# Patient Record
Sex: Female | Born: 1945
Health system: Southern US, Community
[De-identification: ages and names within clinical notes are randomized; demographics above are authoritative.]

## PROBLEM LIST (undated history)

## (undated) DIAGNOSIS — E871 Hypo-osmolality and hyponatremia: Secondary | ICD-10-CM

## (undated) DIAGNOSIS — M1990 Unspecified osteoarthritis, unspecified site: Secondary | ICD-10-CM

## (undated) DIAGNOSIS — F419 Anxiety disorder, unspecified: Secondary | ICD-10-CM

## (undated) DIAGNOSIS — Z9229 Personal history of other drug therapy: Secondary | ICD-10-CM

## (undated) DIAGNOSIS — H409 Unspecified glaucoma: Secondary | ICD-10-CM

## (undated) DIAGNOSIS — M810 Age-related osteoporosis without current pathological fracture: Secondary | ICD-10-CM

## (undated) DIAGNOSIS — I341 Nonrheumatic mitral (valve) prolapse: Secondary | ICD-10-CM

## (undated) DIAGNOSIS — K635 Polyp of colon: Secondary | ICD-10-CM

## (undated) DIAGNOSIS — C4491 Basal cell carcinoma of skin, unspecified: Secondary | ICD-10-CM

## (undated) DIAGNOSIS — J189 Pneumonia, unspecified organism: Secondary | ICD-10-CM

## (undated) DIAGNOSIS — D649 Anemia, unspecified: Secondary | ICD-10-CM

## (undated) DIAGNOSIS — M353 Polymyalgia rheumatica: Secondary | ICD-10-CM

## (undated) DIAGNOSIS — C50919 Malignant neoplasm of unspecified site of unspecified female breast: Secondary | ICD-10-CM

## (undated) DIAGNOSIS — E785 Hyperlipidemia, unspecified: Secondary | ICD-10-CM

## (undated) DIAGNOSIS — F1090 Alcohol use, unspecified, uncomplicated: Secondary | ICD-10-CM

## (undated) DIAGNOSIS — Z789 Other specified health status: Secondary | ICD-10-CM

## (undated) DIAGNOSIS — J45909 Unspecified asthma, uncomplicated: Secondary | ICD-10-CM

## (undated) DIAGNOSIS — IMO0002 Reserved for concepts with insufficient information to code with codable children: Secondary | ICD-10-CM

## (undated) DIAGNOSIS — K219 Gastro-esophageal reflux disease without esophagitis: Secondary | ICD-10-CM

## (undated) DIAGNOSIS — I201 Angina pectoris with documented spasm: Secondary | ICD-10-CM

## (undated) DIAGNOSIS — Z7289 Other problems related to lifestyle: Secondary | ICD-10-CM

## (undated) DIAGNOSIS — Z923 Personal history of irradiation: Secondary | ICD-10-CM

## (undated) DIAGNOSIS — I10 Essential (primary) hypertension: Secondary | ICD-10-CM

## (undated) HISTORY — DX: Unspecified asthma, uncomplicated: J45.909

## (undated) HISTORY — DX: Other specified health status: Z78.9

## (undated) HISTORY — DX: Hypo-osmolality and hyponatremia: E87.1

## (undated) HISTORY — DX: Anemia, unspecified: D64.9

## (undated) HISTORY — DX: Unspecified osteoarthritis, unspecified site: M19.90

## (undated) HISTORY — DX: Pneumonia, unspecified organism: J18.9

## (undated) HISTORY — DX: Angina pectoris with documented spasm: I20.1

## (undated) HISTORY — DX: Malignant neoplasm of unspecified site of unspecified female breast: C50.919

## (undated) HISTORY — DX: Polyp of colon: K63.5

## (undated) HISTORY — DX: Basal cell carcinoma of skin, unspecified: C44.91

## (undated) HISTORY — DX: Unspecified glaucoma: H40.9

## (undated) HISTORY — DX: Anxiety disorder, unspecified: F41.9

## (undated) HISTORY — DX: Reserved for concepts with insufficient information to code with codable children: IMO0002

## (undated) HISTORY — PX: HYSTEROTOMY: SHX1776

## (undated) HISTORY — DX: Alcohol use, unspecified, uncomplicated: F10.90

## (undated) HISTORY — DX: Personal history of other drug therapy: Z92.29

## (undated) HISTORY — DX: Age-related osteoporosis without current pathological fracture: M81.0

## (undated) HISTORY — PX: ABDOMINAL HYSTERECTOMY: SHX81

## (undated) HISTORY — DX: Nonrheumatic mitral (valve) prolapse: I34.1

## (undated) HISTORY — DX: Gastro-esophageal reflux disease without esophagitis: K21.9

## (undated) HISTORY — PX: POLYPECTOMY: SHX149

## (undated) HISTORY — DX: Hyperlipidemia, unspecified: E78.5

## (undated) HISTORY — DX: Essential (primary) hypertension: I10

## (undated) HISTORY — DX: Polymyalgia rheumatica: M35.3

## (undated) HISTORY — PX: LYMPH NODE DISSECTION: SHX5087

## (undated) HISTORY — DX: Other problems related to lifestyle: Z72.89

---

## 1996-01-25 DIAGNOSIS — Z923 Personal history of irradiation: Secondary | ICD-10-CM

## 1996-01-25 DIAGNOSIS — C50919 Malignant neoplasm of unspecified site of unspecified female breast: Secondary | ICD-10-CM

## 1996-01-25 DIAGNOSIS — C50512 Malignant neoplasm of lower-outer quadrant of left female breast: Secondary | ICD-10-CM | POA: Insufficient documentation

## 1996-01-25 HISTORY — PX: BREAST LUMPECTOMY: SHX2

## 1996-01-25 HISTORY — DX: Personal history of irradiation: Z92.3

## 1996-01-25 HISTORY — DX: Malignant neoplasm of unspecified site of unspecified female breast: C50.919

## 1996-01-25 HISTORY — DX: Malignant neoplasm of lower-outer quadrant of left female breast: C50.512

## 2003-01-25 HISTORY — PX: HERNIA REPAIR: SHX51

## 2012-07-24 HISTORY — PX: COLONOSCOPY: SHX174

## 2013-01-24 DIAGNOSIS — M1990 Unspecified osteoarthritis, unspecified site: Secondary | ICD-10-CM | POA: Insufficient documentation

## 2014-02-06 DIAGNOSIS — F328 Other depressive episodes: Secondary | ICD-10-CM | POA: Diagnosis not present

## 2014-02-06 DIAGNOSIS — F411 Generalized anxiety disorder: Secondary | ICD-10-CM | POA: Diagnosis not present

## 2014-02-19 DIAGNOSIS — R609 Edema, unspecified: Secondary | ICD-10-CM | POA: Diagnosis not present

## 2014-02-19 DIAGNOSIS — E780 Pure hypercholesterolemia: Secondary | ICD-10-CM | POA: Diagnosis not present

## 2014-02-20 DIAGNOSIS — F411 Generalized anxiety disorder: Secondary | ICD-10-CM | POA: Diagnosis not present

## 2014-02-20 DIAGNOSIS — F328 Other depressive episodes: Secondary | ICD-10-CM | POA: Diagnosis not present

## 2014-02-27 DIAGNOSIS — F411 Generalized anxiety disorder: Secondary | ICD-10-CM | POA: Diagnosis not present

## 2014-02-27 DIAGNOSIS — F328 Other depressive episodes: Secondary | ICD-10-CM | POA: Diagnosis not present

## 2014-03-05 DIAGNOSIS — E780 Pure hypercholesterolemia: Secondary | ICD-10-CM | POA: Diagnosis not present

## 2014-03-05 DIAGNOSIS — K219 Gastro-esophageal reflux disease without esophagitis: Secondary | ICD-10-CM | POA: Diagnosis not present

## 2014-03-05 DIAGNOSIS — R600 Localized edema: Secondary | ICD-10-CM | POA: Diagnosis not present

## 2014-03-05 DIAGNOSIS — I10 Essential (primary) hypertension: Secondary | ICD-10-CM | POA: Diagnosis not present

## 2014-03-12 DIAGNOSIS — R490 Dysphonia: Secondary | ICD-10-CM | POA: Diagnosis not present

## 2014-03-12 DIAGNOSIS — K219 Gastro-esophageal reflux disease without esophagitis: Secondary | ICD-10-CM | POA: Diagnosis not present

## 2014-03-12 DIAGNOSIS — L438 Other lichen planus: Secondary | ICD-10-CM | POA: Diagnosis not present

## 2014-03-12 DIAGNOSIS — D485 Neoplasm of uncertain behavior of skin: Secondary | ICD-10-CM | POA: Diagnosis not present

## 2014-03-12 DIAGNOSIS — R51 Headache: Secondary | ICD-10-CM | POA: Diagnosis not present

## 2014-03-12 DIAGNOSIS — J310 Chronic rhinitis: Secondary | ICD-10-CM | POA: Diagnosis not present

## 2014-03-13 DIAGNOSIS — F411 Generalized anxiety disorder: Secondary | ICD-10-CM | POA: Diagnosis not present

## 2014-03-13 DIAGNOSIS — F328 Other depressive episodes: Secondary | ICD-10-CM | POA: Diagnosis not present

## 2014-03-17 DIAGNOSIS — H40003 Preglaucoma, unspecified, bilateral: Secondary | ICD-10-CM | POA: Diagnosis not present

## 2014-03-17 DIAGNOSIS — Z961 Presence of intraocular lens: Secondary | ICD-10-CM | POA: Diagnosis not present

## 2014-03-27 DIAGNOSIS — M818 Other osteoporosis without current pathological fracture: Secondary | ICD-10-CM | POA: Diagnosis not present

## 2014-03-28 DIAGNOSIS — F411 Generalized anxiety disorder: Secondary | ICD-10-CM | POA: Diagnosis not present

## 2014-03-28 DIAGNOSIS — F328 Other depressive episodes: Secondary | ICD-10-CM | POA: Diagnosis not present

## 2014-04-09 DIAGNOSIS — M818 Other osteoporosis without current pathological fracture: Secondary | ICD-10-CM | POA: Diagnosis not present

## 2014-04-11 DIAGNOSIS — Z01419 Encounter for gynecological examination (general) (routine) without abnormal findings: Secondary | ICD-10-CM | POA: Diagnosis not present

## 2014-04-11 DIAGNOSIS — N952 Postmenopausal atrophic vaginitis: Secondary | ICD-10-CM | POA: Diagnosis not present

## 2014-04-11 DIAGNOSIS — R350 Frequency of micturition: Secondary | ICD-10-CM | POA: Diagnosis not present

## 2014-04-11 DIAGNOSIS — Z853 Personal history of malignant neoplasm of breast: Secondary | ICD-10-CM | POA: Diagnosis not present

## 2014-04-28 DIAGNOSIS — F328 Other depressive episodes: Secondary | ICD-10-CM | POA: Diagnosis not present

## 2014-04-28 DIAGNOSIS — F411 Generalized anxiety disorder: Secondary | ICD-10-CM | POA: Diagnosis not present

## 2014-05-01 DIAGNOSIS — K219 Gastro-esophageal reflux disease without esophagitis: Secondary | ICD-10-CM | POA: Diagnosis not present

## 2014-05-01 DIAGNOSIS — M150 Primary generalized (osteo)arthritis: Secondary | ICD-10-CM | POA: Diagnosis not present

## 2014-05-01 DIAGNOSIS — I10 Essential (primary) hypertension: Secondary | ICD-10-CM | POA: Diagnosis not present

## 2014-05-01 DIAGNOSIS — M0600 Rheumatoid arthritis without rheumatoid factor, unspecified site: Secondary | ICD-10-CM | POA: Diagnosis not present

## 2014-05-27 DIAGNOSIS — F328 Other depressive episodes: Secondary | ICD-10-CM | POA: Diagnosis not present

## 2014-05-27 DIAGNOSIS — F411 Generalized anxiety disorder: Secondary | ICD-10-CM | POA: Diagnosis not present

## 2014-06-24 DIAGNOSIS — Z853 Personal history of malignant neoplasm of breast: Secondary | ICD-10-CM | POA: Diagnosis not present

## 2014-07-01 DIAGNOSIS — I341 Nonrheumatic mitral (valve) prolapse: Secondary | ICD-10-CM | POA: Diagnosis not present

## 2014-07-01 DIAGNOSIS — E782 Mixed hyperlipidemia: Secondary | ICD-10-CM | POA: Diagnosis not present

## 2014-07-01 DIAGNOSIS — I10 Essential (primary) hypertension: Secondary | ICD-10-CM | POA: Diagnosis not present

## 2014-07-02 DIAGNOSIS — F411 Generalized anxiety disorder: Secondary | ICD-10-CM | POA: Diagnosis not present

## 2014-07-02 DIAGNOSIS — F328 Other depressive episodes: Secondary | ICD-10-CM | POA: Diagnosis not present

## 2014-08-04 DIAGNOSIS — D485 Neoplasm of uncertain behavior of skin: Secondary | ICD-10-CM | POA: Diagnosis not present

## 2014-08-04 DIAGNOSIS — L239 Allergic contact dermatitis, unspecified cause: Secondary | ICD-10-CM | POA: Diagnosis not present

## 2014-08-04 DIAGNOSIS — L438 Other lichen planus: Secondary | ICD-10-CM | POA: Diagnosis not present

## 2014-08-04 DIAGNOSIS — B078 Other viral warts: Secondary | ICD-10-CM | POA: Diagnosis not present

## 2014-08-07 DIAGNOSIS — F328 Other depressive episodes: Secondary | ICD-10-CM | POA: Diagnosis not present

## 2014-08-07 DIAGNOSIS — F411 Generalized anxiety disorder: Secondary | ICD-10-CM | POA: Diagnosis not present

## 2014-08-07 DIAGNOSIS — I10 Essential (primary) hypertension: Secondary | ICD-10-CM | POA: Diagnosis not present

## 2014-08-11 DIAGNOSIS — Z0001 Encounter for general adult medical examination with abnormal findings: Secondary | ICD-10-CM | POA: Diagnosis not present

## 2014-08-11 DIAGNOSIS — M353 Polymyalgia rheumatica: Secondary | ICD-10-CM | POA: Diagnosis not present

## 2014-08-11 DIAGNOSIS — E780 Pure hypercholesterolemia: Secondary | ICD-10-CM | POA: Diagnosis not present

## 2014-08-11 DIAGNOSIS — I10 Essential (primary) hypertension: Secondary | ICD-10-CM | POA: Diagnosis not present

## 2014-08-11 DIAGNOSIS — K219 Gastro-esophageal reflux disease without esophagitis: Secondary | ICD-10-CM | POA: Diagnosis not present

## 2014-08-11 DIAGNOSIS — R05 Cough: Secondary | ICD-10-CM | POA: Diagnosis not present

## 2014-08-18 DIAGNOSIS — E780 Pure hypercholesterolemia: Secondary | ICD-10-CM | POA: Diagnosis not present

## 2014-08-18 DIAGNOSIS — I10 Essential (primary) hypertension: Secondary | ICD-10-CM | POA: Diagnosis not present

## 2014-08-18 DIAGNOSIS — R312 Other microscopic hematuria: Secondary | ICD-10-CM | POA: Diagnosis not present

## 2014-08-18 DIAGNOSIS — R600 Localized edema: Secondary | ICD-10-CM | POA: Diagnosis not present

## 2014-08-18 DIAGNOSIS — Z0001 Encounter for general adult medical examination with abnormal findings: Secondary | ICD-10-CM | POA: Diagnosis not present

## 2014-08-22 DIAGNOSIS — R0602 Shortness of breath: Secondary | ICD-10-CM | POA: Diagnosis not present

## 2014-09-03 DIAGNOSIS — L239 Allergic contact dermatitis, unspecified cause: Secondary | ICD-10-CM | POA: Diagnosis not present

## 2014-09-05 DIAGNOSIS — L239 Allergic contact dermatitis, unspecified cause: Secondary | ICD-10-CM | POA: Diagnosis not present

## 2014-09-08 DIAGNOSIS — H40003 Preglaucoma, unspecified, bilateral: Secondary | ICD-10-CM | POA: Diagnosis not present

## 2014-10-01 DIAGNOSIS — F328 Other depressive episodes: Secondary | ICD-10-CM | POA: Diagnosis not present

## 2014-10-01 DIAGNOSIS — F411 Generalized anxiety disorder: Secondary | ICD-10-CM | POA: Diagnosis not present

## 2014-10-07 DIAGNOSIS — M818 Other osteoporosis without current pathological fracture: Secondary | ICD-10-CM | POA: Diagnosis not present

## 2014-10-09 DIAGNOSIS — M818 Other osteoporosis without current pathological fracture: Secondary | ICD-10-CM | POA: Diagnosis not present

## 2014-10-10 DIAGNOSIS — M818 Other osteoporosis without current pathological fracture: Secondary | ICD-10-CM | POA: Diagnosis not present

## 2014-10-13 DIAGNOSIS — M818 Other osteoporosis without current pathological fracture: Secondary | ICD-10-CM | POA: Diagnosis not present

## 2014-11-03 DIAGNOSIS — M0600 Rheumatoid arthritis without rheumatoid factor, unspecified site: Secondary | ICD-10-CM | POA: Diagnosis not present

## 2014-11-03 DIAGNOSIS — M810 Age-related osteoporosis without current pathological fracture: Secondary | ICD-10-CM | POA: Diagnosis not present

## 2014-11-03 DIAGNOSIS — M150 Primary generalized (osteo)arthritis: Secondary | ICD-10-CM | POA: Diagnosis not present

## 2014-11-03 DIAGNOSIS — M19249 Secondary osteoarthritis, unspecified hand: Secondary | ICD-10-CM | POA: Diagnosis not present

## 2014-11-20 DIAGNOSIS — Z85828 Personal history of other malignant neoplasm of skin: Secondary | ICD-10-CM | POA: Diagnosis not present

## 2014-11-20 DIAGNOSIS — L853 Xerosis cutis: Secondary | ICD-10-CM | POA: Diagnosis not present

## 2014-11-20 DIAGNOSIS — L570 Actinic keratosis: Secondary | ICD-10-CM | POA: Diagnosis not present

## 2014-11-20 DIAGNOSIS — L438 Other lichen planus: Secondary | ICD-10-CM | POA: Diagnosis not present

## 2014-11-20 DIAGNOSIS — L239 Allergic contact dermatitis, unspecified cause: Secondary | ICD-10-CM | POA: Diagnosis not present

## 2015-01-01 DIAGNOSIS — F411 Generalized anxiety disorder: Secondary | ICD-10-CM | POA: Diagnosis not present

## 2015-01-12 DIAGNOSIS — F411 Generalized anxiety disorder: Secondary | ICD-10-CM | POA: Diagnosis not present

## 2015-02-05 DIAGNOSIS — I10 Essential (primary) hypertension: Secondary | ICD-10-CM | POA: Diagnosis not present

## 2015-02-18 DIAGNOSIS — R600 Localized edema: Secondary | ICD-10-CM | POA: Diagnosis not present

## 2015-02-18 DIAGNOSIS — E784 Other hyperlipidemia: Secondary | ICD-10-CM | POA: Diagnosis not present

## 2015-02-25 DIAGNOSIS — K219 Gastro-esophageal reflux disease without esophagitis: Secondary | ICD-10-CM | POA: Diagnosis not present

## 2015-02-25 DIAGNOSIS — I10 Essential (primary) hypertension: Secondary | ICD-10-CM | POA: Diagnosis not present

## 2015-02-25 DIAGNOSIS — R600 Localized edema: Secondary | ICD-10-CM | POA: Diagnosis not present

## 2015-02-25 DIAGNOSIS — E784 Other hyperlipidemia: Secondary | ICD-10-CM | POA: Diagnosis not present

## 2015-03-25 ENCOUNTER — Other Ambulatory Visit: Payer: Self-pay | Admitting: Internal Medicine

## 2015-03-25 DIAGNOSIS — M542 Cervicalgia: Secondary | ICD-10-CM | POA: Diagnosis not present

## 2015-03-25 DIAGNOSIS — Z1231 Encounter for screening mammogram for malignant neoplasm of breast: Secondary | ICD-10-CM

## 2015-03-25 DIAGNOSIS — K219 Gastro-esophageal reflux disease without esophagitis: Secondary | ICD-10-CM | POA: Diagnosis not present

## 2015-03-25 DIAGNOSIS — M810 Age-related osteoporosis without current pathological fracture: Secondary | ICD-10-CM | POA: Diagnosis not present

## 2015-03-25 DIAGNOSIS — Z853 Personal history of malignant neoplasm of breast: Secondary | ICD-10-CM | POA: Diagnosis not present

## 2015-03-25 DIAGNOSIS — Z8739 Personal history of other diseases of the musculoskeletal system and connective tissue: Secondary | ICD-10-CM | POA: Diagnosis not present

## 2015-03-25 DIAGNOSIS — Z79899 Other long term (current) drug therapy: Secondary | ICD-10-CM | POA: Diagnosis not present

## 2015-03-25 DIAGNOSIS — I10 Essential (primary) hypertension: Secondary | ICD-10-CM | POA: Diagnosis not present

## 2015-03-25 DIAGNOSIS — Z5181 Encounter for therapeutic drug level monitoring: Secondary | ICD-10-CM | POA: Diagnosis not present

## 2015-03-31 DIAGNOSIS — H40052 Ocular hypertension, left eye: Secondary | ICD-10-CM | POA: Diagnosis not present

## 2015-03-31 DIAGNOSIS — H40051 Ocular hypertension, right eye: Secondary | ICD-10-CM | POA: Diagnosis not present

## 2015-04-09 DIAGNOSIS — M810 Age-related osteoporosis without current pathological fracture: Secondary | ICD-10-CM | POA: Diagnosis not present

## 2015-04-27 DIAGNOSIS — M810 Age-related osteoporosis without current pathological fracture: Secondary | ICD-10-CM | POA: Diagnosis not present

## 2015-04-27 DIAGNOSIS — M150 Primary generalized (osteo)arthritis: Secondary | ICD-10-CM | POA: Diagnosis not present

## 2015-04-27 DIAGNOSIS — M5030 Other cervical disc degeneration, unspecified cervical region: Secondary | ICD-10-CM | POA: Diagnosis not present

## 2015-04-27 DIAGNOSIS — M2550 Pain in unspecified joint: Secondary | ICD-10-CM | POA: Diagnosis not present

## 2015-05-20 DIAGNOSIS — L821 Other seborrheic keratosis: Secondary | ICD-10-CM | POA: Diagnosis not present

## 2015-05-20 DIAGNOSIS — L814 Other melanin hyperpigmentation: Secondary | ICD-10-CM | POA: Diagnosis not present

## 2015-05-20 DIAGNOSIS — D1801 Hemangioma of skin and subcutaneous tissue: Secondary | ICD-10-CM | POA: Diagnosis not present

## 2015-06-01 DIAGNOSIS — H40013 Open angle with borderline findings, low risk, bilateral: Secondary | ICD-10-CM | POA: Diagnosis not present

## 2015-06-29 ENCOUNTER — Ambulatory Visit
Admission: RE | Admit: 2015-06-29 | Discharge: 2015-06-29 | Disposition: A | Discharge status: Home / Self Care | Payer: Medicare Other | Source: Ambulatory Visit | Attending: Internal Medicine | Admitting: Internal Medicine

## 2015-06-29 ENCOUNTER — Other Ambulatory Visit: Payer: Self-pay | Admitting: Internal Medicine

## 2015-06-29 DIAGNOSIS — M1990 Unspecified osteoarthritis, unspecified site: Secondary | ICD-10-CM | POA: Diagnosis not present

## 2015-06-29 DIAGNOSIS — Z1231 Encounter for screening mammogram for malignant neoplasm of breast: Secondary | ICD-10-CM

## 2015-06-29 DIAGNOSIS — Z6823 Body mass index (BMI) 23.0-23.9, adult: Secondary | ICD-10-CM | POA: Diagnosis not present

## 2015-06-29 DIAGNOSIS — G4700 Insomnia, unspecified: Secondary | ICD-10-CM | POA: Diagnosis not present

## 2015-06-29 DIAGNOSIS — Z1389 Encounter for screening for other disorder: Secondary | ICD-10-CM | POA: Diagnosis not present

## 2015-06-29 DIAGNOSIS — Z853 Personal history of malignant neoplasm of breast: Secondary | ICD-10-CM

## 2015-06-29 DIAGNOSIS — I201 Angina pectoris with documented spasm: Secondary | ICD-10-CM | POA: Diagnosis not present

## 2015-06-29 DIAGNOSIS — Z86010 Personal history of colonic polyps: Secondary | ICD-10-CM | POA: Diagnosis not present

## 2015-06-29 DIAGNOSIS — J45909 Unspecified asthma, uncomplicated: Secondary | ICD-10-CM | POA: Diagnosis not present

## 2015-06-29 DIAGNOSIS — M353 Polymyalgia rheumatica: Secondary | ICD-10-CM | POA: Diagnosis not present

## 2015-06-29 DIAGNOSIS — K219 Gastro-esophageal reflux disease without esophagitis: Secondary | ICD-10-CM | POA: Diagnosis not present

## 2015-06-29 DIAGNOSIS — I10 Essential (primary) hypertension: Secondary | ICD-10-CM | POA: Diagnosis not present

## 2015-06-29 DIAGNOSIS — E871 Hypo-osmolality and hyponatremia: Secondary | ICD-10-CM | POA: Diagnosis not present

## 2015-06-29 DIAGNOSIS — E785 Hyperlipidemia, unspecified: Secondary | ICD-10-CM | POA: Diagnosis not present

## 2015-07-07 DIAGNOSIS — R252 Cramp and spasm: Secondary | ICD-10-CM | POA: Insufficient documentation

## 2015-07-07 DIAGNOSIS — I341 Nonrheumatic mitral (valve) prolapse: Secondary | ICD-10-CM | POA: Insufficient documentation

## 2015-07-07 DIAGNOSIS — IMO0002 Reserved for concepts with insufficient information to code with codable children: Secondary | ICD-10-CM | POA: Insufficient documentation

## 2015-07-07 DIAGNOSIS — I201 Angina pectoris with documented spasm: Secondary | ICD-10-CM | POA: Insufficient documentation

## 2015-07-08 DIAGNOSIS — H401122 Primary open-angle glaucoma, left eye, moderate stage: Secondary | ICD-10-CM | POA: Diagnosis not present

## 2015-07-15 ENCOUNTER — Ambulatory Visit (INDEPENDENT_AMBULATORY_CARE_PROVIDER_SITE_OTHER): Payer: Medicare Other | Admitting: Interventional Cardiology

## 2015-07-15 ENCOUNTER — Encounter: Payer: Self-pay | Admitting: Interventional Cardiology

## 2015-07-15 VITALS — BP 160/92 | HR 83 | Ht <= 58 in | Wt 107.8 lb

## 2015-07-15 DIAGNOSIS — I341 Nonrheumatic mitral (valve) prolapse: Secondary | ICD-10-CM | POA: Diagnosis not present

## 2015-07-15 DIAGNOSIS — I201 Angina pectoris with documented spasm: Secondary | ICD-10-CM

## 2015-07-15 DIAGNOSIS — E785 Hyperlipidemia, unspecified: Secondary | ICD-10-CM | POA: Diagnosis not present

## 2015-07-15 DIAGNOSIS — H9319 Tinnitus, unspecified ear: Secondary | ICD-10-CM

## 2015-07-15 DIAGNOSIS — E871 Hypo-osmolality and hyponatremia: Secondary | ICD-10-CM | POA: Diagnosis not present

## 2015-07-15 DIAGNOSIS — J4520 Mild intermittent asthma, uncomplicated: Secondary | ICD-10-CM

## 2015-07-15 DIAGNOSIS — K224 Dyskinesia of esophagus: Secondary | ICD-10-CM | POA: Insufficient documentation

## 2015-07-15 DIAGNOSIS — F32A Depression, unspecified: Secondary | ICD-10-CM

## 2015-07-15 DIAGNOSIS — I10 Essential (primary) hypertension: Secondary | ICD-10-CM | POA: Insufficient documentation

## 2015-07-15 DIAGNOSIS — J45909 Unspecified asthma, uncomplicated: Secondary | ICD-10-CM | POA: Insufficient documentation

## 2015-07-15 DIAGNOSIS — F329 Major depressive disorder, single episode, unspecified: Secondary | ICD-10-CM

## 2015-07-15 DIAGNOSIS — M810 Age-related osteoporosis without current pathological fracture: Secondary | ICD-10-CM | POA: Insufficient documentation

## 2015-07-15 HISTORY — DX: Dyskinesia of esophagus: K22.4

## 2015-07-15 HISTORY — DX: Essential (primary) hypertension: I10

## 2015-07-15 HISTORY — DX: Tinnitus, unspecified ear: H93.19

## 2015-07-15 HISTORY — DX: Depression, unspecified: F32.A

## 2015-07-15 NOTE — Patient Instructions (Signed)
Your physician wants you to follow-up in: [place] will receive a reminder letter in the mail two months in advance. If you don't receive a letter, please call our office to schedule the follow-up appointment.   If you need a refill on your cardiac medications before your next appointment, please call your pharmacy.

## 2015-07-15 NOTE — Progress Notes (Signed)
Cardiology Office Note    Date:  07/15/2015   ID:  Amanda Cannon, DOB May 19, 1945, MRN [id number]  PCP:  Velna Hatchet, MD  Cardiologist: Sinclair Grooms, MD   Chief Complaint  Patient presents with  . Cardiac Valve Problem    History of Present Illness:  Amanda Cannon is a 70 y.o. female with history of hypertension, hyperlipidemia, and remote history of mitral valve prolapse. There is also a history of hyperlipidemia.  Recently moved to [place] from [place] when she was taking care of by Dr. Verita Lamb. She has never had a diagnosis of coronary disease but does describe exertional tightness when walking up inclines. She gives a history of coronary vasospasm/Prinzmetal's but there is no documenting data to support this. He has been no change in her cardiopulmonary status in several years. She has multiple other medical problems for which she follows with a primary care physician. These problems include arthritis, back pain, glaucoma, and other issues.    Past Medical History  Diagnosis Date  . Prinzmetal angina ([place])   . Mitral valve prolapse   . Coronary artery spasm ([place])   . White matter changes     legions    Past Surgical History  Procedure Laterality Date  . Breast lumpectomy      cancer  . Lymph node dissection    . Hysterotomy    . Cesarean section      x 4    Current Medications: Outpatient Prescriptions Prior to Visit  Medication Sig Dispense Refill  . amLODipine (NORVASC) 2.5 MG tablet Take 2.5 mg by mouth daily.    Marland Kitchen aspirin 81 MG tablet Take 162 mg by mouth daily. Take two tablets of the 81 mg to make 162 mg daily    . valsartan (DIOVAN) 80 MG tablet Take 80 mg by mouth daily.    Marland Kitchen ALPRAZolam (XANAX) 1 MG tablet Take 1 mg by mouth at bedtime as needed for anxiety. Reported on 07/15/2015    . Furosemide (LASIX PO) Take 10 mg by mouth 2 (two) times a week.     No facility-administered medications prior to visit.     Allergies:   Tylenol     Social History   Social History  . Marital Status: Married    Spouse Name: N/A  . Number of Children: N/A  . Years of Education: N/A   Social History Main Topics  . Smoking status: Former Research scientist (life sciences)  . Smokeless tobacco: Never Used  . Alcohol Use: Not on file  . Drug Use: Not on file  . Sexual Activity: Not on file   Other Topics Concern  . Not on file   Social History Narrative     Family History:  The patient's family history includes Cancer - Ovarian in her sister; Coronary artery disease in her father and mother.   ROS:   Please see the history of present illness.    Decreased hearing, cough, nasal congestion, dyspnea on exertion, history of hemorrhoids and abdominal pain, occasional wheezing, joint swelling, lower back discomfort, muscle spasms and pain, recurring dizziness, occasional episodes of syncope.All other systems reviewed and are negative.   PHYSICAL EXAM:   VS:  BP 160/92 mmHg  Pulse 83  Ht 4' 8.5" (1.435 m)  Wt 107 lb 12.8 oz (48.898 kg)  BMI 23.75 kg/m2   GEN: Well nourished, well developed, in no acute distress HEENT: normal Neck: no JVD, carotid bruits, or masses Cardiac: Repeat blood pressure is 130/92 mmHg.  RRR; no murmurs,  clicks, rubs, or gallops,no edema .  Respiratory:  clear to auscultation bilaterally, normal work of breathing GI: soft, nontender, nondistended, + BS MS: no deformity or atrophy Skin: warm and dry, no rash Neuro:  Alert and Oriented x 3, Strength and sensation are intact Psych: euthymic mood, full affect  Wt Readings from Last 3 Encounters:  07/15/15 107 lb 12.8 oz (48.898 kg)      Studies/Labs Reviewed:   EKG:  EKG  Sinus rhythm at 83 bpm with nonspecific ST abnormality.   Current Labs: No results found for requested labs within last 365 days.   Lipid Panel No results found for: CHOL, TRIG, HDL, CHOLHDL, VLDL, LDLCALC, LDLDIRECT  Additional studies/ records that were reviewed today include:  Echocardiogram  12/09/13 revealed normal LV function with EF 60%, mild mitral valve prolapse with minimal mitral regurgitation, and aortic valve sclerosis.   Nuclear Perfusion study 08/06/13: Nuclear study demonstrated normal perfusion. Negative stress test without exertion related chest pain. Normal systolic function. No arrhythmia.  ASSESSMENT:    1. Mitral valve prolapse   2. Hyperlipidemia   3. Prinzmetal angina ([place])   4. Hyponatremia   5. Essential hypertension      PLAN:  In order of problems listed above:  1. Echo in [place] by Dr. Lurena Nida demonstrated mild mitral valve prolapse with normal LV function. Perhaps a mitral valve prolapse as a source of the patient's intermittent chest pain.  2.  Followed by primary care. Not currently on therapy.  3.  History of chest pain. Patient feels that she has Prinzmetal's angina. No documenting data although this problem was carried along on the problem list by Dr. Lurena Nida. 4. This problem is chronic. 5. Low-salt diet is recommended. Continue antihypertensive agents including amlodipine and valsartan.    Medication Adjustments/Labs and Tests Ordered: Current medicines are reviewed at length with the patient today.  Concerns regarding medicines are outlined above.  Medication changes, Labs and Tests ordered today are listed in the Patient Instructions below. Patient Instructions  Your physician wants you to follow-up in: [place] will receive a reminder letter in the mail two months in advance. If you don't receive a letter, please call our office to schedule the follow-up appointment.   If you need a refill on your cardiac medications before your next appointment, please call your pharmacy.     Signed, Sinclair Grooms, MD  07/15/2015 6:39 PM    [facility] [place], [place], [place]  [address] Phone: [phone]; Fax: [phone]

## 2015-08-03 DIAGNOSIS — M2550 Pain in unspecified joint: Secondary | ICD-10-CM | POA: Diagnosis not present

## 2015-08-03 DIAGNOSIS — M5030 Other cervical disc degeneration, unspecified cervical region: Secondary | ICD-10-CM | POA: Diagnosis not present

## 2015-08-03 DIAGNOSIS — M810 Age-related osteoporosis without current pathological fracture: Secondary | ICD-10-CM | POA: Diagnosis not present

## 2015-08-03 DIAGNOSIS — M150 Primary generalized (osteo)arthritis: Secondary | ICD-10-CM | POA: Diagnosis not present

## 2015-09-02 DIAGNOSIS — H401111 Primary open-angle glaucoma, right eye, mild stage: Secondary | ICD-10-CM | POA: Diagnosis not present

## 2015-09-30 DIAGNOSIS — H903 Sensorineural hearing loss, bilateral: Secondary | ICD-10-CM | POA: Diagnosis not present

## 2015-10-01 DIAGNOSIS — M810 Age-related osteoporosis without current pathological fracture: Secondary | ICD-10-CM | POA: Diagnosis not present

## 2015-10-12 DIAGNOSIS — M810 Age-related osteoporosis without current pathological fracture: Secondary | ICD-10-CM | POA: Diagnosis not present

## 2015-10-13 DIAGNOSIS — H40053 Ocular hypertension, bilateral: Secondary | ICD-10-CM | POA: Diagnosis not present

## 2015-11-02 DIAGNOSIS — M2550 Pain in unspecified joint: Secondary | ICD-10-CM | POA: Diagnosis not present

## 2015-11-02 DIAGNOSIS — M150 Primary generalized (osteo)arthritis: Secondary | ICD-10-CM | POA: Diagnosis not present

## 2015-11-02 DIAGNOSIS — M5030 Other cervical disc degeneration, unspecified cervical region: Secondary | ICD-10-CM | POA: Diagnosis not present

## 2015-11-02 DIAGNOSIS — M810 Age-related osteoporosis without current pathological fracture: Secondary | ICD-10-CM | POA: Diagnosis not present

## 2015-11-03 ENCOUNTER — Encounter: Payer: Self-pay | Admitting: Interventional Cardiology

## 2015-11-03 DIAGNOSIS — R8299 Other abnormal findings in urine: Secondary | ICD-10-CM | POA: Diagnosis not present

## 2015-11-03 DIAGNOSIS — M810 Age-related osteoporosis without current pathological fracture: Secondary | ICD-10-CM | POA: Diagnosis not present

## 2015-11-03 DIAGNOSIS — N390 Urinary tract infection, site not specified: Secondary | ICD-10-CM | POA: Diagnosis not present

## 2015-11-03 DIAGNOSIS — E784 Other hyperlipidemia: Secondary | ICD-10-CM | POA: Diagnosis not present

## 2015-11-03 DIAGNOSIS — I10 Essential (primary) hypertension: Secondary | ICD-10-CM | POA: Diagnosis not present

## 2015-11-10 DIAGNOSIS — Z23 Encounter for immunization: Secondary | ICD-10-CM | POA: Diagnosis not present

## 2015-11-10 DIAGNOSIS — Z1389 Encounter for screening for other disorder: Secondary | ICD-10-CM | POA: Diagnosis not present

## 2015-11-10 DIAGNOSIS — R87619 Unspecified abnormal cytological findings in specimens from cervix uteri: Secondary | ICD-10-CM | POA: Diagnosis not present

## 2015-11-10 DIAGNOSIS — E784 Other hyperlipidemia: Secondary | ICD-10-CM | POA: Diagnosis not present

## 2015-11-10 DIAGNOSIS — G4700 Insomnia, unspecified: Secondary | ICD-10-CM | POA: Diagnosis not present

## 2015-11-10 DIAGNOSIS — M353 Polymyalgia rheumatica: Secondary | ICD-10-CM | POA: Diagnosis not present

## 2015-11-10 DIAGNOSIS — M1990 Unspecified osteoarthritis, unspecified site: Secondary | ICD-10-CM | POA: Diagnosis not present

## 2015-11-10 DIAGNOSIS — Z0000 Encounter for general adult medical examination without abnormal findings: Secondary | ICD-10-CM | POA: Diagnosis not present

## 2015-11-10 DIAGNOSIS — E871 Hypo-osmolality and hyponatremia: Secondary | ICD-10-CM | POA: Diagnosis not present

## 2015-11-10 DIAGNOSIS — I10 Essential (primary) hypertension: Secondary | ICD-10-CM | POA: Diagnosis not present

## 2015-11-10 DIAGNOSIS — K219 Gastro-esophageal reflux disease without esophagitis: Secondary | ICD-10-CM | POA: Diagnosis not present

## 2015-11-10 DIAGNOSIS — C50919 Malignant neoplasm of unspecified site of unspecified female breast: Secondary | ICD-10-CM | POA: Diagnosis not present

## 2015-11-13 DIAGNOSIS — Z1212 Encounter for screening for malignant neoplasm of rectum: Secondary | ICD-10-CM | POA: Diagnosis not present

## 2016-01-04 ENCOUNTER — Other Ambulatory Visit: Payer: Self-pay | Admitting: Internal Medicine

## 2016-01-04 DIAGNOSIS — M810 Age-related osteoporosis without current pathological fracture: Secondary | ICD-10-CM

## 2016-01-04 DIAGNOSIS — R5381 Other malaise: Secondary | ICD-10-CM

## 2016-01-06 ENCOUNTER — Encounter (INDEPENDENT_AMBULATORY_CARE_PROVIDER_SITE_OTHER): Payer: Self-pay

## 2016-01-06 ENCOUNTER — Ambulatory Visit
Admission: RE | Admit: 2016-01-06 | Discharge: 2016-01-06 | Disposition: A | Discharge status: Home / Self Care | Payer: Medicare Other | Source: Ambulatory Visit | Attending: Internal Medicine | Admitting: Internal Medicine

## 2016-01-06 ENCOUNTER — Encounter: Payer: Self-pay | Admitting: Interventional Cardiology

## 2016-01-06 DIAGNOSIS — Z780 Asymptomatic menopausal state: Secondary | ICD-10-CM | POA: Diagnosis not present

## 2016-01-06 DIAGNOSIS — M810 Age-related osteoporosis without current pathological fracture: Secondary | ICD-10-CM

## 2016-01-06 DIAGNOSIS — M8589 Other specified disorders of bone density and structure, multiple sites: Secondary | ICD-10-CM | POA: Diagnosis not present

## 2016-01-07 ENCOUNTER — Encounter: Payer: Self-pay | Admitting: Interventional Cardiology

## 2016-01-15 DIAGNOSIS — Z6823 Body mass index (BMI) 23.0-23.9, adult: Secondary | ICD-10-CM | POA: Diagnosis not present

## 2016-01-15 DIAGNOSIS — R509 Fever, unspecified: Secondary | ICD-10-CM | POA: Diagnosis not present

## 2016-01-15 DIAGNOSIS — R05 Cough: Secondary | ICD-10-CM | POA: Diagnosis not present

## 2016-01-15 DIAGNOSIS — J069 Acute upper respiratory infection, unspecified: Secondary | ICD-10-CM | POA: Diagnosis not present

## 2016-01-17 DIAGNOSIS — J069 Acute upper respiratory infection, unspecified: Secondary | ICD-10-CM | POA: Diagnosis not present

## 2016-01-17 DIAGNOSIS — J029 Acute pharyngitis, unspecified: Secondary | ICD-10-CM | POA: Diagnosis not present

## 2016-02-02 DIAGNOSIS — M810 Age-related osteoporosis without current pathological fracture: Secondary | ICD-10-CM | POA: Diagnosis not present

## 2016-02-02 DIAGNOSIS — M5030 Other cervical disc degeneration, unspecified cervical region: Secondary | ICD-10-CM | POA: Diagnosis not present

## 2016-02-02 DIAGNOSIS — M2550 Pain in unspecified joint: Secondary | ICD-10-CM | POA: Diagnosis not present

## 2016-02-02 DIAGNOSIS — Z6822 Body mass index (BMI) 22.0-22.9, adult: Secondary | ICD-10-CM | POA: Diagnosis not present

## 2016-02-02 DIAGNOSIS — M150 Primary generalized (osteo)arthritis: Secondary | ICD-10-CM | POA: Diagnosis not present

## 2016-02-09 DIAGNOSIS — H40053 Ocular hypertension, bilateral: Secondary | ICD-10-CM | POA: Diagnosis not present

## 2016-03-01 ENCOUNTER — Telehealth: Payer: Self-pay | Admitting: Interventional Cardiology

## 2016-03-01 DIAGNOSIS — E785 Hyperlipidemia, unspecified: Secondary | ICD-10-CM

## 2016-03-01 NOTE — Telephone Encounter (Signed)
Pt wants to know if Dr. Tamala Julian would like any labs drawn prior to her appt in June.  She states currently no one is following her lipids.  Her cholesterol was elevated in Oct and she feels this needs to be checked again.  Will route to Dr .Tamala Julian for review and advisement.

## 2016-03-01 NOTE — Telephone Encounter (Signed)
New Message     Do you want her to have blood work before her and her husband appt, cholesterol was 268 the last time she had blood work

## 2016-03-02 NOTE — Telephone Encounter (Signed)
Informed pt of orders from Dr. Tamala Julian.  Pt verbalized understanding and was in agreement with this plan.  Labs scheduled for 06/24/16.

## 2016-03-02 NOTE — Telephone Encounter (Signed)
Needs Lipid NMR/Lipomed done fasting prior to OV.

## 2016-03-24 ENCOUNTER — Encounter: Payer: Self-pay | Admitting: Gastroenterology

## 2016-04-04 DIAGNOSIS — M810 Age-related osteoporosis without current pathological fracture: Secondary | ICD-10-CM | POA: Diagnosis not present

## 2016-04-11 DIAGNOSIS — H40053 Ocular hypertension, bilateral: Secondary | ICD-10-CM | POA: Diagnosis not present

## 2016-04-11 DIAGNOSIS — H5201 Hypermetropia, right eye: Secondary | ICD-10-CM | POA: Diagnosis not present

## 2016-04-11 DIAGNOSIS — H5000 Unspecified esotropia: Secondary | ICD-10-CM | POA: Diagnosis not present

## 2016-04-12 DIAGNOSIS — M810 Age-related osteoporosis without current pathological fracture: Secondary | ICD-10-CM | POA: Diagnosis not present

## 2016-04-13 DIAGNOSIS — H5000 Unspecified esotropia: Secondary | ICD-10-CM | POA: Diagnosis not present

## 2016-05-05 NOTE — Progress Notes (Signed)
HPI:  Amanda Cannon is here to establish care. New to the area.  Has the following chronic problems that require follow up and concerns today:  MVP/HTN/Prinzmetal angina/HLD/CAD: -lipids done 10/2015 - quite high -does not tolerate Lipitor, has not tried other statins -meds: norvasc, asa 81 mg, nexium 22m, lasix, diovan, ativan for prinzmetal dz - reports only take this for heat condition and cardiologist prescribed -regular exercise, diet is pretty good  Hx PMR/chronic muscle spasms/elevated CRP/ESR/Osteoporosis/Osteoarthritis: -seeing Dr. BAmil Amenq 616month-on prolia -lots of random aches and pains chronically   GERD/stomach aches: - sees GI next week -intermittent loose and hard stools -drinks several alcoholic drinks per day - I had difficulty getting her to really give me a number but she reports drinks scotch mixed with water as restricts water due to remote dx mild hyponatremia  Hx Breast ca: -1998 -neg braca per her report -s/p partial mastectomy L breast, hysterectomy  Seasonal allergies: -takes zyrtec and this helps  Depression: -remotely -fine since off bb - feels bb caused depression  ROS negative for unless reported above: fevers, unintentional weight loss, hearing or vision loss, chest pain, palpitations, struggling to breath, hemoptysis, melena, hematochezia, hematuria, falls, loc, si, thoughts of self harm  Past Medical History:  Diagnosis Date  . Alcohol use   . Anemia   . Basal cell carcinoma    forehead  . [place]   [place]   . GERD (gastroesophageal reflux disease)   . Glaucoma   . Hyperlipidemia   . Hypertension   . Hyponatremia    remote, mild  . Mitral valve prolapse   . Osteoarthritis    sees rheumatologist  . Osteoporosis    sees rheumatologist  . [facility]   sees rheumatologist  . Prinzmetal angina ([facility]  . White matter changes    ? ischemic sm vs per pt    Past Surgical History:   Procedure Laterality Date  . ABDOMINAL HYSTERECTOMY    . BREAST LUMPECTOMY     cancer  . CESAREAN SECTION     x 4  . HERNIA REPAIR  2005   abdominal hernia repair  . HYSTEROTOMY    . LYMPH NODE DISSECTION      Family History  Problem Relation Age of Onset  . Coronary artery disease Mother   . Coronary artery disease Father   . Cancer - Ovarian Sister     Social History   Social History  . Marital status: Married    Spouse name: N/A  . Number of children: N/A  . Years of education: N/A   Social History Main Topics  . Smoking status: Former SmResearch scientist (life sciences). Smokeless tobacco: Never Used  . Alcohol use None  . Drug use: Unknown  . Sexual activity: Not Asked   Other Topics Concern  . 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  amLODipine (NORVASC) 2.5 MG tablet, Take 2.5 mg by mouth daily., Disp: , Rfl:  .  aspirin 81 MG tablet, Take 162 mg by mouth daily. Take two tablets of the 81 mg to make 162 mg daily, Disp: , Rfl:  .  cetirizine (ZYRTEC) 10 MG  tablet, Take 10 mg by mouth 2 (two) times daily. , Disp: , Rfl:  .  denosumab (PROLIA) 60 MG/ML SOLN injection, Inject 60 mg into the skin every 6 (six) months. Administer in upper arm, thigh, or abdomen, Disp: , Rfl:  .  esomeprazole (NEXIUM) 20 MG capsule, Take 20 mg by mouth daily at 12 noon., Disp: , Rfl:  .  furosemide (LASIX) 20 MG tablet, Take 10 mg by mouth 3 (three) times a week., Disp: , Rfl:  .  IRON PO, Take 1 tablet by mouth daily., Disp: , Rfl:  .  LORazepam (ATIVAN) 1 MG tablet, Take 1 mg by mouth at bedtime as needed (spasms)., Disp: , Rfl:  .  MAGNESIUM PO, Take 1 tablet by mouth daily., Disp: , Rfl:  .  Multiple Vitamins-Minerals (MULTIVITAMIN PO), Take 1 tablet by mouth daily., Disp:  , Rfl:  .  POTASSIUM PO, Take 1 tablet by mouth daily., Disp: , Rfl:  .  SIMETHICONE PO, Take by mouth., Disp: , Rfl:  .  valsartan (DIOVAN) 80 MG tablet, Take 80 mg by mouth daily., Disp: , Rfl:   EXAM:  Vitals:   05/06/16 1042  BP: 118/80  Pulse: 95  Temp: 98.4 F (36.9 C)    Body mass index is 24.44 kg/m.  GENERAL: vitals reviewed and listed above, alert, oriented, appears well hydrated and in no acute distress  HEENT: atraumatic, conjunttiva clear, no obvious abnormalities on inspection of external nose and ears  NECK: no obvious masses on inspection  LUNGS: clear to auscultation bilaterally, no wheezes, rales or rhonchi, good air movement  CV: HRRR, no peripheral edema  MS: moves all extremities without noticeable abnormality  PSYCH: pleasant and cooperative, no obvious depression or anxiety  ASSESSMENT AND PLAN:  Discussed the following assessment and plan:  Hyperlipidemia, unspecified hyperlipidemia type  Prinzmetal angina (H[facility])  Mitral valve prolapse  Hyponatremia  Essential hypertension - Plan: Basic metabolic panel, CBC  Anemia, unspecified type - Plan: Vitamin B12  Hyperglycemia - Plan: Hemoglobin A1c  B12 deficiency - Plan: Vitamin B12 -We reviewed the PMH, PSH, FH, SH, Meds and Allergies. -We provided refills for any medications we will prescribe as needed. -labs per orders -lifestyle recs -talk about cholesterol at length, she plans to address with her cardiologist - did not want to try a different stating -advised to cut back on alcohol though she reports is not much -medicare exam in October -Patient advised to return or notify a doctor immediately if symptoms worsen or persist or new concerns arise.  Patient Instructions  BEFORE YOU LEAVE: -follow up: 1) Medicare visit with Manuela Schwartz in October -labs  Talk with cardiologist about the cholesterol.  We have ordered labs or studies at this visit. It can take up to 1-2 weeks for results and  processing. IF results require follow up or explanation, we will call you with instructions. Clinically stable results will be released to your [facility]. If you have not heard from [place] or cannot find your results in [facility] in 2 weeks please contact our office at [phone].  If you are not yet signed up for [facility], please consider signing up.  We recommend the following healthy lifestyle for LIFE: 1) Small portions.   Tip: eat off of a salad plate instead of a dinner plate.  Tip: It is ok to feel hungry after a meal.  Tip: if you need more or a snack choose fruits, veggies and/or a handful of nuts or seeds.  2) Eat  a healthy clean diet.  * Tip: Avoid (less then 1 serving per week): processed foods, sweets, sweetened drinks, white starches (rice, flour, bread, potatoes, pasta, etc), red meat, fast foods, butter  *Tip: CHOOSE instead   * 5-9 servings per day of fresh or frozen fruits and vegetables (but not corn, potatoes, bananas, canned or dried fruit)   *nuts and seeds, beans   *olives and olive oil   *small portions of lean meats such as fish and white chicken    *small portions of whole grains  3)Get at least 150 minutes of sweaty aerobic exercise per week.  4)Reduce stress - consider counseling, meditation and relaxation to balance other aspects of your life.           Colin Benton R.

## 2016-05-06 ENCOUNTER — Ambulatory Visit (INDEPENDENT_AMBULATORY_CARE_PROVIDER_SITE_OTHER): Payer: Medicare Other | Admitting: Family Medicine

## 2016-05-06 ENCOUNTER — Encounter: Payer: Self-pay | Admitting: Family Medicine

## 2016-05-06 VITALS — BP 118/80 | HR 95 | Temp 98.4°F | Ht <= 58 in | Wt 109.0 lb

## 2016-05-06 DIAGNOSIS — I341 Nonrheumatic mitral (valve) prolapse: Secondary | ICD-10-CM | POA: Diagnosis not present

## 2016-05-06 DIAGNOSIS — R739 Hyperglycemia, unspecified: Secondary | ICD-10-CM | POA: Diagnosis not present

## 2016-05-06 DIAGNOSIS — I10 Essential (primary) hypertension: Secondary | ICD-10-CM | POA: Diagnosis not present

## 2016-05-06 DIAGNOSIS — E871 Hypo-osmolality and hyponatremia: Secondary | ICD-10-CM

## 2016-05-06 DIAGNOSIS — I201 Angina pectoris with documented spasm: Secondary | ICD-10-CM | POA: Diagnosis not present

## 2016-05-06 DIAGNOSIS — E538 Deficiency of other specified B group vitamins: Secondary | ICD-10-CM | POA: Diagnosis not present

## 2016-05-06 DIAGNOSIS — E785 Hyperlipidemia, unspecified: Secondary | ICD-10-CM | POA: Diagnosis not present

## 2016-05-06 DIAGNOSIS — D649 Anemia, unspecified: Secondary | ICD-10-CM

## 2016-05-06 LAB — BASIC METABOLIC PANEL
BUN: 17 mg/dL (ref 6–23)
CALCIUM: 10 mg/dL (ref 8.4–10.5)
CHLORIDE: 95 meq/L — AB (ref 96–112)
CO2: 25 mEq/L (ref 19–32)
Creatinine, Ser: 0.75 mg/dL (ref 0.40–1.20)
GFR: 80.84 mL/min (ref >60.00)
Glucose, Bld: 103 mg/dL — ABNORMAL HIGH (ref 70–99)
Potassium: 4.4 mEq/L (ref 3.5–5.1)
Sodium: 131 mEq/L — ABNORMAL LOW (ref 135–145)

## 2016-05-06 LAB — CBC
HEMATOCRIT: 38 % (ref 36.0–46.0)
Hemoglobin: 12.9 g/dL (ref 12.0–15.0)
MCHC: 33.9 g/dL (ref 30.0–36.0)
MCV: 97.4 fl (ref 78.0–100.0)
Platelets: 351 10*3/uL (ref 150.0–400.0)
RBC: 3.9 Mil/uL (ref 3.87–5.11)
RDW: 13.6 % (ref 11.5–15.5)
WBC: 7.5 10*3/uL (ref 4.0–10.5)

## 2016-05-06 LAB — VITAMIN B12

## 2016-05-06 LAB — HEMOGLOBIN A1C: HEMOGLOBIN A1C: 5.6 % (ref 4.6–6.5)

## 2016-05-06 NOTE — Progress Notes (Signed)
Pre visit review using our clinic review tool, if applicable. No additional management support is needed unless otherwise documented below in the visit note. 

## 2016-05-06 NOTE — Patient Instructions (Addendum)
BEFORE YOU LEAVE: -follow up: 1) Medicare visit with Manuela Schwartz in October -labs  Talk with cardiologist about the cholesterol.  We have ordered labs or studies at this visit. It can take up to 1-2 weeks for results and processing. IF results require follow up or explanation, we will call you with instructions. Clinically stable results will be released to your [facility]. If you have not heard from [place] or cannot find your results in [facility] in 2 weeks please contact our office at [phone].  If you are not yet signed up for [facility], please consider signing up.  We recommend the following healthy lifestyle for LIFE: 1) Small portions.   Tip: eat off of a salad plate instead of a dinner plate.  Tip: It is ok to feel hungry after a meal.  Tip: if you need more or a snack choose fruits, veggies and/or a handful of nuts or seeds.  2) Eat a healthy clean diet.  * Tip: Avoid (less then 1 serving per week): processed foods, sweets, sweetened drinks, white starches (rice, flour, bread, potatoes, pasta, etc), red meat, fast foods, butter  *Tip: CHOOSE instead   * 5-9 servings per day of fresh or frozen fruits and vegetables (but not corn, potatoes, bananas, canned or dried fruit)   *nuts and seeds, beans   *olives and olive oil   *small portions of lean meats such as fish and white chicken    *small portions of whole grains  3)Get at least 150 minutes of sweaty aerobic exercise per week.  4)Reduce stress - consider counseling, meditation and relaxation to balance other aspects of your life.

## 2016-05-09 ENCOUNTER — Ambulatory Visit (INDEPENDENT_AMBULATORY_CARE_PROVIDER_SITE_OTHER): Payer: Medicare Other | Admitting: Gastroenterology

## 2016-05-09 ENCOUNTER — Encounter: Payer: Self-pay | Admitting: Gastroenterology

## 2016-05-09 VITALS — BP 132/72 | HR 88 | Ht <= 58 in | Wt 107.2 lb

## 2016-05-09 DIAGNOSIS — K219 Gastro-esophageal reflux disease without esophagitis: Secondary | ICD-10-CM | POA: Diagnosis not present

## 2016-05-09 DIAGNOSIS — H5203 Hypermetropia, bilateral: Secondary | ICD-10-CM | POA: Diagnosis not present

## 2016-05-09 DIAGNOSIS — R140 Abdominal distension (gaseous): Secondary | ICD-10-CM | POA: Diagnosis not present

## 2016-05-09 DIAGNOSIS — K5900 Constipation, unspecified: Secondary | ICD-10-CM | POA: Diagnosis not present

## 2016-05-09 DIAGNOSIS — I201 Angina pectoris with documented spasm: Secondary | ICD-10-CM | POA: Diagnosis not present

## 2016-05-09 DIAGNOSIS — H40053 Ocular hypertension, bilateral: Secondary | ICD-10-CM | POA: Diagnosis not present

## 2016-05-09 MED ORDER — ESOMEPRAZOLE MAGNESIUM 40 MG PO CPDR: 40.0000 mg | DELAYED_RELEASE_CAPSULE | Freq: Every day | ORAL | 11 refills | Status: DC

## 2016-05-09 MED ORDER — DICYCLOMINE HCL 10 MG PO CAPS: 10.0000 mg | ORAL_CAPSULE | Freq: Three times a day (TID) | ORAL | 11 refills | Status: DC

## 2016-05-09 NOTE — Patient Instructions (Signed)
We have sent the following medications to your pharmacy for you to pick up at your convenience: Nexium and Bentyl.  Follow up with Dr. Fuller Plan in 6 months and as needed.  Thank you for choosing me and [facility].  Pricilla Riffle. Dagoberto Ligas., MD., Marval Regal

## 2016-05-09 NOTE — Progress Notes (Signed)
History of Present Illness: This is a 71 year old female self referred for the evaluation of GERD and abdominal bloating. She moved here from [place] and states she underwent colonoscopy in 2015 for evaluation of hematochezia and a personal history of colon polyps. She relates hemorrhoids and colon polyps being found. She was advised to have a five-year colonoscopy. She states that she had an upper endoscopy performed in 2012 for GERD and does not recall the findings. She states she has taken Nexium 40 mg daily for 17 years and recently decreased this to Nexium over-the-counter 20 mg daily without good control of reflux symptoms. She has ongoing problems with postprandial abdominal bloating and constipation. She states magnesium tablets have worked well for her constipation. She has tried simethicone with some improvement in her bloating. CBC: normal. BMET: Na=131, Cl=95. Denies weight loss, diarrhea, change in stool caliber, melena, hematochezia, nausea, vomiting, dysphagia, chest pain.    Allergies  Allergen Reactions  . Tylenol [Acetaminophen] Cough  . Erythromycin     Stomach cramps  . Garlic Swelling    GI upset  . Other     Beta-blockers-patient states it makes her crazy  . Sulfa Antibiotics    Outpatient Medications Prior to Visit  Medication Sig Dispense Refill  . amLODipine (NORVASC) 2.5 MG tablet Take 2.5 mg by mouth daily.    Marland Kitchen aspirin 81 MG tablet Take 162 mg by mouth daily. Take two tablets of the 81 mg to make 162 mg daily    . cetirizine (ZYRTEC) 10 MG tablet Take 10 mg by mouth 2 (two) times daily.     Marland Kitchen denosumab (PROLIA) 60 MG/ML SOLN injection Inject 60 mg into the skin every 6 (six) months. Administer in upper arm, thigh, or abdomen    . esomeprazole (NEXIUM) 20 MG capsule Take 20 mg by mouth daily at 12 noon.    . furosemide (LASIX) 20 MG tablet Take 10 mg by mouth 3 (three) times a week.    . IRON PO Take 1 tablet by mouth daily.    Marland Kitchen LORazepam (ATIVAN) 1 MG tablet  Take 1 mg by mouth at bedtime as needed (spasms).    Marland Kitchen MAGNESIUM PO Take 1 tablet by mouth daily.    . Multiple Vitamins-Minerals (MULTIVITAMIN PO) Take 1 tablet by mouth daily.    Marland Kitchen POTASSIUM PO Take 1 tablet by mouth daily.    Marland Kitchen SIMETHICONE PO Take by mouth.    . valsartan (DIOVAN) 80 MG tablet Take 80 mg by mouth daily.     No facility-administered medications prior to visit.    Past Medical History:  Diagnosis Date  . Alcohol use   . Anemia   . Anxiety   . Asthma   . Basal cell carcinoma    forehead  . Breast cancer ([place]) 1998   left   . Colon polyps   . GERD (gastroesophageal reflux disease)   . Glaucoma   . Hyperlipidemia   . Hypertension   . Hyponatremia    remote, mild  . Mitral valve prolapse   . Osteoarthritis    sees rheumatologist  . Osteoporosis    sees rheumatologist  . PMR (polymyalgia rheumatica) [facility])    sees rheumatologist  . Pneumonia   . Prinzmetal angina ([place])   . White matter changes    ? ischemic sm vs per pt   Past Surgical History:  Procedure Laterality Date  . ABDOMINAL HYSTERECTOMY    . BREAST LUMPECTOMY Left  cancer  . CESAREAN SECTION     x 4  . HERNIA REPAIR  2005   abdominal hernia repair  . HYSTEROTOMY    . LYMPH NODE DISSECTION Left   . MASTECTOMY, PARTIAL Left    Social History   Social History  . Marital status: Married    Spouse name: N/A  . Number of children: 3  . Years of education: N/A   Occupational History  . volunteer    Social History Main Topics  . Smoking status: Former Smoker    Quit date: 01/25/1971  . Smokeless tobacco: Never Used  . Alcohol use 0.0 oz/week     Comment: one per day  . Drug use: No  . Sexual activity: Not Asked   Other Topics Concern  . 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 Coronary artery disease Mother   . Coronary artery disease Father   . Cancer - Ovarian Sister   . Colon cancer Cousin     x 2      Review of Systems: Pertinent positive and negative review of systems were noted in the above HPI section. All other review of systems were otherwise negative.   Physical Exam: General: Well developed, well nourished, no acute distress Head: Normocephalic and atraumatic Eyes:  sclerae anicteric, EOMI Ears: Normal auditory acuity Mouth: No deformity or lesions Neck: Supple, no masses or thyromegaly Lungs: Clear throughout to auscultation Heart: Regular rate and rhythm; no murmurs, rubs or bruits Abdomen: Soft, mild diffuse tenderness, more prominent in the lower abdomen and non distended. No masses, hepatosplenomegaly or hernias noted. Normal Bowel sounds Musculoskeletal: Symmetrical with no gross deformities  Skin: No lesions on visible extremities Pulses:  Normal pulses noted Extremities: No clubbing, cyanosis, edema or deformities noted Neurological: Alert oriented x 4, grossly nonfocal Cervical Nodes:  No significant cervical adenopathy Inguinal Nodes: No significant inguinal adenopathy Psychological:  Alert and cooperative. Normal mood and affect  Assessment and Recommendations:  1. GERD. Tender to antireflux measures and increase Nexium to 40 mg daily. Request records from prior EGD.  2. Constipation and abdominal bloating following meals. MiraLAX once daily. Dicyclomine 10 mg tid before meals and Gas-X 4 times a day when necessary. Consider Trulance if symptoms not improved. REV 6 months and sooner if symptoms not adequately controlled.   3. Personal history of colon polyps. Request records from prior colonoscopy including pathology reports.

## 2016-05-17 DIAGNOSIS — N952 Postmenopausal atrophic vaginitis: Secondary | ICD-10-CM | POA: Diagnosis not present

## 2016-05-17 DIAGNOSIS — N644 Mastodynia: Secondary | ICD-10-CM | POA: Diagnosis not present

## 2016-05-17 DIAGNOSIS — N905 Atrophy of vulva: Secondary | ICD-10-CM | POA: Diagnosis not present

## 2016-05-18 ENCOUNTER — Other Ambulatory Visit: Payer: Self-pay | Admitting: Obstetrics & Gynecology

## 2016-05-18 DIAGNOSIS — N644 Mastodynia: Secondary | ICD-10-CM

## 2016-05-23 DIAGNOSIS — L2089 Other atopic dermatitis: Secondary | ICD-10-CM | POA: Diagnosis not present

## 2016-05-23 DIAGNOSIS — L821 Other seborrheic keratosis: Secondary | ICD-10-CM | POA: Diagnosis not present

## 2016-05-23 DIAGNOSIS — Z85828 Personal history of other malignant neoplasm of skin: Secondary | ICD-10-CM | POA: Diagnosis not present

## 2016-05-23 DIAGNOSIS — L814 Other melanin hyperpigmentation: Secondary | ICD-10-CM | POA: Diagnosis not present

## 2016-05-23 DIAGNOSIS — D485 Neoplasm of uncertain behavior of skin: Secondary | ICD-10-CM | POA: Diagnosis not present

## 2016-05-23 DIAGNOSIS — L820 Inflamed seborrheic keratosis: Secondary | ICD-10-CM | POA: Diagnosis not present

## 2016-06-08 DIAGNOSIS — M2550 Pain in unspecified joint: Secondary | ICD-10-CM | POA: Diagnosis not present

## 2016-06-08 DIAGNOSIS — M5030 Other cervical disc degeneration, unspecified cervical region: Secondary | ICD-10-CM | POA: Diagnosis not present

## 2016-06-08 DIAGNOSIS — M150 Primary generalized (osteo)arthritis: Secondary | ICD-10-CM | POA: Diagnosis not present

## 2016-06-08 DIAGNOSIS — Z6823 Body mass index (BMI) 23.0-23.9, adult: Secondary | ICD-10-CM | POA: Diagnosis not present

## 2016-06-08 DIAGNOSIS — M810 Age-related osteoporosis without current pathological fracture: Secondary | ICD-10-CM | POA: Diagnosis not present

## 2016-06-17 ENCOUNTER — Encounter: Payer: Medicare Other | Admitting: Gastroenterology

## 2016-06-24 ENCOUNTER — Telehealth: Payer: Self-pay | Admitting: Interventional Cardiology

## 2016-06-24 ENCOUNTER — Other Ambulatory Visit: Payer: Medicare Other | Admitting: *Deleted

## 2016-06-24 DIAGNOSIS — I10 Essential (primary) hypertension: Secondary | ICD-10-CM

## 2016-06-24 DIAGNOSIS — E785 Hyperlipidemia, unspecified: Secondary | ICD-10-CM

## 2016-06-24 NOTE — Telephone Encounter (Signed)
Walk in pt Form- pt dropped off labs. Placed in [place] box.

## 2016-06-25 LAB — APOLIPOPROTEIN B: APOLIPOPROTEIN B: 137 mg/dL — AB (ref 54–133)

## 2016-06-25 LAB — NMR, LIPOPROFILE
CHOLESTEROL: 254 mg/dL — AB (ref 100–199)
HDL Cholesterol by NMR: 74 mg/dL (ref >39)
HDL Particle Number: 51.3 umol/L (ref >=30.5)
LDL Particle Number: 1632 nmol/L — ABNORMAL HIGH (ref <1000)
LDL SIZE: 21.5 nm (ref >20.5)
LDL-C: 145 mg/dL — ABNORMAL HIGH (ref 0–99)
LP-IR SCORE: 51 — AB (ref <=45)
SMALL LDL PARTICLE NUMBER: 542 nmol/L — AB (ref <=527)
TRIGLYCERIDES BY NMR: 174 mg/dL — AB (ref 0–149)

## 2016-06-25 LAB — APOLIPOPROTEIN A-1: Apolipoprotein A-1: 202 mg/dL (ref 116–209)

## 2016-07-06 ENCOUNTER — Telehealth: Payer: Self-pay

## 2016-07-06 ENCOUNTER — Ambulatory Visit (INDEPENDENT_AMBULATORY_CARE_PROVIDER_SITE_OTHER): Payer: Medicare Other | Admitting: Interventional Cardiology

## 2016-07-06 ENCOUNTER — Encounter: Payer: Self-pay | Admitting: Interventional Cardiology

## 2016-07-06 ENCOUNTER — Ambulatory Visit
Admission: RE | Admit: 2016-07-06 | Discharge: 2016-07-06 | Disposition: A | Discharge status: Home / Self Care | Payer: Medicare Other | Source: Ambulatory Visit | Attending: Obstetrics & Gynecology | Admitting: Obstetrics & Gynecology

## 2016-07-06 VITALS — BP 150/88 | HR 94 | Ht <= 58 in | Wt 109.8 lb

## 2016-07-06 DIAGNOSIS — I341 Nonrheumatic mitral (valve) prolapse: Secondary | ICD-10-CM | POA: Diagnosis not present

## 2016-07-06 DIAGNOSIS — N644 Mastodynia: Secondary | ICD-10-CM

## 2016-07-06 DIAGNOSIS — I10 Essential (primary) hypertension: Secondary | ICD-10-CM

## 2016-07-06 DIAGNOSIS — E785 Hyperlipidemia, unspecified: Secondary | ICD-10-CM | POA: Diagnosis not present

## 2016-07-06 DIAGNOSIS — N6489 Other specified disorders of breast: Secondary | ICD-10-CM | POA: Diagnosis not present

## 2016-07-06 DIAGNOSIS — I201 Angina pectoris with documented spasm: Secondary | ICD-10-CM | POA: Diagnosis not present

## 2016-07-06 DIAGNOSIS — R928 Other abnormal and inconclusive findings on diagnostic imaging of breast: Secondary | ICD-10-CM | POA: Diagnosis not present

## 2016-07-06 HISTORY — DX: Personal history of irradiation: Z92.3

## 2016-07-06 MED ORDER — AMLODIPINE BESYLATE 5 MG PO TABS: 5.0000 mg | ORAL_TABLET | Freq: Every day | ORAL | 3 refills | Status: DC

## 2016-07-06 NOTE — Patient Instructions (Addendum)
Medication Instructions:  1) DECREASE Aspirin to 81mg  once daily. 2) INCREASE Amlodipine to 5mg  once daily. Labwork: None  Testing/Procedures: None  Follow-Up: Your physician recommends that you schedule a follow-up appointment next available with [facility].  Your physician wants you to follow-up in: 1 year with Dr. Tamala Julian.  You will receive a reminder letter in the mail two months in advance. If you don't receive a letter, please call our office to schedule the follow-up appointment.    Any Other Special Instructions Will Be Listed Below (If Applicable).     If you need a refill on your cardiac medications before your next appointment, please call your pharmacy.

## 2016-07-06 NOTE — Progress Notes (Signed)
Cardiology Office Note    Date:  07/06/2016   ID:  Amanda Cannon, DOB November 27, 1945, MRN [id number]  PCP:  Lucretia Kern, DO  Cardiologist: Sinclair Grooms, MD   Chief Complaint  Patient presents with  . Follow-up    1 year; hyperlipidemia    History of Present Illness:  Amanda Cannon is a 71 y.o. female with history of hypertension, hyperlipidemia, and remote history of mitral valve prolapse.   She has occasional tightness in her chest. Chest tightness is nonexertional. Recurring episodes of face pain but no jaw pain. She has not needed nitroglycerin for Prinzmetal's episodes. She does have some difficulty with anxiety. She has switched primary care physicians to Dr. Colin Benton. She is unable to tolerate statin therapy (atorvastatin) due to neuropathy. She has tinnitus and hearing loss. Occasional palpitations. Occasional quick jaw pain.  Past Medical History:  Diagnosis Date  . Alcohol use   . Anemia   . Anxiety   . Asthma   . Basal cell carcinoma    forehead  . Breast cancer ([place]) 1998   left   . Colon polyps   . GERD (gastroesophageal reflux disease)   . Glaucoma   . Hyperlipidemia   . Hypertension   . Hyponatremia    remote, mild  . Mitral valve prolapse   . Osteoarthritis    sees rheumatologist  . Osteoporosis    sees rheumatologist  . PMR (polymyalgia rheumatica) [facility])    sees rheumatologist  . Pneumonia   . Prinzmetal angina ([place])   . White matter changes    ? ischemic sm vs per pt    Past Surgical History:  Procedure Laterality Date  . ABDOMINAL HYSTERECTOMY    . BREAST LUMPECTOMY Left    cancer  . CESAREAN SECTION     x 4  . HERNIA REPAIR  2005   abdominal hernia repair  . HYSTEROTOMY    . LYMPH NODE DISSECTION Left   . MASTECTOMY, PARTIAL Left     Current Medications: Outpatient Medications Prior to Visit  Medication Sig Dispense Refill  . aspirin 81 MG tablet Take 81 mg by mouth daily.    . cetirizine (ZYRTEC) 10 MG tablet Take 10  mg by mouth 2 (two) times daily.     Amanda Cannon Kitchen denosumab (PROLIA) 60 MG/ML SOLN injection Inject 60 mg into the skin every 6 (six) months. Administer in upper arm, thigh, or abdomen    . dicyclomine (BENTYL) 10 MG capsule Take 1 capsule (10 mg total) by mouth 3 (three) times daily before meals. 90 capsule 11  . esomeprazole (NEXIUM) 40 MG capsule Take 1 capsule (40 mg total) by mouth daily. 30 capsule 11  . furosemide (LASIX) 20 MG tablet Take 10 mg by mouth 3 (three) times a week.    . IRON PO Take 1 tablet by mouth daily.    Amanda Cannon Kitchen LORazepam (ATIVAN) 1 MG tablet Take 1 mg by mouth at bedtime as needed (spasms).    Amanda Cannon Kitchen MAGNESIUM PO Take 1 tablet by mouth daily.    . Multiple Vitamins-Minerals (MULTIVITAMIN PO) Take 1 tablet by mouth daily.    Amanda Cannon Kitchen POTASSIUM PO Take 1 tablet by mouth daily.    . valsartan (DIOVAN) 80 MG tablet Take 80 mg by mouth daily.    . vitamin B-12 (CYANOCOBALAMIN) 1000 MCG tablet Take 1,000 mcg by mouth daily.    Amanda Cannon Kitchen amLODipine (NORVASC) 2.5 MG tablet Take 2.5 mg by mouth daily.    Amanda Cannon Kitchen SIMETHICONE  PO Take by mouth.     No facility-administered medications prior to visit.      Allergies:   Tylenol [acetaminophen]; Erythromycin; Erythromycin base; Garlic; Other; and Sulfa antibiotics   Social History   Social History  . Marital status: Married    Spouse name: N/A  . Number of children: 3  . Years of education: N/A   Occupational History  . volunteer    Social History Main Topics  . Smoking status: Former Smoker    Quit date: 01/25/1971  . Smokeless tobacco: Never Used  . Alcohol use 0.0 oz/week     Comment: one per day  . Drug use: No  . Sexual activity: Not Asked   Other Topics Concern  . 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   ROS:   Please see the history of present illness.    History of hyponatremia. History of ischemic brain lesions for which she was placed on aspirin years ago. Attempted therapy with statins led to neuropathy. Occasional difficulty with diarrhea, abdominal bloating, and dizziness. History of diarrhea. History of hearing loss.  All other systems reviewed and are negative.   PHYSICAL EXAM:   VS:  BP (!) 150/88   Pulse 94   Ht 4' 8.5" (1.435 m)   Wt 109 lb 12.8 oz (49.8 kg)   BMI 24.18 kg/m    GEN: Well nourished, well developed, in no acute distress  HEENT: normal  Neck: no JVD, carotid bruits, or masses Cardiac: RRR , with no rubs, or gallops,no edema . Apical systolic murmur with slight radiation into the left axilla. Respiratory:  clear to auscultation bilaterally, normal work of breathing GI: soft, nontender, nondistended, + BS MS: no deformity or atrophy  Skin: warm and dry, no rash Neuro:  Alert and Oriented x 3, Strength and sensation are intact Psych: euthymic mood, full affect  Wt Readings from Last 3 Encounters:  07/06/16 109 lb 12.8 oz (49.8 kg)  05/09/16 107 lb 4 oz (48.6 kg)  05/06/16 109 lb (49.4 kg)      Studies/Labs Reviewed:   EKG:  EKG  Normal sinus rhythm, nonspecific ST abnormality. No significant change compared to prior.  Recent Labs: 05/06/2016: BUN 17; Creatinine, Ser 0.75; Hemoglobin 12.9; Platelets 351.0; Potassium 4.4; Sodium 131   Lipid Panel    Component Value Date/Time   CHOL 254 (H) 06/24/2016 0848   TRIG 174 (H) 06/24/2016 0848   HDL 74 06/24/2016 0848    Additional studies/ records that were reviewed today include:  Echocardiogram 12/09/13 revealed normal LV function with EF 60%, mild mitral valve prolapse with minimal mitral regurgitation, and aortic valve sclerosis.    Nuclear Perfusion study 08/06/13: Nuclear study  demonstrated normal perfusion. Negative stress test without exertion related chest pain. Normal systolic function. No arrhythmia.     ASSESSMENT:    1. Hyperlipidemia with target LDL less than 70   2. Essential hypertension   3. Prinzmetal angina ([place])   4. Mitral valve prolapse      PLAN:  In order of problems listed  above:  1. Lipid clinic evaluation. Last evaluation was with an advanced lipid profile where she had greater than 1600 LDL particles which were relatively large. She has a history of ischemic brain lesions. I believe we need to control LDL as much as possible. She has lipid therapy intolerance (atorvastatin). 2. Increase amlodipine to 5 mg per day 3. Increasing amlodipine to 5 mg per day will help care for his protection against Prinzmetal's. 4. No significant auscultatory evidence of mitral valve disease. A soft apical systolic murmur is noted.  Plan low-salt diet, aerobic activity, increased blood pressure control, and referral to lipid clinic. Clinical follow-up in one year.  Medication Adjustments/Labs and Tests Ordered: Current medicines are reviewed at length with the patient today.  Concerns regarding medicines are outlined above.  Medication changes, Labs and Tests ordered today are listed in the Patient Instructions below. Patient Instructions  Medication Instructions:  1) DECREASE Aspirin to 81mg  once daily.  Labwork: None  Testing/Procedures: None  Follow-Up: Your physician recommends that you schedule a follow-up appointment next available with Lipid Clinic.  Your physician wants you to follow-up in: 1 year with Dr. Tamala Julian.  You will receive a reminder letter in the mail two months in advance. If you don't receive a letter, please call our office to schedule the follow-up appointment.    Any Other Special Instructions Will Be Listed Below (If Applicable).     If you need a refill on your cardiac medications before your next appointment, please  call your pharmacy.      Signed, Sinclair Grooms, MD  07/06/2016 9:38 AM    [facility] [place], [place], [place]  [address] Phone: [phone]; Fax: [phone]

## 2016-07-06 NOTE — Telephone Encounter (Signed)
Patient called to request refill on Lorazepam. Pt states that "no one else will give it to her" and she has been advised to contact you. She is requesting 2mg  tablets at a 90d supply. I advised pt that her chart shows 1mg  tabs, and she said she "would take whatever you will give her". Pt advised Dr. Maudie Mercury not in office until next week and she said that was fine.   Dr. Maudie Mercury - Please advise. Thanks!

## 2016-07-08 ENCOUNTER — Ambulatory Visit (INDEPENDENT_AMBULATORY_CARE_PROVIDER_SITE_OTHER): Payer: Medicare Other | Admitting: Pharmacist

## 2016-07-08 DIAGNOSIS — E7801 Familial hypercholesterolemia: Secondary | ICD-10-CM | POA: Diagnosis not present

## 2016-07-08 DIAGNOSIS — I201 Angina pectoris with documented spasm: Secondary | ICD-10-CM

## 2016-07-08 MED ORDER — ROSUVASTATIN CALCIUM 5 MG PO TABS: 5.0000 mg | ORAL_TABLET | Freq: Every day | ORAL | 11 refills | Status: DC

## 2016-07-08 NOTE — Progress Notes (Signed)
Patient ID: Amanda Cannon                 DOB: Aug 11, 1945                    MRN: [id number]     HPI: Amanda Cannon is a 71 y.o. female patient referred to lipid clinic by Dr. Tamala Julian. PMH is significant for Prinzmetal angina, hypertension, hyperlipidemia, and remote history of mitral valve prolapse. History of ischemic brain lesions per Dr. Thompson Caul note on 07/06/16. Per patient, a neurologist told her that these areas were indicative of strokes based on MRI imaging. She is asymptomatic except for dizziness. It has been "years" since an MRI was repeated. Dr. Tamala Julian reviewed her advance lipid panel and determined her to be at low to moderate risk for heart disease in the future.   Patient presents today for lipid clinic establishment. She is currently not on any lipid-lowering medications. She has previously tried atorvastatin 10mg  daily, but developed severe muscle cramps in the legs with neuropathy. This hindered her ability to walk. She reported trying atorvastatin down to 5mg  once weekly, and was able to tolerate this dose. She did not stay on it long-term because she states that no provider seemed worried about her cholesterol. She endorses a strong family history of atherosclerosis, stating that it was the cause of death for her mother and father, who passed 1 month apart. She also states that her daughter (age 21) had a dental xray done and was told she has plaque in her carotid arteries. Her main concern with high cholesterol was the risk of stroke/TIA/dementia. We discussed the benefit of LDL-lowering on this risk, and the medications that are used to lower LDL.  Patient has decent medical knowledge. 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 However, she spent ~10 minutes describing the benefits of alcohol use on cholesterol. We did discuss her elevated triglycerides and how alcohol directly impacts this number.  A few  other notes: at the beginning of the appointment, her weight was taken (109.2 lbs) and she was upset stating 100 lbs is a good weight for her. Patient also stated preference for dealing with the same healthcare professional, and asked to meet Georgina Peer today when I mentioned I would not be here for her next appointment.   Current Lipid Medications:  None  Intolerances: atorvastatin 5mg  - severe muscle cramps in legs w/ neuropathy, couldn't walk  Risk Factors: age, HTN, HLD, angina, ischemic brain lesions - ASCVD 10-yr risk 20%   LDL goal: < 70 mg/dL due to elevated apoB, self-reported ischemic lesions, and symptomatic angina   Diet: Drinks at least 1.5 servings of liquor each night (bourbon w/ water and cognac).   Exercise: Beginning to start light-impact exercise with husband (who has spinal stenosis).    Family History: The patient's family history includes Cancer - Ovarian in her sister; Colon cancer in her cousin; Coronary artery disease in her father and mother (41, 40 passed away 1 month apart).   Social History: Former smoker (quit [id number]), denies illicit drug use. 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   Labs: 06/24/2016 - TC 254, TG 174, HDL 51, LDL 145, LDL-particle 1632, ApoA-1 202, ApoB 137 (on no meds)  Past Medical History:  Diagnosis Date  . Alcohol use   . Anemia   . Anxiety   . Asthma   .  Basal cell carcinoma    forehead  . Breast cancer ([place]) 1998   left   . Colon polyps   . GERD (gastroesophageal reflux disease)   . Glaucoma   . Hyperlipidemia   . Hypertension   . Hyponatremia    remote, mild  . Mitral valve prolapse   . Osteoarthritis    sees rheumatologist  . Osteoporosis    sees rheumatologist  . Personal history of radiation therapy 1998  . PMR (polymyalgia rheumatica) ([facility])    sees rheumatologist  . Pneumonia   . Prinzmetal angina ([place])   . White matter changes    ? ischemic sm vs per pt    Current  Outpatient Prescriptions on File Prior to Visit  Medication Sig Dispense Refill  . amLODipine (NORVASC) 5 MG tablet Take 1 tablet (5 mg total) by mouth daily. 90 tablet 3  . aspirin 81 MG tablet Take 81 mg by mouth daily.    . cetirizine (ZYRTEC) 10 MG tablet Take 10 mg by mouth 2 (two) times daily.     Marland Kitchen denosumab (PROLIA) 60 MG/ML SOLN injection Inject 60 mg into the skin every 6 (six) months. Administer in upper arm, thigh, or abdomen    . dicyclomine (BENTYL) 10 MG capsule Take 1 capsule (10 mg total) by mouth 3 (three) times daily before meals. 90 capsule 11  . esomeprazole (NEXIUM) 40 MG capsule Take 1 capsule (40 mg total) by mouth daily. 30 capsule 11  . furosemide (LASIX) 20 MG tablet Take 10 mg by mouth 3 (three) times a week.    . IRON PO Take 1 tablet by mouth daily.    Marland Kitchen LORazepam (ATIVAN) 1 MG tablet Take 1 mg by mouth at bedtime as needed (spasms).    Marland Kitchen MAGNESIUM PO Take 1 tablet by mouth daily.    . Multiple Vitamins-Minerals (MULTIVITAMIN PO) Take 1 tablet by mouth daily.    Marland Kitchen POTASSIUM PO Take 1 tablet by mouth daily.    . valsartan (DIOVAN) 80 MG tablet Take 80 mg by mouth daily.    . vitamin B-12 (CYANOCOBALAMIN) 1000 MCG tablet Take 1,000 mcg by mouth daily.     No current facility-administered medications on file prior to visit.     Allergies  Allergen Reactions  . Tylenol [Acetaminophen] Cough  . Erythromycin     Stomach cramps  . Erythromycin Base Other (See Comments)    Stomach issues  . Garlic Swelling    GI upset  . Other     Beta-blockers-patient states it makes her crazy  . Sulfa Antibiotics Other (See Comments)    Doesn't recall    Assessment/Plan:  1. Hyperlipidemia - LDL above goal of < 70 mg/dL. Discussed the differences between atorvastatin and rosuvastatin in muscle side effects, and the importance of trying another statin agent before PCSK9 inhibitors could be a potential option. We compromised on initiating rosuvastatin 5mg  three times a week  with the plan to increase to daily if tolerated for 2 weeks. Follow-up with [facility] in 8 weeks with clinic appointment the next day.   Belia Heman, PharmD PGY1 Resident 07/08/2016 10:14 AM  Patient seen with: Fuller Canada, PharmD, CPP, [place] [address] [place], [place] [address] Phone: [phone]; Fax: [phone]

## 2016-07-08 NOTE — Telephone Encounter (Signed)
Please let her know, I don't rx controlled medications. Would be happy to see her to discuss and perhaps provide a short amount(bridge) or taper as an exception, but I do not rx in adults more then a short course.

## 2016-07-08 NOTE — Patient Instructions (Addendum)
It was great to meet you today!  Please start Crestor 5mg  three times a week. If tolerated for 2 weeks, increase dose to once daily. If muscle cramps occur, please call the clinic.   Follow-up in 8 weeks with a lipid panel and visit on August 10th

## 2016-07-08 NOTE — Telephone Encounter (Signed)
I left a message for the pt to return my call. 

## 2016-07-11 NOTE — Telephone Encounter (Signed)
Spoke to the Amanda Cannon and informed her that Dr. Maudie Mercury would not be able to prescribe long term but may be able to prescribe a bridge of medication until she can get the Amanda Cannon in to see someone who will prescribe long term.  Amanda Cannon agreed and has scheduled an appt to come in and discuss on 07/14/16.

## 2016-07-14 ENCOUNTER — Encounter: Payer: Self-pay | Admitting: Family Medicine

## 2016-07-14 ENCOUNTER — Ambulatory Visit (INDEPENDENT_AMBULATORY_CARE_PROVIDER_SITE_OTHER): Payer: Medicare Other | Admitting: Family Medicine

## 2016-07-14 VITALS — BP 122/76 | HR 95 | Temp 98.3°F | Wt 109.5 lb

## 2016-07-14 DIAGNOSIS — I201 Angina pectoris with documented spasm: Secondary | ICD-10-CM

## 2016-07-14 NOTE — Patient Instructions (Signed)
Please follow up with cardiology about your angina.  I do not prescribe controlled medications, including Ativan, for regular use in adult patients. I provided you with one small refill of Ativan as a bridge while you consider other options. We have discussed risks and interactions.  Do not drink alcohol while on this medication.  Lorazepam tablets What is this medicine? LORAZEPAM (lor A ze pam) is a benzodiazepine. It is used to treat anxiety. This medicine may be used for other purposes; ask your health care provider or pharmacist if you have questions. COMMON BRAND NAME(S): Ativan What should I tell my health care provider before I take this medicine? They need to know if you have any of these conditions: -glaucoma -history of drug or alcohol abuse problem -kidney disease -liver disease -lung or breathing disease, like asthma -mental illness -myasthenia gravis -Parkinson's disease -suicidal thoughts, plans, or attempt; a previous suicide attempt by you or a family member -an unusual or allergic reaction to lorazepam, other medicines, foods, dyes, or preservatives -pregnant or trying to get pregnant -breast-feeding How should I use this medicine? Take this medicine by mouth with a glass of water. Follow the directions on the prescription label. Take your medicine at regular intervals. Do not take it more often than directed. Do not stop taking except on your doctor's advice. A special MedGuide will be given to you by the pharmacist with each prescription and refill. Be sure to read this information carefully each time. Talk to your pediatrician regarding the use of this medicine in children. While this drug may be used in children as young as 12 years for selected conditions, precautions do apply. Overdosage: If you think you have taken too much of this medicine contact a poison control center or emergency room at once. NOTE: This medicine is only for you. Do not share this medicine  with others. What if I miss a dose? If you miss a dose, take it as soon as you can. If it is almost time for your next dose, take only that dose. Do not take double or extra doses. What may interact with this medicine? Do not take this medicine with any of the following medications: -narcotic medicines for cough -sodium oxybate This medicine may also interact with the following medications: -alcohol -antihistamines for allergy, cough and cold -certain medicines for anxiety or sleep -certain medicines for depression, like amitriptyline, fluoxetine, sertraline -certain medicines for seizures like carbamazepine, phenobarbital, phenytoin, primidone -general anesthetics like lidocaine, pramoxine, tetracaine -MAOIs like Carbex, Eldepryl, Marplan, Nardil, and Parnate -medicines that relax muscles for surgery -narcotic medicines for pain -phenothiazines like chlorpromazine, mesoridazine, prochlorperazine, thioridazine This list may not describe all possible interactions. Give your health care provider a list of all the medicines, herbs, non-prescription drugs, or dietary supplements you use. Also tell them if you smoke, drink alcohol, or use illegal drugs. Some items may interact with your medicine. What should I watch for while using this medicine? Tell your doctor or health care professional if your symptoms do not start to get better or if they get worse. Do not stop taking except on your doctor's advice. You may develop a severe reaction. Your doctor will tell you how much medicine to take. You may get drowsy or dizzy. Do not drive, use machinery, or do anything that needs mental alertness until you know how this medicine affects you. To reduce the risk of dizzy and fainting spells, do not stand or sit up quickly, especially if you are an older  patient. Alcohol may increase dizziness and drowsiness. Avoid alcoholic drinks. If you are taking another medicine that also causes drowsiness, you may  have more side effects. Give your health care provider a list of all medicines you use. Your doctor will tell you how much medicine to take. Do not take more medicine than directed. Call emergency for help if you have problems breathing or unusual sleepiness. What side effects may I notice from receiving this medicine? Side effects that you should report to your doctor or health care professional as soon as possible: -allergic reactions like skin rash, itching or hives, swelling of the face, lips, or tongue -breathing problems -confusion -loss of balance or coordination -signs and symptoms of low blood pressure like dizziness; feeling faint or lightheaded, falls; unusually weak or tired -suicidal thoughts or other mood changes Side effects that usually do not require medical attention (report to your doctor or health care professional if they continue or are bothersome): -dizziness -headache -nausea, vomiting -tiredness This list may not describe all possible side effects. Call your doctor for medical advice about side effects. You may report side effects to FDA at 1-800-FDA-1088. Where should I keep my medicine? Keep out of the reach of children. This medicine can be abused. Keep your medicine in a safe place to protect it from theft. Do not share this medicine with anyone. Selling or giving away this medicine is dangerous and against the law. This medicine may cause accidental overdose and death if taken by other adults, children, or pets. Mix any unused medicine with a substance like cat litter or coffee grounds. Then throw the medicine away in a sealed container like a sealed bag or a coffee can with a lid. Do not use the medicine after the expiration date. Store at room temperature between 20 and 25 degrees C (68 and 77 degrees F). Protect from light. Keep container tightly closed. NOTE: This sheet is a summary. It may not cover all possible information. If you have questions about this  medicine, talk to your doctor, pharmacist, or health care provider.  2018 Elsevier/Gold Standard (2014-10-09 15:54:27)

## 2016-07-14 NOTE — Progress Notes (Signed)
HPI:  Acute visit for medication refill high risk medication.Reported hx of prinzmetal angina and reports her prior cardiologist rxd ativan for this. Reports her new cardiologist was very rushed at her visit and they did not discuss this and he refused to fill it when she asked later. She plans to schedule appt with his PA - but wants short term refill in interim. Knows I do not rx controlled medications. Reports bad anginal symptoms when misses this medication. Does drink alcohol. Denies any complications or issues with taking this medication in the past.  ROS: See pertinent positives and negatives per HPI.  Past Medical History:  Diagnosis Date  . Alcohol use   . Anemia   . Anxiety   . Asthma   . Basal cell carcinoma    forehead  . Breast cancer ([place]) 1998   left   . Colon polyps   . GERD (gastroesophageal reflux disease)   . Glaucoma   . Hyperlipidemia   . Hypertension   . Hyponatremia    remote, mild  . Mitral valve prolapse   . Osteoarthritis    sees rheumatologist  . Osteoporosis    sees rheumatologist  . Personal history of radiation therapy 1998  . PMR (polymyalgia rheumatica) (HCC)    sees rheumatologist  . Pneumonia   . Prinzmetal angina ([place])   . White matter changes    ? ischemic sm vs per pt    Past Surgical History:  Procedure Laterality Date  . ABDOMINAL HYSTERECTOMY    . BREAST LUMPECTOMY Left 1998   cancer  . CESAREAN SECTION     x 4  . HERNIA REPAIR  2005   abdominal hernia repair  . HYSTEROTOMY    . LYMPH NODE DISSECTION Left   . MASTECTOMY, PARTIAL Left     Family History  Problem Relation Age of Onset  . Coronary artery disease Mother   . Coronary artery disease Father   . Cancer - Ovarian Sister   . Colon cancer Cousin        x 2    Social History   Social History  . Marital status: Married    Spouse name: N/A  . Number of children: 3  . Years of education: N/A   Occupational History  . volunteer    Social History Main  Topics  . Smoking status: Former Smoker    Quit date: 01/25/1971  . Smokeless tobacco: Never Used  . Alcohol use 0.0 oz/week     Comment: one per day  . Drug use: No  . Sexual activity: Not Asked   Other Topics Concern  . 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  amLODipine (NORVASC) 5 MG tablet, Take 1 tablet (5 mg total) by mouth daily., Disp: 90 tablet, Rfl: 3 .  aspirin 81 MG tablet, Take 81 mg by mouth daily., Disp: , Rfl:  .  cetirizine (ZYRTEC) 10 MG tablet, Take 10 mg by mouth 2 (two) times daily. , Disp: , Rfl:  .  denosumab (PROLIA) 60 MG/ML SOLN injection, Inject 60 mg into the skin every 6 (six) months. Administer in upper arm, thigh, or abdomen, Disp: , Rfl:  .  dicyclomine (BENTYL)  10 MG capsule, Take 1 capsule (10 mg total) by mouth 3 (three) times daily before meals., Disp: 90 capsule, Rfl: 11 .  esomeprazole (NEXIUM) 40 MG capsule, Take 1 capsule (40 mg total) by mouth daily., Disp: 30 capsule, Rfl: 11 .  furosemide (LASIX) 20 MG tablet, Take 10 mg by mouth 3 (three) times a week., Disp: , Rfl:  .  IRON PO, Take 1 tablet by mouth daily., Disp: , Rfl:  .  LORazepam (ATIVAN) 1 MG tablet, Take 1 mg by mouth at bedtime as needed (spasms)., Disp: , Rfl:  .  MAGNESIUM PO, Take 1 tablet by mouth daily., Disp: , Rfl:  .  Multiple Vitamins-Minerals (MULTIVITAMIN PO), Take 1 tablet by mouth daily., Disp: , Rfl:  .  POTASSIUM PO, Take 1 tablet by mouth daily., Disp: , Rfl:  .  rosuvastatin (CRESTOR) 5 MG tablet, Take 1 tablet (5 mg total) by mouth daily., Disp: 30 tablet, Rfl: 11 .  valsartan (DIOVAN) 80 MG tablet, Take 80 mg by mouth daily., Disp: , Rfl:  .  vitamin B-12 (CYANOCOBALAMIN) 1000 MCG tablet, Take 1,000 mcg by mouth daily., Disp: , Rfl:    EXAM:  Vitals:   07/14/16 1433  BP: 122/76  Pulse: 95  Temp: 98.3 F (36.8 C)    Body mass index is 24.12 kg/m.  GENERAL: vitals reviewed and listed above, alert, oriented, appears well hydrated and in no acute distress  HEENT: atraumatic, conjunttiva clear, no obvious abnormalities on inspection of external nose and ears  NECK: no obvious masses on inspection  LUNGS: clear to auscultation bilaterally, no wheezes, rales or rhonchi, good air movement  CV: HRRR, no peripheral edema  MS: moves all extremities without noticeable abnormality  PSYCH: pleasant and cooperative, no obvious depression or anxiety  ASSESSMENT AND PLAN:  Discussed the following assessment and plan:  Prinzmetal angina (HCC)  -I am not myself experienced in the use of ativan for prinzmetal angina -advised can provide a one time small amount (30 days) refill as bridge until she can discuss this or other options for treatment of her angina with her cardiologist (paper written rx provided).  -advised not to use with alcohol or other sedative  Patient Instructions  Please follow up with cardiology about your angina.  I do not prescribe controlled medications, including Ativan, for regular use in adult patients. I provided you with one small refill of Ativan as a bridge while you consider other options. We have discussed risks and interactions.  Do not drink alcohol while on this medication.  Lorazepam tablets What is this medicine? LORAZEPAM (lor A ze pam) is a benzodiazepine. It is used to treat anxiety. This medicine may be used for other purposes; ask your health care provider or pharmacist if you have questions. COMMON BRAND NAME(S): Ativan What should I tell my health care provider before I take this medicine? They need to know if you have any of these conditions: -glaucoma -history of drug or alcohol abuse problem -kidney disease -liver disease -lung or breathing disease, like  asthma -mental illness -myasthenia gravis -Parkinson's disease -suicidal thoughts, plans, or attempt; a previous suicide attempt by you or a family member -an unusual or allergic reaction to lorazepam, other medicines, foods, dyes, or preservatives -pregnant or trying to get pregnant -breast-feeding How should I use this medicine? Take this medicine by mouth with a glass of water. Follow the directions on the prescription label. Take your medicine at regular intervals. Do not take it more often  than directed. Do not stop taking except on your doctor's advice. A special MedGuide will be given to you by the pharmacist with each prescription and refill. Be sure to read this information carefully each time. Talk to your pediatrician regarding the use of this medicine in children. While this drug may be used in children as young as 12 years for selected conditions, precautions do apply. Overdosage: If you think you have taken too much of this medicine contact a poison control center or emergency room at once. NOTE: This medicine is only for you. Do not share this medicine with others. What if I miss a dose? If you miss a dose, take it as soon as you can. If it is almost time for your next dose, take only that dose. Do not take double or extra doses. What may interact with this medicine? Do not take this medicine with any of the following medications: -narcotic medicines for cough -sodium oxybate This medicine may also interact with the following medications: -alcohol -antihistamines for allergy, cough and cold -certain medicines for anxiety or sleep -certain medicines for depression, like amitriptyline, fluoxetine, sertraline -certain medicines for seizures like carbamazepine, phenobarbital, phenytoin, primidone -general anesthetics like lidocaine, pramoxine, tetracaine -MAOIs like Carbex, Eldepryl, Marplan, Nardil, and Parnate -medicines that relax muscles for surgery -narcotic medicines for  pain -phenothiazines like chlorpromazine, mesoridazine, prochlorperazine, thioridazine This list may not describe all possible interactions. Give your health care provider a list of all the medicines, herbs, non-prescription drugs, or dietary supplements you use. Also tell them if you smoke, drink alcohol, or use illegal drugs. Some items may interact with your medicine. What should I watch for while using this medicine? Tell your doctor or health care professional if your symptoms do not start to get better or if they get worse. Do not stop taking except on your doctor's advice. You may develop a severe reaction. Your doctor will tell you how much medicine to take. You may get drowsy or dizzy. Do not drive, use machinery, or do anything that needs mental alertness until you know how this medicine affects you. To reduce the risk of dizzy and fainting spells, do not stand or sit up quickly, especially if you are an older patient. Alcohol may increase dizziness and drowsiness. Avoid alcoholic drinks. If you are taking another medicine that also causes drowsiness, you may have more side effects. Give your health care provider a list of all medicines you use. Your doctor will tell you how much medicine to take. Do not take more medicine than directed. Call emergency for help if you have problems breathing or unusual sleepiness. What side effects may I notice from receiving this medicine? Side effects that you should report to your doctor or health care professional as soon as possible: -allergic reactions like skin rash, itching or hives, swelling of the face, lips, or tongue -breathing problems -confusion -loss of balance or coordination -signs and symptoms of low blood pressure like dizziness; feeling faint or lightheaded, falls; unusually weak or tired -suicidal thoughts or other mood changes Side effects that usually do not require medical attention (report to your doctor or health care professional  if they continue or are bothersome): -dizziness -headache -nausea, vomiting -tiredness This list may not describe all possible side effects. Call your doctor for medical advice about side effects. You may report side effects to FDA at 1-800-FDA-1088. Where should I keep my medicine? Keep out of the reach of children. This medicine can be abused. Keep your  medicine in a safe place to protect it from theft. Do not share this medicine with anyone. Selling or giving away this medicine is dangerous and against the law. This medicine may cause accidental overdose and death if taken by other adults, children, or pets. Mix any unused medicine with a substance like cat litter or coffee grounds. Then throw the medicine away in a sealed container like a sealed bag or a coffee can with a lid. Do not use the medicine after the expiration date. Store at room temperature between 20 and 25 degrees C (68 and 77 degrees F). Protect from light. Keep container tightly closed. NOTE: This sheet is a summary. It may not cover all possible information. If you have questions about this medicine, talk to your doctor, pharmacist, or health care provider.  2018 Elsevier/Gold Standard (2014-10-09 15:54:27)      Colin Benton R., DO

## 2016-07-15 ENCOUNTER — Telehealth: Payer: Self-pay | Admitting: Family Medicine

## 2016-07-15 NOTE — Telephone Encounter (Signed)
Spoke with pharmacy stated that they clarified the Rx and refilled the medications.

## 2016-07-15 NOTE — Telephone Encounter (Signed)
Pharm has question concerning ativan rx

## 2016-07-18 ENCOUNTER — Telehealth: Payer: Self-pay | Admitting: Interventional Cardiology

## 2016-07-18 MED ORDER — EZETIMIBE 10 MG PO TABS: 10.0000 mg | ORAL_TABLET | Freq: Every day | ORAL | 1 refills | Status: DC

## 2016-07-18 NOTE — Telephone Encounter (Signed)
Started taking Crestor every other day last week then increased to 3 days straight as doing ok.   She has had neuropathy in left leg, weakness in legs and minor spasms in legs since starting the Crestor. She soes not wish to continue with statin therapy. She is not interested in injectable therapy currently and would prefer to try Zetia 10mg  daily for now. She will do a 2 week wash out period and then start zetia 10mg  daily.   She has follow up scheduled. Advised to call with any issues in the meantime. She states understanding and appreciation.

## 2016-07-18 NOTE — Telephone Encounter (Signed)
New message    Pt is calling asking for a call back from [place].

## 2016-08-02 NOTE — Progress Notes (Deleted)
Cardiology Office Note    Date:  08/02/2016   ID:  Amanda Cannon, DOB 1945/03/30, MRN [id number]  PCP:  Lucretia Kern, DO  Cardiologist:  Dr. Tamala Julian   CC: wants ativan filled for printzmetal angina  History of Present Illness:  Amanda Cannon is a 71 y.o. female with a history of HTN, HLD, MVP, CVAs by imaging and prinzmetal angina who presents to clinic for ativan refills.  She moved to [place] and established care with Dr. Tamala Julian in 06/2015. Per Dr. Jema Deegan Caul note "she has never had a diagnosis of coronary disease but does describe exertional tightness when walking up inclines. She gives a history of coronary vasospasm/Prinzmetal's but there is no documenting data to support this."   She was last seen by Dr. Tamala Julian on 07/06/16 for follow up. She was noted to have a history of ischemic brain lesions with a MRI done remotely that showed ischemic areas c/w previous CVAs. She has a history of statin intolerance and was set up in the lipid clinic for evaluation. She saw [facility] and was trailed on    Past Medical History:  Diagnosis Date  . Alcohol use   . Anemia   . Anxiety   . Asthma   . Basal cell carcinoma    forehead  . Breast cancer ([place]) 1998   left   . Colon polyps   . GERD (gastroesophageal reflux disease)   . Glaucoma   . Hyperlipidemia   . Hypertension   . Hyponatremia    remote, mild  . Mitral valve prolapse   . Osteoarthritis    sees rheumatologist  . Osteoporosis    sees rheumatologist  . Personal history of radiation therapy 1998  . PMR (polymyalgia rheumatica) (HCC)    sees rheumatologist  . Pneumonia   . Prinzmetal angina ([place])   . White matter changes    ? ischemic sm vs per pt    Past Surgical History:  Procedure Laterality Date  . ABDOMINAL HYSTERECTOMY    . BREAST LUMPECTOMY Left 1998   cancer  . CESAREAN SECTION     x 4  . HERNIA REPAIR  2005   abdominal hernia repair  . HYSTEROTOMY    . LYMPH NODE DISSECTION Left   .  MASTECTOMY, PARTIAL Left     Current Medications: Outpatient Medications Prior to Visit  Medication Sig Dispense Refill  . amLODipine (NORVASC) 5 MG tablet Take 1 tablet (5 mg total) by mouth daily. 90 tablet 3  . aspirin 81 MG tablet Take 81 mg by mouth daily.    . cetirizine (ZYRTEC) 10 MG tablet Take 10 mg by mouth 2 (two) times daily.     Marland Kitchen denosumab (PROLIA) 60 MG/ML SOLN injection Inject 60 mg into the skin every 6 (six) months. Administer in upper arm, thigh, or abdomen    . dicyclomine (BENTYL) 10 MG capsule Take 1 capsule (10 mg total) by mouth 3 (three) times daily before meals. 90 capsule 11  . esomeprazole (NEXIUM) 40 MG capsule Take 1 capsule (40 mg total) by mouth daily. 30 capsule 11  . ezetimibe (ZETIA) 10 MG tablet Take 1 tablet (10 mg total) by mouth daily. 30 tablet 1  . furosemide (LASIX) 20 MG tablet Take 10 mg by mouth 3 (three) times a week.    . IRON PO Take 1 tablet by mouth daily.    Marland Kitchen LORazepam (ATIVAN) 1 MG tablet Take 1 mg by mouth at bedtime as needed (spasms).    Marland Kitchen  MAGNESIUM PO Take 1 tablet by mouth daily.    . Multiple Vitamins-Minerals (MULTIVITAMIN PO) Take 1 tablet by mouth daily.    Marland Kitchen POTASSIUM PO Take 1 tablet by mouth daily.    . rosuvastatin (CRESTOR) 5 MG tablet Take 1 tablet (5 mg total) by mouth daily. 30 tablet 11  . valsartan (DIOVAN) 80 MG tablet Take 80 mg by mouth daily.    . vitamin B-12 (CYANOCOBALAMIN) 1000 MCG tablet Take 1,000 mcg by mouth daily.     No facility-administered medications prior to visit.      Allergies:   Tylenol [acetaminophen]; Erythromycin; Erythromycin base; Garlic; Other; and Sulfa antibiotics   Social History   Social History  . Marital status: Married    Spouse name: N/A  . Number of children: 3  . Years of education: N/A   Occupational History  . volunteer    Social History Main Topics  . Smoking status: Former Smoker    Quit date: 01/25/1971  . Smokeless tobacco: Never Used  . Alcohol use 0.0 oz/week      Comment: one per day  . Drug use: No  . Sexual activity: Not on file   Other Topics Concern  . 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      *** ROS/PE    Wt Readings from Last 3 Encounters:  07/14/16 109 lb 8 oz (49.7 kg)  07/06/16 109 lb 12.8 oz (49.8 kg)  05/09/16 107 lb 4 oz (48.6 kg)      Studies/Labs Reviewed:   EKG:  EKG is*** ordered today.  The ekg ordered today demonstrates ***  Recent Labs: 05/06/2016: BUN 17; Creatinine, Ser 0.75; Hemoglobin 12.9; Platelets 351.0; Potassium 4.4; Sodium 131   Lipid Panel    Component Value Date/Time   CHOL 254 (H) 06/24/2016 0848   TRIG 174 (H) 06/24/2016 0848   HDL 74 06/24/2016 0848    Additional studies/ records that were reviewed today include:  ***   ASSESSMENT & PLAN:       Medication Adjustments/Labs and Tests Ordered: Current medicines are reviewed at length with the patient today.  Concerns regarding medicines are outlined above.  Medication changes, Labs and Tests ordered today are listed in the Patient Instructions below. There are no Patient Instructions on file for this visit.   Signed, Angelena Form, PA-C  08/02/2016 8:24 PM    [facility] [address], [place], [place]  [address] Phone: [phone]; Fax: [phone]

## 2016-08-03 ENCOUNTER — Telehealth: Payer: Self-pay

## 2016-08-03 ENCOUNTER — Telehealth: Payer: Self-pay | Admitting: Family Medicine

## 2016-08-03 ENCOUNTER — Ambulatory Visit: Payer: Medicare Other | Admitting: Physician Assistant

## 2016-08-03 NOTE — Telephone Encounter (Signed)
error 

## 2016-08-03 NOTE — Telephone Encounter (Signed)
08/03/16 1735 (late entry 08/03/2016 AM)  Per request from [place] contacted patient to discuss upcoming appointment later today.  Pt explained her reason for appointment was to get a prescription for her ativan which has been taking for several years to relieve the symptoms if Prinzmetal angina.  Per Joellen [place] there is no research to support prescribing ativan for prinzmetal angina and this is not a medication prescribed by cardiology.  Once explained to pt she decided to cancel her appointment. Georgana Curio MHA RN CCM

## 2016-08-05 DIAGNOSIS — H40053 Ocular hypertension, bilateral: Secondary | ICD-10-CM | POA: Diagnosis not present

## 2016-08-08 DIAGNOSIS — Z6823 Body mass index (BMI) 23.0-23.9, adult: Secondary | ICD-10-CM | POA: Diagnosis not present

## 2016-08-08 DIAGNOSIS — M5030 Other cervical disc degeneration, unspecified cervical region: Secondary | ICD-10-CM | POA: Diagnosis not present

## 2016-08-08 DIAGNOSIS — M810 Age-related osteoporosis without current pathological fracture: Secondary | ICD-10-CM | POA: Diagnosis not present

## 2016-08-08 DIAGNOSIS — M2550 Pain in unspecified joint: Secondary | ICD-10-CM | POA: Diagnosis not present

## 2016-08-08 DIAGNOSIS — M150 Primary generalized (osteo)arthritis: Secondary | ICD-10-CM | POA: Diagnosis not present

## 2016-08-10 NOTE — Progress Notes (Signed)
HPI:  Acute visit for ? Plaque in L carotid artery: -had dental films with dentist and was told needs carotid duplex to look at L cartid art -she shared xray image with her friend whom is a Stage manager and he told her no worries but to see PCP for a carotid US -denies any ne neurological issues or changes in vision, gait, ha, dizziness, etc  ROS: See pertinent positives and negatives per HPI.  Past Medical History:  Diagnosis Date  . Alcohol use   . Anemia   . Anxiety   . Asthma   . Basal cell carcinoma    forehead  . Breast cancer ([place]) 1998   left   . Colon polyps   . GERD (gastroesophageal reflux disease)   . Glaucoma   . Hyperlipidemia   . Hypertension   . Hyponatremia    remote, mild  . Mitral valve prolapse   . Osteoarthritis    sees rheumatologist  . Osteoporosis    sees rheumatologist  . Personal history of radiation therapy 1998  . PMR (polymyalgia rheumatica) (HCC)    sees rheumatologist  . Pneumonia   . Prinzmetal angina ([place])   . White matter changes    ? ischemic sm vs per pt    Past Surgical History:  Procedure Laterality Date  . ABDOMINAL HYSTERECTOMY    . BREAST LUMPECTOMY Left 1998   cancer  . CESAREAN SECTION     x 4  . HERNIA REPAIR  2005   abdominal hernia repair  . HYSTEROTOMY    . LYMPH NODE DISSECTION Left   . MASTECTOMY, PARTIAL Left     Family History  Problem Relation Age of Onset  . Coronary artery disease Mother   . Coronary artery disease Father   . Cancer - Ovarian Sister   . Colon cancer Cousin        x 2    Social History   Social History  . Marital status: Married    Spouse name: N/A  . Number of children: 3  . Years of education: N/A   Occupational History  . volunteer    Social History Main Topics  . Smoking status: Former Smoker    Quit date: 01/25/1971  . Smokeless tobacco: Never Used  . Alcohol use 0.0 oz/week     Comment: one per day  . Drug use: No  . Sexual activity: Not Asked   Other Topics  Concern  . 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  amLODipine (NORVASC) 5 MG tablet, Take 1 tablet (5 mg total) by mouth daily., Disp: 90 tablet, Rfl: 3 .  aspirin 81 MG tablet, Take 81 mg by mouth daily., Disp: , Rfl:  .  cetirizine (ZYRTEC) 10 MG tablet, Take 10 mg by mouth 2 (two) times daily. , Disp: , Rfl:  .  denosumab (PROLIA) 60 MG/ML SOLN injection, Inject 60 mg into the skin every 6 (six) months. Administer in upper arm, thigh, or abdomen, Disp: , Rfl:  .  dicyclomine (BENTYL) 10 MG capsule, Take 1 capsule (10 mg total) by mouth 3 (three) times daily before meals., Disp: 90 capsule, Rfl: 11 .  esomeprazole (NEXIUM) 40 MG capsule,  Take 1 capsule (40 mg total) by mouth daily., Disp: 30 capsule, Rfl: 11 .  ezetimibe (ZETIA) 10 MG tablet, Take 1 tablet (10 mg total) by mouth daily., Disp: 30 tablet, Rfl: 1 .  furosemide (LASIX) 20 MG tablet, Take 10 mg by mouth 3 (three) times a week., Disp: , Rfl:  .  IRON PO, Take 1 tablet by mouth daily., Disp: , Rfl:  .  LORazepam (ATIVAN) 1 MG tablet, Take 1 mg by mouth at bedtime as needed (spasms)., Disp: , Rfl:  .  MAGNESIUM PO, Take 1 tablet by mouth daily., Disp: , Rfl:  .  Multiple Vitamins-Minerals (MULTIVITAMIN PO), Take 1 tablet by mouth daily., Disp: , Rfl:  .  POTASSIUM PO, Take 1 tablet by mouth daily., Disp: , Rfl:  .  valsartan (DIOVAN) 80 MG tablet, Take 80 mg by mouth daily., Disp: , Rfl:  .  vitamin B-12 (CYANOCOBALAMIN) 1000 MCG tablet, Take 1,000 mcg by mouth daily., Disp: , Rfl:   EXAM:  Vitals:   08/11/16 0907  BP: 120/78  Pulse: 81  Temp: 98.5 F (36.9 C)    Body mass index is 23.74 kg/m.  GENERAL: vitals reviewed and listed above, alert, oriented, appears well hydrated and in no  acute distress  HEENT: atraumatic, conjunttiva clear, no obvious abnormalities on inspection of external nose and ears  NECK: no obvious masses on inspection or palpitation, no bruit  LUNGS: clear to auscultation bilaterally, no wheezes, rales or rhonchi, good air movement  CV: HRRR, no peripheral edema  MS: moves all extremities without noticeable abnormality  PSYCH: pleasant and cooperative, no obvious depression or anxiety  ASSESSMENT AND PLAN:  Discussed the following assessment and plan:  Calcification of left carotid artery - Plan: VAS US CAROTID  -we discussed possible serious and likely etiologies, workup and treatment, treatment risks and return precautions, asymptomatic, no bruit -of course, we advised Chrissie  to return if new concerns arise.   Patient Instructions  -We placed a referral for you as discussed for the ultrasound of the neck artery. It usually takes about 1-2 weeks to process and schedule this referral. If you have not heard from [place] regarding this appointment in 2 weeks please contact our office.    Colin Benton R., DO

## 2016-08-11 ENCOUNTER — Encounter: Payer: Self-pay | Admitting: Family Medicine

## 2016-08-11 ENCOUNTER — Ambulatory Visit (INDEPENDENT_AMBULATORY_CARE_PROVIDER_SITE_OTHER): Payer: Medicare Other | Admitting: Family Medicine

## 2016-08-11 ENCOUNTER — Telehealth: Payer: Self-pay

## 2016-08-11 VITALS — BP 120/78 | HR 81 | Temp 98.5°F | Ht <= 58 in | Wt 107.8 lb

## 2016-08-11 DIAGNOSIS — I201 Angina pectoris with documented spasm: Secondary | ICD-10-CM

## 2016-08-11 DIAGNOSIS — I6522 Occlusion and stenosis of left carotid artery: Secondary | ICD-10-CM | POA: Diagnosis not present

## 2016-08-11 NOTE — Telephone Encounter (Signed)
Spoke with pt and she is scheduled to see Dr Maudie Mercury today. Nothing further needed at this time.   [facility] - Client Client Site [facility] - Day Physician Colin Benton- MD Contact Type Call Who Is Calling Patient / Member / Family / Caregiver Caller Name [name] Caller Phone Number [phone] Patient Name Amanda Cannon Call Type Message Only Information Provided Reason for Call Request for General Office Information Initial Comment Caller states she needs to have an ultrasound of her carotid artery and her neck. She needs to know if she can wait until her appt with Manuela Schwartz in September or if she needs to make an appt with Dr. Maudie Mercury. Caller declines triage.

## 2016-08-11 NOTE — Patient Instructions (Signed)
-  We placed a referral for you as discussed for the ultrasound of the neck artery. It usually takes about 1-2 weeks to process and schedule this referral. If you have not heard from [place] regarding this appointment in 2 weeks please contact our office.

## 2016-08-14 ENCOUNTER — Encounter: Payer: Self-pay | Admitting: Interventional Cardiology

## 2016-08-15 MED ORDER — IRBESARTAN 75 MG PO TABS: 75.0000 mg | ORAL_TABLET | Freq: Every day | ORAL | 3 refills | Status: DC

## 2016-08-30 DIAGNOSIS — F4322 Adjustment disorder with anxiety: Secondary | ICD-10-CM | POA: Diagnosis not present

## 2016-08-31 ENCOUNTER — Ambulatory Visit (HOSPITAL_COMMUNITY)
Admission: RE | Admit: 2016-08-31 | Discharge: 2016-08-31 | Disposition: A | Discharge status: Home / Self Care | Payer: Medicare Other | Source: Ambulatory Visit | Attending: Cardiology | Admitting: Cardiology

## 2016-08-31 DIAGNOSIS — I6522 Occlusion and stenosis of left carotid artery: Secondary | ICD-10-CM | POA: Diagnosis present

## 2016-08-31 DIAGNOSIS — I6523 Occlusion and stenosis of bilateral carotid arteries: Secondary | ICD-10-CM | POA: Insufficient documentation

## 2016-09-01 ENCOUNTER — Encounter: Payer: Self-pay | Admitting: Family Medicine

## 2016-09-02 ENCOUNTER — Encounter: Payer: Self-pay | Admitting: Interventional Cardiology

## 2016-09-08 ENCOUNTER — Other Ambulatory Visit: Payer: Medicare Other | Admitting: *Deleted

## 2016-09-08 DIAGNOSIS — E7801 Familial hypercholesterolemia: Secondary | ICD-10-CM | POA: Diagnosis not present

## 2016-09-08 DIAGNOSIS — E78019 Familial hypercholesterolemia, unspecified: Secondary | ICD-10-CM

## 2016-09-08 LAB — LIPID PANEL
CHOL/HDL RATIO: 3.5 ratio (ref 0.0–4.4)
Cholesterol, Total: 228 mg/dL — ABNORMAL HIGH (ref 100–199)
HDL: 66 mg/dL (ref >39)
LDL Calculated: 133 mg/dL — ABNORMAL HIGH (ref 0–99)
Triglycerides: 147 mg/dL (ref 0–149)
VLDL Cholesterol Cal: 29 mg/dL (ref 5–40)

## 2016-09-08 NOTE — Progress Notes (Signed)
Patient ID: Amanda Cannon                 DOB: 15-Oct-1945                    MRN: [id number]     HPI: Amanda Cannon is a 71 y.o. female patient referred to lipid clinic by Dr. Tamala Julian. PMH is significant for Prinzmetal angina, hypertension, hyperlipidemia, and remote history of mitral valve prolapse. History of ischemic brain lesions per Dr. Thompson Caul note on 07/06/16. Per patient, a neurologist told her that these areas were indicative of strokes based on MRI imaging. She is asymptomatic except for dizziness. It has been "years" since an MRI was repeated. Dr. Tamala Julian reviewed her advance lipid panel and determined her to be at low to moderate risk for heart disease in the future.   She has previously tried atorvastatin 10mg  daily, but developed severe muscle cramps in the legs with neuropathy. This hindered her ability to walk. She reported trying atorvastatin down to 5mg  once weekly, and was able to tolerate this dose. She did not stay on it long-term because she states that no provider seemed worried about her cholesterol. She endorses a strong family history of atherosclerosis, stating that it was the cause of death for her mother and father. At her most recent lipid OV, rosuvastatin 5mg  was initiated three times a week with the plan to increase to daily if tolerated for 2 weeks. On 6/25, pt called stating she had neuropathy in left leg, weakness in legs and minor spasms in legs since starting the Crestor. Does not wish to continue with statin therapy and not interested in injectable therapy currently. Zetia 10 mg daily was started.  Pt presents today for f/u. She has been on Zetia for about a month and reports that she is tolerating the medication well overall. She states that she does have some minor leg pain while on Zetia but nothing concerning. Says that Crestor gave her neuropathy and bad leg cramps, and she does not want to be on a statin.   Current Medications: zetia 10 mg daily    Intolerances:  atorvastatin 10 mg - severe muscle cramps in legs w/ neuropathy, couldn't walk Crestor 5 mg once weekly - severe muscle cramps in legs w/ neuropathy  Risk Factors: age, HTN, HLD, angina, ischemic brain lesions - ASCVD 10-yr risk 20%  12 LDL goal: < 70 mg/dL due to elevated apoB, self-reported ischemic lesions, and symptomatic angina   Diet: Drinks at least 1.5 servings of liquor each night (bourbon w/ water and cognac).   Exercise: Beginning to start light-impact exercise with husband (who has spinal stenosis).    Family History: The patient's family history includes Cancer - Ovarian in her sister; Colon cancer in her cousin; Coronary artery disease in her father and mother (87, 58 passed away 1 month apart).  Social History: Former smoker (quit 4098), denies illicit drug use. 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   Labs: 8/16 - TC 228, TG 147, HDL 66, LDL 133, VLDL 29 - on Zetia 10mg  daily   Past Medical History:  Diagnosis Date  . Alcohol use   . Anemia   . Anxiety   . Asthma   . Basal cell carcinoma    forehead  . Breast cancer ([place]) 1998   left   . Colon polyps   . GERD (gastroesophageal reflux disease)   . Glaucoma   . Hyperlipidemia   .  Hypertension   . Hyponatremia    remote, mild  . Mitral valve prolapse   . Osteoarthritis    sees rheumatologist  . Osteoporosis    sees rheumatologist  . Personal history of radiation therapy 1998  . PMR (polymyalgia rheumatica) (HCC)    sees rheumatologist  . Pneumonia   . Prinzmetal angina ([place])   . White matter changes    ? ischemic sm vs per pt    Current Outpatient Prescriptions on File Prior to Visit  Medication Sig Dispense Refill  . amLODipine (NORVASC) 5 MG tablet Take 1 tablet (5 mg total) by mouth daily. 90 tablet 3  . aspirin 81 MG tablet Take 81 mg by mouth daily.    . cetirizine (ZYRTEC) 10 MG tablet Take 10 mg by mouth 2 (two) times daily.      Marland Kitchen denosumab (PROLIA) 60 MG/ML SOLN injection Inject 60 mg into the skin every 6 (six) months. Administer in upper arm, thigh, or abdomen    . dicyclomine (BENTYL) 10 MG capsule Take 1 capsule (10 mg total) by mouth 3 (three) times daily before meals. 90 capsule 11  . esomeprazole (NEXIUM) 40 MG capsule Take 1 capsule (40 mg total) by mouth daily. 30 capsule 11  . ezetimibe (ZETIA) 10 MG tablet Take 1 tablet (10 mg total) by mouth daily. 30 tablet 1  . furosemide (LASIX) 20 MG tablet Take 10 mg by mouth 3 (three) times a week.    . irbesartan (AVAPRO) 75 MG tablet Take 1 tablet (75 mg total) by mouth daily. 90 tablet 3  . IRON PO Take 1 tablet by mouth daily.    Marland Kitchen LORazepam (ATIVAN) 1 MG tablet Take 1 mg by mouth at bedtime as needed (spasms).    Marland Kitchen MAGNESIUM PO Take 1 tablet by mouth daily.    . Multiple Vitamins-Minerals (MULTIVITAMIN PO) Take 1 tablet by mouth daily.    Marland Kitchen POTASSIUM PO Take 1 tablet by mouth daily.    . vitamin B-12 (CYANOCOBALAMIN) 1000 MCG tablet Take 1,000 mcg by mouth daily.     No current facility-administered medications on file prior to visit.     Allergies  Allergen Reactions  . Tylenol [Acetaminophen] Cough  . Erythromycin     Stomach cramps  . Erythromycin Base Other (See Comments)    Stomach issues  . Garlic Swelling    GI upset  . Other     Beta-blockers-patient states it makes her crazy  . Sulfa Antibiotics Other (See Comments)    Doesn't recall    Assessment/Plan:  Hyperlipidemia: LDL did improve after starting Zetia 10 mg, but levels are not at her goal of <70 mg/dL. Pt does not want to take statins due to experiencing adverse effects. Discussed options with pt including clinical trial and PCSK9 inhibitors. Pt is interested in trying Repatha depending on cost. Will submit application to insurance company and apply for patient assistance. If Repatha is too expensive and unable to obtain patient assistance, pt is agreeable to enroll in the clinical  trial. Continue Zetia 10 mg daily.    Barkley Boards, PharmD Student.   Thank you, Lelan Pons. Patterson [facility], [place]  [address] [place], [place] [address]  Phone: [phone]; Fax: [phone] 09/09/2016 1:59 PM

## 2016-09-09 ENCOUNTER — Encounter: Payer: Self-pay | Admitting: Pharmacist

## 2016-09-09 ENCOUNTER — Ambulatory Visit (INDEPENDENT_AMBULATORY_CARE_PROVIDER_SITE_OTHER): Payer: Medicare Other | Admitting: Pharmacist

## 2016-09-09 DIAGNOSIS — E7801 Familial hypercholesterolemia: Secondary | ICD-10-CM

## 2016-09-09 MED ORDER — EZETIMIBE 10 MG PO TABS: 10.0000 mg | ORAL_TABLET | Freq: Every day | ORAL | 3 refills | Status: DC

## 2016-09-09 NOTE — Patient Instructions (Addendum)
It was good seeing you today!  Your cholesterol has improved with Zetia, but your LDL cholesterol is 133, still above your goal of <70 mg/dL.   We discussed Reptha being an option to help lower your cholesterol.   We also discussed potentially enrolling in a clinical trial if Repatha is cost prohibitive.   We will submit an application to your insurance for [place] patient assistance.   They will likely contact you first regarding the status of the application. If you get the call, please let us know at [phone].   For now, continue to take Zetia 10 mg 1 tab by mouth daily.

## 2016-09-13 ENCOUNTER — Telehealth: Payer: Self-pay | Admitting: Pharmacist

## 2016-09-13 MED ORDER — ALIROCUMAB 75 MG/ML ~~LOC~~ SOPN: 1.0000 "pen " | PEN_INJECTOR | SUBCUTANEOUS | 11 refills | Status: DC

## 2016-09-13 NOTE — Telephone Encounter (Signed)
Received a denial for Repatha, pt's insurance has Praluent on formulary. Discussed change in therapy with pt and she is ok with taking Praluent instead. Will submit prior authorization for Praluent. If copay is cost prohibitive, will need pt to fill out PASS paperwork. Discussed this over the phone and pt is aware she will need a medication expense report from her pharmacy totaling > $300 for the year. Will f/u with pt via telephone when copay information is available.

## 2016-09-13 NOTE — Addendum Note (Signed)
Addended by: SUPPLE, MEGAN E on: 09/13/2016 08:38 AM   Modules accepted: Orders

## 2016-09-13 NOTE — Telephone Encounter (Signed)
PA for Praluent approved through 09/13/17. Will send rx to specialty pharmacy and f/u with copay information.

## 2016-09-15 NOTE — Telephone Encounter (Signed)
Praluent monthly copay is cost prohibitive at $374. Spoke with pt and she will fill out PASS patient assistance program and fax to [place] with proof of $300 spent out of pocket on medications this year.

## 2016-10-04 NOTE — Telephone Encounter (Addendum)
Praluent has been approved through [employer] program for $0 copay. Pt is aware and will call clinic once she receives her first shipment so we can set up lab work.

## 2016-10-05 DIAGNOSIS — M2550 Pain in unspecified joint: Secondary | ICD-10-CM | POA: Diagnosis not present

## 2016-10-05 DIAGNOSIS — M810 Age-related osteoporosis without current pathological fracture: Secondary | ICD-10-CM | POA: Diagnosis not present

## 2016-10-06 ENCOUNTER — Encounter: Payer: Self-pay | Admitting: Family Medicine

## 2016-10-06 ENCOUNTER — Other Ambulatory Visit: Payer: Self-pay | Admitting: *Deleted

## 2016-10-06 DIAGNOSIS — H9190 Unspecified hearing loss, unspecified ear: Secondary | ICD-10-CM

## 2016-10-06 NOTE — Telephone Encounter (Signed)
Order completed on a Rx pad listing Dr Julianne Rice NPI number, for an audiology eval-hearing eval--diagnosis: hearing impairment, hearing loss, with a copy of the Medicare card and faxed to [facility] at [phone] attn: Michelene Heady.

## 2016-10-11 ENCOUNTER — Telehealth: Payer: Self-pay | Admitting: Pharmacist

## 2016-10-11 DIAGNOSIS — E782 Mixed hyperlipidemia: Secondary | ICD-10-CM

## 2016-10-11 NOTE — Telephone Encounter (Signed)
Pt presents today for Praluent injection training. She succesfully self-administered Praluent into the abdomen with no adverse effects. F/u labs scheduled in the beginning of November.  Pt did mention some diarrhea and dizziness that she's attributing to her Zetia. Advised her that she can cut her Zetia in half and take 5mg  daily to help with GI tolerability. She states she will continue on the 10mg  dose but decrease to 5mg  if her symptoms worsen.

## 2016-10-13 ENCOUNTER — Encounter: Payer: Self-pay | Admitting: Family Medicine

## 2016-10-14 DIAGNOSIS — M810 Age-related osteoporosis without current pathological fracture: Secondary | ICD-10-CM | POA: Diagnosis not present

## 2016-11-04 NOTE — Progress Notes (Signed)
Subjective:   Amanda Cannon is a 71 y.o. female who presents for Medicare Annual (Subsequent) preventive examination.  The Patient was informed that the wellness visit is to identify future health risk and educate and initiate measures that can reduce risk for increased disease through the lifespan.    Annual Wellness Assessment  Reports health as confusing  Educated on medicare AWV and that she can see Dr. Maudie Mercury for any issues or fup as needed   Preventive Screening -Counseling & Management  Medicare Annual Preventive Care Visit - Subsequent Last OV 07/2016 On Praluent - states she is doing well   Health Maintenance Due  Topic Date Due  . OPHTHALMOLOGY EXAM  10/28/1954  . TETANUS/TDAP  10/28/1963  . HEMOGLOBIN A1C  11/05/2016   Colonoscopy 07/2012 notes state 01/2013 and posted for 10 years / Mammogram 07/06/2016 Bone density 12/2015  -2.4 from a 5  ( hx of PMR; chronic muscle spasms; osteoporosis Sees dr. Amil Amen q 6 months On Prolia)  ETOH 2 scotches per day  Recommended one drink per day   Tobacco use; quit 72   VS reviewed;   Diet  States she does not eat a lot Have an egg for breakfast;  Bread with cream cheese Eats peanuts;  Sometimes has atkins  States she eats fruits and vegetables    BMI 23   Exercise Used to go to the gym;  Occasionally goes to the gym  Just complete order for hearing screen at [facility]      Cardiac Risk Factors Addressed Hyperlipidemia - chol ratio 3.5; chol 220; hdl 66; ldl 133; trig 147  Pre-diabetes A1c 5.6     Advanced Directives Had one but does not know where it is Agreed to take form from [facility]   Patient Care Team: Lucretia Kern, DO as PCP - General (Family Medicine)   Cardiac Risk Factors include: advanced age (>64men, >18 women);dyslipidemia;hypertension;sedentary lifestyle     Objective:     Vitals: BP 110/80   Pulse 90   Ht 4' 8.5" (1.435 m)   Wt 106 lb (48.1 kg)   SpO2 98%   BMI 23.35 kg/m   Body  mass index is 23.35 kg/m.   Tobacco History  Smoking Status  . Former Smoker  . Packs/day: 1.00  . Years: 7.00  . Quit date: 01/25/1971  Smokeless Tobacco  . Never Used     Counseling given: Yes   Past Medical History:  Diagnosis Date  . Alcohol use   . Anemia   . Anxiety   . Asthma   . Basal cell carcinoma    forehead  . Breast cancer ([place]) 1998   left   . Colon polyps   . GERD (gastroesophageal reflux disease)   . Glaucoma   . Hyperlipidemia   . Hypertension   . Hyponatremia    remote, mild  . Mitral valve prolapse   . Osteoarthritis    sees rheumatologist  . Osteoporosis    sees rheumatologist  . Personal history of radiation therapy 1998  . PMR (polymyalgia rheumatica) (HCC)    sees rheumatologist  . Pneumonia   . Prinzmetal angina ([place])   . White matter changes    ? ischemic sm vs per pt   Past Surgical History:  Procedure Laterality Date  . ABDOMINAL HYSTERECTOMY    . BREAST LUMPECTOMY Left 1998   cancer  . CESAREAN SECTION     x 4  . HERNIA REPAIR  2005   abdominal  hernia repair  . HYSTEROTOMY    . LYMPH NODE DISSECTION Left   . MASTECTOMY, PARTIAL Left    Family History  Problem Relation Age of Onset  . Coronary artery disease Mother   . Coronary artery disease Father   . Cancer - Ovarian Sister   . Colon cancer Cousin        x 2   History  Sexual Activity  . Sexual activity: Not on file    Outpatient Encounter Prescriptions as of 11/08/2016  Medication Sig  . Alirocumab (PRALUENT) 75 MG/ML SOPN Inject 1 pen into the skin every 14 (fourteen) days.  Marland Kitchen aspirin 81 MG tablet Take 81 mg by mouth daily.  . cetirizine (ZYRTEC) 10 MG tablet Take 10 mg by mouth 2 (two) times daily.   Marland Kitchen denosumab (PROLIA) 60 MG/ML SOLN injection Inject 60 mg into the skin every 6 (six) months. Administer in upper arm, thigh, or abdomen  . esomeprazole (NEXIUM) 40 MG capsule Take 1 capsule (40 mg total) by mouth daily.  . furosemide (LASIX) 20 MG tablet Take  10 mg by mouth 3 (three) times a week.  . irbesartan (AVAPRO) 75 MG tablet Take 1 tablet (75 mg total) by mouth daily.  . IRON PO Take 1 tablet by mouth daily.  Marland Kitchen LORazepam (ATIVAN) 1 MG tablet Take 1 mg by mouth at bedtime as needed (spasms).  . Multiple Vitamins-Minerals (MULTIVITAMIN PO) Take 1 tablet by mouth daily.  Marland Kitchen POTASSIUM PO Take 1 tablet by mouth daily.  . vitamin B-12 (CYANOCOBALAMIN) 1000 MCG tablet Take 1,000 mcg by mouth daily.  Marland Kitchen amLODipine (NORVASC) 5 MG tablet Take 1 tablet (5 mg total) by mouth daily.  Marland Kitchen dicyclomine (BENTYL) 10 MG capsule Take 1 capsule (10 mg total) by mouth 3 (three) times daily before meals. (Patient not taking: Reported on 11/08/2016)  . ezetimibe (ZETIA) 10 MG tablet Take 1 tablet (10 mg total) by mouth daily.  Marland Kitchen MAGNESIUM PO Take 1 tablet by mouth daily.   No facility-administered encounter medications on file as of 11/08/2016.     Activities of Daily Living In your present state of health, do you have any difficulty performing the following activities: 11/08/2016  Hearing? Y  Comment referral   Vision? (No Data)  Comment has some glaucoma   Difficulty concentrating or making decisions? (No Data)  Comment some memory   Walking or climbing stairs? Y  Comment due to her constriction of BV   Dressing or bathing? N  Doing errands, shopping? N  Preparing Food and eating ? N  Using the Toilet? N  In the past six months, have you accidently leaked urine? N  Do you have problems with loss of bowel control? Y  Comment has to take her mag to go to bathroom   Managing your Medications? N  Managing your Finances? N  Housekeeping or managing your Housekeeping? N  Some recent data might be hidden    Patient Care Team: Lucretia Kern, DO as PCP - General (Family Medicine)    Assessment:     Exercise Activities and Dietary recommendations Current Exercise Habits: Home exercise routine, Time (Minutes): 30, Intensity: Mild (encouraging Y)  Goals     . Exercise 150 minutes per week (moderate activity)          Do more physical things Liked to do weights at the gym Bike and walks Loves stretching out     . Exercise 150 minutes per week (moderate activity)  Start to go to a yoga class      Fall Risk Fall Risk  11/08/2016  Falls in the past year? No   Depression Screen PHQ 2/9 Scores 11/08/2016  PHQ - 2 Score 0     Cognitive Function MMSE - Mini Mental State Exam 11/08/2016  Not completed: (No Data)    some memory but nothing that impacts her life     Immunization History  Administered Date(s) Administered  . Influenza-Unspecified 10/25/2015  . Pneumococcal Conjugate-13 07/24/2013   Screening Tests Health Maintenance  Topic Date Due  . OPHTHALMOLOGY EXAM  10/28/1954  . TETANUS/TDAP  10/28/1963  . HEMOGLOBIN A1C  11/05/2016  . INFLUENZA VACCINE  11/23/2016 (Originally 08/24/2016)  . FOOT EXAM  11/23/2017 (Originally [birth year]28/1955)  . Hepatitis C Screening  08/12/2026 (Originally 05-25-1945)  . MAMMOGRAM  07/07/2018  . COLONOSCOPY  01/25/2023  . DEXA SCAN  Completed  . PNA vac Low Risk Adult  Completed      Plan:     PCP Notes Declines tetanus; will take if needed for wound or other   Will take flu vaccine next week; want to defer because she is going to a wedding this weekend and thinks she got sick with the last one   Educated regarding the shingrix  Call to Dr. Jola Schmidt and last eye exam was 02/09/2016 Office will fax over her notes  Agrees to take AD directives with her; She did not need any education; had completed in the past in another state    Health Maintenance Encouraging her to eat more nutritiously  Encouraging her to exercise or find private time for yoga or other   Abnormal Screens  Reports some dizziness and encouraged a fup with Dr. Maudie Mercury   Referrals  [employer] for hearing aid assistance   Patient concerns; States she has some numbness on the side of her fact  This has been  long standing   Nurse Concerns; As noted   Next PCP apt To make fup apt Dr.Kim for increased dizziness; facial numbness which she states is long standing or any other issues or concerns she has     I have personally reviewed and noted the following in the patient's chart:   . Medical and social history . Use of alcohol, tobacco or illicit drugs  . Current medications and supplements . Functional ability and status . Nutritional status . Physical activity . Advanced directives . List of other physicians . Hospitalizations, surgeries, and ER visits in previous 12 months . Vitals . Screenings to include cognitive, depression, and falls . Referrals and appointments  In addition, I have reviewed and discussed with patient certain preventive protocols, quality metrics, and best practice recommendations. A written personalized care plan for preventive services as well as general preventive health recommendations were provided to patient.     Wynetta Fines, RN  11/08/2016

## 2016-11-08 ENCOUNTER — Ambulatory Visit: Payer: Medicare Other

## 2016-11-08 ENCOUNTER — Ambulatory Visit: Payer: Medicare Other | Admitting: Family Medicine

## 2016-11-08 VITALS — BP 110/80 | HR 90 | Ht <= 58 in | Wt 106.0 lb

## 2016-11-08 DIAGNOSIS — Z0000 Encounter for general adult medical examination without abnormal findings: Secondary | ICD-10-CM

## 2016-11-08 NOTE — Patient Instructions (Addendum)
Amanda Cannon , Thank you for taking time to come for your Medicare Wellness Visit. I appreciate your ongoing commitment to your health goals. Please review the following plan we discussed and let me know if I can assist you in the future.   Declines flu vaccine due to going to a wedding this weekend but will schedule next week when you return Dr. Fuller Plan is following colonoscopy   Develop time for yourself to stretch or do something that helps you Try yoga or dance class   Deaf & Hard of Hearing Division Services - can assist with hearing aid x 1  No reviews  [facility]  [id number]  [phone] - Amanda Cannon   A Tetanus is recommended every 10 years. Medicare covers a tetanus if you have a cut or wound; otherwise, there may be a charge. If you had not had a tetanus with pertusses, known as the Tdap, you can take this anytime.    Shingrix is a vaccine for the prevention of Shingles in Adults 50 and older.  If you are on Medicare, you can request a prescription from your doctor to be filled at a pharmacy.  Please check with your benefits regarding applicable copays or out of pocket expenses.  The Shingrix is given in 2 vaccines approx 8 weeks apart. You must receive the 2nd dose prior to 6 months from receipt of the first.    These are the goals we discussed: Goals    . Exercise 150 minutes per week (moderate activity)          Do more physical things Liked to do weights at the gym Bike and walks Loves stretching out     . Exercise 150 minutes per week (moderate activity)          Start to go to a yoga class       This is a list of the screening recommended for you and due dates:  Health Maintenance  Topic Date Due  . Eye exam for diabetics  10/28/1954  . Tetanus Vaccine  10/28/1963  . Flu Shot  08/24/2016  . Hemoglobin A1C  11/05/2016  . Complete foot exam   11/23/2017*  .  Hepatitis C: One time screening is recommended by Center for Disease  Control  (CDC) for  adults born from [birth year] through 1966.   08/12/2026*  . Mammogram  07/07/2018  . Colon Cancer Screening  01/25/2023  . DEXA scan (bone density measurement)  Completed  . Pneumonia vaccines  Completed  *Topic was postponed. The date shown is not the original due date.   Prevention of falls: Remove rugs or any tripping hazards in the home Use Non slip mats in bathtubs and showers Placing grab bars next to the toilet and or shower Placing handrails on both sides of the stair way Adding extra lighting in the home.   Personal safety issues reviewed:  1. Consider starting a community watch program per [facility] 2.  Changes batteries is smoke detector and/or carbon monoxide detector  3.  If you have firearms; keep them in a safe place 4.  Wear protection when in the sun; Always wear sunscreen or a hat; It is good to have your doctor check your skin annually or review any new areas of concern 5. Driving safety; Keep in the right lane; stay 3 car lengths behind the car in front of you on the highway; look 3 times prior to pulling out;  carry your cell phone everywhere you go!    Learn about the Yellow Dot program:  The program allows first responders at your emergency to have access to who your physician is, as well as your medications and medical conditions.  Citizens requesting the Yellow Dot Packages should contact Master Corporal Amanda Cannon at the [facility] [phone] for the first week of the program and beginning the week after Easter citizens should contact their Scientist, physiological.    Health Maintenance for Postmenopausal Women Menopause is a normal process in which your reproductive ability comes to an end. This process happens gradually over a span of months to years, usually between the ages of 68 and 17. Menopause is complete when you have missed 12 consecutive menstrual periods. It is important to talk with your  health care provider about some of the most common conditions that affect postmenopausal women, such as heart disease, cancer, and bone loss (osteoporosis). Adopting a healthy lifestyle and getting preventive care can help to promote your health and wellness. Those actions can also lower your chances of developing some of these common conditions. What should I know about menopause? During menopause, you may experience a number of symptoms, such as:  Moderate-to-severe hot flashes.  Night sweats.  Decrease in sex drive.  Mood swings.  Headaches.  Tiredness.  Irritability.  Memory problems.  Insomnia.  Choosing to treat or not to treat menopausal changes is an individual decision that you make with your health care provider. What should I know about hormone replacement therapy and supplements? Hormone therapy products are effective for treating symptoms that are associated with menopause, such as hot flashes and night sweats. Hormone replacement carries certain risks, especially as you become older. If you are thinking about using estrogen or estrogen with progestin treatments, discuss the benefits and risks with your health care provider. What should I know about heart disease and stroke? Heart disease, heart attack, and stroke become more likely as you age. This may be due, in part, to the hormonal changes that your body experiences during menopause. These can affect how your body processes dietary fats, triglycerides, and cholesterol. Heart attack and stroke are both medical emergencies. There are many things that you can do to help prevent heart disease and stroke:  Have your blood pressure checked at least every 1-2 years. High blood pressure causes heart disease and increases the risk of stroke.  If you are 79-33 years old, ask your health care provider if you should take aspirin to prevent a heart attack or a stroke.  Do not use any tobacco products, including cigarettes,  chewing tobacco, or electronic cigarettes. If you need help quitting, ask your health care provider.  It is important to eat a healthy diet and maintain a healthy weight. ? Be sure to include plenty of vegetables, fruits, low-fat dairy products, and lean protein. ? Avoid eating foods that are high in solid fats, added sugars, or salt (sodium).  Get regular exercise. This is one of the most important things that you can do for your health. ? Try to exercise for at least 150 minutes each week. The type of exercise that you do should increase your heart rate and make you sweat. This is known as moderate-intensity exercise. ? Try to do strengthening exercises at least twice each week. Do these in addition to the moderate-intensity exercise.  Know your numbers.Ask your health care provider to check your cholesterol and your blood glucose. Continue to  have your blood tested as directed by your health care provider.  What should I know about cancer screening? There are several types of cancer. Take the following steps to reduce your risk and to catch any cancer development as early as possible. Breast Cancer  Practice breast self-awareness. ? This means understanding how your breasts normally appear and feel. ? It also means doing regular breast self-exams. Let your health care provider know about any changes, no matter how small.  If you are 58 or older, have a clinician do a breast exam (clinical breast exam or CBE) every year. Depending on your age, family history, and medical history, it may be recommended that you also have a yearly breast X-ray (mammogram).  If you have a family history of breast cancer, talk with your health care provider about genetic screening.  If you are at high risk for breast cancer, talk with your health care provider about having an MRI and a mammogram every year.  Breast cancer (BRCA) gene test is recommended for women who have family members with BRCA-related  cancers. Results of the assessment will determine the need for genetic counseling and BRCA1 and for BRCA2 testing. BRCA-related cancers include these types: ? Breast. This occurs in males or females. ? Ovarian. ? Tubal. This may also be called fallopian tube cancer. ? Cancer of the abdominal or pelvic lining (peritoneal cancer). ? Prostate. ? Pancreatic.  Cervical, Uterine, and Ovarian Cancer Your health care provider may recommend that you be screened regularly for cancer of the pelvic organs. These include your ovaries, uterus, and vagina. This screening involves a pelvic exam, which includes checking for microscopic changes to the surface of your cervix (Pap test).  For women ages 21-65, health care providers may recommend a pelvic exam and a Pap test every three years. For women ages 3-65, they may recommend the Pap test and pelvic exam, combined with testing for human papilloma virus (HPV), every five years. Some types of HPV increase your risk of cervical cancer. Testing for HPV may also be done on women of any age who have unclear Pap test results.  Other health care providers may not recommend any screening for nonpregnant women who are considered low risk for pelvic cancer and have no symptoms. Ask your health care provider if a screening pelvic exam is right for you.  If you have had past treatment for cervical cancer or a condition that could lead to cancer, you need Pap tests and screening for cancer for at least 20 years after your treatment. If Pap tests have been discontinued for you, your risk factors (such as having a new sexual partner) need to be reassessed to determine if you should start having screenings again. Some women have medical problems that increase the chance of getting cervical cancer. In these cases, your health care provider may recommend that you have screening and Pap tests more often.  If you have a family history of uterine cancer or ovarian cancer, talk with  your health care provider about genetic screening.  If you have vaginal bleeding after reaching menopause, tell your health care provider.  There are currently no reliable tests available to screen for ovarian cancer.  Lung Cancer Lung cancer screening is recommended for adults 47-37 years old who are at high risk for lung cancer because of a history of smoking. A yearly low-dose CT scan of the lungs is recommended if you:  Currently smoke.  Have a history of at least 30 pack-years  of smoking and you currently smoke or have quit within the past 15 years. A pack-year is smoking an average of one pack of cigarettes per day for one year.  Yearly screening should:  Continue until it has been 15 years since you quit.  Stop if you develop a health problem that would prevent you from having lung cancer treatment.  Colorectal Cancer  This type of cancer can be detected and can often be prevented.  Routine colorectal cancer screening usually begins at age 74 and continues through age 45.  If you have risk factors for colon cancer, your health care provider may recommend that you be screened at an earlier age.  If you have a family history of colorectal cancer, talk with your health care provider about genetic screening.  Your health care provider may also recommend using home test kits to check for hidden blood in your stool.  A small camera at the end of a tube can be used to examine your colon directly (sigmoidoscopy or colonoscopy). This is done to check for the earliest forms of colorectal cancer.  Direct examination of the colon should be repeated every 5-10 years until age 16. However, if early forms of precancerous polyps or small growths are found or if you have a family history or genetic risk for colorectal cancer, you may need to be screened more often.  Skin Cancer  Check your skin from head to toe regularly.  Monitor any moles. Be sure to tell your health care  provider: ? About any new moles or changes in moles, especially if there is a change in a mole's shape or color. ? If you have a mole that is larger than the size of a pencil eraser.  If any of your family members has a history of skin cancer, especially at a young age, talk with your health care provider about genetic screening.  Always use sunscreen. Apply sunscreen liberally and repeatedly throughout the day.  Whenever you are outside, protect yourself by wearing long sleeves, pants, a wide-brimmed hat, and sunglasses.  What should I know about osteoporosis? Osteoporosis is a condition in which bone destruction happens more quickly than new bone creation. After menopause, you may be at an increased risk for osteoporosis. To help prevent osteoporosis or the bone fractures that can happen because of osteoporosis, the following is recommended:  If you are 22-60 years old, get at least 1,000 mg of calcium and at least 600 mg of vitamin D per day.  If you are older than age 83 but younger than age 67, get at least 1,200 mg of calcium and at least 600 mg of vitamin D per day.  If you are older than age 35, get at least 1,200 mg of calcium and at least 800 mg of vitamin D per day.  Smoking and excessive alcohol intake increase the risk of osteoporosis. Eat foods that are rich in calcium and vitamin D, and do weight-bearing exercises several times each week as directed by your health care provider. What should I know about how menopause affects my mental health? Depression may occur at any age, but it is more common as you become older. Common symptoms of depression include:  Low or sad mood.  Changes in sleep patterns.  Changes in appetite or eating patterns.  Feeling an overall lack of motivation or enjoyment of activities that you previously enjoyed.  Frequent crying spells.  Talk with your health care provider if you think that you are  experiencing depression. What should I know  about immunizations? It is important that you get and maintain your immunizations. These include:  Tetanus, diphtheria, and pertussis (Tdap) booster vaccine.  Influenza every year before the flu season begins.  Pneumonia vaccine.  Shingles vaccine.  Your health care provider may also recommend other immunizations. This information is not intended to replace advice given to you by your health care provider. Make sure you discuss any questions you have with your health care provider. Document Released: 03/04/2005 Document Revised: 07/31/2015 Document Reviewed: 10/14/2014 Elsevier Interactive Patient Education  2018 [place] in the Home Falls can cause injuries and can affect people from all age groups. There are many simple things that you can do to make your home safe and to help prevent falls. What can I do on the outside of my home?  Regularly repair the edges of walkways and driveways and fix any cracks.  Remove high doorway thresholds.  Trim any shrubbery on the main path into your home.  Use bright outdoor lighting.  Clear walkways of debris and clutter, including tools and rocks.  Regularly check that handrails are securely fastened and in good repair. Both sides of any steps should have handrails.  Install guardrails along the edges of any raised decks or porches.  Have leaves, snow, and ice cleared regularly.  Use sand or salt on walkways during winter months.  In the garage, clean up any spills right away, including grease or oil spills. What can I do in the bathroom?  Use night lights.  Install grab bars by the toilet and in the tub and shower. Do not use towel bars as grab bars.  Use non-skid mats or decals on the floor of the tub or shower.  If you need to sit down while you are in the shower, use a plastic, non-slip stool.  Keep the floor dry. Immediately clean up any water that spills on the floor.  Remove soap buildup in the  tub or shower on a regular basis.  Attach bath mats securely with double-sided non-slip rug tape.  Remove throw rugs and other tripping hazards from the floor. What can I do in the bedroom?  Use night lights.  Make sure that a bedside light is easy to reach.  Do not use oversized bedding that drapes onto the floor.  Have a firm chair that has side arms to use for getting dressed.  Remove throw rugs and other tripping hazards from the floor. What can I do in the kitchen?  Clean up any spills right away.  Avoid walking on wet floors.  Place frequently used items in easy-to-reach places.  If you need to reach for something above you, use a sturdy step stool that has a grab bar.  Keep electrical cables out of the way.  Do not use floor polish or wax that makes floors slippery. If you have to use wax, make sure that it is non-skid floor wax.  Remove throw rugs and other tripping hazards from the floor. What can I do in the stairways?  Do not leave any items on the stairs.  Make sure that there are handrails on both sides of the stairs. Fix handrails that are broken or loose. Make sure that handrails are as long as the stairways.  Check any carpeting to make sure that it is firmly attached to the stairs. Fix any carpet that is loose or worn.  Avoid having throw rugs at the  top or bottom of stairways, or secure the rugs with carpet tape to prevent them from moving.  Make sure that you have a light switch at the top of the stairs and the bottom of the stairs. If you do not have them, have them installed. What are some other fall prevention tips?  Wear closed-toe shoes that fit well and support your feet. Wear shoes that have rubber soles or low heels.  When you use a stepladder, make sure that it is completely opened and that the sides are firmly locked. Have someone hold the ladder while you are using it. Do not climb a closed stepladder.  Add color or contrast paint or tape  to grab bars and handrails in your home. Place contrasting color strips on the first and last steps.  Use mobility aids as needed, such as canes, walkers, scooters, and crutches.  Turn on lights if it is dark. Replace any light bulbs that burn out.  Set up furniture so that there are clear paths. Keep the furniture in the same spot.  Fix any uneven floor surfaces.  Choose a carpet design that does not hide the edge of steps of a stairway.  Be aware of any and all pets.  Review your medicines with your healthcare provider. Some medicines can cause dizziness or changes in blood pressure, which increase your risk of falling. Talk with your health care provider about other ways that you can decrease your risk of falls. This may include working with a physical therapist or trainer to improve your strength, balance, and endurance. This information is not intended to replace advice given to you by your health care provider. Make sure you discuss any questions you have with your health care provider. Document Released: 12/31/2001 Document Revised: 06/09/2015 Document Reviewed: 02/14/2014 Elsevier Interactive Patient Education  2017 [employer].

## 2016-11-08 NOTE — Progress Notes (Signed)
Amanda Kittle R., DO  

## 2016-11-09 ENCOUNTER — Telehealth: Payer: Self-pay

## 2016-11-09 NOTE — Telephone Encounter (Signed)
Call to the office of Dr. Jola Schmidt [phone]  The patient had her last eye exam 08/06/26 Updated metric to be due 08/06/2017 Rivka Safer /RN

## 2016-11-15 ENCOUNTER — Ambulatory Visit (INDEPENDENT_AMBULATORY_CARE_PROVIDER_SITE_OTHER): Payer: Medicare Other

## 2016-11-15 DIAGNOSIS — Z23 Encounter for immunization: Secondary | ICD-10-CM

## 2016-11-18 DIAGNOSIS — H903 Sensorineural hearing loss, bilateral: Secondary | ICD-10-CM | POA: Diagnosis not present

## 2016-11-18 NOTE — Telephone Encounter (Signed)
Pt returned call to clinic and states she has continued to experience dizziness and stomach upset on lower dose of Zetia 5mg  daily. She is willing to continue Zetia until lipids are checked on Praluent therapy 11/1. Depending on LDL level, should be able to d/c Zetia at that time.

## 2016-11-24 ENCOUNTER — Other Ambulatory Visit: Payer: Medicare Other | Admitting: *Deleted

## 2016-11-24 DIAGNOSIS — E782 Mixed hyperlipidemia: Secondary | ICD-10-CM

## 2016-11-24 LAB — HEPATIC FUNCTION PANEL
ALBUMIN: 4.6 g/dL (ref 3.5–4.8)
ALT: 23 IU/L (ref 0–32)
AST: 26 IU/L (ref 0–40)
Alkaline Phosphatase: 62 IU/L (ref 39–117)
BILIRUBIN TOTAL: 0.4 mg/dL (ref 0.0–1.2)
BILIRUBIN, DIRECT: 0.13 mg/dL (ref 0.00–0.40)
Total Protein: 7 g/dL (ref 6.0–8.5)

## 2016-11-24 LAB — LIPID PANEL
CHOL/HDL RATIO: 1.9 ratio (ref 0.0–4.4)
CHOLESTEROL TOTAL: 153 mg/dL (ref 100–199)
HDL: 79 mg/dL (ref >39)
LDL CALC: 44 mg/dL (ref 0–99)
TRIGLYCERIDES: 150 mg/dL — AB (ref 0–149)
VLDL Cholesterol Cal: 30 mg/dL (ref 5–40)

## 2016-11-25 ENCOUNTER — Encounter: Payer: Self-pay | Admitting: Family Medicine

## 2016-11-28 ENCOUNTER — Other Ambulatory Visit: Payer: Self-pay | Admitting: Pharmacist

## 2016-11-28 DIAGNOSIS — F4322 Adjustment disorder with anxiety: Secondary | ICD-10-CM | POA: Diagnosis not present

## 2016-11-28 NOTE — Telephone Encounter (Signed)
I called the pt and she stated she would like to have her physical in April 2019 as her last one was in April of this year.  I informed her the visit on 05/06/2016 was not for a physical exam as this was an establish care visit and she stated she would like to have a comprehensive panel, urine and chest x-ray done as her last doctor did all of this as a part of her physical.  I advised her it is up to the provider which tests are order and there usually has to be a reason or problem for an x-ray or urinalysis to be done and she can discuss this at her visit.  Appts scheduled for 4/15 for both the pt and her husband.

## 2016-12-26 ENCOUNTER — Telehealth: Payer: Self-pay | Admitting: Family Medicine

## 2016-12-26 ENCOUNTER — Other Ambulatory Visit: Payer: Self-pay | Admitting: *Deleted

## 2016-12-26 NOTE — Telephone Encounter (Signed)
Copied from [place]. Topic: Quick Communication - Rx Refill/Question >> Dec 26, 2016  8:33 AM Oneta Rack wrote:  Relation to pt: spouse  Call back number: Pharmacy:  Reason for call:  Spouse requesting 3 month supply furosemide (LASIX) 20 MG tablet

## 2016-12-27 NOTE — Telephone Encounter (Signed)
This is a historical medication- please review for 3 month supply from PCP

## 2016-12-29 MED ORDER — FUROSEMIDE 20 MG PO TABS: 10.0000 mg | ORAL_TABLET | ORAL | 3 refills | Status: DC

## 2016-12-29 NOTE — Telephone Encounter (Signed)
Okay to refill for 90 days with 3 refills according to her current dose.  Please check with her to verify what she is taking currently.  Then send refill.

## 2016-12-29 NOTE — Telephone Encounter (Signed)
I called the pt and she stated she is taking 1/2 three times a week.  Patient is aware the Rx was sent to [facility] for a 90 day supply with 3 refills.

## 2017-02-02 ENCOUNTER — Encounter: Payer: Self-pay | Admitting: Family Medicine

## 2017-02-03 DIAGNOSIS — H40053 Ocular hypertension, bilateral: Secondary | ICD-10-CM | POA: Diagnosis not present

## 2017-02-04 ENCOUNTER — Encounter: Payer: Self-pay | Admitting: Gastroenterology

## 2017-02-08 DIAGNOSIS — M810 Age-related osteoporosis without current pathological fracture: Secondary | ICD-10-CM | POA: Diagnosis not present

## 2017-02-08 DIAGNOSIS — M150 Primary generalized (osteo)arthritis: Secondary | ICD-10-CM | POA: Diagnosis not present

## 2017-02-08 DIAGNOSIS — M5030 Other cervical disc degeneration, unspecified cervical region: Secondary | ICD-10-CM | POA: Diagnosis not present

## 2017-02-08 DIAGNOSIS — Z6822 Body mass index (BMI) 22.0-22.9, adult: Secondary | ICD-10-CM | POA: Diagnosis not present

## 2017-02-08 DIAGNOSIS — M2550 Pain in unspecified joint: Secondary | ICD-10-CM | POA: Diagnosis not present

## 2017-02-13 ENCOUNTER — Ambulatory Visit: Payer: Self-pay | Admitting: Family Medicine

## 2017-02-13 ENCOUNTER — Telehealth: Payer: Self-pay | Admitting: Pharmacist

## 2017-02-13 NOTE — Telephone Encounter (Signed)
Pt has been approved for continued Praluent assistance for 2019. She is aware.

## 2017-03-16 ENCOUNTER — Other Ambulatory Visit: Payer: Self-pay | Admitting: Gastroenterology

## 2017-03-28 DIAGNOSIS — F4322 Adjustment disorder with anxiety: Secondary | ICD-10-CM | POA: Diagnosis not present

## 2017-04-10 ENCOUNTER — Encounter: Payer: Self-pay | Admitting: Gastroenterology

## 2017-04-10 ENCOUNTER — Ambulatory Visit (INDEPENDENT_AMBULATORY_CARE_PROVIDER_SITE_OTHER): Payer: Medicare Other | Admitting: Gastroenterology

## 2017-04-10 VITALS — BP 116/70 | HR 104 | Ht <= 58 in | Wt 103.2 lb

## 2017-04-10 DIAGNOSIS — R1033 Periumbilical pain: Secondary | ICD-10-CM

## 2017-04-10 DIAGNOSIS — Z86010 Personal history of colonic polyps: Secondary | ICD-10-CM

## 2017-04-10 DIAGNOSIS — Z800 Family history of malignant neoplasm of digestive organs: Secondary | ICD-10-CM | POA: Diagnosis not present

## 2017-04-10 DIAGNOSIS — M810 Age-related osteoporosis without current pathological fracture: Secondary | ICD-10-CM | POA: Diagnosis not present

## 2017-04-10 NOTE — Progress Notes (Addendum)
History of Present Illness: This is a 72 year old here for the evaluation of abdominal pain.  She relates problems with periumbilical postprandial abdominal pain for a few months.  Symptoms occur with almost any foods however certain foods are worse so she avoids them.  She states eating small meals has essentially eliminated her symptoms and she has not experienced any difficulties in the past few weeks.  She has had problems with constipation and takes magnesium capsule 400 mg at bedtime which leads to loose stools which is preferable to her than constipation.  She tried dicyclomine for abdominal pain and bloating last year and felt that it may have caused her to feel dizzy, although she was not really sure it was the medication, however she discontinued and does not want to try again.it  Colonoscopy 07/2012 in [place] mild diverticulosis, internal hemorrhoids.  Colonoscopy 06/2009 in [place] 3 mm tubular adenoma in sigmoid colon EGD 06/2009 in [place] mild gastritis.    Allergies  Allergen Reactions  . Tylenol [Acetaminophen] Cough  . Turmeric     Lowers BP  . Erythromycin     Stomach cramps  . Erythromycin Base Other (See Comments)    Stomach issues  . Garlic Swelling    GI upset  . Other     Beta-blockers-patient states it makes her crazy  . Sulfa Antibiotics Other (See Comments)    Doesn't recall   Outpatient Medications Prior to Visit  Medication Sig Dispense Refill  . Alirocumab (PRALUENT) 75 MG/ML SOPN Inject 1 pen into the skin every 14 (fourteen) days. 2 pen 11  . amLODipine (NORVASC) 5 MG tablet Take 1 tablet (5 mg total) by mouth daily. 90 tablet 3  . aspirin 81 MG tablet Take 81 mg by mouth daily.    . cetirizine (ZYRTEC) 10 MG tablet Take 10 mg by mouth 2 (two) times daily.     Marland Kitchen denosumab (PROLIA) 60 MG/ML SOLN injection Inject 60 mg into the skin every 6 (six) months. Administer in upper arm, thigh, or abdomen    . esomeprazole (NEXIUM) 40 MG capsule TAKE ONE CAPSULE BY MOUTH EVERY  DAY 30 capsule 0  . furosemide (LASIX) 20 MG tablet Take 0.5 tablets (10 mg total) by mouth 3 (three) times a week. 18 tablet 3  . irbesartan (AVAPRO) 75 MG tablet Take 1 tablet (75 mg total) by mouth daily. 90 tablet 3  . IRON PO Take 1 tablet by mouth daily.    Marland Kitchen LORazepam (ATIVAN) 1 MG tablet Take 1 mg by mouth at bedtime as needed (spasms).    Marland Kitchen MAGNESIUM PO Take 400 mg by mouth daily.     . Multiple Vitamins-Minerals (MULTIVITAMIN PO) Take 1 tablet by mouth daily.    . vitamin B-12 (CYANOCOBALAMIN) 1000 MCG tablet Take 1,000 mcg by mouth daily.    Marland Kitchen dicyclomine (BENTYL) 10 MG capsule Take 1 capsule (10 mg total) by mouth 3 (three) times daily before meals. (Patient not taking: Reported on 11/08/2016) 90 capsule 11  . furosemide (LASIX) 20 MG tablet Take 10 mg by mouth 3 (three) times a week.    Marland Kitchen POTASSIUM PO Take 1 tablet by mouth daily.     No facility-administered medications prior to visit.    Past Medical History:  Diagnosis Date  . Alcohol use   . Anemia   . Anxiety   . Asthma   . Basal cell carcinoma    forehead  . Breast cancer ([place]) 1998  left   . Colon polyps   . GERD (gastroesophageal reflux disease)   . Glaucoma   . Hyperlipidemia   . Hypertension   . Hyponatremia    remote, mild  . Mitral valve prolapse   . Osteoarthritis    sees rheumatologist  . Osteoporosis    sees rheumatologist  . Personal history of radiation therapy 1998  . PMR (polymyalgia rheumatica) (HCC)    sees rheumatologist  . Pneumonia   . Prinzmetal angina ([place])   . White matter changes    ? ischemic sm vs per pt   Past Surgical History:  Procedure Laterality Date  . ABDOMINAL HYSTERECTOMY    . BREAST LUMPECTOMY Left 1998   cancer  . CESAREAN SECTION     x 4  . HERNIA REPAIR  2005   abdominal hernia repair  . HYSTEROTOMY    . LYMPH NODE DISSECTION Left   . MASTECTOMY, PARTIAL Left    Social History   Socioeconomic History  . Marital status: Married    Spouse name: None    . Number of children: 3  . Years of education: None  . Highest education level: None  Social Needs  . Financial resource strain: None  . Food insecurity - worry: None  . Food insecurity - inability: None  . Transportation needs - medical: None  . Transportation needs - non-medical: None  Occupational History  . Occupation: Psychologist, occupational  Tobacco Use  . Smoking status: Former Smoker    Packs/day: 1.00    Years: 7.00    Pack years: 7.00    Last attempt to quit: 01/25/1971    Years since quitting: 46.2  . Smokeless tobacco: Never Used  Substance and Sexual Activity  . Alcohol use: Yes    Alcohol/week: 0.0 oz    Comment: two scotches per day   . Drug use: No  . Sexual activity: None  Other Topics Concern  . 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 Coronary artery disease Mother   . Coronary artery disease Father   . Cancer - Ovarian Sister   . Colon cancer Cousin        x 2      Review of Systems: Pertinent positive and negative review of systems were noted in the above HPI section. All other review of systems were otherwise negative.    Physical Exam: General: Well developed, well nourished, no acute distress Head: Normocephalic and atraumatic Eyes:  sclerae anicteric, EOMI Ears: Normal auditory acuity Mouth: No deformity or lesions Neck: Supple, no masses or thyromegaly Lungs: Clear throughout to auscultation Heart: Regular rate and rhythm; no murmurs, rubs or bruits Abdomen: Soft, non tender and non distended. No masses, hepatosplenomegaly noted. Small hernia just left of her lower midline incision. Normal Bowel sounds Rectal: not done Musculoskeletal: Symmetrical with no gross deformities  Skin: No lesions on visible  extremities Pulses:  Normal pulses noted Extremities: No clubbing, cyanosis, edema or deformities noted Neurological: Alert oriented x 4, grossly nonfocal Cervical Nodes:  No significant cervical adenopathy Inguinal Nodes: No significant inguinal adenopathy Psychological:  Alert and cooperative. Normal mood and affect  Assessment and Recommendations:  1. Periumbilcal abdominal pain with mild constipation that  is improved with Mg 400 mg daily. Suspected IBS. Trial of IBGard 1-2 po tid ac.   2. Ventral hernia, left of midline incision. Surgical referral if it worsens.   3.  Family history of colon cancer in multiple 2nd degree relative.  Personal history of adenomatous colon polyp. Surveillance colonoscopy due in July 2019.

## 2017-04-10 NOTE — Patient Instructions (Addendum)
If you are age 73 or older, your body mass index should be between 23-30. Your Body mass index is 22.74 kg/m. If this is out of the aforementioned range listed, please consider follow up with your Primary Care Provider.  If you are age 59 or younger, your body mass index should be between 19-25. Your Body mass index is 22.74 kg/m. If this is out of the aformentioned range listed, please consider follow up with your Primary Care Provider.   Please purchase the following medications over the counter and take as directed: IB-Gard 1-2 tablets three times a day before meals.   You will be due for a recall colonoscopy in 07/2017. We will send you a reminder in the mail when it gets closer to that time.

## 2017-04-11 ENCOUNTER — Encounter: Payer: Self-pay | Admitting: Family Medicine

## 2017-04-11 ENCOUNTER — Encounter: Payer: Self-pay | Admitting: Gastroenterology

## 2017-04-17 DIAGNOSIS — M2550 Pain in unspecified joint: Secondary | ICD-10-CM | POA: Diagnosis not present

## 2017-04-17 DIAGNOSIS — M150 Primary generalized (osteo)arthritis: Secondary | ICD-10-CM | POA: Diagnosis not present

## 2017-04-18 ENCOUNTER — Encounter: Payer: Self-pay | Admitting: Family Medicine

## 2017-04-24 ENCOUNTER — Encounter: Payer: Self-pay | Admitting: Family Medicine

## 2017-04-24 DIAGNOSIS — M353 Polymyalgia rheumatica: Secondary | ICD-10-CM | POA: Insufficient documentation

## 2017-05-03 DIAGNOSIS — M810 Age-related osteoporosis without current pathological fracture: Secondary | ICD-10-CM | POA: Diagnosis not present

## 2017-05-08 ENCOUNTER — Encounter: Payer: Medicare Other | Admitting: Family Medicine

## 2017-05-13 NOTE — Progress Notes (Signed)
HPI:  Using dictation device. Unfortunately this device frequently misinterprets words/phrases.  Amanda Cannon is a pleasant 72 y.o. here for follow up. Chronic medical problems summarized below were reviewed for changes. REports  Dong well for the most part. Would like to check labs. Sees Dr. Amil Amen and had flare of PMR recently, doing ok now. Trying to eat healthy. Reports will schedule mammogram in [facility]. Saw audiologist and is supposed to get hearing aides but too expensive. Denies CP, SOB, DOE, treatment intolerance or new symptoms. AWV 11/08/16  MVP/HTN/Prinzmetal angina/HLD/CAD: -seeing Dr. Tamala Julian, cardiology -lipids done 10/2015 - quite high -does not tolerate statins, seeing lipid clinic -meds: praluent, norvasc, asa 81 mg, nexium 6m, lasix, irbesartan, ativan for prinzmetal dz in the past -regular exercise, diet is pretty good  Hx PMR/chronic muscle spasms/elevated CRP/ESR/Osteoporosis/Osteoarthritis: -seeing Dr. BAmil Amenq 697month-on prolia -lots of random aches and pains chronically   GERD/stomach aches: - sees GI next week -intermittent loose and hard stools -drinks several alcoholic drinks per day - I had difficulty getting her to really give me a number but she reports drinks scotch mixed with water as restricts water due to remote dx mild hyponatremia  Hx Breast ca: -1998 -neg braca per her report -s/p partial mastectomy L breast, hysterectomy and oophorectomy  Seasonal allergies: -takes zyrtec and this helps  Depression: -remotely -fine since off bb - feels bb caused depression   ROS: See pertinent positives and negatives per HPI.  Past Medical History:  Diagnosis Date  . Alcohol use   . Anemia   . Anxiety   . Asthma   . Basal cell carcinoma    forehead  . Breast cancer [place]   left   . Colon polyps   . GERD (gastroesophageal reflux disease)   . Glaucoma   . Hyperlipidemia   . Hypertension   . Hyponatremia    remote, mild  . Mitral valve prolapse   . Osteoarthritis    sees rheumatologist  . Osteoporosis    sees rheumatologist  . Personal history of radiation therapy 1998  . PMR (polymyalgia rheumatica) ([facility])    sees rheumatologist  . Pneumonia   . [place]metal angina [place]  . White matter changes    ? ischemic sm vs per pt    Past Surgical History:  Procedure Laterality Date  . ABDOMINAL HYSTERECTOMY    . BREAST LUMPECTOMY Left 1998   cancer  . CESAREAN SECTION     x 4  . HERNIA REPAIR  2005   abdominal hernia repair  . HYSTEROTOMY    . LYMPH NODE DISSECTION Left   . MASTECTOMY, PARTIAL Left     Family History  Problem Relation Age of Onset  . Coronary artery disease Mother   . Coronary artery disease Father   . Cancer - Ovarian Sister   . Colon cancer Cousin        x 2    SOCIAL HX: see above    Current Outpatient Medications:  .  Alirocumab (PRALUENT) 75 MG/ML SOPN, Inject 1 pen into the skin every 14 (fourteen) days., Disp: 2 pen, Rfl: 11 .  aspirin 81 MG tablet, Take 81 mg by mouth daily., Disp: , Rfl:  .  cetirizine (ZYRTEC) 10 MG tablet, Take 10 mg by mouth 2 (two) times daily. , Disp: , Rfl:  .  Cyanocobalamin (B-12 PO), Take by mouth., Disp: , Rfl:  .  denosumab (PROLIA) 60 MG/ML SOLN injection, Inject 60 mg  into the skin every 6 (six) months. Administer in upper arm, thigh, or abdomen, Disp: , Rfl:  .  esomeprazole (NEXIUM) 40 MG capsule, TAKE ONE CAPSULE BY MOUTH EVERY DAY, Disp: 30 capsule, Rfl: 0 .  furosemide (LASIX) 20 MG tablet, Take 0.5 tablets (10 mg total) by mouth 3 (three) times a week., Disp: 18 tablet, Rfl: 3 .  irbesartan (AVAPRO) 75 MG tablet, Take 1 tablet (75 mg total) by mouth daily., Disp: 90 tablet, Rfl: 3 .  IRON PO, Take 1 tablet by mouth daily., Disp: , Rfl:  .  LORazepam (ATIVAN) 1 MG tablet, Take 1 mg by mouth at bedtime as needed (spasms)., Disp: , Rfl:  .  MAGNESIUM PO, Take 400 mg by mouth daily. , Disp: , Rfl:  .  amLODipine  (NORVASC) 5 MG tablet, Take 1 tablet (5 mg total) by mouth daily., Disp: 90 tablet, Rfl: 3  EXAM:  Vitals:   05/15/17 1010  BP: 122/82  Pulse: 88  Temp: 98.4 F (36.9 C)  SpO2: 93%    Body mass index is 23.76 kg/m.  GENERAL: vitals reviewed and listed above, alert, oriented, appears well hydrated and in no acute distress  HEENT: atraumatic, conjunttiva clear, no obvious abnormalities on inspection of external nose and ears  NECK: no obvious masses on inspection  LUNGS: clear to auscultation bilaterally, no wheezes, rales or rhonchi, good air movement  CV: HRRR, no peripheral edema  MS: moves all extremities without noticeable abnormality  PSYCH: pleasant and cooperative, no obvious depression or anxiety  ASSESSMENT AND PLAN:  Discussed the following assessment and plan:  Essential hypertension - Plan: Basic metabolic panel, CBC with Differential/Platelet  Familial hypercholesterolemia  Hyponatremia  PMR (polymyalgia rheumatica) (HCC), Chronic  Prinzmetal angina (HCC)  -labs per orders -advised her of [employer] for hearing aides - may be cheaper -lifestyle recs -advised follow up with rheumatology regarding questions about inflammatory labs -advised yearly breast center evaluation and monthly self breast exams -AWV in October, sooner follow up as needed Patient Instructions  BEFORE YOU LEAVE: -labs -follow up: AWV with susan and Follow up with Dr. Maudie Mercury Oct or November after 11/08/17  We have ordered labs or studies at this visit. It can take up to 1-2 weeks for results and processing. IF results require follow up or explanation, we will call you with instructions. Clinically stable results will be released to your [facility]. If you have not heard from [place] or cannot find your results in [facility] in 2 weeks please contact our office at [phone].  If you are not yet signed up for [facility], please consider signing up.   We recommend the following healthy lifestyle for  LIFE: 1) Small portions. But, make sure to get regular (at least 3 per day), healthy meals and small healthy snacks if needed.  2) Eat a healthy clean diet.   TRY TO EAT: -at least 5-7 servings of low sugar, colorful, and nutrient rich vegetables per day (not corn, potatoes or bananas.) -berries are the best choice if you wish to eat fruit (only eat small amounts if trying to reduce weight)  -lean meets (fish, white meat of chicken or [place]) -vegan proteins for some meals - beans or tofu, whole grains, nuts and seeds -Replace bad fats with good fats - good fats include: fish, nuts and seeds, canola oil, olive oil -small amounts of low fat or non fat dairy -small amounts of100 % whole grains - check the lables -drink plenty of water  AVOID: -  SUGAR, sweets, anything with added sugar, corn syrup or sweeteners - must read labels as even foods advertised as "healthy" often are loaded with sugar -if you must have a sweetener, small amounts of stevia may be best -sweetened beverages and artificially sweetened beverages -simple starches (rice, bread, potatoes, pasta, chips, etc - small amounts of 100% whole grains are ok) -red meat, pork, butter -fried foods, fast food, processed food, excessive dairy, eggs and coconut.  3)Get at least 150 minutes of sweaty aerobic exercise per week.  4)Reduce stress - consider counseling, meditation and relaxation to balance other aspects of your life.         Lucretia Kern, DO

## 2017-05-15 ENCOUNTER — Ambulatory Visit (INDEPENDENT_AMBULATORY_CARE_PROVIDER_SITE_OTHER): Payer: Medicare Other | Admitting: Family Medicine

## 2017-05-15 ENCOUNTER — Encounter: Payer: Self-pay | Admitting: Family Medicine

## 2017-05-15 VITALS — BP 122/82 | HR 88 | Temp 98.4°F | Ht <= 58 in | Wt 106.0 lb

## 2017-05-15 DIAGNOSIS — M353 Polymyalgia rheumatica: Secondary | ICD-10-CM | POA: Diagnosis not present

## 2017-05-15 DIAGNOSIS — E871 Hypo-osmolality and hyponatremia: Secondary | ICD-10-CM

## 2017-05-15 DIAGNOSIS — I10 Essential (primary) hypertension: Secondary | ICD-10-CM | POA: Diagnosis not present

## 2017-05-15 DIAGNOSIS — E7801 Familial hypercholesterolemia: Secondary | ICD-10-CM | POA: Diagnosis not present

## 2017-05-15 DIAGNOSIS — I201 Angina pectoris with documented spasm: Secondary | ICD-10-CM

## 2017-05-15 LAB — BASIC METABOLIC PANEL
BUN: 21 mg/dL (ref 6–23)
CHLORIDE: 95 meq/L — AB (ref 96–112)
CO2: 25 meq/L (ref 19–32)
Calcium: 10 mg/dL (ref 8.4–10.5)
Creatinine, Ser: 0.81 mg/dL (ref 0.40–1.20)
GFR: 73.76 mL/min (ref >60.00)
Glucose, Bld: 93 mg/dL (ref 70–99)
Potassium: 4.5 mEq/L (ref 3.5–5.1)
SODIUM: 133 meq/L — AB (ref 135–145)

## 2017-05-15 LAB — CBC WITH DIFFERENTIAL/PLATELET
BASOS PCT: 0.6 % (ref 0.0–3.0)
Basophils Absolute: 0 10*3/uL (ref 0.0–0.1)
EOS PCT: 3.7 % (ref 0.0–5.0)
Eosinophils Absolute: 0.2 10*3/uL (ref 0.0–0.7)
HEMATOCRIT: 38.4 % (ref 36.0–46.0)
Hemoglobin: 13.2 g/dL (ref 12.0–15.0)
LYMPHS PCT: 26.6 % (ref 12.0–46.0)
Lymphs Abs: 1.6 10*3/uL (ref 0.7–4.0)
MCHC: 34.4 g/dL (ref 30.0–36.0)
MCV: 97.8 fl (ref 78.0–100.0)
MONOS PCT: 10.9 % (ref 3.0–12.0)
Monocytes Absolute: 0.7 10*3/uL (ref 0.1–1.0)
NEUTROS ABS: 3.6 10*3/uL (ref 1.4–7.7)
Neutrophils Relative %: 58.2 % (ref 43.0–77.0)
PLATELETS: 331 10*3/uL (ref 150.0–400.0)
RBC: 3.92 Mil/uL (ref 3.87–5.11)
RDW: 13.8 % (ref 11.5–15.5)
WBC: 6.2 10*3/uL (ref 4.0–10.5)

## 2017-05-15 NOTE — Patient Instructions (Signed)
BEFORE YOU LEAVE: -labs -follow up: AWV with susan and Follow up with Dr. Maudie Mercury Oct or November after 11/08/17  We have ordered labs or studies at this visit. It can take up to 1-2 weeks for results and processing. IF results require follow up or explanation, we will call you with instructions. Clinically stable results will be released to your [facility]. If you have not heard from [place] or cannot find your results in [facility] in 2 weeks please contact our office at [phone].  If you are not yet signed up for [facility], please consider signing up.   We recommend the following healthy lifestyle for LIFE: 1) Small portions. But, make sure to get regular (at least 3 per day), healthy meals and small healthy snacks if needed.  2) Eat a healthy clean diet.   TRY TO EAT: -at least 5-7 servings of low sugar, colorful, and nutrient rich vegetables per day (not corn, potatoes or bananas.) -berries are the best choice if you wish to eat fruit (only eat small amounts if trying to reduce weight)  -lean meets (fish, white meat of chicken or [place]) -vegan proteins for some meals - beans or tofu, whole grains, nuts and seeds -Replace bad fats with good fats - good fats include: fish, nuts and seeds, canola oil, olive oil -small amounts of low fat or non fat dairy -small amounts of100 % whole grains - check the lables -drink plenty of water  AVOID: -SUGAR, sweets, anything with added sugar, corn syrup or sweeteners - must read labels as even foods advertised as "healthy" often are loaded with sugar -if you must have a sweetener, small amounts of stevia may be best -sweetened beverages and artificially sweetened beverages -simple starches (rice, bread, potatoes, pasta, chips, etc - small amounts of 100% whole grains are ok) -red meat, pork, butter -fried foods, fast food, processed food, excessive dairy, eggs and coconut.  3)Get at least 150 minutes of sweaty aerobic exercise per week.  4)Reduce stress -  consider counseling, meditation and relaxation to balance other aspects of your life.

## 2017-05-22 DIAGNOSIS — L814 Other melanin hyperpigmentation: Secondary | ICD-10-CM | POA: Diagnosis not present

## 2017-05-22 DIAGNOSIS — L821 Other seborrheic keratosis: Secondary | ICD-10-CM | POA: Diagnosis not present

## 2017-05-22 DIAGNOSIS — D225 Melanocytic nevi of trunk: Secondary | ICD-10-CM | POA: Diagnosis not present

## 2017-05-23 ENCOUNTER — Other Ambulatory Visit: Payer: Self-pay | Admitting: Family Medicine

## 2017-05-23 DIAGNOSIS — Z1231 Encounter for screening mammogram for malignant neoplasm of breast: Secondary | ICD-10-CM

## 2017-05-25 DIAGNOSIS — M79641 Pain in right hand: Secondary | ICD-10-CM | POA: Insufficient documentation

## 2017-05-25 DIAGNOSIS — M13841 Other specified arthritis, right hand: Secondary | ICD-10-CM | POA: Diagnosis not present

## 2017-05-25 DIAGNOSIS — M720 Palmar fascial fibromatosis [Dupuytren]: Secondary | ICD-10-CM | POA: Diagnosis not present

## 2017-06-16 ENCOUNTER — Encounter: Payer: Self-pay | Admitting: Gastroenterology

## 2017-07-17 ENCOUNTER — Encounter: Payer: Self-pay | Admitting: Gastroenterology

## 2017-07-25 ENCOUNTER — Ambulatory Visit
Admission: RE | Admit: 2017-07-25 | Discharge: 2017-07-25 | Disposition: A | Discharge status: Home / Self Care | Payer: Medicare Other | Source: Ambulatory Visit | Attending: Family Medicine | Admitting: Family Medicine

## 2017-07-25 DIAGNOSIS — Z1231 Encounter for screening mammogram for malignant neoplasm of breast: Secondary | ICD-10-CM | POA: Diagnosis not present

## 2017-08-08 DIAGNOSIS — M5030 Other cervical disc degeneration, unspecified cervical region: Secondary | ICD-10-CM | POA: Diagnosis not present

## 2017-08-08 DIAGNOSIS — M150 Primary generalized (osteo)arthritis: Secondary | ICD-10-CM | POA: Diagnosis not present

## 2017-08-08 DIAGNOSIS — M810 Age-related osteoporosis without current pathological fracture: Secondary | ICD-10-CM | POA: Diagnosis not present

## 2017-08-08 DIAGNOSIS — M2550 Pain in unspecified joint: Secondary | ICD-10-CM | POA: Diagnosis not present

## 2017-08-08 DIAGNOSIS — Z6823 Body mass index (BMI) 23.0-23.9, adult: Secondary | ICD-10-CM | POA: Diagnosis not present

## 2017-08-09 ENCOUNTER — Encounter: Payer: Self-pay | Admitting: Gastroenterology

## 2017-08-11 DIAGNOSIS — H40053 Ocular hypertension, bilateral: Secondary | ICD-10-CM | POA: Diagnosis not present

## 2017-08-17 ENCOUNTER — Other Ambulatory Visit: Payer: Self-pay | Admitting: Interventional Cardiology

## 2017-08-18 ENCOUNTER — Other Ambulatory Visit: Payer: Self-pay | Admitting: Interventional Cardiology

## 2017-08-24 ENCOUNTER — Ambulatory Visit: Payer: Medicare Other | Admitting: Interventional Cardiology

## 2017-08-25 ENCOUNTER — Other Ambulatory Visit: Payer: Self-pay | Admitting: Interventional Cardiology

## 2017-08-25 NOTE — Telephone Encounter (Signed)
Pt's pharmacy is requesting a change for irbesartan 75 mg tablet. This medication is on backorder and pharmacy would like an alternative, valsartan 80 mg tablet, to replace the irbesartan. Would Dr. Tamala Julian like to make this change? Please address

## 2017-08-30 DIAGNOSIS — F4322 Adjustment disorder with anxiety: Secondary | ICD-10-CM | POA: Diagnosis not present

## 2017-09-07 ENCOUNTER — Telehealth: Payer: Self-pay | Admitting: *Deleted

## 2017-09-07 NOTE — Telephone Encounter (Signed)
Josh Monday, CRNA reviewed echo results. Pt ok for colonoscopy at [facility].

## 2017-09-11 NOTE — Progress Notes (Signed)
Cardiology Office Note:    Date:  09/12/2017   ID:  Amanda Cannon, DOB 1945-08-10, MRN [id number]  PCP:  Lucretia Kern, DO  Cardiologist:  No primary care provider on file.   Referring MD: Lucretia Kern, DO   Chief Complaint  Patient presents with  . Hyperlipidemia  . Cardiac Valve Problem    History of Present Illness:    Amanda Cannon is a 72 y.o. female with a hx of history of hypertension, hyperlipidemia, Prinzmetal's Angina, and remote history of mitral valve prolapse.   With a significant clinical problems include polymyalgia rheumatica.  Feels she is doing relatively well from the cardiac standpoint.  Her major complaints have to do more with polymyalgia rheumatica.  She has occasional palpitations.  She has her usual spasm related chest pain "brief left focal upper parasternal tightness with radiation into the left arm" that usually lasts less than 30 seconds.  Not precipitated by physical activity.  Glycerin use is not required.  She has noticed some ankle swelling.  Some difficulty with walking induced leg pain.  No voiced side effects to her current medical regimen.   Past Medical History:  Diagnosis Date  . Alcohol use   . Anemia   . Anxiety   . Asthma   . Basal cell carcinoma    forehead  . Breast cancer ([place]) 1998   left   . Colon polyps   . GERD (gastroesophageal reflux disease)   . Glaucoma   . Hyperlipidemia   . Hypertension   . Hyponatremia    remote, mild  . Mitral valve prolapse   . Osteoarthritis    sees rheumatologist  . Osteoporosis    sees rheumatologist  . Personal history of radiation therapy 1998  . PMR (polymyalgia rheumatica) (HCC)    sees rheumatologist  . Pneumonia   . Prinzmetal angina ([place])   . White matter changes    ? ischemic sm vs per pt    Past Surgical History:  Procedure Laterality Date  . ABDOMINAL HYSTERECTOMY    . BREAST LUMPECTOMY Left 1998   cancer  . CESAREAN SECTION     x 4  . HERNIA REPAIR  2005   abdominal hernia repair  . HYSTEROTOMY    . LYMPH NODE DISSECTION Left   . MASTECTOMY, PARTIAL Left     Current Medications: Current Meds  Medication Sig  . Alirocumab (PRALUENT) 75 MG/ML SOPN Inject 1 pen into the skin every 14 (fourteen) days.  Marland Kitchen amLODipine (NORVASC) 5 MG tablet Take 5 mg by mouth daily.  Marland Kitchen aspirin 81 MG tablet Take 81 mg by mouth daily.  . cetirizine (ZYRTEC) 10 MG tablet Take 10 mg by mouth 2 (two) times daily.   . Cyanocobalamin (B-12 PO) Take by mouth.  . denosumab (PROLIA) 60 MG/ML SOLN injection Inject 60 mg into the skin every 6 (six) months. Administer in upper arm, thigh, or abdomen  . esomeprazole (NEXIUM) 40 MG capsule TAKE ONE CAPSULE BY MOUTH EVERY DAY  . furosemide (LASIX) 20 MG tablet Take 0.5 tablets (10 mg total) by mouth 3 (three) times a week.  . IRON PO Take 1 tablet by mouth daily.  Marland Kitchen LORazepam (ATIVAN) 1 MG tablet Take 1 mg by mouth at bedtime as needed (spasms).  Marland Kitchen MAGNESIUM PO Take 400 mg by mouth daily.   . valsartan (DIOVAN) 80 MG tablet Take 1 tablet (80 mg total) by mouth daily.     Allergies:   Tylenol [acetaminophen]; Turmeric;  Erythromycin; Erythromycin base; Garlic; Other; and Sulfa antibiotics   Social History   Socioeconomic History  . Marital status: Married    Spouse name: Not on file  . Number of children: 3  . Years of education: Not on file  . Highest education level: Not on file  Occupational History  . Occupation: Psychologist, occupational  Social Needs  . Financial resource strain: Not on file  . Food insecurity:    Worry: Not on file    Inability: Not on file  . Transportation needs:    Medical: Not on file    Non-medical: Not on file  Tobacco Use  . Smoking status: Former Smoker    Packs/day: 1.00    Years: 7.00    Pack years: 7.00    Last attempt to quit: 01/25/1971    Years since quitting: 46.6  . Smokeless tobacco: Never Used  Substance and Sexual Activity  . Alcohol use: Yes    Alcohol/week: 0.0 standard drinks     Comment: two scotches per day   . Drug use: No  . Sexual activity: Not on file  Lifestyle  . Physical activity:    Days per week: Not on file    Minutes per session: Not on file  . Stress: Not on file  Relationships  . Social connections:    Talks on phone: Not on file    Gets together: Not on file    Attends religious service: Not on file    Active member of club or organization: Not on file    Attends meetings of clubs or organizations: Not on file    Relationship status: Not on file  Other Topics Concern  . 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  ROS:   Please see the history of present illness.    Weight gain, loss of hearing, dizziness especially with sudden change in position/direction.  Also complains that the left hemicranium and face feels numb.  This is been present for years.  Has ringing and buzzing in her ears.  Cough, abdominal pain, neck and shoulder pain, and facial swelling.  All other systems reviewed and are negative.  EKGs/Labs/Other Studies Reviewed:    The following studies were reviewed today: No new data with the exception of a quantitative CRP of 123 performed March 2019.  Measured during a flare of polymyalgia rheumatica.  EKG:  EKG is  ordered today.  The ekg ordered today demonstrates normal sinus rhythm with nonspecific T wave flattening but overall normal in appearance and unchanged from prior tracings.  Recent Labs: 11/24/2016: ALT 23 05/15/2017: BUN 21; Creatinine, Ser 0.81; Hemoglobin 13.2; Platelets 331.0; Potassium 4.5; Sodium 133  Recent Lipid Panel    Component Value Date/Time   CHOL  153 11/24/2016 0744   TRIG 150 (H) 11/24/2016 0744   TRIG 174 (H) 06/24/2016 0848   HDL 79 11/24/2016 0744   HDL 74 06/24/2016 0848   CHOLHDL 1.9 11/24/2016 0744   LDLCALC 44 11/24/2016 0744    Physical Exam:    VS:  BP 130/84   Pulse 92  Ht 4\' 8"  (1.422 m)   Wt 107 lb 6.4 oz (48.7 kg)   BMI 24.08 kg/m     Wt Readings from Last 3 Encounters:  09/12/17 107 lb 6.4 oz (48.7 kg)  05/15/17 106 lb (48.1 kg)  04/10/17 103 lb 4 oz (46.8 kg)     GEN:  Well nourished, well developed in no acute distress HEENT: Normal NECK: No JVD. LYMPHATICS: No lymphadenopathy CARDIAC: RRR, no murmur, no gallop, no  edema. VASCULAR: 2+ pulses. no bruits. RESPIRATORY:  Clear to auscultation without rales, wheezing or rhonchi  ABDOMEN: Soft, non-tender, non-distended, No pulsatile mass, MUSCULOSKELETAL: No deformity  SKIN: Warm and dry NEUROLOGIC:  Alert and oriented x 3 PSYCHIATRIC:  Normal affect   ASSESSMENT:    1. Prinzmetal angina (HCC)   2. Mitral valve prolapse   3. Familial hypercholesterolemia   4. Essential hypertension    PLAN:    In order of problems listed above:  1. Pre-existing diagnosis without any change in historical symptoms.  She does not even use nitroglycerin for episodes of chest discomfort which are very brief and in my honest opinion may not be ischemia related. 2. Previously echo documented mild mitral valve prolapse and regurgitation.  No study in our system and diagnosis is dependent upon previous studies done in [place].  Asymptomatic at this time. 3. On Praluent 75 mg/mL every 14 days.  Target LDL less than 70.  Most recent LDL 44 November 2018 4. Target blood pressure 130/80 mmHg.  Encouraged aerobic activity, low-fat diet, and medical therapy compliance.   Medication Adjustments/Labs and Tests Ordered: Current medicines are reviewed at length with the patient today.  Concerns regarding medicines are outlined above.  No orders of the defined types were  placed in this encounter.  No orders of the defined types were placed in this encounter.   Patient Instructions  Medication Instructions:  Your physician recommends that you continue on your current medications as directed. Please refer to the Current Medication list given to you today.  Labwork: None  Testing/Procedures: None  Follow-Up: Your physician wants you to follow-up in: 1 year with Dr. Tamala Julian.  You will receive a reminder letter in the mail two months in advance. If you don't receive a letter, please call our office to schedule the follow-up appointment.   Any Other Special Instructions Will Be Listed Below (If Applicable).     If you need a refill on your cardiac medications before your next appointment, please call your pharmacy.      Signed, Sinclair Grooms, MD  09/12/2017 12:45 PM    [place]

## 2017-09-12 ENCOUNTER — Ambulatory Visit (INDEPENDENT_AMBULATORY_CARE_PROVIDER_SITE_OTHER): Payer: Medicare Other | Admitting: Interventional Cardiology

## 2017-09-12 ENCOUNTER — Encounter: Payer: Self-pay | Admitting: Interventional Cardiology

## 2017-09-12 VITALS — BP 130/84 | HR 92 | Ht <= 58 in | Wt 107.4 lb

## 2017-09-12 DIAGNOSIS — E7801 Familial hypercholesterolemia: Secondary | ICD-10-CM

## 2017-09-12 DIAGNOSIS — I201 Angina pectoris with documented spasm: Secondary | ICD-10-CM | POA: Diagnosis not present

## 2017-09-12 DIAGNOSIS — I341 Nonrheumatic mitral (valve) prolapse: Secondary | ICD-10-CM

## 2017-09-12 DIAGNOSIS — I10 Essential (primary) hypertension: Secondary | ICD-10-CM | POA: Diagnosis not present

## 2017-09-12 NOTE — Patient Instructions (Signed)

## 2017-09-13 ENCOUNTER — Ambulatory Visit (AMBULATORY_SURGERY_CENTER): Payer: Self-pay | Admitting: *Deleted

## 2017-09-13 VITALS — Ht <= 58 in | Wt 108.0 lb

## 2017-09-13 DIAGNOSIS — Z86010 Personal history of colonic polyps: Secondary | ICD-10-CM

## 2017-09-13 MED ORDER — NA SULFATE-K SULFATE-MG SULF 17.5-3.13-1.6 GM/177ML PO SOLN: ORAL | 0 refills | Status: DC

## 2017-09-13 NOTE — Progress Notes (Signed)
Patient denies any allergies to eggs or soy. Patient denies any problems with anesthesia/sedation. Patient denies any oxygen use at home. Patient denies taking any diet/weight loss medications or blood thinners. EMMI education offered, pt declined.  

## 2017-09-13 NOTE — Progress Notes (Signed)
HPI:  Using dictation device. Unfortunately this device frequently misinterprets words/phrases.  Acute visit for:  Facial numbness: -started "years ago", progressively worsening slowly and now reports "I cannot take it anymore" -Symptoms include a numb feeling over the left upper side of her face, tingling sensation when she touches this area -She has seen an ear nose and throat doctor for this, remotely saw a neurologist for something else and had an MRI of the brain showing microvascular disease -Reports she saw several neurologist regarding the microvascular disease and was said that this is common and not a concern, one told her she should take Plavix -reports she did not want to take Plavix.  She is acholesterol-lowering medication with her cardiologist and also aspirin. -Denies weakness, vision changes, speech changes, real pain or HA - just discomfort -History of facelift, but is not sure if this started before or after  Hypertension: -Needs refill on her Norvasc -No chest pain, shortness of breath or swelling reported, saw her cardiologist recently  ROS: See pertinent positives and negatives per HPI.  Past Medical History:  Diagnosis Date  . Alcohol use   . Anemia   . Anxiety   . Asthma   . Basal cell carcinoma    forehead  . Breast cancer ([place]) 1998   left   . Colon polyps   . GERD (gastroesophageal reflux disease)   . Glaucoma   . Hyperlipidemia   . Hypertension   . Hyponatremia    remote, mild  . Mitral valve prolapse   . Osteoarthritis    sees rheumatologist  . Osteoporosis    sees rheumatologist  . Personal history of radiation therapy 1998  . PMR (polymyalgia rheumatica) (HCC)    sees rheumatologist  . Pneumonia   . Prinzmetal angina ([place])   . White matter changes    ? ischemic sm vs per pt    Past Surgical History:  Procedure Laterality Date  . ABDOMINAL HYSTERECTOMY    . BREAST LUMPECTOMY Left 1998   cancer  . CESAREAN SECTION     x 4  .  COLONOSCOPY  07/2012   in [place]  . HERNIA REPAIR  2005   abdominal hernia repair  . HYSTEROTOMY    . LYMPH NODE DISSECTION Left   . MASTECTOMY, PARTIAL Left   . POLYPECTOMY      Family History  Problem Relation Age of Onset  . Coronary artery disease Mother   . Coronary artery disease Father   . Cancer - Ovarian Sister   . Colon cancer Cousin        x 2  . Esophageal cancer Neg Hx   . Rectal cancer Neg Hx   . Stomach cancer Neg Hx     SOCIAL HX: se ehpi   Current Outpatient Medications:  .  Alirocumab (PRALUENT) 75 MG/ML SOPN, Inject 1 pen into the skin every 14 (fourteen) days., Disp: 2 pen, Rfl: 11 .  amLODipine (NORVASC) 5 MG tablet, Take 1 tablet (5 mg total) by mouth daily., Disp: 90 tablet, Rfl: 3 .  aspirin 81 MG tablet, Take 81 mg by mouth daily., Disp: , Rfl:  .  cetirizine (ZYRTEC) 10 MG tablet, Take 10 mg by mouth 2 (two) times daily. , Disp: , Rfl:  .  Cyanocobalamin (B-12 PO), Take by mouth., Disp: , Rfl:  .  denosumab (PROLIA) 60 MG/ML SOLN injection, Inject 60 mg into the skin every 6 (six) months. Administer in upper arm, thigh, or abdomen, Disp: , Rfl:  .  esomeprazole (NEXIUM) 40 MG capsule, TAKE ONE CAPSULE BY MOUTH EVERY DAY, Disp: 30 capsule, Rfl: 0 .  furosemide (LASIX) 20 MG tablet, Take 0.5 tablets (10 mg total) by mouth 3 (three) times a week., Disp: 18 tablet, Rfl: 3 .  IRON PO, Take 1 tablet by mouth daily., Disp: , Rfl:  .  LORazepam (ATIVAN) 1 MG tablet, Take 1 mg by mouth at bedtime as needed (spasms)., Disp: , Rfl:  .  MAGNESIUM PO, Take 400 mg by mouth daily. , Disp: , Rfl:  .  Na Sulfate-K Sulfate-Mg Sulf 17.5-3.13-1.6 GM/177ML SOLN, Suprep (no substitutions)-TAKE AS DIRECTED., Disp: 354 mL, Rfl: 0 .  valsartan (DIOVAN) 80 MG tablet, Take 1 tablet (80 mg total) by mouth daily., Disp: 90 tablet, Rfl: 0  EXAM:  Vitals:   09/14/17 0725  BP: 118/80  Pulse: 85  Temp: 98.4 F (36.9 C)    Body mass index is 23.87 kg/m.  GENERAL: vitals  reviewed and listed above, alert, oriented, appears well hydrated and in no acute distress  HEENT: atraumatic, conjunttiva clear, no obvious abnormalities on inspection of external nose and ears  NECK: no obvious masses on inspection  LUNGS: clear to auscultation bilaterally, no wheezes, rales or rhonchi, good air movement  CV: HRRR, no peripheral edema  MS: moves all extremities without noticeable abnormality  PSYCH/NEURO: pleasant and cooperative, no obvious depression or anxiety, cranial nerves II through XII grossly intact except for that she perceives some tingling with testing of sensitivity to light touch over the V1 distribution, speech and thought processing grossly intact, finger-to-nose normal, gait normal  ASSESSMENT AND PLAN:  Discussed the following assessment and plan:  Paresthesia - Plan: Ambulatory referral to Neurology -we discussed possible serious and likely etiologies, workup and treatment, treatment risks and return precautions -after this discussion, Markitta opted for referral to neurology - placed, will likely need re-imaging, but she prefers to see what neurology advises -of course, we advised Roanna  to return or notify a doctor immediately if symptoms worsen or persist or new concerns arise.  Cerebral microvascular disease -seeing cardiology, will see neurology -on asa and cholesterol lowering medication with cardiology  Essential hypertension -norvasc refilled  AWV/follow up in Oct-Nov as planned -Patient advised to return or notify a doctor immediately if symptoms worsen or persist or new concerns arise.  Patient Instructions  Ensure has follow-up and annual wellness visit in October or November  -We placed a referral for you as discussed. It usually takes about 1-2 weeks to process and schedule this referral. If you have not heard from [place] regarding this appointment in 2 weeks please contact our office.  I sent a referral of your Norvasc to the  pharmacy   Lucretia Kern, DO

## 2017-09-14 ENCOUNTER — Encounter: Payer: Self-pay | Admitting: Family Medicine

## 2017-09-14 ENCOUNTER — Ambulatory Visit (INDEPENDENT_AMBULATORY_CARE_PROVIDER_SITE_OTHER): Payer: Medicare Other | Admitting: Family Medicine

## 2017-09-14 VITALS — BP 118/80 | HR 85 | Temp 98.4°F | Ht <= 58 in | Wt 107.4 lb

## 2017-09-14 DIAGNOSIS — R202 Paresthesia of skin: Secondary | ICD-10-CM | POA: Diagnosis not present

## 2017-09-14 DIAGNOSIS — I679 Cerebrovascular disease, unspecified: Secondary | ICD-10-CM

## 2017-09-14 DIAGNOSIS — I6789 Other cerebrovascular disease: Secondary | ICD-10-CM

## 2017-09-14 DIAGNOSIS — I10 Essential (primary) hypertension: Secondary | ICD-10-CM | POA: Diagnosis not present

## 2017-09-14 DIAGNOSIS — I201 Angina pectoris with documented spasm: Secondary | ICD-10-CM | POA: Diagnosis not present

## 2017-09-14 MED ORDER — AMLODIPINE BESYLATE 5 MG PO TABS: 5.0000 mg | ORAL_TABLET | Freq: Every day | ORAL | 3 refills | Status: DC

## 2017-09-14 NOTE — Patient Instructions (Signed)
Ensure has follow-up and annual wellness visit in October or November  -We placed a referral for you as discussed. It usually takes about 1-2 weeks to process and schedule this referral. If you have not heard from [place] regarding this appointment in 2 weeks please contact our office.  I sent a referral of your Norvasc to the pharmacy

## 2017-09-15 ENCOUNTER — Encounter: Payer: Self-pay | Admitting: Family Medicine

## 2017-09-27 ENCOUNTER — Ambulatory Visit (AMBULATORY_SURGERY_CENTER): Payer: Medicare Other | Admitting: Gastroenterology

## 2017-09-27 ENCOUNTER — Encounter: Payer: Self-pay | Admitting: Gastroenterology

## 2017-09-27 VITALS — BP 127/71 | HR 68 | Temp 97.5°F | Resp 16 | Ht 58.25 in | Wt 107.0 lb

## 2017-09-27 DIAGNOSIS — Z86010 Personal history of colonic polyps: Secondary | ICD-10-CM

## 2017-09-27 DIAGNOSIS — Z1211 Encounter for screening for malignant neoplasm of colon: Secondary | ICD-10-CM | POA: Diagnosis not present

## 2017-09-27 MED ORDER — SODIUM CHLORIDE 0.9 % IV SOLN: 500.0000 mL | Freq: Once | INTRAVENOUS | Status: DC

## 2017-09-27 NOTE — Patient Instructions (Signed)
**   Handout given on diverticulosis **   YOU HAD AN ENDOSCOPIC PROCEDURE TODAY AT THE [facility]:   Refer to the procedure report that was given to you for any specific questions about what was found during the examination.  If the procedure report does not answer your questions, please call your gastroenterologist to clarify.  If you requested that your care partner not be given the details of your procedure findings, then the procedure report has been included in a sealed envelope for you to review at your convenience later.  YOU SHOULD EXPECT: Some feelings of bloating in the abdomen. Passage of more gas than usual.  Walking can help get rid of the air that was put into your GI tract during the procedure and reduce the bloating. If you had a lower endoscopy (such as a colonoscopy or flexible sigmoidoscopy) you may notice spotting of blood in your stool or on the toilet paper. If you underwent a bowel prep for your procedure, you may not have a normal bowel movement for a few days.  Please Note:  You might notice some irritation and congestion in your nose or some drainage.  This is from the oxygen used during your procedure.  There is no need for concern and it should clear up in a day or so.  SYMPTOMS TO REPORT IMMEDIATELY:   Following lower endoscopy (colonoscopy or flexible sigmoidoscopy):  Excessive amounts of blood in the stool  Significant tenderness or worsening of abdominal pains  Swelling of the abdomen that is new, acute  Fever of 100F or higher  For urgent or emergent issues, a gastroenterologist can be reached at any hour by calling [phone].   DIET:  We do recommend a small meal at first, but then you may proceed to your regular diet.  Drink plenty of fluids but you should avoid alcoholic beverages for 24 hours.  ACTIVITY:  You should plan to take it easy for the rest of today and you should NOT DRIVE or use heavy machinery until tomorrow (because of the  sedation medicines used during the test).    FOLLOW UP: Our staff will call the number listed on your records the next business day following your procedure to check on you and address any questions or concerns that you may have regarding the information given to you following your procedure. If we do not reach you, we will leave a message.  However, if you are feeling well and you are not experiencing any problems, there is no need to return our call.  We will assume that you have returned to your regular daily activities without incident.  If any biopsies were taken you will be contacted by phone or by letter within the next 1-3 weeks.  Please call us at [phone] if you have not heard about the biopsies in 3 weeks.    SIGNATURES/CONFIDENTIALITY: You and/or your care partner have signed paperwork which will be entered into your electronic medical record.  These signatures attest to the fact that that the information above on your After Visit Summary has been reviewed and is understood.  Full responsibility of the confidentiality of this discharge information lies with you and/or your care-partner. 

## 2017-09-27 NOTE — Op Note (Signed)
[facility] Patient Name: Amanda Cannon Procedure Date: 09/27/2017 9:23 AM MRN: [id number] Endoscopist: Ladene Artist , MD Age: 72 Referring MD:  Date of Birth: 18-Jul-1945 Gender: Female Account #: [id number] Procedure:                Colonoscopy Indications:              Surveillance: Personal history of adenomatous                            polyps on last colonoscopy 5 years ago Medicines:                Monitored Anesthesia Care Procedure:                Pre-Anesthesia Assessment:                           - Prior to the procedure, a History and Physical                            was performed, and patient medications and                            allergies were reviewed. The patient's tolerance of                            previous anesthesia was also reviewed. The risks                            and benefits of the procedure and the sedation                            options and risks were discussed with the patient.                            All questions were answered, and informed consent                            was obtained. Prior Anticoagulants: The patient has                            taken no previous anticoagulant or antiplatelet                            agents. ASA Grade Assessment: II - A patient with                            mild systemic disease. After reviewing the risks                            and benefits, the patient was deemed in                            satisfactory condition to undergo the procedure.  After obtaining informed consent, the colonoscope                            was passed under direct vision. Throughout the                            procedure, the patient's blood pressure, pulse, and                            oxygen saturations were monitored continuously. The                            Model [id number]-[id number]) scope was introduced                            through the anus and  advanced to the the cecum,                            identified by appendiceal orifice and ileocecal                            valve. The ileocecal valve, appendiceal orifice,                            and rectum were photographed. The quality of the                            bowel preparation was excellent. The colonoscopy                            was performed without difficulty. The patient                            tolerated the procedure well. Scope In: 9:27:54 AM Scope Out: 9:38:48 AM Scope Withdrawal Time: 0 hours 7 minutes 45 seconds  Total Procedure Duration: 0 hours 10 minutes 54 seconds  Findings:                 The perianal and digital rectal examinations were                            normal.                           Multiple small-mouthed diverticula were found in                            the left colon.                           The exam was otherwise without abnormality on                            direct and retroflexion views. Complications:            No immediate complications. Estimated blood loss:  None. Estimated Blood Loss:     Estimated blood loss: none. Impression:               - Diverticulosis in the left colon.                           - The examination was otherwise normal on direct                            and retroflexion views.                           - No specimens collected. Recommendation:           - Repeat colonoscopy in 5 years for surveillance.                           - Patient has a contact number available for                            emergencies. The signs and symptoms of potential                            delayed complications were discussed with the                            patient. Return to normal activities tomorrow.                            Written discharge instructions were provided to the                            patient.                           - High fiber diet.                            - Continue present medications. Ladene Artist, MD 09/27/2017 9:42:05 AM This report has been signed electronically.

## 2017-09-27 NOTE — Progress Notes (Signed)
To PACU, VSS. Report to [email or web address] 

## 2017-09-28 ENCOUNTER — Telehealth: Payer: Self-pay | Admitting: *Deleted

## 2017-09-28 NOTE — Telephone Encounter (Signed)
  Follow up Call-  Call back number 09/27/2017  Post procedure Call Back phone  # [phone]  Permission to leave phone message Yes  Some recent data might be hidden     Patient questions:  Do you have a fever, pain , or abdominal swelling? No. Pain Score  0 *  Have you tolerated food without any problems? Yes.    Have you been able to return to your normal activities? Yes.    Do you have any questions about your discharge instructions: Diet   No. Medications  No. Follow up visit  No.  Do you have questions or concerns about your Care? No.  Actions: * If pain score is 4 or above: No action needed, pain <4.

## 2017-10-30 DIAGNOSIS — M810 Age-related osteoporosis without current pathological fracture: Secondary | ICD-10-CM | POA: Diagnosis not present

## 2017-11-03 ENCOUNTER — Telehealth: Payer: Self-pay | Admitting: Interventional Cardiology

## 2017-11-03 DIAGNOSIS — E7849 Other hyperlipidemia: Secondary | ICD-10-CM

## 2017-11-03 NOTE — Telephone Encounter (Signed)
New Message         Patient is calling today to get a authorization for a lipitor/lipid profile? Was unsure about what she was really needing. Pls call and advise.

## 2017-11-03 NOTE — Telephone Encounter (Signed)
Spoke with pt and advised ok to check cholesterol labs.  Pt going out of town soon for 6 weeks.  She will come 10/16 for labs.  Pt appreciative for call.

## 2017-11-06 DIAGNOSIS — M810 Age-related osteoporosis without current pathological fracture: Secondary | ICD-10-CM | POA: Diagnosis not present

## 2017-11-08 ENCOUNTER — Other Ambulatory Visit: Payer: Medicare Other | Admitting: *Deleted

## 2017-11-08 DIAGNOSIS — E7849 Other hyperlipidemia: Secondary | ICD-10-CM

## 2017-11-08 LAB — LIPID PANEL
CHOL/HDL RATIO: 2.1 ratio (ref 0.0–4.4)
Cholesterol, Total: 185 mg/dL (ref 100–199)
HDL: 88 mg/dL (ref >39)
LDL Calculated: 70 mg/dL (ref 0–99)
Triglycerides: 137 mg/dL (ref 0–149)
VLDL Cholesterol Cal: 27 mg/dL (ref 5–40)

## 2017-11-08 LAB — HEPATIC FUNCTION PANEL
ALBUMIN: 4.8 g/dL (ref 3.5–4.8)
ALT: 16 IU/L (ref 0–32)
AST: 23 IU/L (ref 0–40)
Alkaline Phosphatase: 55 IU/L (ref 39–117)
BILIRUBIN TOTAL: 0.5 mg/dL (ref 0.0–1.2)
Bilirubin, Direct: 0.15 mg/dL (ref 0.00–0.40)
Total Protein: 7 g/dL (ref 6.0–8.5)

## 2017-11-08 NOTE — Progress Notes (Signed)
HPI:  Using dictation device. Unfortunately this device frequently misinterprets words/phrases.  Amanda Cannon is a pleasant 72 y.o. here for follow up. Chronic medical problems summarized below were reviewed for changes and stability and were updated as needed below. These issues and their treatment remain stable for the most part.  Reports is doing well for the most part.  She is really healthy and her cholesterol has been great.  She also is very happy about the osteoporosis treatment she is doing with Dr. Amil Amen.  Reports she had a BMP they recently.  We do not have these results today.  She does like to check a hepatitis C screening, she reports she has never done this and also a CBC as this was not done elsewhere.  She has a few little issues she just wants me to know about.  Reports she has a lot of lipomas.  Has noticed a small bump just under the skin in her lower left abdomen, this is not tender, firm or changing.  Chronic mild le edema. Does not use compression. Takes lasix. She was seeing Dr. Lavell Anchors, neurology for the long-term facial numbness tomorrow.  She is hopeful she may get an answer.  She has seen a number this in the past.  Denies CP, SOB, DOE, treatment intolerance or new symptoms.  Declined tetanus booster. Will come back for labs  She had her AWV with Amanda Cannon today  MVP/HTN/Prinzmetal angina/HLD/CAD: -seeing Dr. Tamala Julian, cardiology -lipids done 10/2015 - quite high -does not tolerate statins, seeing lipid clinic -meds: praluent, norvasc, asa 81 mg, nexium 87m, lasix, irbesartan, ativan for prinzmetal dzin the past -regular exercise, diet is pretty good  Chronic paresthesias: -started "years ago" -L sided upper face tingling -reportedly had eval with MRI (microvascular dz) ENT and several neurologist -seeing cardiology for cholesterol, on asa -referred to neurology  Hx PMR/chronic muscle spasms/elevated CRP/ESR/Osteoporosis/Osteoarthritis: -seeing Dr. BAmil Amenq  661month-on prolia -lots of random aches and painschronically  GERD/stomach aches: - sees GI  -intermittent loose and hard stools -drinks several alcoholic drinks per day - I had difficulty getting her to really give me a number but she reports drinks scotch mixed with water as restricts water due to remote dx mild hyponatremia.  Reports she feels the alcohol actually helps her health.  Hx Breast ca: -1998 -neg bracaper her report -s/p partial mastectomy L breast, hysterectomy and oophorectomy  Seasonal allergies: -takes zyrtec and this helps  Depression/Anxiety: -remotely had depression -fine since off bb- feels bb caused depression -hx alcohol use    ROS: See pertinent positives and negatives per HPI.  Past Medical History:  Diagnosis Date  . Alcohol use   . Anemia   . Anxiety   . Asthma   . Basal cell carcinoma    forehead  . [place]   left   . Colon polyps   . GERD (gastroesophageal reflux disease)   . Glaucoma   . Hyperlipidemia   . Hypertension   . Hyponatremia    remote, mild  . Mitral valve prolapse   . Osteoarthritis    sees rheumatologist  . Osteoporosis    sees rheumatologist  . Personal history of radiation therapy 1998  . PMR (polymyalgia rheumatica) ([facility])    sees rheumatologist  . Pneumonia   . Prinzmetal angina ([facility]  . White matter changes    ? ischemic sm vs per pt    Past Surgical History:  Procedure Laterality Date  . ABDOMINAL HYSTERECTOMY    .  BREAST LUMPECTOMY Left 1998   cancer  . CESAREAN SECTION     x 4  . COLONOSCOPY  07/2012   in [place]  . HERNIA REPAIR  2005   abdominal hernia repair  . HYSTEROTOMY    . LYMPH NODE DISSECTION Left   . MASTECTOMY, PARTIAL Left   . POLYPECTOMY      Family History  Problem Relation Age of Onset  . Coronary artery disease Mother   . Coronary artery disease Father   . Cancer - Ovarian Sister   . Colon cancer Cousin        x 2  . Esophageal cancer Neg Hx   .  Rectal cancer Neg Hx   . Stomach cancer Neg Hx     SOCIAL HX: see hpi   Current Outpatient Medications:  .  Alirocumab (PRALUENT) 75 MG/ML SOPN, Inject 1 pen into the skin every 14 (fourteen) days., Disp: 2 pen, Rfl: 11 .  amLODipine (NORVASC) 5 MG tablet, Take 1 tablet (5 mg total) by mouth daily., Disp: 90 tablet, Rfl: 3 .  aspirin 81 MG tablet, Take 81 mg by mouth daily., Disp: , Rfl:  .  cetirizine (ZYRTEC) 10 MG tablet, Take 10 mg by mouth 2 (two) times daily. , Disp: , Rfl:  .  Cyanocobalamin (B-12 PO), Take by mouth., Disp: , Rfl:  .  denosumab (PROLIA) 60 MG/ML SOLN injection, Inject 60 mg into the skin every 6 (six) months. Administer in upper arm, thigh, or abdomen, Disp: , Rfl:  .  esomeprazole (NEXIUM) 40 MG capsule, TAKE ONE CAPSULE BY MOUTH EVERY DAY, Disp: 30 capsule, Rfl: 0 .  furosemide (LASIX) 20 MG tablet, Take 0.5 tablets (10 mg total) by mouth 3 (three) times a week., Disp: 18 tablet, Rfl: 3 .  IRON PO, Take 1 tablet by mouth daily., Disp: , Rfl:  .  LORazepam (ATIVAN) 1 MG tablet, Take 1 mg by mouth at bedtime as needed (spasms)., Disp: , Rfl:  .  MAGNESIUM PO, Take 400 mg by mouth daily. , Disp: , Rfl:  .  valsartan (DIOVAN) 80 MG tablet, Take 1 tablet (80 mg total) by mouth daily., Disp: 90 tablet, Rfl: 0  EXAM:  Vitals:   11/09/17 0905  BP: 110/70  Pulse: 79  Temp: 98.4 F (36.9 C)    Body mass index is 23.57 kg/m.  GENERAL: vitals reviewed and listed above, alert, oriented, appears well hydrated and in no acute distress  HEENT: atraumatic, conjunttiva clear, no obvious abnormalities on inspection of external nose and ears  NECK: no obvious masses on inspection  LUNGS: clear to auscultation bilaterally, no wheezes, rales or rhonchi, good air movement  CV: HRRR, no peripheral edema  ABD: sugical scar with scar tissue, area she is concerned about has small, soft mobile subcut mass approx 1 cm in diameter  MS: moves all extremities without  noticeable abnormality  PSYCH: pleasant and cooperative, no obvious depression or anxiety  ASSESSMENT AND PLAN:  Discussed the following assessment and plan:  1. Other hyperlipidemia -sees lipid clinic/cards for management, doing well  2. Essential hypertension -stable - CBC; Future -advised compression socks, elevation for mild LE edema  3. Hyponatremia -reports normalized  4. Need for hepatitis C screening test - Hepatitis C antibody; Future  5. Mass of anterior abdominal wall -discussed potential etiologies -suspect lipoma or scar tissue, offered surgery eval, she declined - advised to let us know if changes, symptoms or wants eval  6. PMR (polymyalgia rheumatica) (  [place]) -sees specialist for managment  7. Osteoporosis, unspecified osteoporosis type, unspecified pathological fracture presence -sees specialist for managment  8. Prinzmetal angina [facility]) -sees cardiology  Advised to cut back on alcohol. She feels helps her health. Had LFTs recently. Did discuss for most women more then 1 drink per day could cause health risks.  -Patient advised to return or notify a doctor immediately if symptoms worsen or persist or new concerns arise.  Patient Instructions  BEFORE YOU LEAVE: -lab appointment -follow up: 4-6 months  Can you send [place] the BMP results?  Consider tetanus booster if not done elsewhere.  I am glad you will be seeing Dr. Jaynee Eagles.   We recommend the following healthy lifestyle for LIFE: 1) Small portions. But, make sure to get regular (at least 3 per day), healthy meals and small healthy snacks if needed.  2) Eat a healthy clean diet.   TRY TO EAT: -at least 5-7 servings of low sugar, colorful, and nutrient rich vegetables per day (not corn, potatoes or bananas.) -berries are the best choice if you wish to eat fruit (only eat small amounts if trying to reduce weight)  -lean meets (fish, white meat of chicken or [place]) -vegan proteins for some meals -  beans or tofu, whole grains, nuts and seeds -Replace bad fats with good fats - good fats include: fish, nuts and seeds, canola oil, olive oil -small amounts of low fat or non fat dairy -small amounts of100 % whole grains - check the lables -drink plenty of water  AVOID: -SUGAR, sweets, anything with added sugar, corn syrup or sweeteners - must read labels as even foods advertised as "healthy" often are loaded with sugar -if you must have a sweetener, small amounts of stevia may be best -sweetened beverages and artificially sweetened beverages -simple starches (rice, bread, potatoes, pasta, chips, etc - small amounts of 100% whole grains are ok) -red meat, pork, butter -fried foods, fast food, processed food, excessive dairy, eggs and coconut.  3)Get at least 150 minutes of sweaty aerobic exercise per week.  4)Reduce stress - consider counseling, meditation and relaxation to balance other aspects of your life.       Lucretia Kern, DO

## 2017-11-08 NOTE — Progress Notes (Signed)
Subjective:   Amanda Cannon is a 72 y.o. female who presents for Medicare Annual (Subsequent) preventive examination.  Reports health as good  9:00 with Dr Maudie Mercury   Diet Chol/hdl 2.1 a1c 5.6  Diet  States she does not eat a lot Have an egg for breakfast;  Bread with cream cheese Eats peanuts;  Sometimes has atkins  States she eats fruits and vegetables    BMI 23   Exercise Used to go to the gym;  Occasionally goes to the gym She does walk 1/4 of a mile Was walking 3 miles a day when in [facility] Has mild asthma; which is exercise induced   Just complete order for hearing screen at [facility]  Completed last year  Former smoker 36' with 7 pack years   Health Maintenance Due  Topic Date Due  . TETANUS/TDAP  10/28/1963   Colonoscopy 09/2017 Mammogram 07/2017 Bone density 12/2015 -2.4 much improved on prolia   Pneumonia vaccines are noted as completed    Cardiac Risk Factors include: advanced age (>100mn, >>58women);dyslipidemia;family history of premature cardiovascular disease;hypertension     Objective:     Vitals: BP 110/70   Pulse 79   Ht 4' 8.5" (1.435 m)   Wt 107 lb 2 oz (48.6 kg)   SpO2 98%   BMI 23.59 kg/m   Body mass index is 23.59 kg/m.  Advanced Directives 11/09/2017 11/08/2016  Does Patient Have a Medical Advance Directive? Yes No   [place]  Tobacco Social History   Tobacco Use  Smoking Status Former Smoker  . Packs/day: 1.00  . Years: 7.00  . Pack years: 7.00  . Last attempt to quit: 01/25/1971  . Years since quitting: 46.8  Smokeless Tobacco Never Used     Counseling given: Yes   Clinical Intake:     Past Medical History:  Diagnosis Date  . Alcohol use   . Anemia   . Anxiety   . Asthma   . Basal cell carcinoma    forehead  . [place]   [place]   . Colon polyps   . GERD (gastroesophageal reflux disease)   . Glaucoma   . Hyperlipidemia   . Hypertension   . Hyponatremia    remote, mild  . Mitral valve prolapse   . Osteoarthritis    sees rheumatologist  . Osteoporosis    sees rheumatologist  . Personal history of radiation therapy 1998  . PMR (polymyalgia rheumatica) ([facility])    sees rheumatologist  . Pneumonia   . [place]inzmetal angina ([facility]   . White matter changes    ? ischemic sm vs per pt   Past Surgical History:  Procedure Laterality Date  . ABDOMINAL HYSTERECTOMY    . BREAST LUMPECTOMY Left 1998   cancer  . CESAREAN SECTION     x 4  . COLONOSCOPY  07/2012   in [place]  . HERNIA REPAIR  2005   abdominal hernia repair  . HYSTEROTOMY    . LYMPH NODE DISSECTION Left   . MASTECTOMY, PARTIAL Left   . POLYPECTOMY     Family History  Problem Relation Age of Onset  . Coronary artery disease Mother   . Coronary artery disease Father   . Cancer - Ovarian Sister   . Colon cancer Cousin        x 2  . Esophageal cancer Neg Hx   . Rectal cancer Neg Hx   . Stomach  cancer Neg Hx    Social History   Socioeconomic History  . Marital status: Married    Spouse name: Not on file  . Number of children: 3  . Years of education: Not on file  . Highest education level: Not on file  Occupational History  . Occupation: Psychologist, occupational  Social Needs  . Financial resource strain: Not on file  . Food insecurity:    Worry: Not on file    Inability: Not on file  . Transportation needs:    Medical: Not on file    Non-medical: Not on file  Tobacco Use  . Smoking status: Former Smoker    Packs/day: 1.00    Years: 7.00    Pack years: 7.00    Last attempt to quit: 01/25/1971    Years since quitting: 46.8  . Smokeless tobacco: Never Used  Substance and Sexual Activity  . Alcohol use: Yes    Alcohol/week: 14.0 standard drinks    Types: 14 Standard drinks or equivalent per week    Comment: two scotches per day with water  . Drug use: No  . Sexual activity: Not on file  Lifestyle  . Physical activity:    Days per week: Not on file    Minutes per session: Not on  file  . Stress: Not on file  Relationships  . Social connections:    Talks on phone: Not on file    Gets together: Not on file    Attends religious service: Not on file    Active member of club or organization: Not on file    Attends meetings of clubs or organizations: Not on file    Relationship status: Not on file  Other Topics Concern  . 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 Alirocumab (PRALUENT) 75 MG/ML SOPN Inject 1 pen into the skin every 14 (fourteen) days.  Marland Kitchen amLODipine (NORVASC) 5 MG tablet Take 1 tablet (5 mg total) by mouth daily.  Marland Kitchen aspirin 81 MG tablet Take 81 mg by mouth daily.  . cetirizine (ZYRTEC) 10 MG tablet Take 10 mg by mouth 2 (two) times daily.   . Cyanocobalamin (B-12 PO) Take by mouth.  . denosumab (PROLIA) 60 MG/ML SOLN injection Inject 60 mg into the skin every 6 (six) months. Administer in upper arm, thigh, or abdomen  . esomeprazole (NEXIUM) 40 MG capsule TAKE ONE CAPSULE BY MOUTH EVERY DAY  . furosemide (LASIX) 20 MG tablet Take 0.5 tablets (10 mg total) by mouth 3 (three) times a week.  . IRON PO Take 1 tablet by mouth daily.  Marland Kitchen LORazepam (ATIVAN) 1 MG tablet Take 1 mg by mouth at bedtime as needed (spasms).  Marland Kitchen MAGNESIUM PO Take 400 mg by mouth daily.   . valsartan (DIOVAN) 80 MG tablet Take 1 tablet (80 mg total) by mouth daily.   No facility-administered encounter medications on file as of 11/09/2017.     Activities of Daily Living In your present state of health, do you have any difficulty performing the following activities: 11/09/2017  Hearing? N  Vision? N  Difficulty concentrating or making decisions? N  Walking or climbing stairs? N  Dressing or bathing? N  Doing errands,  shopping? N  Preparing Food and eating ? N  Using the Toilet? N  In the past six months, have you accidently leaked urine? N  Do you have problems with loss of bowel control? N  Comment Dumping syndrome  Managing your Medications? N  Managing your Finances? N  Housekeeping or managing your Housekeeping? N  Some recent data might be hidden    Patient Care Team: Lucretia Kern, DO as PCP - General (Family Medicine)    Assessment:   This is a routine wellness examination for Amanda Cannon.  Exercise Activities and Dietary recommendations Current Exercise Habits: Home exercise routine(may join the gym), Time (Minutes): 30, Frequency (Times/Week): 5, Weekly Exercise (Minutes/Week): 150, Intensity: Moderate  Goals    . Exercise 150 minutes per week (moderate activity)     Do more physical things Liked to do weights at the gym Bike and walks Loves stretching out     . Exercise 150 minutes per week (moderate activity)     Start to go to a yoga class       Fall Risk Fall Risk  11/09/2017 05/15/2017 11/08/2016  Falls in the past year? No No No  Comment states she has had balancre issues; working on tai chi - -     Depression Screen PHQ 2/9 Scores 11/09/2017 05/15/2017 11/08/2016  PHQ - 2 Score 0 2 0  PHQ- 9 Score - 9 -     Cognitive Function MMSE - Mini Mental State Exam 11/09/2017 11/08/2016  Not completed: (No Data) (No Data)     Ad8 score reviewed for issues:  Issues making decisions:  Less interest in hobbies / activities:  Repeats questions, stories (family complaining):  Trouble using ordinary gadgets (microwave, computer, phone):  Forgets the month or year:   Mismanaging finances:   Remembering appts:  Daily problems with thinking and/or memory: Ad8 score is=0       Immunization History  Administered Date(s) Administered  . Influenza, High Dose Seasonal PF 11/15/2016  . Influenza-Unspecified 10/25/2015  . Pneumococcal Conjugate-13 07/24/2013       Screening Tests Health Maintenance  Topic Date Due  . TETANUS/TDAP  10/28/1963  . INFLUENZA VACCINE  03/17/2018 (Originally 08/24/2017)  . Hepatitis C Screening  08/12/2026 (Originally 1945/12/21)  . MAMMOGRAM  07/26/2019  . COLONOSCOPY  09/28/2022  . DEXA SCAN  Completed  . PNA vac Low Risk Adult  Completed          Plan:      PCP Notes   Health Maintenance To take high dose flu vaccine today   Colonoscopy 09/2017 Mammogram 07/2017 Bone density 12/2015 -2.4 much improved on prolia   Pneumonia vaccines are noted as completed   Abnormal Screens  Still c/o of left sided (temporal numbness) to fup with neuro  Referrals  none   Patient concerns; nexium generic has sodium in it and help her sodium levels Concerned about nodules in lower abd. Dr. Maudie Mercury to evaluate   Nurse Concerns; As noted   Next PCP apt Today    I have personally reviewed and noted the following in the patient's chart:   . Medical and social history . Use of alcohol, tobacco or illicit drugs  . Current medications and supplements . Functional ability and status . Nutritional status . Physical activity . Advanced directives . List of other physicians . Hospitalizations, surgeries, and ER visits in previous 12 months . Vitals . Screenings to include cognitive, depression, and falls . Referrals and  appointments  In addition, I have reviewed and discussed with patient certain preventive protocols, quality metrics, and best practice recommendations. A written personalized care plan for preventive services as well as general preventive health recommendations were provided to patient.     Wynetta Fines, RN  11/09/2017

## 2017-11-09 ENCOUNTER — Ambulatory Visit: Payer: Medicare Other

## 2017-11-09 ENCOUNTER — Encounter: Payer: Self-pay | Admitting: Family Medicine

## 2017-11-09 ENCOUNTER — Ambulatory Visit (INDEPENDENT_AMBULATORY_CARE_PROVIDER_SITE_OTHER): Payer: Medicare Other | Admitting: Family Medicine

## 2017-11-09 VITALS — BP 110/70 | HR 79 | Temp 98.4°F | Ht <= 58 in | Wt 107.0 lb

## 2017-11-09 DIAGNOSIS — E7849 Other hyperlipidemia: Secondary | ICD-10-CM

## 2017-11-09 DIAGNOSIS — Z0000 Encounter for general adult medical examination without abnormal findings: Secondary | ICD-10-CM

## 2017-11-09 DIAGNOSIS — M353 Polymyalgia rheumatica: Secondary | ICD-10-CM

## 2017-11-09 DIAGNOSIS — R222 Localized swelling, mass and lump, trunk: Secondary | ICD-10-CM | POA: Diagnosis not present

## 2017-11-09 DIAGNOSIS — Z1159 Encounter for screening for other viral diseases: Secondary | ICD-10-CM | POA: Diagnosis not present

## 2017-11-09 DIAGNOSIS — I10 Essential (primary) hypertension: Secondary | ICD-10-CM | POA: Diagnosis not present

## 2017-11-09 DIAGNOSIS — E871 Hypo-osmolality and hyponatremia: Secondary | ICD-10-CM

## 2017-11-09 DIAGNOSIS — Z23 Encounter for immunization: Secondary | ICD-10-CM

## 2017-11-09 DIAGNOSIS — M810 Age-related osteoporosis without current pathological fracture: Secondary | ICD-10-CM

## 2017-11-09 DIAGNOSIS — I201 Angina pectoris with documented spasm: Secondary | ICD-10-CM | POA: Diagnosis not present

## 2017-11-09 NOTE — Patient Instructions (Addendum)
BEFORE YOU LEAVE: -lab appointment -follow up: 4-6 months  Can you send [place] the BMP results?  Consider tetanus booster if not done elsewhere.  I am glad you will be seeing Dr. Jaynee Eagles.   We recommend the following healthy lifestyle for LIFE: 1) Small portions. But, make sure to get regular (at least 3 per day), healthy meals and small healthy snacks if needed.  2) Eat a healthy clean diet.   TRY TO EAT: -at least 5-7 servings of low sugar, colorful, and nutrient rich vegetables per day (not corn, potatoes or bananas.) -berries are the best choice if you wish to eat fruit (only eat small amounts if trying to reduce weight)  -lean meets (fish, white meat of chicken or [place]) -vegan proteins for some meals - beans or tofu, whole grains, nuts and seeds -Replace bad fats with good fats - good fats include: fish, nuts and seeds, canola oil, olive oil -small amounts of low fat or non fat dairy -small amounts of100 % whole grains - check the lables -drink plenty of water  AVOID: -SUGAR, sweets, anything with added sugar, corn syrup or sweeteners - must read labels as even foods advertised as "healthy" often are loaded with sugar -if you must have a sweetener, small amounts of stevia may be best -sweetened beverages and artificially sweetened beverages -simple starches (rice, bread, potatoes, pasta, chips, etc - small amounts of 100% whole grains are ok) -red meat, pork, butter -fried foods, fast food, processed food, excessive dairy, eggs and coconut.  3)Get at least 150 minutes of sweaty aerobic exercise per week.  4)Reduce stress - consider counseling, meditation and relaxation to balance other aspects of your life.

## 2017-11-09 NOTE — Patient Instructions (Addendum)
Amanda Cannon , Thank you for taking time to come for your Medicare Wellness Visit. I appreciate your ongoing commitment to your health goals. Please review the following plan we discussed and let me know if I can assist you in the future.   High dose flu vaccine today  Shingrix is a vaccine for the prevention of Shingles in Adults 50 and older.  If you are on Medicare, the shingrix is covered under your Part D plan, so you will take both of the vaccines in the series at your pharmacy. Please check with your benefits regarding applicable copays or out of pocket expenses.  The Shingrix is given in 2 vaccines approx 8 weeks apart. You must receive the 2nd dose prior to 6 months from receipt of the first. Please have the pharmacist print out you Immunization  dates for our office records    These are the goals we discussed: Goals    . Exercise 150 minutes per week (moderate activity)     Do more physical things Liked to do weights at the gym Bike and walks Loves stretching out     . Exercise 150 minutes per week (moderate activity)     Start to go to a yoga class       This is a list of the screening recommended for you and due dates:  Health Maintenance  Topic Date Due  . Tetanus Vaccine  10/28/1964  . Flu Shot  03/17/2018*  .  Hepatitis C: One time screening is recommended by Center for Disease Control  (CDC) for  adults born from [birth year] through 1965.   08/12/2026*  . Mammogram  07/26/2019  . Colon Cancer Screening  09/28/2022  . DEXA scan (bone density measurement)  Completed  . Pneumonia vaccines  Completed  *Topic was postponed. The date shown is not the original due date.    Community Occupational psychologist of Services Cost  A Matter of Balance Class locations vary. Call [place] on Aging for more information.  [email or web address]-[email or web address]/ [phone] 8-Session program addressing the fear of falling and increasing activity  levels of older adults Free to minimal cost  A.C.T. By [employer] [address], [place], [place] [address].  BetaBlues.dk [phone]  Personal training, gym, classes including Silver Sneakers* and ACTion for Aging Adults Fee-based  A.H.O.Y. (Add Health to [place]) Airs on Time [employer] 13, M-F at [place]: [employer],  [place] [facility] [place],  [place], [place] [facility], [address] [facility], [place], [place], [place] [address]  [place] Location: Sharrell Ku. [employer] [place]      [phone]  [phone]  [phone]  [phone]  [phone]  [phone]  [phone]  [phone]  [phone]  [phone]    [phone] A total-body conditioning class for adults 36 and older; designed to increase muscular strength, endurance, range of movement, flexibility, balance, agility and coordination Free  [facility] [place], [place] [address] [place]      [address]      [phone]  Pilate's class for individualsreturning to exercise after an injury, before or after surgery or for individuals with complex musculoskeletal issues; designed to improve strength, balance , flexibility      $15/class  [place] [address] [place], [place] [address] www.[email or web address] [place] classes for beginners to advanced [place], [place]  [address] [email or web address] [place] Chair Exercises Free, ages 62 and older; Ages 46-59 fee based  Marvia Pickles, [facility] [address] [place], [place] [address] [email or web address] .gov [phone]  A.H.O.Y. Tai Chi Fee-based Donation based or free  Warrior Class locations vary.  Call or email Angela Burke or view website for more information. [email or web address] .[email or web address] GainPain.com.[email or web address] [phone] Ongoing classes at local [employer] and gyms Fee-based  Silver Sneakers A.C.T. By [place] [employer]: [place] [phone]  [phone] Classes designed for older adults who want to improve their strength, flexibility, balance and endurance.   Silver sneakers is covered by some insurance plans and includes a fitness center membership at participating locations. Find out more by calling [phone] or visiting www.[email or web address] Covered by some insurance plans  [facility] [place] [phone] A.H.O.Y., fitness room, personal training, fitness classes for injury prevention, strength, balance, flexibility, water fitness classes Ages 55+: $52 for 6 months; Ages 56-54: $80 for 6 months  Tai Chi for Everybody [facility] [address] [place], [place] [address] [email or web address] .[email or web address] Sears [phone] Tai Chi classes for beginners to advanced; geared for seniors Donation Based      [employer] ([place] Others Participate in Exercise     [place]ler. Rosana Hoes, PhD, [place]  [email or web address] [place]     [phone]     A comprehensive fitness program for adults.  The program paris senior-level undergraduates Kinesiology students with adults who desire to learn how to exercise safely.  Includes a structural exercise class focusing on functional fitnesss     $100/semester in fall and spring; $75 in summer (no trainers)    *Silver Sneakers is covered by some Personal assistant and includes a  Radio producer at participating locations.  Find out more by calling [phone] or visiting [email or web address]  For additional health and human services resources for senior adults, please contact SeniorLine at [phone] in [place] and [place] at [phone] in all other areas.  Fall Prevention in the Home Falls can cause injuries. They can happen to people of all ages. There are many things you can do to make your home safe and to help prevent falls. What can I do on the outside of my home?  Regularly fix the edges of walkways and driveways and fix any cracks.  Remove anything that might make you trip as you walk through a door, such as a raised step or threshold.  Trim any bushes or trees on the path to your home.  Use bright outdoor lighting.  Clear any walking paths of anything that might make someone trip, such as rocks or tools.  Regularly check to see if handrails are loose or broken. Make sure that both sides of any steps have handrails.  Any raised decks  and porches should have guardrails on the edges.  Have any leaves, snow, or ice cleared regularly.  Use sand or salt on walking paths during winter.  Clean up any spills in your garage right away. This includes oil or grease spills. What can I do in the bathroom?  Use night lights.  Install grab bars by the toilet and in the tub and shower. Do not use towel bars as grab bars.  Use non-skid mats or decals in the tub or shower.  If you need to sit down in the shower, use a  plastic, non-slip stool.  Keep the floor dry. Clean up any water that spills on the floor as soon as it happens.  Remove soap buildup in the tub or shower regularly.  Attach bath mats securely with double-sided non-slip rug tape.  Do not have throw rugs and other things on the floor that can make you trip. What can I do in the bedroom?  Use night lights.  Make sure that you have a light by your bed that is easy to reach.  Do not use any sheets or blankets that are too big for your bed. They should not hang down onto the floor.  Have a firm chair that has side arms. You can use this for support while you get dressed.  Do not have throw rugs and other things on the floor that can make you trip. What can I do in the kitchen?  Clean up any spills right away.  Avoid walking on wet floors.  Keep items that you use a lot in easy-to-reach places.  If you need to reach something above you, use a strong step stool that has a grab bar.  Keep electrical cords out of the way.  Do not use floor polish or wax that makes floors slippery. If you must use wax, use non-skid floor wax.  Do not have throw rugs and other things on the floor that can make you trip. What can I do with my stairs?  Do not leave any items on the stairs.  Make sure that there are handrails on both sides of the stairs and use them. Fix handrails that are broken or loose. Make sure that handrails are as long as the stairways.  Check any carpeting to make sure that it is firmly attached to the stairs. Fix any carpet that is loose or worn.  Avoid having throw rugs at the top or bottom of the stairs. If you do have throw rugs, attach them to the floor with carpet tape.  Make sure that you have a light switch at the top of the stairs and the bottom of the stairs. If you do not have them, ask someone to add them for you. What else can I do to help prevent falls?  Wear shoes that: ? Do not have high heels. ? Have  rubber bottoms. ? Are comfortable and fit you well. ? Are closed at the toe. Do not wear sandals.  If you use a stepladder: ? Make sure that it is fully opened. Do not climb a closed stepladder. ? Make sure that both sides of the stepladder are locked into place. ? Ask someone to hold it for you, if possible.  Clearly mark and make sure that you can see: ? Any grab bars or handrails. ? First and last steps. ? Where the edge of each step is.  Use tools that help you move around (mobility aids) if they are needed.  These include: ? Canes. ? Walkers. ? Scooters. ? Crutches.  Turn on the lights when you go into a dark area. Replace any light bulbs as soon as they burn out.  Set up your furniture so you have a clear path. Avoid moving your furniture around.  If any of your floors are uneven, fix them.  If there are any pets around you, be aware of where they are.  Review your medicines with your doctor. Some medicines can make you feel dizzy. This can increase your chance of falling. Ask your doctor what other things that you can do to help prevent falls. This information is not intended to replace advice given to you by your health care provider. Make sure you discuss any questions you have with your health care provider. Document Released: 11/06/2008 Document Revised: 06/18/2015 Document Reviewed: 02/14/2014 Elsevier Interactive Patient Education  2018 [place] Maintenance, Female Adopting a healthy lifestyle and getting preventive care can go a long way to promote health and wellness. Talk with your health care provider about what schedule of regular examinations is right for you. This is a good chance for you to check in with your provider about disease prevention and staying healthy. In between checkups, there are plenty of things you can do on your own. Experts have done a lot of research about which lifestyle changes and preventive measures are most likely to keep  you healthy. Ask your health care provider for more information. Weight and diet Eat a healthy diet  Be sure to include plenty of vegetables, fruits, low-fat dairy products, and lean protein.  Do not eat a lot of foods high in solid fats, added sugars, or salt.  Get regular exercise. This is one of the most important things you can do for your health. ? Most adults should exercise for at least 150 minutes each week. The exercise should increase your heart rate and make you sweat (moderate-intensity exercise). ? Most adults should also do strengthening exercises at least twice a week. This is in addition to the moderate-intensity exercise.  Maintain a healthy weight  Body mass index (BMI) is a measurement that can be used to identify possible weight problems. It estimates body fat based on height and weight. Your health care provider can help determine your BMI and help you achieve or maintain a healthy weight.  For females 31 years of age and older: ? A BMI below 18.5 is considered underweight. ? A BMI of 18.5 to 24.9 is normal. ? A BMI of 25 to 29.9 is considered overweight. ? A BMI of 30 and above is considered obese.  Watch levels of cholesterol and blood lipids  You should start having your blood tested for lipids and cholesterol at 73 years of age, then have this test every 5 years.  You may need to have your cholesterol levels checked more often if: ? Your lipid or cholesterol levels are high. ? You are older than 73 years of age. ? You are at high risk for heart disease.  Cancer screening Lung Cancer  Lung cancer screening is recommended for adults 79-41 years old who are at high risk for lung cancer because of a history of smoking.  A yearly low-dose CT scan of the lungs is recommended for people who: ? Currently smoke. ? Have quit within the past 15 years. ? Have at least a 30-pack-year history of smoking. A pack year is smoking an average of one pack of cigarettes a  day for 1 year.  Yearly screening should continue until it has been 15 years since you quit.  Yearly screening should stop if you develop a health problem that would prevent you from having lung cancer treatment.  Breast Cancer  Practice breast self-awareness. This means understanding how your breasts normally appear and feel.  It also means doing regular breast self-exams. Let your health care provider know about any changes, no matter how small.  If you are in your 20s or 30s, you should have a clinical breast exam (CBE) by a health care provider every 1-3 years as part of a regular health exam.  If you are 66 or older, have a CBE every year. Also consider having a breast X-ray (mammogram) every year.  If you have a family history of breast cancer, talk to your health care provider about genetic screening.  If you are at high risk for breast cancer, talk to your health care provider about having an MRI and a mammogram every year.  Breast cancer gene (BRCA) assessment is recommended for women who have family members with BRCA-related cancers. BRCA-related cancers include: ? Breast. ? Ovarian. ? Tubal. ? Peritoneal cancers.  Results of the assessment will determine the need for genetic counseling and BRCA1 and BRCA2 testing.  Cervical Cancer Your health care provider may recommend that you be screened regularly for cancer of the pelvic organs (ovaries, uterus, and vagina). This screening involves a pelvic examination, including checking for microscopic changes to the surface of your cervix (Pap test). You may be encouraged to have this screening done every 3 years, beginning at age 12.  For women ages 42-65, health care providers may recommend pelvic exams and Pap testing every 3 years, or they may recommend the Pap and pelvic exam, combined with testing for human papilloma virus (HPV), every 5 years. Some types of HPV increase your risk of cervical cancer. Testing for HPV may also be  done on women of any age with unclear Pap test results.  Other health care providers may not recommend any screening for nonpregnant women who are considered low risk for pelvic cancer and who do not have symptoms. Ask your health care provider if a screening pelvic exam is right for you.  If you have had past treatment for cervical cancer or a condition that could lead to cancer, you need Pap tests and screening for cancer for at least 20 years after your treatment. If Pap tests have been discontinued, your risk factors (such as having a new sexual partner) need to be reassessed to determine if screening should resume. Some women have medical problems that increase the chance of getting cervical cancer. In these cases, your health care provider may recommend more frequent screening and Pap tests.  Colorectal Cancer  This type of cancer can be detected and often prevented.  Routine colorectal cancer screening usually begins at 73 years of age and continues through 73 years of age.  Your health care provider may recommend screening at an earlier age if you have risk factors for colon cancer.  Your health care provider may also recommend using home test kits to check for hidden blood in the stool.  A small camera at the end of a tube can be used to examine your colon directly (sigmoidoscopy or colonoscopy). This is done to check for the earliest forms of colorectal cancer.  Routine screening usually begins at age 76.  Direct examination of the colon should be repeated every 5-10 years through  73 years of age. However, you may need to be screened more often if early forms of precancerous polyps or small growths are found.  Skin Cancer  Check your skin from head to toe regularly.  Tell your health care provider about any new moles or changes in moles, especially if there is a change in a mole's shape or color.  Also tell your health care provider if you have a mole that is larger than the  size of a pencil eraser.  Always use sunscreen. Apply sunscreen liberally and repeatedly throughout the day.  Protect yourself by wearing long sleeves, pants, a wide-brimmed hat, and sunglasses whenever you are outside.  Heart disease, diabetes, and high blood pressure  High blood pressure causes heart disease and increases the risk of stroke. High blood pressure is more likely to develop in: ? People who have blood pressure in the high end of the normal range (130-139/85-89 mm Hg). ? People who are overweight or obese. ? People who are African American.  If you are 73-87 years of age, have your blood pressure checked every 3-5 years. If you are 25 years of age or older, have your blood pressure checked every year. You should have your blood pressure measured twice-once when you are at a hospital or clinic, and once when you are not at a hospital or clinic. Record the average of the two measurements. To check your blood pressure when you are not at a hospital or clinic, you can use: ? An automated blood pressure machine at a pharmacy. ? A home blood pressure monitor.  If you are between 64 years and 1 years old, ask your health care provider if you should take aspirin to prevent strokes.  Have regular diabetes screenings. This involves taking a blood sample to check your fasting blood sugar level. ? If you are at a normal weight and have a low risk for diabetes, have this test once every three years after 73 years of age. ? If you are overweight and have a high risk for diabetes, consider being tested at a younger age or more often. Preventing infection Hepatitis B  If you have a higher risk for hepatitis B, you should be screened for this virus. You are considered at high risk for hepatitis B if: ? You were born in a country where hepatitis B is common. Ask your health care provider which countries are considered high risk. ? Your parents were born in a high-risk country, and you have  not been immunized against hepatitis B (hepatitis B vaccine). ? You have HIV or AIDS. ? You use needles to inject street drugs. ? You live with someone who has hepatitis B. ? You have had sex with someone who has hepatitis B. ? You get hemodialysis treatment. ? You take certain medicines for conditions, including cancer, organ transplantation, and autoimmune conditions.  Hepatitis C  Blood testing is recommended for: ? Everyone born from [birth year] through 1966. ? Anyone with known risk factors for hepatitis C.  Sexually transmitted infections (STIs)  You should be screened for sexually transmitted infections (STIs) including gonorrhea and chlamydia if: ? You are sexually active and are younger than 73 years of age. ? You are older than 73 years of age and your health care provider tells you that you are at risk for this type of infection. ? Your sexual activity has changed since you were last screened and you are at an increased risk for chlamydia or gonorrhea. Ask your  health care provider if you are at risk.  If you do not have HIV, but are at risk, it may be recommended that you take a prescription medicine daily to prevent HIV infection. This is called pre-exposure prophylaxis (PrEP). You are considered at risk if: ? You are sexually active and do not regularly use condoms or know the HIV status of your partner(s). ? You take drugs by injection. ? You are sexually active with a partner who has HIV.  Talk with your health care provider about whether you are at high risk of being infected with HIV. If you choose to begin PrEP, you should first be tested for HIV. You should then be tested every 3 months for as long as you are taking PrEP. Pregnancy  If you are premenopausal and you may become pregnant, ask your health care provider about preconception counseling.  If you may become pregnant, take 400 to 800 micrograms (mcg) of folic acid every day.  If you want to prevent pregnancy, talk  to your health care provider about birth control (contraception). Osteoporosis and menopause  Osteoporosis is a disease in which the bones lose minerals and strength with aging. This can result in serious bone fractures. Your risk for osteoporosis can be identified using a bone density scan.  If you are 33 years of age or older, or if you are at risk for osteoporosis and fractures, ask your health care provider if you should be screened.  Ask your health care provider whether you should take a calcium or vitamin D supplement to lower your risk for osteoporosis.  Menopause may have certain physical symptoms and risks.  Hormone replacement therapy may reduce some of these symptoms and risks. Talk to your health care provider about whether hormone replacement therapy is right for you. Follow these instructions at home:  Schedule regular health, dental, and eye exams.  Stay current with your immunizations.  Do not use any tobacco products including cigarettes, chewing tobacco, or electronic cigarettes.  If you are pregnant, do not drink alcohol.  If you are breastfeeding, limit how much and how often you drink alcohol.  Limit alcohol intake to no more than 1 drink per day for nonpregnant women. One drink equals 12 ounces of beer, 5 ounces of wine, or 1 ounces of hard liquor.  Do not use street drugs.  Do not share needles.  Ask your health care provider for help if you need support or information about quitting drugs.  Tell your health care provider if you often feel depressed.  Tell your health care provider if you have ever been abused or do not feel safe at home. This information is not intended to replace advice given to you by your health care provider. Make sure you discuss any questions you have with your health care provider. Document Released: 07/26/2010 Document Revised: 06/18/2015 Document Reviewed: 10/14/2014 Elsevier Interactive Patient Education  2018 [employer].   Hearing Loss Hearing loss is a partial or total loss of the ability to hear. This can be temporary or permanent, and it can happen in one or both ears. Hearing loss may be referred to as deafness. Medical care is necessary to treat hearing loss properly and to prevent the condition from getting worse. Your hearing may partially or completely come back, depending on what caused your hearing loss and how severe it is. In some cases, hearing loss is permanent. What are the causes? Common causes of hearing loss include:  Too much wax  in the ear canal.  Infection of the ear canal or middle ear.  Fluid in the middle ear.  Injury to the ear or surrounding area.  An object stuck in the ear.  Prolonged exposure to loud sounds, such as music.  Less common causes of hearing loss include:  Tumors in the ear.  Viral or bacterial infections, such as meningitis.  A hole in the eardrum (perforated eardrum).  Problems with the hearing nerve that sends signals between the brain and the ear.  Certain medicines.  What are the signs or symptoms? Symptoms of this condition may include:  Difficulty telling the difference between sounds.  Difficulty following a conversation when there is background noise.  Lack of response to sounds in your environment. This may be most noticeable when you do not respond to startling sounds.  Needing to turn up the volume on the television, radio, etc.  Ringing in the ears.  Dizziness.  Pain in the ears.  How is this diagnosed? This condition is diagnosed based on a physical exam and a hearing test (audiometry). The audiometry test will be performed by a hearing specialist (audiologist). You may also be referred to an ear, nose, and throat (ENT) specialist (otolaryngologist). How is this treated? Treatment for recent onset of hearing loss may include:  Ear wax removal.  Being prescribed medicines to prevent infection (antibiotics).  Being  prescribed medicines to reduce inflammation (corticosteroids).  Follow these instructions at home:  If you were prescribed an antibiotic medicine, take it as told by your health care provider. Do not stop taking the antibiotic even if you start to feel better.  Take over-the-counter and prescription medicines only as told by your health care provider.  Avoid loud noises.  Return to your normal activities as told by your health care provider. Ask your health care provider what activities are safe for you.  Keep all follow-up visits as told by your health care provider. This is important. Contact a health care provider if:  You feel dizzy.  You develop new symptoms.  You vomit or feel nauseous.  You have a fever. Get help right away if:  You develop sudden changes in your vision.  You have severe ear pain.  You have new or increased weakness.  You have a severe headache. This information is not intended to replace advice given to you by your health care provider. Make sure you discuss any questions you have with your health care provider. Document Released: 01/10/2005 Document Revised: 06/18/2015 Document Reviewed: 05/28/2014 Elsevier Interactive Patient Education  2018 [employer].

## 2017-11-10 NOTE — Progress Notes (Signed)
Hannah R Kim, DO  

## 2017-11-13 ENCOUNTER — Encounter: Payer: Self-pay | Admitting: Neurology

## 2017-11-13 ENCOUNTER — Ambulatory Visit (INDEPENDENT_AMBULATORY_CARE_PROVIDER_SITE_OTHER): Payer: Medicare Other | Admitting: Neurology

## 2017-11-13 ENCOUNTER — Telehealth: Payer: Self-pay | Admitting: Neurology

## 2017-11-13 VITALS — BP 133/80 | HR 98 | Ht <= 58 in | Wt 106.0 lb

## 2017-11-13 DIAGNOSIS — G509 Disorder of trigeminal nerve, unspecified: Secondary | ICD-10-CM

## 2017-11-13 DIAGNOSIS — I201 Angina pectoris with documented spasm: Secondary | ICD-10-CM

## 2017-11-13 DIAGNOSIS — R202 Paresthesia of skin: Secondary | ICD-10-CM

## 2017-11-13 DIAGNOSIS — H9192 Unspecified hearing loss, left ear: Secondary | ICD-10-CM | POA: Diagnosis not present

## 2017-11-13 DIAGNOSIS — R270 Ataxia, unspecified: Secondary | ICD-10-CM

## 2017-11-13 DIAGNOSIS — G3281 Cerebellar ataxia in diseases classified elsewhere: Secondary | ICD-10-CM

## 2017-11-13 DIAGNOSIS — H5712 Ocular pain, left eye: Secondary | ICD-10-CM | POA: Diagnosis not present

## 2017-11-13 DIAGNOSIS — R200 Anesthesia of skin: Secondary | ICD-10-CM | POA: Diagnosis not present

## 2017-11-13 DIAGNOSIS — G500 Trigeminal neuralgia: Secondary | ICD-10-CM | POA: Diagnosis not present

## 2017-11-13 DIAGNOSIS — R9082 White matter disease, unspecified: Secondary | ICD-10-CM

## 2017-11-13 DIAGNOSIS — R519 Headache, unspecified: Secondary | ICD-10-CM

## 2017-11-13 DIAGNOSIS — R51 Headache: Secondary | ICD-10-CM

## 2017-11-13 DIAGNOSIS — W19XXXA Unspecified fall, initial encounter: Secondary | ICD-10-CM

## 2017-11-13 HISTORY — DX: Disorder of trigeminal nerve, unspecified: G50.9

## 2017-11-13 NOTE — Telephone Encounter (Signed)
I just saw her a few hours ago, have not completed it yet

## 2017-11-13 NOTE — Telephone Encounter (Signed)
Pt has asked that the MRI coordinator be made aware she will be going out of town on 11-05 and not returning until the end of December.  If possible pt is asking to be scheduled before 5th of November if possible

## 2017-11-13 NOTE — Patient Instructions (Addendum)
MRI of the brain with a special attention to the left trigeminal nerve May consider Lyrica or Topiramate as treatment  Topiramate tablets What is this medicine? TOPIRAMATE (toe PYRE a mate) is used to treat seizures in adults or children with epilepsy. It is also used for the prevention of migraine headaches. This medicine may be used for other purposes; ask your health care provider or pharmacist if you have questions. COMMON BRAND NAME(S): Topamax, Topiragen What should I tell my health care provider before I take this medicine? They need to know if you have any of these conditions: -bleeding disorders -cirrhosis of the liver or liver disease -diarrhea -glaucoma -kidney stones or kidney disease -low blood counts, like low white cell, platelet, or red cell counts -lung disease like asthma, obstructive pulmonary disease, emphysema -metabolic acidosis -on a ketogenic diet -schedule for surgery or a procedure -suicidal thoughts, plans, or attempt; a previous suicide attempt by you or a family member -an unusual or allergic reaction to topiramate, other medicines, foods, dyes, or preservatives -pregnant or trying to get pregnant -breast-feeding How should I use this medicine? Take this medicine by mouth with a glass of water. Follow the directions on the prescription label. Do not crush or chew. You may take this medicine with meals. Take your medicine at regular intervals. Do not take it more often than directed. Talk to your pediatrician regarding the use of this medicine in children. Special care may be needed. While this drug may be prescribed for children as young as 73 years of age for selected conditions, precautions do apply. Overdosage: If you think you have taken too much of this medicine contact a poison control center or emergency room at once. NOTE: This medicine is only for you. Do not share this medicine with others. What if I miss a dose? If you miss a dose, take it as soon  as you can. If your next dose is to be taken in less than 6 hours, then do not take the missed dose. Take the next dose at your regular time. Do not take double or extra doses. What may interact with this medicine? Do not take this medicine with any of the following medications: -probenecid This medicine may also interact with the following medications: -acetazolamide -alcohol -amitriptyline -aspirin and aspirin-like medicines -birth control pills -certain medicines for depression -certain medicines for seizures -certain medicines that treat or prevent blood clots like warfarin, enoxaparin, dalteparin, apixaban, dabigatran, and rivaroxaban -digoxin -hydrochlorothiazide -lithium -medicines for pain, sleep, or muscle relaxation -metformin -methazolamide -NSAIDS, medicines for pain and inflammation, like ibuprofen or naproxen -pioglitazone -risperidone This list may not describe all possible interactions. Give your health care provider a list of all the medicines, herbs, non-prescription drugs, or dietary supplements you use. Also tell them if you smoke, drink alcohol, or use illegal drugs. Some items may interact with your medicine. What should I watch for while using this medicine? Visit your doctor or health care professional for regular checks on your progress. Do not stop taking this medicine suddenly. This increases the risk of seizures if you are using this medicine to control epilepsy. Wear a medical identification bracelet or chain to say you have epilepsy or seizures, and carry a card that lists all your medicines. This medicine can decrease sweating and increase your body temperature. Watch for signs of deceased sweating or fever, especially in children. Avoid extreme heat, hot baths, and saunas. Be careful about exercising, especially in hot weather. Contact your health care provider  right away if you notice a fever or decrease in sweating. You should drink plenty of fluids while  taking this medicine. If you have had kidney stones in the past, this will help to reduce your chances of forming kidney stones. If you have stomach pain, with nausea or vomiting and yellowing of your eyes or skin, call your doctor immediately. You may get drowsy, dizzy, or have blurred vision. Do not drive, use machinery, or do anything that needs mental alertness until you know how this medicine affects you. To reduce dizziness, do not sit or stand up quickly, especially if you are an older patient. Alcohol can increase drowsiness and dizziness. Avoid alcoholic drinks. If you notice blurred vision, eye pain, or other eye problems, seek medical attention at once for an eye exam. The use of this medicine may increase the chance of suicidal thoughts or actions. Pay special attention to how you are responding while on this medicine. Any worsening of mood, or thoughts of suicide or dying should be reported to your health care professional right away. This medicine may increase the chance of developing metabolic acidosis. If left untreated, this can cause kidney stones, bone disease, or slowed growth in children. Symptoms include breathing fast, fatigue, loss of appetite, irregular heartbeat, or loss of consciousness. Call your doctor immediately if you experience any of these side effects. Also, tell your doctor about any surgery you plan on having while taking this medicine since this may increase your risk for metabolic acidosis. Birth control pills may not work properly while you are taking this medicine. Talk to your doctor about using an extra method of birth control. Women who become pregnant while using this medicine may enroll in the [place] Pregnancy Registry by calling [phone]. This registry collects information about the safety of antiepileptic drug use during pregnancy. What side effects may I notice from receiving this medicine? Side effects that you should report  to your doctor or health care professional as soon as possible: -allergic reactions like skin rash, itching or hives, swelling of the face, lips, or tongue -decreased sweating and/or rise in body temperature -depression -difficulty breathing, fast or irregular breathing patterns -difficulty speaking -difficulty walking or controlling muscle movements -hearing impairment -redness, blistering, peeling or loosening of the skin, including inside the mouth -tingling, pain or numbness in the hands or feet -unusual bleeding or bruising -unusually weak or tired -worsening of mood, thoughts or actions of suicide or dying Side effects that usually do not require medical attention (report to your doctor or health care professional if they continue or are bothersome): -altered taste -back pain, joint or muscle aches and pains -diarrhea, or constipation -headache -loss of appetite -nausea -stomach upset, indigestion -tremors This list may not describe all possible side effects. Call your doctor for medical advice about side effects. You may report side effects to FDA at 1-800-FDA-1088. Where should I keep my medicine? Keep out of the reach of children. Store at room temperature between 15 and 30 degrees C (59 and 86 degrees F) in a tightly closed container. Protect from moisture. Throw away any unused medicine after the expiration date. NOTE: This sheet is a summary. It may not cover all possible information. If you have questions about this medicine, talk to your doctor, pharmacist, or health care provider.  2018 Elsevier/Gold Standard (2013-01-14 23:17:57)   Pregabalin capsules What is this medicine? PREGABALIN (pre GAB a lin) is used to treat nerve pain from diabetes, shingles, spinal cord  injury, and fibromyalgia. It is also used to control seizures in epilepsy. This medicine may be used for other purposes; ask your health care provider or pharmacist if you have questions. COMMON BRAND  NAME(S): Lyrica What should I tell my health care provider before I take this medicine? They need to know if you have any of these conditions: -bleeding problems -heart disease, including heart failure -history of alcohol or drug abuse -kidney disease -suicidal thoughts, plans, or attempt; a previous suicide attempt by you or a family member -an unusual or allergic reaction to pregabalin, gabapentin, other medicines, foods, dyes, or preservatives -pregnant or trying to get pregnant or trying to conceive with your partner -breast-feeding How should I use this medicine? Take this medicine by mouth with a glass of water. Follow the directions on the prescription label. You can take this medicine with or without food. Take your doses at regular intervals. Do not take your medicine more often than directed. Do not stop taking except on your doctor's advice. A special MedGuide will be given to you by the pharmacist with each prescription and refill. Be sure to read this information carefully each time. Talk to your pediatrician regarding the use of this medicine in children. Special care may be needed. Overdosage: If you think you have taken too much of this medicine contact a poison control center or emergency room at once. NOTE: This medicine is only for you. Do not share this medicine with others. What if I miss a dose? If you miss a dose, take it as soon as you can. If it is almost time for your next dose, take only that dose. Do not take double or extra doses. What may interact with this medicine? -alcohol -certain medicines for blood pressure like captopril, enalapril, or lisinopril -certain medicines for diabetes, like pioglitazone or rosiglitazone -certain medicines for anxiety or sleep -narcotic medicines for pain This list may not describe all possible interactions. Give your health care provider a list of all the medicines, herbs, non-prescription drugs, or dietary supplements you use.  Also tell them if you smoke, drink alcohol, or use illegal drugs. Some items may interact with your medicine. What should I watch for while using this medicine? Tell your doctor or healthcare professional if your symptoms do not start to get better or if they get worse. Visit your doctor or health care professional for regular checks on your progress. Do not stop taking except on your doctor's advice. You may develop a severe reaction. Your doctor will tell you how much medicine to take. Wear a medical identification bracelet or chain if you are taking this medicine for seizures, and carry a card that describes your disease and details of your medicine and dosage times. You may get drowsy or dizzy. Do not drive, use machinery, or do anything that needs mental alertness until you know how this medicine affects you. Do not stand or sit up quickly, especially if you are an older patient. This reduces the risk of dizzy or fainting spells. Alcohol may interfere with the effect of this medicine. Avoid alcoholic drinks. If you have a heart condition, like congestive heart failure, and notice that you are retaining water and have swelling in your hands or feet, contact your health care provider immediately. The use of this medicine may increase the chance of suicidal thoughts or actions. Pay special attention to how you are responding while on this medicine. Any worsening of mood, or thoughts of suicide or dying should be  reported to your health care professional right away. This medicine has caused reduced sperm counts in some men. This may interfere with the ability to father a child. You should talk to your doctor or health care professional if you are concerned about your fertility. Women who become pregnant while using this medicine for seizures may enroll in the [place] Pregnancy Registry by calling [phone]. This registry collects information about the safety of antiepileptic  drug use during pregnancy. What side effects may I notice from receiving this medicine? Side effects that you should report to your doctor or health care professional as soon as possible: -allergic reactions like skin rash, itching or hives, swelling of the face, lips, or tongue -breathing problems -changes in vision -chest pain -confusion -jerking or unusual movements of any part of your body -loss of memory -muscle pain, tenderness, or weakness -suicidal thoughts or other mood changes -swelling of the ankles, feet, hands -unusual bruising or bleeding Side effects that usually do not require medical attention (report to your doctor or health care professional if they continue or are bothersome): -dizziness -drowsiness -dry mouth -headache -nausea -tremors -trouble sleeping -weight gain This list may not describe all possible side effects. Call your doctor for medical advice about side effects. You may report side effects to FDA at 1-800-FDA-1088. Where should I keep my medicine? Keep out of the reach of children. This medicine can be abused. Keep your medicine in a safe place to protect it from theft. Do not share this medicine with anyone. Selling or giving away this medicine is dangerous and against the law. This medicine may cause accidental overdose and death if it taken by other adults, children, or pets. Mix any unused medicine with a substance like cat litter or coffee grounds. Then throw the medicine away in a sealed container like a sealed bag or a coffee can with a lid. Do not use the medicine after the expiration date. Store at room temperature between 15 and 30 degrees C (59 and 86 degrees F). NOTE: This sheet is a summary. It may not cover all possible information. If you have questions about this medicine, talk to your doctor, pharmacist, or health care provider.  2018 Elsevier/Gold Standard (2015-02-12 10:26:12)

## 2017-11-13 NOTE — Telephone Encounter (Signed)
There is no MRI order in there.

## 2017-11-13 NOTE — Progress Notes (Signed)
[facility]    Provider:  Dr Jaynee Eagles Referring Provider: Lucretia Kern, DO Primary Care Physician:  Lucretia Kern, DO  CC:  Paresthesias  HPI:  Amanda Cannon is a 72 y.o. female here as requested by Dr. Maudie Mercury for headache. Patient here with husband who also provides much information.   Past medical history osteoporosis, glaucoma, PMR, arthritis, white matter lesions, finger spasms, neuropathy, breast cancer, hypertension, high cholesterol. Years ago patient reportedly had a scar and had minor surgery, a small face lift, and she has numbness in front of the ears due to that but many years later she is experiencing something different, sensory changes on the left side of the face, numbness has spread to the back of her head and ears and she feels like she is under water on the left side of the head and worse with the tv is "blaring". She gets numbness in the left eye lid. She feels like she has water in her head all the time, positional headache, ENT work up was negative. She has numbness on the left side of the face. Worse with getting upset and spreads. She gets a sudden pain in the back of her left eye and above her eye lasts one second and the other sensation is heaviness on the left. No significant pain or shooting, burning, tingling , just heaviness and an uncomfortable feeling in the last year not associated in the time course the face lift. She has extensive white matter changes in her brain and has been diagnosed with that, last imaging done in 2004 brain and 2011 cervical spine. She has multiple falls, imbalance, reported ataxia.  Reviewed notes, labs and imaging from outside physicians, which showed:  Review Dr. Julianne Rice notes.  Patient has facial numbness was started years ago.  Progressively worsening slowly and now reports "I cannot take it anymore".  Symptoms include a numb feeling over the left upper face with tingling.and she has seen multiple neurologists. She had  breast cancer and diagnosed with white matter ischemic disease in the past.   Reviewed notes sent with patient by Dr. Julianne Rice office.  [facility].  MRI of the cervical spine report showed at C3-C4 there is mild right paracentral left paracentral disc protrusion.  There are moderate bilateral neural foraminal stenoses.  At C4-C5 there is minimal degenerative central canal stenosis and market bilateral neuroforaminal stenosis.  At C5-C6 there are moderate bilateral neuroforaminal stenoses.  No evidence of cord compression.  Reviewed notes from [place].  72 year old woman with a history of multiple falls evaluate for intracranial abnormality.  MRI of the brain was performed, reviewed report, impression no acute intracranial abnormality, abnormal enhancement or recent infarct is identified.  Nonspecific white matter signal abnormalities which are felt likely related to chronic microvascular ischemic changes.  Also benign tonsillar ectopia a normal variant.  Review of Systems: Patient complains of symptoms per HPI as well as the following symptoms: Restless legs, memory loss, confusion, numbness, weakness, dizziness, feeling cold, flushing, joint pain, joint swelling, cramps, diarrhea, constipation. Pertinent negatives and positives per HPI. All others negative.   Social History   Socioeconomic History  . Marital status: Married    Spouse name: Not on file  . Number of children: 3  . Years of education: Not on file  . Highest education level: Not on file  Occupational History  . Occupation: Psychologist, occupational  Social Needs  . Financial resource strain: Not on file  . Food insecurity:  Worry: Not on file    Inability: Not on file  . Transportation needs:    Medical: Not on file    Non-medical: Not on file  Tobacco Use  . Smoking status: Former Smoker    Packs/day: 1.00    Years: 7.00    Pack years: 7.00    Last attempt to quit: 01/25/1971    Years since quitting: 46.8    . Smokeless tobacco: Never Used  Substance and Sexual Activity  . Alcohol use: Yes    Alcohol/week: 14.0 standard drinks    Types: 14 Standard drinks or equivalent per week    Comment: two scotches per day with water  . Drug use: No  . Sexual activity: Not on file  Lifestyle  . Physical activity:    Days per week: Not on file    Minutes per session: Not on file  . Stress: Not on file  Relationships  . Social connections:    Talks on phone: Not on file    Gets together: Not on file    Attends religious service: Not on file    Active member of club or organization: Not on file    Attends meetings of clubs or organizations: Not on file    Relationship status: Not on file  . Intimate partner violence:    Fear of current or ex partner: Not on file    Emotionally abused: Not on file    Physically abused: Not on file    Forced sexual activity: Not on file  Other Topics Concern  . 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 Coronary artery disease Mother   . Coronary artery disease Father   . Cancer - Ovarian Sister   . Colon cancer Cousin        x 2  . Esophageal cancer Neg Hx   . Rectal cancer Neg Hx   . Stomach cancer Neg Hx     Past Medical History:  Diagnosis Date  . Alcohol use   . Anemia   . Anxiety   . Asthma   . Basal cell carcinoma    forehead  . Breast cancer ([place]) 1998   left   . Colon polyps   . GERD (gastroesophageal reflux disease)   . Glaucoma   . Hyperlipidemia   . Hypertension   . Hyponatremia    remote, mild  . Mitral valve prolapse   . Osteoarthritis    sees rheumatologist  . Osteoporosis    sees rheumatologist  . Personal history of radiation therapy 1998  . PMR (polymyalgia rheumatica) ([facility])     sees rheumatologist  . Pneumonia   . Prinzmetal angina ([place])   . White matter changes    ? ischemic sm vs per pt    Past Surgical History:  Procedure Laterality Date  . ABDOMINAL HYSTERECTOMY    . BREAST LUMPECTOMY Left 1998   cancer  . CESAREAN SECTION     x 4  . COLONOSCOPY  07/2012   in [place]  . HERNIA REPAIR  2005   abdominal hernia repair  . HYSTEROTOMY    . LYMPH NODE DISSECTION Left   . MASTECTOMY, PARTIAL Left   .  POLYPECTOMY      Current Outpatient Medications  Medication Sig Dispense Refill  . Alirocumab (PRALUENT) 75 MG/ML SOPN Inject 1 pen into the skin every 14 (fourteen) days. 2 pen 11  . amLODipine (NORVASC) 5 MG tablet Take 1 tablet (5 mg total) by mouth daily. 90 tablet 3  . aspirin 81 MG tablet Take 81 mg by mouth daily.    . cetirizine (ZYRTEC) 10 MG tablet Take 10 mg by mouth 2 (two) times daily.     . Cyanocobalamin (B-12 PO) Take by mouth.    . denosumab (PROLIA) 60 MG/ML SOLN injection Inject 60 mg into the skin every 6 (six) months. Administer in upper arm, thigh, or abdomen    . esomeprazole (NEXIUM) 40 MG capsule TAKE ONE CAPSULE BY MOUTH EVERY DAY 30 capsule 0  . furosemide (LASIX) 20 MG tablet Take 0.5 tablets (10 mg total) by mouth 3 (three) times a week. 18 tablet 3  . IRON PO Take 1 tablet by mouth daily.    Marland Kitchen LORazepam (ATIVAN) 1 MG tablet Take 1 mg by mouth at bedtime as needed (spasms).    Marland Kitchen MAGNESIUM PO Take 400 mg by mouth daily.     . valsartan (DIOVAN) 80 MG tablet Take 1 tablet (80 mg total) by mouth daily. 90 tablet 0   No current facility-administered medications for this visit.     Allergies as of 11/13/2017 - Review Complete 11/13/2017  Allergen Reaction Noted  . Tylenol [acetaminophen] Cough 07/15/2015  . Turmeric  04/10/2017  . Erythromycin  05/06/2016  . Erythromycin base Other (See Comments)   . Garlic Swelling 63/87/5643  . Other  05/06/2016  . Sulfa antibiotics Other (See Comments) 05/06/2016    Vitals: BP 133/80    Pulse 98   Ht 4' 8.5" (1.435 m)   Wt 106 lb (48.1 kg)   BMI 23.35 kg/m  Last Weight:  Wt Readings from Last 1 Encounters:  11/13/17 106 lb (48.1 kg)   Last Height:   Ht Readings from Last 1 Encounters:  11/13/17 4' 8.5" (1.435 m)   Physical exam: Exam: Gen: NAD, conversant, well nourised, obese, well groomed                     CV: RRR, no MRG. No Carotid Bruits. No peripheral edema, warm, nontender Eyes: Conjunctivae clear without exudates or hemorrhage  Neuro: Detailed Neurologic Exam  Speech:    Speech is normal; fluent and spontaneous with normal comprehension.  Cognition:    The patient is oriented to person, place, and time;     recent and remote memory intact;     language fluent;     normal attention, concentration,     fund of knowledge Cranial Nerves:    The pupils are equal, round, and reactive to light. The fundi are normal and spontaneous venous pulsations are present. Visual fields are full to finger confrontation. Extraocular movements are intact. Trigeminal sensation is impaired on the left, the muscles of mastication are normal. Mild nasolabial flattening on the left but symmetric on smile may be normal variant. Bilateral L>R ptosis. The palate elevates in the midline. Hearing intact o voice. Voice is normal. Shoulder shrug is normal. The tongue has normal motion without fasciculations.   Coordination:    Normal finger to nose and heel to shin.   Gait:    Heel-toe and tandem gait are normal.   Motor Observation:    No asymmetry, no atrophy, and no involuntary  movements noted. Tone:    Normal muscle tone.    Posture:    Posture is normal. normal erect    Strength: Very difficult motor exam, poor effort and giveway possibly arthritic, no focal weakness but generalized 4-4+     Sensation: intact to LT     Reflex Exam:  DTR's:    Deep tendon reflexes in the upper and lower extremities are 2+ bilaterally.   Toes:    The toes are down equiv going  bilaterally.   Clonus:    Clonus is absent.    Assessment/Plan:  This is a patient with left-sided facial numbness, feelings of fullness in the ear, positional headache, extensive white matter changes on brain, multiple falls and ataxia.   MRI of the brain w/wo contrast: Need repeat MRI brain for progressive ataxia and falls, positional headache, left ear hearing changes, left face numbness, new onset headache after the age of 89 to look for space occupying mass and progression of white matter disease or other etiology such as intracranial hypertension (feels like fluid in her head).   Need to add in trigeminal protocol (MRI Face/head trigeminal) due to numbness left side of face to evaluate for lesion of CNV such as schwannoma, compressive mass, head/neck cancer or other etiology of trigeminal neuropathy/neuralgis.  - Can consider trying Lyrica if above negative  Orders Placed This Encounter  Procedures  . MR BRAIN W WO CONTRAST  . MR FACE/TRIGEMINAL WO/W CM  . B. burgdorfi Antibody  . Basic Metabolic Panel     Sarina Ill, MD  [facility] [address] [place], [place] [address]  Phone [phone] Fax [phone]

## 2017-11-14 NOTE — Telephone Encounter (Signed)
Patient is aware of this she also has their number of [phone] she stated she will give them a call to schedule.

## 2017-11-14 NOTE — Telephone Encounter (Signed)
Medicare/[place]ual of [place] order sent to GI. They will reach out to the pt to schedule.  °

## 2017-11-15 ENCOUNTER — Other Ambulatory Visit (INDEPENDENT_AMBULATORY_CARE_PROVIDER_SITE_OTHER): Payer: Medicare Other

## 2017-11-15 DIAGNOSIS — Z1159 Encounter for screening for other viral diseases: Secondary | ICD-10-CM

## 2017-11-15 DIAGNOSIS — I10 Essential (primary) hypertension: Secondary | ICD-10-CM

## 2017-11-15 LAB — CBC
HEMATOCRIT: 39.2 % (ref 36.0–46.0)
Hemoglobin: 13.3 g/dL (ref 12.0–15.0)
MCHC: 33.9 g/dL (ref 30.0–36.0)
MCV: 98.1 fl (ref 78.0–100.0)
Platelets: 291 10*3/uL (ref 150.0–400.0)
RBC: 3.99 Mil/uL (ref 3.87–5.11)
RDW: 13.1 % (ref 11.5–15.5)
WBC: 6.1 10*3/uL (ref 4.0–10.5)

## 2017-11-16 ENCOUNTER — Encounter: Payer: Self-pay | Admitting: Family Medicine

## 2017-11-16 LAB — LYME, WESTERN BLOT, SERUM (REFLEXED)
IGG P39 AB.: ABSENT
IGG P41 AB.: ABSENT
IGG P58 AB.: ABSENT
IGG P66 AB.: ABSENT
IGM P39 AB.: ABSENT
IGM P41 AB.: ABSENT
IgG P28 Ab.: ABSENT
IgG P30 Ab.: ABSENT
IgG P45 Ab.: ABSENT
IgG P93 Ab.: ABSENT
IgM P23 Ab.: ABSENT
LYME IGM WB: NEGATIVE
Lyme IgG Wb: NEGATIVE

## 2017-11-16 LAB — BASIC METABOLIC PANEL
BUN/Creatinine Ratio: 14 (ref 12–28)
BUN: 13 mg/dL (ref 8–27)
CHLORIDE: 95 mmol/L — AB (ref 96–106)
CO2: 18 mmol/L — AB (ref 20–29)
Calcium: 10.4 mg/dL — ABNORMAL HIGH (ref 8.7–10.3)
Creatinine, Ser: 0.9 mg/dL (ref 0.57–1.00)
GFR calc Af Amer: 73 mL/min/{1.73_m2} (ref >59)
GFR calc non Af Amer: 64 mL/min/{1.73_m2} (ref >59)
GLUCOSE: 94 mg/dL (ref 65–99)
POTASSIUM: 4.3 mmol/L (ref 3.5–5.2)
SODIUM: 137 mmol/L (ref 134–144)

## 2017-11-16 LAB — B. BURGDORFI ANTIBODIES: Lyme IgG/IgM Ab: 1.21 {ISR} — ABNORMAL HIGH (ref 0.00–0.90)

## 2017-11-17 ENCOUNTER — Other Ambulatory Visit: Payer: Self-pay | Admitting: Family Medicine

## 2017-11-17 MED ORDER — VALSARTAN 80 MG PO TABS: 80.0000 mg | ORAL_TABLET | Freq: Every day | ORAL | 0 refills | Status: DC

## 2017-11-17 MED ORDER — FUROSEMIDE 20 MG PO TABS: 10.0000 mg | ORAL_TABLET | ORAL | 0 refills | Status: DC

## 2017-11-17 NOTE — Telephone Encounter (Signed)
Requested medication (s) are due for refill today: yes  Requested medication (s) are on the active medication list: yes  Last refill:  12/29/16  Future visit scheduled: yes  Notes to clinic:  Calcium out of range    Requested Prescriptions  Pending Prescriptions Disp Refills   furosemide (LASIX) 20 MG tablet 18 tablet 3    Sig: Take 0.5 tablets (10 mg total) by mouth 3 (three) times a week.     Cardiovascular:  Diuretics - Loop Failed - 11/17/2017  9:37 AM      Failed - Ca in normal range and within 360 days    Calcium  Date Value Ref Range Status  11/13/2017 10.4 (H) 8.7 - 10.3 mg/dL Final         Passed - K in normal range and within 360 days    Potassium  Date Value Ref Range Status  11/13/2017 4.3 3.5 - 5.2 mmol/L Final         Passed - Na in normal range and within 360 days    Sodium  Date Value Ref Range Status  11/13/2017 137 134 - 144 mmol/L Final         Passed - Cr in normal range and within 360 days    Creatinine, Ser  Date Value Ref Range Status  11/13/2017 0.90 0.57 - 1.00 mg/dL Final         Passed - Last BP in normal range    BP Readings from Last 1 Encounters:  11/13/17 133/80         Passed - Valid encounter within last 6 months    Recent Outpatient Visits          1 week ago Other hyperlipidemia   [facility], [place], DO   2 months ago [place] [facility], [place], DO   6 months ago Essential hypertension   Therapist[facility], [place], DO   1 year ago Calcification of left carotid artery   Therapist, [facility], [place], DO   1 year ago Prinzmetal angina ([place])   Therapist, [facility] at [facility], [place] [employer], DO      Future Appointments            In 3 months Maudie Mercury, [place] [facility] [place], [facility]         Signed Prescriptions Disp Refills   valsartan (DIOVAN) 80 MG tablet 90 tablet 0    Sig: Take 1 tablet (80 mg total)  by mouth daily.     Cardiovascular:  Angiotensin Receptor Blockers Passed - 11/17/2017  9:37 AM      Passed - Cr in normal range and within 180 days    Creatinine, Ser  Date Value Ref Range Status  11/13/2017 0.90 0.57 - 1.00 mg/dL Final         Passed - K in normal range and within 180 days    Potassium  Date Value Ref Range Status  11/13/2017 4.3 3.5 - 5.2 mmol/L Final         Passed - Patient is not pregnant      Passed - Last BP in normal range    BP Readings from Last 1 Encounters:  11/13/17 133/80         Passed - Valid encounter within last 6 months    Recent Outpatient Visits          1 week  ago Other hyperlipidemia   Therapist, [facility], [place], DO   2 months ago [place] [facility], [place], DO   6 months ago Essential hypertension   Therapist, [facility] at [facility], [place], DO   1 year ago Calcification of left carotid artery   Therapist, [facility], [place], DO   1 year ago Prinzmetal angina ([place])   Therapist, [facility] at [facility], [place], DO      Future Appointments            In 3 months Maudie Mercury, [place], DO [employer] at [place], [facility] [place]

## 2017-11-17 NOTE — Telephone Encounter (Signed)
Copied from [place] [id number]. Topic: General - Other >> Nov 17, 2017  9:23 AM Janace Aris A wrote: Medication: furosemide (LASIX) 20 MG tablet, valsartan (DIOVAN) 80 MG tablet  Has the patient contacted their pharmacy? Yes  Preferred Pharmacy (with phone number or street name): [id number] [address], [place]  [phone] (Phone) [phone] (Fax)    Agent: Please be advised that RX refills may take up to 3 business days. We ask that you follow-up with your pharmacy.

## 2017-11-21 ENCOUNTER — Telehealth: Payer: Self-pay | Admitting: Pharmacist

## 2017-11-21 NOTE — Telephone Encounter (Signed)
Pt called clinic regarding Praluent patient assistance - she will come in tomorrow afternoon to fill out PASS application for [phone].

## 2017-11-23 ENCOUNTER — Ambulatory Visit
Admission: RE | Admit: 2017-11-23 | Discharge: 2017-11-23 | Disposition: A | Discharge status: Home / Self Care | Payer: Medicare Other | Source: Ambulatory Visit | Attending: Neurology | Admitting: Neurology

## 2017-11-23 DIAGNOSIS — G509 Disorder of trigeminal nerve, unspecified: Secondary | ICD-10-CM

## 2017-11-23 DIAGNOSIS — R202 Paresthesia of skin: Secondary | ICD-10-CM

## 2017-11-23 DIAGNOSIS — R200 Anesthesia of skin: Secondary | ICD-10-CM

## 2017-11-23 DIAGNOSIS — W19XXXA Unspecified fall, initial encounter: Secondary | ICD-10-CM

## 2017-11-23 DIAGNOSIS — G3281 Cerebellar ataxia in diseases classified elsewhere: Secondary | ICD-10-CM | POA: Diagnosis not present

## 2017-11-23 DIAGNOSIS — G500 Trigeminal neuralgia: Secondary | ICD-10-CM

## 2017-11-23 DIAGNOSIS — R51 Headache: Secondary | ICD-10-CM

## 2017-11-23 DIAGNOSIS — R270 Ataxia, unspecified: Secondary | ICD-10-CM

## 2017-11-23 DIAGNOSIS — H5712 Ocular pain, left eye: Secondary | ICD-10-CM

## 2017-11-23 DIAGNOSIS — H9192 Unspecified hearing loss, left ear: Secondary | ICD-10-CM

## 2017-11-23 DIAGNOSIS — R519 Headache, unspecified: Secondary | ICD-10-CM

## 2017-11-23 DIAGNOSIS — R9082 White matter disease, unspecified: Secondary | ICD-10-CM

## 2017-11-23 MED ORDER — GADOBENATE DIMEGLUMINE 529 MG/ML IV SOLN
15.0000 mL | Freq: Once | INTRAVENOUS | Status: AC | PRN
  Administered 2017-11-23: 15 mL via INTRAVENOUS

## 2017-11-27 DIAGNOSIS — M2550 Pain in unspecified joint: Secondary | ICD-10-CM | POA: Diagnosis not present

## 2017-11-27 DIAGNOSIS — Z6823 Body mass index (BMI) 23.0-23.9, adult: Secondary | ICD-10-CM | POA: Diagnosis not present

## 2017-11-27 DIAGNOSIS — M5030 Other cervical disc degeneration, unspecified cervical region: Secondary | ICD-10-CM | POA: Diagnosis not present

## 2017-11-27 DIAGNOSIS — M150 Primary generalized (osteo)arthritis: Secondary | ICD-10-CM | POA: Diagnosis not present

## 2017-11-27 DIAGNOSIS — M810 Age-related osteoporosis without current pathological fracture: Secondary | ICD-10-CM | POA: Diagnosis not present

## 2017-11-28 ENCOUNTER — Other Ambulatory Visit: Payer: Self-pay | Admitting: Internal Medicine

## 2017-11-28 DIAGNOSIS — M810 Age-related osteoporosis without current pathological fracture: Secondary | ICD-10-CM

## 2017-11-30 ENCOUNTER — Other Ambulatory Visit: Payer: Self-pay | Admitting: Neurology

## 2017-11-30 MED ORDER — GABAPENTIN 300 MG PO CAPS: 300.0000 mg | ORAL_CAPSULE | Freq: Three times a day (TID) | ORAL | 11 refills | Status: DC

## 2017-12-06 ENCOUNTER — Encounter: Payer: Self-pay | Admitting: *Deleted

## 2017-12-12 ENCOUNTER — Other Ambulatory Visit: Payer: Self-pay | Admitting: Family Medicine

## 2017-12-14 ENCOUNTER — Encounter: Payer: Self-pay | Admitting: Family Medicine

## 2017-12-14 DIAGNOSIS — Z853 Personal history of malignant neoplasm of breast: Secondary | ICD-10-CM

## 2017-12-14 DIAGNOSIS — N61 Mastitis without abscess: Secondary | ICD-10-CM

## 2017-12-18 DIAGNOSIS — Z7982 Long term (current) use of aspirin: Secondary | ICD-10-CM | POA: Diagnosis not present

## 2017-12-18 DIAGNOSIS — N644 Mastodynia: Secondary | ICD-10-CM | POA: Diagnosis not present

## 2017-12-18 DIAGNOSIS — Z853 Personal history of malignant neoplasm of breast: Secondary | ICD-10-CM | POA: Diagnosis not present

## 2017-12-18 DIAGNOSIS — Z87891 Personal history of nicotine dependence: Secondary | ICD-10-CM | POA: Diagnosis not present

## 2017-12-19 ENCOUNTER — Encounter: Payer: Self-pay | Admitting: Family Medicine

## 2017-12-19 NOTE — Telephone Encounter (Signed)
See prior phone note. 

## 2017-12-19 NOTE — Telephone Encounter (Signed)
I called the pt and informed her of the message below.  She is aware the referral was placed.  I also advised her to contact the physicians office she was seen by for mastitis and have them send the notes to the cancer center.  Phone number for [place] center was given to the pt.

## 2018-01-11 ENCOUNTER — Ambulatory Visit: Payer: Medicare Other | Admitting: Family Medicine

## 2018-01-11 ENCOUNTER — Telehealth: Payer: Self-pay

## 2018-01-11 NOTE — Telephone Encounter (Signed)
See prior note in husband's chart.

## 2018-01-11 NOTE — Telephone Encounter (Signed)
Copied from [place] [phone]. Topic: General - Inquiry >> Jan 11, 2018  1:39 PM Vernona Rieger wrote: Reason for CRM: Patient called and wanted to let Dr Julianne Rice nurse know that she does not need a handicap sticker. She said she thinks there was some confusion there.

## 2018-01-15 DIAGNOSIS — M810 Age-related osteoporosis without current pathological fracture: Secondary | ICD-10-CM | POA: Diagnosis not present

## 2018-01-15 DIAGNOSIS — N644 Mastodynia: Secondary | ICD-10-CM | POA: Insufficient documentation

## 2018-01-15 DIAGNOSIS — Z170 Estrogen receptor positive status [ER+]: Secondary | ICD-10-CM | POA: Diagnosis not present

## 2018-01-15 DIAGNOSIS — C50512 Malignant neoplasm of lower-outer quadrant of left female breast: Secondary | ICD-10-CM | POA: Insufficient documentation

## 2018-01-15 HISTORY — DX: Mastodynia: N64.4

## 2018-01-15 HISTORY — DX: Age-related osteoporosis without current pathological fracture: M81.0

## 2018-01-19 ENCOUNTER — Other Ambulatory Visit: Payer: Self-pay | Admitting: Oncology

## 2018-01-19 DIAGNOSIS — N644 Mastodynia: Secondary | ICD-10-CM

## 2018-01-22 ENCOUNTER — Ambulatory Visit
Admission: RE | Admit: 2018-01-22 | Discharge: 2018-01-22 | Disposition: A | Discharge status: Home / Self Care | Payer: Medicare Other | Source: Ambulatory Visit | Attending: Internal Medicine | Admitting: Internal Medicine

## 2018-01-22 DIAGNOSIS — M8589 Other specified disorders of bone density and structure, multiple sites: Secondary | ICD-10-CM | POA: Diagnosis not present

## 2018-01-22 DIAGNOSIS — Z780 Asymptomatic menopausal state: Secondary | ICD-10-CM | POA: Diagnosis not present

## 2018-01-22 DIAGNOSIS — M810 Age-related osteoporosis without current pathological fracture: Secondary | ICD-10-CM

## 2018-01-23 ENCOUNTER — Ambulatory Visit
Admission: RE | Admit: 2018-01-23 | Discharge: 2018-01-23 | Disposition: A | Discharge status: Home / Self Care | Payer: Medicare Other | Source: Ambulatory Visit | Attending: Oncology | Admitting: Oncology

## 2018-01-23 DIAGNOSIS — Z853 Personal history of malignant neoplasm of breast: Secondary | ICD-10-CM | POA: Diagnosis not present

## 2018-01-23 DIAGNOSIS — N644 Mastodynia: Secondary | ICD-10-CM

## 2018-01-23 DIAGNOSIS — R928 Other abnormal and inconclusive findings on diagnostic imaging of breast: Secondary | ICD-10-CM | POA: Diagnosis not present

## 2018-01-31 ENCOUNTER — Other Ambulatory Visit: Payer: Self-pay | Admitting: Neurology

## 2018-01-31 MED ORDER — PREGABALIN 50 MG PO CAPS: 50.0000 mg | ORAL_CAPSULE | Freq: Two times a day (BID) | ORAL | 5 refills | Status: DC

## 2018-02-01 ENCOUNTER — Telehealth: Payer: Self-pay | Admitting: *Deleted

## 2018-02-01 NOTE — Telephone Encounter (Signed)
PA completed for Pregabalin 50 mg on Cover My Meds.KEY: [id number].   "If [employer] has not responded in 1-3 days or if you have any questions about your ePA request, please contact [place] [employer] Part [place] at [phone]. "

## 2018-02-03 ENCOUNTER — Other Ambulatory Visit: Payer: Self-pay | Admitting: Neurology

## 2018-02-03 MED ORDER — GABAPENTIN 300 MG PO CAPS: 300.0000 mg | ORAL_CAPSULE | Freq: Three times a day (TID) | ORAL | 11 refills | Status: DC

## 2018-02-05 DIAGNOSIS — Z170 Estrogen receptor positive status [ER+]: Secondary | ICD-10-CM | POA: Diagnosis not present

## 2018-02-05 DIAGNOSIS — N644 Mastodynia: Secondary | ICD-10-CM | POA: Diagnosis not present

## 2018-02-05 DIAGNOSIS — C50512 Malignant neoplasm of lower-outer quadrant of left female breast: Secondary | ICD-10-CM | POA: Diagnosis not present

## 2018-02-06 ENCOUNTER — Other Ambulatory Visit: Payer: Self-pay | Admitting: *Deleted

## 2018-02-06 ENCOUNTER — Other Ambulatory Visit: Payer: Self-pay | Admitting: Neurology

## 2018-02-06 MED ORDER — PREGABALIN 50 MG PO CAPS: 50.0000 mg | ORAL_CAPSULE | Freq: Two times a day (BID) | ORAL | 3 refills | Status: DC

## 2018-02-09 DIAGNOSIS — H40053 Ocular hypertension, bilateral: Secondary | ICD-10-CM | POA: Diagnosis not present

## 2018-02-13 ENCOUNTER — Ambulatory Visit: Payer: Medicare Other | Admitting: Neurology

## 2018-02-13 ENCOUNTER — Other Ambulatory Visit: Payer: Self-pay | Admitting: Family Medicine

## 2018-02-16 ENCOUNTER — Other Ambulatory Visit: Payer: Self-pay | Admitting: Family Medicine

## 2018-02-16 ENCOUNTER — Other Ambulatory Visit: Payer: Self-pay | Admitting: Neurology

## 2018-02-18 ENCOUNTER — Other Ambulatory Visit: Payer: Self-pay | Admitting: Neurology

## 2018-02-18 MED ORDER — PREGABALIN 50 MG PO CAPS: 50.0000 mg | ORAL_CAPSULE | Freq: Three times a day (TID) | ORAL | 3 refills | Status: DC

## 2018-02-19 DIAGNOSIS — F4322 Adjustment disorder with anxiety: Secondary | ICD-10-CM | POA: Diagnosis not present

## 2018-03-05 DIAGNOSIS — N644 Mastodynia: Secondary | ICD-10-CM | POA: Diagnosis not present

## 2018-03-05 DIAGNOSIS — C50512 Malignant neoplasm of lower-outer quadrant of left female breast: Secondary | ICD-10-CM | POA: Diagnosis not present

## 2018-03-05 DIAGNOSIS — Z170 Estrogen receptor positive status [ER+]: Secondary | ICD-10-CM | POA: Diagnosis not present

## 2018-03-06 ENCOUNTER — Telehealth: Payer: Self-pay | Admitting: Pharmacist

## 2018-03-06 NOTE — Telephone Encounter (Signed)
Patient calls stating she is out of praluent and her PASS application hasnt been reviewed. Requesting a sample. Offered to send in Rx to see the cost this year or she could call insurance company to see if more affordable this year. Patient declined offer stating "this is a gimic, they get you hooked on it and then they want you to pay for it" Will provide one sample

## 2018-03-06 NOTE — Telephone Encounter (Signed)
Received approval notification through PASS program for Praluent. Patient is aware and will call them to coordinate her refills.

## 2018-03-08 NOTE — Progress Notes (Signed)
HPI:  Using dictation device. Unfortunately this device frequently misinterprets words/phrases.  Amanda Cannon is a pleasant 73 y.o. here for follow up. Chronic medical problems summarized below were reviewed for changes and stability and were updated as needed below. These issues and their treatment remain stable for the most part. Reports doing well. Medication for trig neuro helping some. Has ENT visit later this month. Declined tetanus booster today as is traveling tomorrow. Takes lasix 3 days per week for LE edema - occ gets some mild edema despite this if sitting a lot and wonders if could take 4 days per week then. No reported CP, SOB, DOE, treatment intolerance or new symptoms.  Declined tetanus booster  She had her AWV with Manuela Schwartz 11/09/17  MVP/HTN/Prinzmetal angina/HLD/CAD: -seeing Dr. Tamala Julian, cardiology -lipids done 10/2015 - quite high -does not toleratestatins, seeing lipid clinic -meds:praluent,norvasc, asa 81 mg, nexium 79m, lasix, irbesartan, ativan for prinzmetal dzin the past -regular exercise, diet is pretty good  Chronic paresthesias: -started "years ago" -L sided upper face tingling -reportedly had eval with MRI (microvascular dz) ENT and several neurologist -seeing cardiology for cholesterol, on asa -currently under re-evaluation with [facility], Dr. AJaynee Eaglesand seeing ENT  Hx PMR/chronic muscle spasms/elevated CRP/ESR/Osteoporosis/Osteoarthritis: -seeing Dr. BAmil Amenq 61month-on prolia  -lots of random aches and painschronically  GERD/stomach aches: - sees GI  -intermittent loose and hard stools -drinks several alcoholic drinks per day - I had difficulty getting her to really give me a number but she reports drinks scotch mixed with water as restricts water due to remote dx mild hyponatremia.  Reports she feels the alcohol actually helps her health.  Hx Breast ca: -1998 -neg bracaper her report -s/p partial mastectomy L breast, hysterectomyand  oophorectomy  Seasonal allergies: -takes zyrtec and this helps  Depression/Anxiety: -remotely had depression -fine since off bb- feels bb caused depression -hx alcohol use   ROS: See pertinent positives and negatives per HPI.  Past Medical History:  Diagnosis Date  . Alcohol use   . Anemia   . Anxiety   . Asthma   . Basal cell carcinoma    forehead  . [place]   [place]   . Colon polyps   . GERD (gastroesophageal reflux disease)   . Glaucoma   . Hyperlipidemia   . Hypertension   . Hyponatremia    remote, mild  . Mitral valve prolapse   . Osteoarthritis    sees rheumatologist  . Osteoporosis    sees rheumatologist  . Personal history of radiation therapy 1998  . PMR (polymyalgia rheumatica) ([facility])    sees rheumatologist  . Pneumonia   . [place]  . White matter changes    ? ischemic sm vs per pt    Past Surgical History:  Procedure Laterality Date  . ABDOMINAL HYSTERECTOMY    . BREAST LUMPECTOMY Left 1998   cancer  . CESAREAN SECTION     x 4  . COLONOSCOPY  07/2012   in [place]  . HERNIA REPAIR  2005   abdominal hernia repair  . HYSTEROTOMY    . LYMPH NODE DISSECTION Left   . POLYPECTOMY      Family History  Problem Relation Age of Onset  . Coronary artery disease Mother   . Coronary artery disease Father   . Cancer - Ovarian Sister   . Colon cancer Cousin        x 2  . Esophageal cancer Neg Hx   . Rectal  cancer Neg Hx   . Stomach cancer Neg Hx     SOCIAL HX: see hpi   Current Outpatient Medications:  .  Alirocumab (PRALUENT) 75 MG/ML SOPN, Inject 1 pen into the skin every 14 (fourteen) days., Disp: 2 pen, Rfl: 11 .  amLODipine (NORVASC) 5 MG tablet, Take 1 tablet (5 mg total) by mouth daily., Disp: 90 tablet, Rfl: 3 .  aspirin 81 MG tablet, Take 81 mg by mouth daily., Disp: , Rfl:  .  cetirizine (ZYRTEC) 10 MG tablet, Take 10 mg by mouth 2 (two) times daily. , Disp: , Rfl:  .  Cyanocobalamin (B-12 PO), Take by  mouth., Disp: , Rfl:  .  denosumab (PROLIA) 60 MG/ML SOLN injection, Inject 60 mg into the skin every 6 (six) months. Administer in upper arm, thigh, or abdomen, Disp: , Rfl:  .  esomeprazole (NEXIUM) 40 MG capsule, TAKE ONE CAPSULE BY MOUTH EVERY DAY, Disp: 30 capsule, Rfl: 0 .  furosemide (LASIX) 20 MG tablet, TAKE 1/2 TABLET BY MOUTH 3 TIMES A WEEK, Disp: 18 tablet, Rfl: 0 .  IRON PO, Take 1 tablet by mouth daily., Disp: , Rfl:  .  LORazepam (ATIVAN) 1 MG tablet, Take 1 mg by mouth at bedtime as needed (spasms)., Disp: , Rfl:  .  MAGNESIUM PO, Take 400 mg by mouth daily. , Disp: , Rfl:  .  pregabalin (LYRICA) 50 MG capsule, Take 1 capsule (50 mg total) by mouth 3 (three) times daily. (Patient taking differently: Take 50 mg by mouth at bedtime. ), Disp: 270 capsule, Rfl: 3 .  valsartan (DIOVAN) 80 MG tablet, TAKE 1 TABLET BY MOUTH EVERY DAY, Disp: 90 tablet, Rfl: 0  EXAM:  Vitals:   03/12/18 0842  BP: 102/70  Pulse: 87  Temp: 97.9 F (36.6 C)    Body mass index is 24.25 kg/m.  GENERAL: vitals reviewed and listed above, alert, oriented, appears well hydrated and in no acute distress  HEENT: atraumatic, conjunttiva clear, no obvious abnormalities on inspection of external nose and ears  NECK: no obvious masses on inspection  LUNGS: clear to auscultation bilaterally, no wheezes, rales or rhonchi, good air movement  CV: HRRR, no peripheral edema  MS: moves all extremities without noticeable abnormality  PSYCH: pleasant and cooperative, no obvious depression or anxiety  ASSESSMENT AND PLAN:  Discussed the following assessment and plan:  Essential hypertension - Plan: Basic metabolic panel  Trigeminal neuropathy  Other hyperlipidemia  Need for hepatitis C screening test - Plan: Hepatitis C antibody  Leg edema -labs per orders -lifestyle recs -prn extra dose lasix per week, compression, elevation for reported mild edema at times in ankles -follow up 6 months, sooner as  needed   Patient Instructions  BEFORE YOU LEAVE: -labs -follow up: 6 months   Get your tetanus booster  We have ordered labs or studies at this visit. It can take up to 1-2 weeks for results and processing. IF results require follow up or explanation, we will call you with instructions. Clinically stable results will be released to your [facility]. If you have not heard from [place] or cannot find your results in [facility] in 2 weeks please contact our office at [phone].  If you are not yet signed up for [facility], please consider signing up.   We recommend the following healthy lifestyle for LIFE: 1) Small portions. But, make sure to get regular (at least 3 per day), healthy meals and small healthy snacks if needed.  2) Eat a  healthy clean diet.   TRY TO EAT: -at least 5-7 servings of low sugar, colorful, and nutrient rich vegetables per day (not corn, potatoes or bananas.) -berries are the best choice if you wish to eat fruit (only eat small amounts if trying to reduce weight)  -lean meets (fish, white meat of chicken or [place]) -vegan proteins for some meals - beans or tofu, whole grains, nuts and seeds -Replace bad fats with good fats - good fats include: fish, nuts and seeds, canola oil, olive oil -small amounts of low fat or non fat dairy -small amounts of100 % whole grains - check the lables -drink plenty of water  AVOID: -SUGAR, sweets, anything with added sugar, corn syrup or sweeteners - must read labels as even foods advertised as "healthy" often are loaded with sugar -if you must have a sweetener, small amounts of stevia may be best -sweetened beverages and artificially sweetened beverages -simple starches (rice, bread, potatoes, pasta, chips, etc - small amounts of 100% whole grains are ok) -red meat, pork, butter -fried foods, fast food, processed food, excessive dairy, eggs and coconut.  3)Get at least 150 minutes of sweaty aerobic exercise per week.  4)Reduce stress -  consider counseling, meditation and relaxation to balance other aspects of your life.          Lucretia Kern, DO

## 2018-03-12 ENCOUNTER — Ambulatory Visit (INDEPENDENT_AMBULATORY_CARE_PROVIDER_SITE_OTHER): Payer: Medicare Other | Admitting: Family Medicine

## 2018-03-12 ENCOUNTER — Encounter: Payer: Self-pay | Admitting: Family Medicine

## 2018-03-12 VITALS — BP 102/70 | HR 87 | Temp 97.9°F | Ht <= 58 in | Wt 110.1 lb

## 2018-03-12 DIAGNOSIS — Z1159 Encounter for screening for other viral diseases: Secondary | ICD-10-CM

## 2018-03-12 DIAGNOSIS — G509 Disorder of trigeminal nerve, unspecified: Secondary | ICD-10-CM

## 2018-03-12 DIAGNOSIS — R600 Localized edema: Secondary | ICD-10-CM | POA: Diagnosis not present

## 2018-03-12 DIAGNOSIS — E7849 Other hyperlipidemia: Secondary | ICD-10-CM | POA: Diagnosis not present

## 2018-03-12 DIAGNOSIS — I10 Essential (primary) hypertension: Secondary | ICD-10-CM | POA: Diagnosis not present

## 2018-03-12 LAB — BASIC METABOLIC PANEL
BUN: 14 mg/dL (ref 6–23)
CHLORIDE: 93 meq/L — AB (ref 96–112)
CO2: 25 meq/L (ref 19–32)
Calcium: 10.5 mg/dL (ref 8.4–10.5)
Creatinine, Ser: 0.86 mg/dL (ref 0.40–1.20)
GFR: 64.61 mL/min (ref >60.00)
Glucose, Bld: 85 mg/dL (ref 70–99)
POTASSIUM: 4.6 meq/L (ref 3.5–5.1)
Sodium: 130 mEq/L — ABNORMAL LOW (ref 135–145)

## 2018-03-12 NOTE — Patient Instructions (Addendum)
BEFORE YOU LEAVE: -labs -follow up: 6 months   Get your tetanus booster  We have ordered labs or studies at this visit. It can take up to 1-2 weeks for results and processing. IF results require follow up or explanation, we will call you with instructions. Clinically stable results will be released to your [facility]. If you have not heard from [place] or cannot find your results in [facility] in 2 weeks please contact our office at [phone].  If you are not yet signed up for [facility], please consider signing up.   We recommend the following healthy lifestyle for LIFE: 1) Small portions. But, make sure to get regular (at least 3 per day), healthy meals and small healthy snacks if needed.  2) Eat a healthy clean diet.   TRY TO EAT: -at least 5-7 servings of low sugar, colorful, and nutrient rich vegetables per day (not corn, potatoes or bananas.) -berries are the best choice if you wish to eat fruit (only eat small amounts if trying to reduce weight)  -lean meets (fish, white meat of chicken or [place]) -vegan proteins for some meals - beans or tofu, whole grains, nuts and seeds -Replace bad fats with good fats - good fats include: fish, nuts and seeds, canola oil, olive oil -small amounts of low fat or non fat dairy -small amounts of100 % whole grains - check the lables -drink plenty of water  AVOID: -SUGAR, sweets, anything with added sugar, corn syrup or sweeteners - must read labels as even foods advertised as "healthy" often are loaded with sugar -if you must have a sweetener, small amounts of stevia may be best -sweetened beverages and artificially sweetened beverages -simple starches (rice, bread, potatoes, pasta, chips, etc - small amounts of 100% whole grains are ok) -red meat, pork, butter -fried foods, fast food, processed food, excessive dairy, eggs and coconut.  3)Get at least 150 minutes of sweaty aerobic exercise per week.  4)Reduce stress - consider counseling, meditation  and relaxation to balance other aspects of your life.

## 2018-03-13 LAB — HEPATITIS C ANTIBODY
HEP C AB: NONREACTIVE
SIGNAL TO CUT-OFF: 0.59 (ref <1.00)

## 2018-03-15 ENCOUNTER — Encounter: Payer: Self-pay | Admitting: Family Medicine

## 2018-03-15 DIAGNOSIS — E871 Hypo-osmolality and hyponatremia: Secondary | ICD-10-CM

## 2018-03-23 DIAGNOSIS — R200 Anesthesia of skin: Secondary | ICD-10-CM

## 2018-03-23 DIAGNOSIS — H6983 Other specified disorders of Eustachian tube, bilateral: Secondary | ICD-10-CM | POA: Insufficient documentation

## 2018-03-23 DIAGNOSIS — H9193 Unspecified hearing loss, bilateral: Secondary | ICD-10-CM

## 2018-03-23 DIAGNOSIS — H6993 Unspecified Eustachian tube disorder, bilateral: Secondary | ICD-10-CM

## 2018-03-23 HISTORY — DX: Unspecified hearing loss, bilateral: H91.93

## 2018-03-23 HISTORY — DX: Anesthesia of skin: R20.0

## 2018-03-23 HISTORY — DX: Unspecified eustachian tube disorder, bilateral: H69.93

## 2018-04-19 ENCOUNTER — Telehealth: Payer: Self-pay | Admitting: *Deleted

## 2018-04-19 NOTE — Telephone Encounter (Signed)
Spoke with patient about her 3/31 appt and advised that d/t covid 19 pandemic, our office is severely reducing in person visits for at least the next two week, in order to minimize the risk to our patients and healthcare providers. Advised that we recommend patient convert to a telemedicine visit. Although there may be some limitations with this type of visit, we will take all precautions to reduce any security or privacy concerns. This will be treated like an in-office visit and we will file with your insurance, and there may be a patient responsible charge related to this service. Patient provided verbal consent over phone to do a telephone call for visit and to file the visit with her insurance. Patient would like to keep the same time and she would like appt to be with Dr. Jaynee Eagles. I advised that she would receive a call from nursing one day prior to appt to update her medical history & medications. She verbalized understanding and appreciation.

## 2018-04-23 ENCOUNTER — Encounter: Payer: Self-pay | Admitting: *Deleted

## 2018-04-23 NOTE — Telephone Encounter (Signed)
Spoke with patient and discussed appt today. I updated/reviewed pt's medical/sx/social/fam hx, meds & allergies. She said she would like to do a video visit. I obtained her consent to file ins for video visit. Confirmed her email address as already on file. I reviewed instructions on downloading the [employer] webex app and advised pt that video & audio will need to be functioning on her smart phone. I emailed pt an invitation to join the webex meeting. Appt time remains the same, 9:30 on 04/24/2018.   She stated she saw Dr. Wilburn Cornelia. The Lyrica is not helping her ear. She thinks its her eustachian tube.

## 2018-04-23 NOTE — Addendum Note (Signed)
Addended by: Gildardo Griffes on: 04/23/2018 02:50 PM   Modules accepted: Orders

## 2018-04-23 NOTE — Telephone Encounter (Signed)
Noy sure I can do anything about her eustacian tube but happy to see her tomorrow thanks

## 2018-04-24 ENCOUNTER — Encounter: Payer: Self-pay | Admitting: Neurology

## 2018-04-24 ENCOUNTER — Ambulatory Visit (INDEPENDENT_AMBULATORY_CARE_PROVIDER_SITE_OTHER): Payer: Medicare Other | Admitting: Neurology

## 2018-04-24 ENCOUNTER — Other Ambulatory Visit: Payer: Self-pay

## 2018-04-24 ENCOUNTER — Encounter

## 2018-04-24 DIAGNOSIS — R413 Other amnesia: Secondary | ICD-10-CM | POA: Diagnosis not present

## 2018-04-24 DIAGNOSIS — G500 Trigeminal neuralgia: Secondary | ICD-10-CM

## 2018-04-24 MED ORDER — PREGABALIN 50 MG PO CAPS: ORAL_CAPSULE | ORAL | 4 refills | Status: DC

## 2018-04-24 NOTE — Progress Notes (Addendum)
[facility]    Provider:  Dr Jaynee Eagles Referring Provider: Lucretia Kern, DO Primary Care Physician:  Lucretia Kern, DO  CC:  Paresthesias  Virtual Visit via Video Note  I connected with Amanda Cannon on 05/19/18 at  9:30 AM EDT by a video enabled telemedicine application and verified that I am speaking with the correct person using two identifiers.   I discussed the limitations of evaluation and management by telemedicine and the availability of in person appointments. The patient expressed understanding and agreed to proceed. Patient at home, physician in the office.   Melvenia Beam, MD   Interval history: she feels her eustacian tube is involved. She went to Dr. Wilburn Cornelia. She feels it gets blocked. Flonase, decongestants doesn't help. She loses her words. It may be worse with the Lyrica but it was ongoing before. Feels it is getting worse. She notices and her family notices that she forgets conversations. Discussed the MRI brain. She has mild chronic changes small-vessel disease and mild atrophy.   HPI:  Amanda Cannon is a 74 y.o. female here as requested by Dr. Maudie Mercury for headache. Patient here with husband who also provides much information.   Past medical history osteoporosis, glaucoma, PMR, arthritis, white matter lesions, finger spasms, neuropathy, breast cancer, hypertension, high cholesterol. Years ago patient reportedly had a scar and had minor surgery, a small face lift, and she has numbness in front of the ears due to that but many years later she is experiencing something different, sensory changes on the left side of the face, numbness has spread to the back of her head and ears and she feels like she is under water on the left side of the head and worse with the tv is "blaring". She gets numbness in the left eye lid. She feels like she has water in her head all the time, positional headache, ENT work up was negative. She has numbness on the left side of  the face. Worse with getting upset and spreads. She gets a sudden pain in the back of her left eye and above her eye lasts one second and the other sensation is heaviness on the left. No significant pain or shooting, burning, tingling , just heaviness and an uncomfortable feeling in the last year not associated in the time course the face lift. She has extensive white matter changes in her brain and has been diagnosed with that, last imaging done in 2004 brain and 2011 cervical spine. She has multiple falls, imbalance, reported ataxia.  Reviewed notes, labs and imaging from outside physicians, which showed:  Review Dr. Julianne Rice notes.  Patient has facial numbness was started years ago.  Progressively worsening slowly and now reports "I cannot take it anymore".  Symptoms include a numb feeling over the left upper face with tingling.and she has seen multiple neurologists. She had breast cancer and diagnosed with white matter ischemic disease in the past.   Reviewed notes sent with patient by Dr. Julianne Rice office.  [facility].  MRI of the cervical spine report showed at C3-C4 there is mild right paracentral left paracentral disc protrusion.  There are moderate bilateral neural foraminal stenoses.  At C4-C5 there is minimal degenerative central canal stenosis and market bilateral neuroforaminal stenosis.  At C5-C6 there are moderate bilateral neuroforaminal stenoses.  No evidence of cord compression.  Reviewed notes from [facility].  73 year old woman with a history of multiple falls evaluate for intracranial abnormality.  MRI of the brain  was performed, reviewed report, impression no acute intracranial abnormality, abnormal enhancement or recent infarct is identified.  Nonspecific white matter signal abnormalities which are felt likely related to chronic microvascular ischemic changes.  Also benign tonsillar ectopia a normal variant.  Review of Systems: Patient complains of symptoms per  HPI as well as the following symptoms: Restless legs, memory loss, confusion, numbness, weakness, dizziness, feeling cold, flushing, joint pain, joint swelling, cramps, diarrhea, constipation. Pertinent negatives and positives per HPI. All others negative.   Social History   Socioeconomic History   Marital status: Married    Spouse name: Not on file   Number of children: 3   Years of education: Not on file   Highest education level: Some college, no degree  Occupational History   Occupation: Art gallery manager strain: Not on file   Food insecurity:    Worry: Not on file    Inability: Not on file   Transportation needs:    Medical: Not on file    Non-medical: Not on file  Tobacco Use   Smoking status: Former Smoker    Packs/day: 1.00    Years: 7.00    Pack years: 7.00    Last attempt to quit: 01/25/1971    Years since quitting: 47.2   Smokeless tobacco: Never Used  Substance and Sexual Activity   Alcohol use: Yes    Alcohol/week: 14.0 standard drinks    Types: 14 Standard drinks or equivalent per week    Comment: two scotches per day with water   Drug use: No   Sexual activity: Not on file  Lifestyle   Physical activity:    Days per week: Not on file    Minutes per session: Not on file   Stress: Not on file  Relationships   Social connections:    Talks on phone: Not on file    Gets together: Not on file    Attends religious service: Not on file    Active member of club or organization: Not on file    Attends meetings of clubs or organizations: Not on file    Relationship status: Not on file   Intimate partner violence:    Fear of current or ex partner: Not on file    Emotionally abused: Not on file    Physically abused: Not on file    Forced sexual activity: Not on file  Other Topics Concern   Not on file  Social History Narrative   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, historic buildings Situation: lives with husband      Spiritual Beliefs: Jewish      Lifestyle: active, diet is good    Family History  Problem Relation Age of Onset   Coronary artery disease Mother    Coronary artery disease Father    Cancer - Ovarian Sister    Colon cancer Cousin        x 2   Esophageal cancer Neg Hx    Rectal cancer Neg Hx    Stomach cancer Neg Hx     Past Medical History:  Diagnosis Date   Alcohol use    Anemia    Anxiety    Asthma    Basal cell carcinoma    forehead   Breast cancer ([place]) 1998   left    Colon polyps    GERD (gastroesophageal reflux disease)    Glaucoma    Hyperlipidemia  Hypertension    Hyponatremia    remote, mild   Mitral valve prolapse    Osteoarthritis    sees rheumatologist   Osteoporosis    sees rheumatologist   Personal history of radiation therapy 1998   PMR (polymyalgia rheumatica) (HCC)    sees rheumatologist   Pneumonia    Prinzmetal angina ([place])    White matter changes    ? ischemic sm vs per pt    Past Surgical History:  Procedure Laterality Date   ABDOMINAL HYSTERECTOMY     BREAST LUMPECTOMY Left 1998   cancer   CESAREAN SECTION     x 4   COLONOSCOPY  07/2012   in [place]  2005   abdominal hernia repair   HYSTEROTOMY     LYMPH NODE DISSECTION Left    POLYPECTOMY      Current Outpatient Medications  Medication Sig Dispense Refill   Alirocumab (PRALUENT) 75 MG/ML SOPN Inject 1 pen into the skin every 14 (fourteen) days. 2 pen 11   amLODipine (NORVASC) 5 MG tablet Take 1 tablet (5 mg total) by mouth daily. 90 tablet 3   aspirin 81 MG tablet Take 81 mg by mouth daily.     cetirizine (ZYRTEC) 10 MG tablet Take 10 mg by mouth daily.      Cyanocobalamin (B-12 PO) Take by mouth.     denosumab (PROLIA) 60 MG/ML SOLN injection Inject 60 mg into the skin every 6 (six) months. Administer in upper arm, thigh, or abdomen     Fluticasone Propionate (FLONASE NA) Place  1 spray into both nostrils daily.     furosemide (LASIX) 20 MG tablet TAKE 1/2 TABLET BY MOUTH 3 TIMES A WEEK 18 tablet 0   IRON PO Take 1 tablet by mouth daily.     LORazepam (ATIVAN) 1 MG tablet Take 1 mg by mouth at bedtime as needed (spasms).     MAGNESIUM PO Take 400 mg by mouth daily.      omeprazole (PRILOSEC) 20 MG capsule Take 20 mg by mouth daily.     pregabalin (LYRICA) 50 MG capsule Take 1 capsule (50 mg total) by mouth 3 (three) times daily. (Patient taking differently: Take 100 mg by mouth at bedtime. ) 270 capsule 3   valsartan (DIOVAN) 80 MG tablet TAKE 1 TABLET BY MOUTH EVERY DAY 90 tablet 0   No current facility-administered medications for this visit.     Allergies as of 04/24/2018 - Review Complete 04/23/2018  Allergen Reaction Noted   Tylenol [acetaminophen] Cough 07/15/2015   Turmeric  04/10/2017   Erythromycin  05/06/2016   Erythromycin base Other (See Comments)    Garlic Swelling 30/16/0109   Other  05/06/2016   Sulfa antibiotics Other (See Comments) 05/06/2016    Vitals: There were no vitals taken for this visit. Last Weight:  Wt Readings from Last 1 Encounters:  03/12/18 110 lb 1.6 oz (49.9 kg)   Last Height:   Ht Readings from Last 1 Encounters:  03/12/18 4' 8.5" (1.435 m)   Physical exam: Exam: Gen: NAD, conversant, well nourised, obese, well groomed                     CV: RRR, no MRG. No Carotid Bruits. No peripheral edema, warm, nontender Eyes: Conjunctivae clear without exudates or hemorrhage  Neuro: Detailed Neurologic Exam  Speech:    Speech is normal; fluent and spontaneous with normal comprehension.  Cognition:    The patient  is oriented to person, place, and time;     recent and remote memory intact;     language fluent;     normal attention, concentration,     fund of knowledge Cranial Nerves:    The pupils are equal, round, and reactive to light. The fundi are normal and spontaneous venous pulsations are present.  Visual fields are full to finger confrontation. Extraocular movements are intact. Trigeminal sensation is impaired on the left, the muscles of mastication are normal. Mild nasolabial flattening on the left but symmetric on smile may be normal variant. Bilateral L>R ptosis. The palate elevates in the midline. Hearing intact o voice. Voice is normal. Shoulder shrug is normal. The tongue has normal motion without fasciculations.   Coordination:    Normal finger to nose and heel to shin.   Gait:    Heel-toe and tandem gait are normal.   Motor Observation:    No asymmetry, no atrophy, and no involuntary movements noted. Tone:    Normal muscle tone.    Posture:    Posture is normal. normal erect    Strength: Very difficult motor exam, poor effort and giveway possibly arthritic, no focal weakness but generalized 4-4+     Sensation: intact to LT     Reflex Exam:  DTR's:    Deep tendon reflexes in the upper and lower extremities are 2+ bilaterally.   Toes:    The toes are down equiv going bilaterally.   Clonus:    Clonus is absent.    Assessment/Plan:  This is an absolutely lovely patient with left-sided facial numbness, feelings of fullness in the ear, positional headache, extensive white matter changes on brain, multiple falls and ataxia.   MRI of the brain w/wo contrast: I reviewed MRI of the brain and MR with trigeminal protocol with patient which showed mild atrophy and mild chronic microvascular changes which are normal for age.  Discussed these in general, pathophysiology and the brain atrophy and these white matter changes can be normal and aging.  Patient continues to have worries about her short-term memory loss.  At this time we will refer her for formal neurocognitive testing.  Doing well on Lyrica will continue 100 mg at night and will add 50 mg in the morning. May also take 150mg  at bedtime and 50mg  twice during the day depending on neuropathic symptoms.  Follow Up  Instructions:    I discussed the assessment and treatment plan with the patient. The patient was provided an opportunity to ask questions and all were answered. The patient agreed with the plan and demonstrated an understanding of the instructions.   The patient was advised to call back or seek an in-person evaluation if the symptoms worsen or if the condition fails to improve as anticipated.  I provided 40 minutes of not-in-person  time during this encounter.  Orders Placed This Encounter  Procedures   Ambulatory referral to Neuropsychology    Meds ordered this encounter  Medications   pregabalin (LYRICA) 50 MG capsule    Sig: Take 100mg  to 150mg  at bedtime. Take 50mg  in the morning. May add another 50mg  dose later in the afternoon if needed.    Dispense:  150 capsule    Refill:  4    A total of 40 minutes was spent face-to-face with this patient on web ex(not in person). Over half this time was spent on counseling patient on the  1. Trigeminal neuralgia   2. Short-term memory loss  diagnosis and different diagnostic and therapeutic options, counseling and coordination of care, risks ans benefits of management, compliance, or risk factor reduction and education.      Amanda Ill, MD  [facility] [address] [place], [place] [address]  Phone [phone] Fax [phone]

## 2018-05-01 DIAGNOSIS — M810 Age-related osteoporosis without current pathological fracture: Secondary | ICD-10-CM | POA: Diagnosis not present

## 2018-05-01 DIAGNOSIS — Z79899 Other long term (current) drug therapy: Secondary | ICD-10-CM | POA: Diagnosis not present

## 2018-05-08 DIAGNOSIS — M810 Age-related osteoporosis without current pathological fracture: Secondary | ICD-10-CM | POA: Diagnosis not present

## 2018-05-09 ENCOUNTER — Other Ambulatory Visit: Payer: Self-pay | Admitting: Family Medicine

## 2018-05-09 ENCOUNTER — Telehealth: Payer: Self-pay | Admitting: Family Medicine

## 2018-05-09 MED ORDER — VALSARTAN 160 MG PO TABS: 80.0000 mg | ORAL_TABLET | Freq: Every day | ORAL | 1 refills | Status: DC

## 2018-05-09 NOTE — Telephone Encounter (Signed)
I called [facility] and spoke with the pharmacy tech Margarita Grizzle.  Margarita Grizzle stated the 80mg  is on back order.  I called the pt and she asked if the Rx for 160mg  to take 1/2 tablet could be sent to [facility].  Message sent to Dr Ethlyn Gallery as Dr Maudie Mercury is out of the office today.

## 2018-05-09 NOTE — Telephone Encounter (Signed)
Sent to [facility]

## 2018-05-09 NOTE — Telephone Encounter (Signed)
Pt called l/m she said she can't get valsartan 80mg  from [facility] , however she can get the 160mg  so she cut them in half. [facility] would [facility].

## 2018-05-10 NOTE — Telephone Encounter (Signed)
Noted  

## 2018-05-12 ENCOUNTER — Other Ambulatory Visit: Payer: Self-pay | Admitting: Family Medicine

## 2018-05-14 ENCOUNTER — Encounter: Payer: Self-pay | Admitting: Family Medicine

## 2018-05-14 DIAGNOSIS — I10 Essential (primary) hypertension: Secondary | ICD-10-CM | POA: Diagnosis not present

## 2018-05-14 DIAGNOSIS — I129 Hypertensive chronic kidney disease with stage 1 through stage 4 chronic kidney disease, or unspecified chronic kidney disease: Secondary | ICD-10-CM | POA: Diagnosis not present

## 2018-05-14 DIAGNOSIS — N182 Chronic kidney disease, stage 2 (mild): Secondary | ICD-10-CM | POA: Diagnosis not present

## 2018-05-15 ENCOUNTER — Other Ambulatory Visit: Payer: Self-pay | Admitting: Nephrology

## 2018-05-15 DIAGNOSIS — N182 Chronic kidney disease, stage 2 (mild): Secondary | ICD-10-CM

## 2018-05-19 NOTE — Progress Notes (Signed)
It was a televisit ob webex , I updated the note appropriately. Thank you for catching that!!

## 2018-05-21 ENCOUNTER — Ambulatory Visit
Admission: RE | Admit: 2018-05-21 | Discharge: 2018-05-21 | Disposition: A | Discharge status: Home / Self Care | Payer: Medicare Other | Source: Ambulatory Visit | Attending: Nephrology | Admitting: Nephrology

## 2018-05-21 ENCOUNTER — Other Ambulatory Visit: Payer: Self-pay

## 2018-05-21 DIAGNOSIS — N182 Chronic kidney disease, stage 2 (mild): Secondary | ICD-10-CM

## 2018-05-25 DIAGNOSIS — C44319 Basal cell carcinoma of skin of other parts of face: Secondary | ICD-10-CM | POA: Diagnosis not present

## 2018-05-25 DIAGNOSIS — L814 Other melanin hyperpigmentation: Secondary | ICD-10-CM | POA: Diagnosis not present

## 2018-05-25 DIAGNOSIS — L821 Other seborrheic keratosis: Secondary | ICD-10-CM | POA: Diagnosis not present

## 2018-05-25 DIAGNOSIS — D1801 Hemangioma of skin and subcutaneous tissue: Secondary | ICD-10-CM | POA: Diagnosis not present

## 2018-05-25 DIAGNOSIS — D225 Melanocytic nevi of trunk: Secondary | ICD-10-CM | POA: Diagnosis not present

## 2018-05-25 DIAGNOSIS — L570 Actinic keratosis: Secondary | ICD-10-CM | POA: Diagnosis not present

## 2018-05-28 DIAGNOSIS — M2550 Pain in unspecified joint: Secondary | ICD-10-CM | POA: Diagnosis not present

## 2018-05-28 DIAGNOSIS — M810 Age-related osteoporosis without current pathological fracture: Secondary | ICD-10-CM | POA: Diagnosis not present

## 2018-05-28 DIAGNOSIS — M150 Primary generalized (osteo)arthritis: Secondary | ICD-10-CM | POA: Diagnosis not present

## 2018-05-28 DIAGNOSIS — M5030 Other cervical disc degeneration, unspecified cervical region: Secondary | ICD-10-CM | POA: Diagnosis not present

## 2018-06-05 DIAGNOSIS — Z85828 Personal history of other malignant neoplasm of skin: Secondary | ICD-10-CM | POA: Diagnosis not present

## 2018-06-05 DIAGNOSIS — C44319 Basal cell carcinoma of skin of other parts of face: Secondary | ICD-10-CM | POA: Diagnosis not present

## 2018-06-11 ENCOUNTER — Other Ambulatory Visit: Payer: Self-pay | Admitting: Oncology

## 2018-06-11 DIAGNOSIS — Z1231 Encounter for screening mammogram for malignant neoplasm of breast: Secondary | ICD-10-CM

## 2018-06-26 DIAGNOSIS — F4322 Adjustment disorder with anxiety: Secondary | ICD-10-CM | POA: Diagnosis not present

## 2018-07-17 DIAGNOSIS — H903 Sensorineural hearing loss, bilateral: Secondary | ICD-10-CM | POA: Diagnosis not present

## 2018-07-17 DIAGNOSIS — R42 Dizziness and giddiness: Secondary | ICD-10-CM | POA: Diagnosis not present

## 2018-07-17 DIAGNOSIS — H9193 Unspecified hearing loss, bilateral: Secondary | ICD-10-CM | POA: Diagnosis not present

## 2018-07-17 DIAGNOSIS — H9313 Tinnitus, bilateral: Secondary | ICD-10-CM | POA: Diagnosis not present

## 2018-07-17 DIAGNOSIS — H6983 Other specified disorders of Eustachian tube, bilateral: Secondary | ICD-10-CM | POA: Diagnosis not present

## 2018-07-17 HISTORY — DX: Dizziness and giddiness: R42

## 2018-07-31 ENCOUNTER — Ambulatory Visit
Admission: RE | Admit: 2018-07-31 | Discharge: 2018-07-31 | Disposition: A | Discharge status: Home / Self Care | Payer: Medicare Other | Source: Ambulatory Visit | Attending: Oncology | Admitting: Oncology

## 2018-07-31 ENCOUNTER — Other Ambulatory Visit: Payer: Self-pay

## 2018-07-31 DIAGNOSIS — Z1231 Encounter for screening mammogram for malignant neoplasm of breast: Secondary | ICD-10-CM

## 2018-08-02 ENCOUNTER — Ambulatory Visit: Payer: Medicare Other | Admitting: Allergy & Immunology

## 2018-08-09 DIAGNOSIS — N182 Chronic kidney disease, stage 2 (mild): Secondary | ICD-10-CM | POA: Diagnosis not present

## 2018-08-09 DIAGNOSIS — M1990 Unspecified osteoarthritis, unspecified site: Secondary | ICD-10-CM | POA: Diagnosis not present

## 2018-08-13 DIAGNOSIS — H40053 Ocular hypertension, bilateral: Secondary | ICD-10-CM | POA: Diagnosis not present

## 2018-08-16 DIAGNOSIS — I129 Hypertensive chronic kidney disease with stage 1 through stage 4 chronic kidney disease, or unspecified chronic kidney disease: Secondary | ICD-10-CM | POA: Diagnosis not present

## 2018-08-16 DIAGNOSIS — E871 Hypo-osmolality and hyponatremia: Secondary | ICD-10-CM | POA: Diagnosis not present

## 2018-08-16 DIAGNOSIS — N182 Chronic kidney disease, stage 2 (mild): Secondary | ICD-10-CM | POA: Diagnosis not present

## 2018-08-19 ENCOUNTER — Other Ambulatory Visit: Payer: Self-pay | Admitting: Family Medicine

## 2018-08-21 DIAGNOSIS — Z170 Estrogen receptor positive status [ER+]: Secondary | ICD-10-CM | POA: Diagnosis not present

## 2018-08-21 DIAGNOSIS — Z90721 Acquired absence of ovaries, unilateral: Secondary | ICD-10-CM | POA: Diagnosis not present

## 2018-08-21 DIAGNOSIS — Z1239 Encounter for other screening for malignant neoplasm of breast: Secondary | ICD-10-CM | POA: Diagnosis not present

## 2018-08-21 DIAGNOSIS — Z853 Personal history of malignant neoplasm of breast: Secondary | ICD-10-CM | POA: Diagnosis not present

## 2018-08-21 DIAGNOSIS — M810 Age-related osteoporosis without current pathological fracture: Secondary | ICD-10-CM | POA: Diagnosis not present

## 2018-08-21 DIAGNOSIS — Z01419 Encounter for gynecological examination (general) (routine) without abnormal findings: Secondary | ICD-10-CM | POA: Diagnosis not present

## 2018-08-21 DIAGNOSIS — Z90710 Acquired absence of both cervix and uterus: Secondary | ICD-10-CM | POA: Diagnosis not present

## 2018-08-21 DIAGNOSIS — C50512 Malignant neoplasm of lower-outer quadrant of left female breast: Secondary | ICD-10-CM | POA: Diagnosis not present

## 2018-08-28 ENCOUNTER — Other Ambulatory Visit: Payer: Self-pay

## 2018-08-28 ENCOUNTER — Encounter: Payer: Self-pay | Admitting: Allergy & Immunology

## 2018-08-28 ENCOUNTER — Ambulatory Visit (INDEPENDENT_AMBULATORY_CARE_PROVIDER_SITE_OTHER): Payer: Medicare Other | Admitting: Allergy & Immunology

## 2018-08-28 VITALS — BP 142/84 | HR 83 | Resp 16 | Ht <= 58 in | Wt 108.8 lb

## 2018-08-28 DIAGNOSIS — R05 Cough: Secondary | ICD-10-CM | POA: Diagnosis not present

## 2018-08-28 DIAGNOSIS — K219 Gastro-esophageal reflux disease without esophagitis: Secondary | ICD-10-CM

## 2018-08-28 DIAGNOSIS — J310 Chronic rhinitis: Secondary | ICD-10-CM | POA: Diagnosis not present

## 2018-08-28 DIAGNOSIS — R059 Cough, unspecified: Secondary | ICD-10-CM

## 2018-08-28 MED ORDER — PANTOPRAZOLE SODIUM 20 MG PO TBEC: 20.0000 mg | DELAYED_RELEASE_TABLET | Freq: Every day | ORAL | 5 refills | Status: DC

## 2018-08-28 MED ORDER — FAMOTIDINE 40 MG PO TABS: 40.0000 mg | ORAL_TABLET | Freq: Every day | ORAL | 5 refills | Status: DC

## 2018-08-28 MED ORDER — IPRATROPIUM BROMIDE 0.06 % NA SOLN: 2.0000 | Freq: Three times a day (TID) | NASAL | 5 refills | Status: DC

## 2018-08-28 NOTE — Progress Notes (Signed)
NEW PATIENT  Date of Service/Encounter:  08/28/18  Referring provider: Lucretia Kern, DO   Assessment:   Cough  Chronic rhinitis   Gastroesophageal reflux disease  Plan/Recommendations:   1. Cough - Lung testing looks great today. - We are not going to treat the postnasal drip first and see if this helps at all.  - If there is no improvement at the next visit with the cough, we might start an asthma medication.   2. Chronic rhinitis - We are going to get some blood work to look for environmental allergens. - We will call you in 1-2 weeks with the results of the testing.  - In the meantime, we are going to try to treat your rhinitis more aggressively with ipratropium one spray per nostril up to three times daily. - This can cause some over drying, so be careful. - Continue with the cetirizine and feel free to increase to one tablet twice daily.  3. Gastroesophageal reflux disease - Stop the omeprazole and start pantoprazole 40mg  in the morning. - Add on famotidine 40mg  at night.    4. Return in about 6 weeks (around 10/09/2018). This can be an in-person, a virtual Webex or a telephone follow up visit.    Subjective:   Amanda Cannon is a 73 y.o. female presenting today for evaluation of  Chief Complaint  Patient presents with  . Asthma    coughing at night, waking her up, not using her albuterol because she does not have anymore  . Allergy Testing    not sure if she wants allergy testing  . Allergic Rhinitis     coughing more, post nasal drip    Jerolyn Flenniken has a history of the following: Patient Active Problem List   Diagnosis Date Noted  . Trigeminal neuropathy 11/13/2017  . PMR (polymyalgia rheumatica) (HCC) 04/24/2017  . Hyponatremia 07/15/2015  . Hyperlipidemia 07/15/2015  . Esophageal spasm 07/15/2015  . Essential hypertension 07/15/2015  . Depression 07/15/2015  . Asthma 07/15/2015  . Tinnitus 07/15/2015  . Osteoporosis 07/15/2015  .  Spasm   . Prinzmetal angina ([place])   . Mitral valve prolapse   . White matter changes     History obtained from: chart review and patient.  Adine Heimann was referred by Lucretia Kern, DO.     Doreen is a 73 y.o. female presenting for an evaluation of a cough. She tells me that she coughs on a daily basis. This is a dry cough and can occur anytime during the year. She has tried a number of treatments for this cough. When she lived in [place], she was told take two cetirizine daily. Since moving to [place], she has had a problem where the left side of her face becomes "really bad" as if there is water in the ear and she is unable to hear. She went to see a Neurologist and she was diagnosed with trigeminal neuralgia. She was placed on Lyrica without improvement. Symptoms improve only with Advil.   She has seen Dr. Valetta Mole with ENT. She is on the Flonase at night and cetirizine in the day, which does not seem to be doing what it used to. She has not tried any other nose sprays; she started the Flonase two months ago. She is using it every night before she does to bed.   She did have some testing in the past when she lived in [place]. She has some adverse reactions to a handful of medications. She  was never on allergy shots.   She does have GERD and has been on a multitude of medications. She does not think that any of them work at all.    Otherwise, there is no history of other atopic diseases, including drug allergies, stinging insect allergies, eczema, urticaria or contact dermatitis. There is no significant infectious history. Vaccinations are up to date.    Past Medical History: Patient Active Problem List   Diagnosis Date Noted  . Trigeminal neuropathy 11/13/2017  . PMR (polymyalgia rheumatica) (HCC) 04/24/2017  . Hyponatremia 07/15/2015  . Hyperlipidemia 07/15/2015  . Esophageal spasm 07/15/2015  . Essential hypertension 07/15/2015  . Depression 07/15/2015  .  Asthma 07/15/2015  . Tinnitus 07/15/2015  . Osteoporosis 07/15/2015  . Spasm   . Prinzmetal angina ([place])   . Mitral valve prolapse   . White matter changes     Medication List:  Allergies as of 08/28/2018      Reactions   Tylenol [acetaminophen] Cough   Turmeric    Lowers BP   Erythromycin    Stomach cramps   Erythromycin Base Other (See Comments)   Stomach issues   Garlic Swelling   GI upset   Other    Beta-blockers-patient states it makes her crazy   Sulfa Antibiotics Other (See Comments)   Doesn't recall      Medication List       Accurate as of August 28, 2018  6:13 PM. If you have any questions, ask your nurse or doctor.        STOP taking these medications   omeprazole 20 MG capsule Commonly known as: PRILOSEC Stopped by: Valentina Shaggy, MD   pregabalin 50 MG capsule Commonly known as: LYRICA Stopped by: Valentina Shaggy, MD     TAKE these medications   Alirocumab 75 MG/ML Sopn Commonly known as: Praluent Inject 1 pen into the skin every 14 (fourteen) days.   amLODipine 5 MG tablet Commonly known as: NORVASC TAKE 1 TABLET BY MOUTH EVERY DAY   aspirin 81 MG tablet Take 81 mg by mouth daily.   B-12 PO Take by mouth.   cetirizine 10 MG tablet Commonly known as: ZYRTEC Take 10 mg by mouth daily.   Clinpro 5000 1.1 % Pste Generic drug: Sodium Fluoride See admin instructions.   denosumab 60 MG/ML Soln injection Commonly known as: PROLIA Inject 60 mg into the skin every 6 (six) months. Administer in upper arm, thigh, or abdomen   famotidine 40 MG tablet Commonly known as: PEPCID Take 1 tablet (40 mg total) by mouth daily. Started by: Valentina Shaggy, MD   [facility] NA Place 1 spray into both nostrils daily.   furosemide 20 MG tablet Commonly known as: LASIX TAKE 1/2 TABLET BY MOUTH 3 TIMES A WEEK What changed: See the new instructions.   ipratropium 0.06 % nasal spray Commonly known as: ATROVENT Place 2 sprays into both  nostrils 3 (three) times daily. Started by: Valentina Shaggy, MD   IRON PO Take 1 tablet by mouth daily.   LORazepam 1 MG tablet Commonly known as: ATIVAN Take 1 mg by mouth at bedtime as needed (spasms).   MAGNESIUM PO Take 400 mg by mouth daily.   pantoprazole 20 MG tablet Commonly known as: PROTONIX Take 1 tablet (20 mg total) by mouth daily. Started by: Valentina Shaggy, MD   triamcinolone cream 0.1 % Commonly known as: KENALOG triamcinolone acetonide 0.1 % topical cream   valsartan 160 MG tablet Commonly  known as: Diovan Take 0.5 tablets (80 mg total) by mouth daily.       Birth History: non-contributory  Developmental History: non-contributory  Past Surgical History: Past Surgical History:  Procedure Laterality Date  . ABDOMINAL HYSTERECTOMY    . BREAST LUMPECTOMY Left 1998   cancer  . CESAREAN SECTION     x 4  . COLONOSCOPY  07/2012   in [place]  . HERNIA REPAIR  2005   abdominal hernia repair  . HYSTEROTOMY    . LYMPH NODE DISSECTION Left   . POLYPECTOMY       Family History: Family History  Problem Relation Age of Onset  . Coronary artery disease Mother   . Coronary artery disease Father   . Cancer - Ovarian Sister   . Colon cancer Cousin        x 2  . Esophageal cancer Neg Hx   . Rectal cancer Neg Hx   . Stomach cancer Neg Hx      Social History: Dailynn lives at home with her husband.  She lives in a house that is 106 years old.  There is wood in the main living areas and carpeting in the bedroom.  They have a combination of gas and electric heating.  They have central cooling.  There is a cat inside of the home.  There are no dust mite covers on the bedding.  There is no tobacco exposure.  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   Review of Systems  Constitutional: Negative.  Negative for chills, fever, malaise/fatigue and weight loss.  HENT: Negative.  Negative for congestion, ear  discharge, ear pain, sinus pain and sore throat.   Eyes: Negative for pain, discharge and redness.  Respiratory: Negative for cough, sputum production, shortness of breath and wheezing.   Cardiovascular: Negative.  Negative for chest pain and palpitations.  Gastrointestinal: Negative for abdominal pain, heartburn, nausea and vomiting.  Skin: Negative.  Negative for itching and rash.  Neurological: Negative for dizziness and headaches.  Endo/Heme/Allergies: Negative for environmental allergies. Does not bruise/bleed easily.       Objective:   Blood pressure (!) 142/84, pulse 83, resp. rate 16, height 4' 8.1" (1.425 m), weight 108 lb 12.8 oz (49.4 kg), SpO2 96 %. Body mass index is 24.3 kg/m.   Physical Exam:   Physical Exam  Constitutional: She appears well-developed.  Very pleasant female. Talkative and happy-go-lucky personality.  HENT:  Head: Normocephalic and atraumatic.  Right Ear: Tympanic membrane, external ear and ear canal normal. No drainage, swelling or tenderness. Tympanic membrane is not injected, not scarred, not erythematous, not retracted and not bulging.  Left Ear: Tympanic membrane, external ear and ear canal normal. No drainage, swelling or tenderness. Tympanic membrane is not injected, not scarred, not erythematous, not retracted and not bulging.  Nose: Mucosal edema and rhinorrhea present. No nasal deformity or septal deviation. No epistaxis. Right sinus exhibits no maxillary sinus tenderness and no frontal sinus tenderness. Left sinus exhibits no maxillary sinus tenderness and no frontal sinus tenderness.  Mouth/Throat: Uvula is midline and oropharynx is clear and moist. Mucous membranes are not pale and not dry.  Eyes: Pupils are equal, round, and reactive to light. Conjunctivae and EOM are normal. Right eye exhibits no chemosis and no discharge. Left eye exhibits no chemosis and no discharge. Right conjunctiva is not injected. Left conjunctiva is not injected.   Cardiovascular: Normal rate, regular rhythm and normal heart sounds.  Respiratory: Effort normal and breath sounds normal. No accessory muscle usage. No tachypnea. No respiratory distress. She has no wheezes. She has no rhonchi. She has no rales. She exhibits no tenderness.  Moving air well in all lung fields.   GI: There is no abdominal tenderness. There is no rebound and no guarding.  Lymphadenopathy:       Head (right side): No submandibular, no tonsillar and no occipital adenopathy present.       Head (left side): No submandibular, no tonsillar and no occipital adenopathy present.    She has no cervical adenopathy.  Neurological: She is alert.  Skin: No abrasion, no petechiae and no rash noted. Rash is not papular, not vesicular and not urticarial. No erythema. No pallor.  No eczematous or urticarial lesions noted.   Psychiatric: She has a normal mood and affect.     Diagnostic studies:    Spirometry: results normal (FEV1: 1.23/85%, FVC: 1.83/93%, FEV1/FVC: 67%).    Spirometry consistent with mild obstructive disease.   Allergy Studies: none       Salvatore Marvel, MD [facility]

## 2018-08-28 NOTE — Patient Instructions (Addendum)
1. Cough - Lung testing looks great today. - We are not going to treat the postnasal drip first and see if this helps at all.  - If there is no improvement at the next visit with the cough, we might start an asthma medication.   2. Chronic rhinitis - We are going to get some blood work to look for environmental allergens. - We will call you in 1-2 weeks with the results of the testing.  - In the meantime, we are going to try to treat your rhinitis more aggressively with ipratropium one spray per nostril up to three times daily. - This can cause some over drying, so be careful. - Continue with the cetirizine and feel free to increase to one tablet twice daily.  3. Gastroesophageal reflux disease - Stop the omeprazole and start pantoprazole 40mg  in the morning. - Add on famotidine 40mg  at night.    4. Return in about 6 weeks (around 10/09/2018). This can be an in-person, a virtual Webex or a telephone follow up visit.   Please inform us of any Emergency Department visits, hospitalizations, or changes in symptoms. Call us before going to the ED for breathing or allergy symptoms since we might be able to fit you in for a sick visit. Feel free to contact us anytime with any questions, problems, or concerns.  It was a pleasure to see you again today!  Websites that have reliable patient information: 1. American Academy of Asthma, Allergy, and Immunology: www.aaaai.org 2. Food Allergy Research and Education (FARE): foodallergy.org 3. Mothers of Asthmatics: http://www.asthmacommunitynetwork.org 4. American College of Allergy, Asthma, and Immunology: www.acaai.org  "Like" [place] on Facebook and Instagram for our latest updates!      Make sure you are registered to vote! If you have moved or changed any of your contact information, you will need to get this updated before voting!  In some cases, you MAY be able to register to vote online:  [email or web address]    Voter ID laws are NOT going into effect for the General Election in November 2020! DO NOT let this stop you from exercising your right to vote!   Absentee voting is the SAFEST way to vote during the coronavirus pandemic!   Download and print an absentee ballot request form at [email or web address] or you can scan the QR code below with your smart phone:      More information on absentee ballots can be found here: [email or web address]

## 2018-08-31 LAB — IGE+ALLERGENS ZONE 2(30)
Alternaria Alternata IgE: <0.1 kU/L
Amer Sycamore IgE Qn: <0.1 kU/L
Aspergillus Fumigatus IgE: 0.24 kU/L — AB
Bahia Grass IgE: <0.1 kU/L
Bermuda Grass IgE: <0.1 kU/L
Cat Dander IgE: <0.1 kU/L
Cedar, Mountain IgE: <0.1 kU/L
Cladosporium Herbarum IgE: <0.1 kU/L
Cockroach, American IgE: <0.1 kU/L
Common Silver Birch IgE: <0.1 kU/L
D Farinae IgE: <0.1 kU/L
D Pteronyssinus IgE: <0.1 kU/L
Dog Dander IgE: <0.1 kU/L
Elm, American IgE: <0.1 kU/L
Hickory, White IgE: <0.1 kU/L
IgE (Immunoglobulin E), Serum: 77 IU/mL (ref 6–495)
Johnson Grass IgE: <0.1 kU/L
Maple/Box Elder IgE: <0.1 kU/L
Mucor Racemosus IgE: <0.1 kU/L
Mugwort IgE Qn: <0.1 kU/L
Nettle IgE: <0.1 kU/L
Oak, White IgE: <0.1 kU/L
Penicillium Chrysogen IgE: 0.17 kU/L — AB
Pigweed, Rough IgE: <0.1 kU/L
Plantain, English IgE: <0.1 kU/L
Ragweed, Short IgE: <0.1 kU/L
Sheep Sorrel IgE Qn: <0.1 kU/L
Stemphylium Herbarum IgE: <0.1 kU/L
Sweet gum IgE RAST Ql: <0.1 kU/L
Timothy Grass IgE: <0.1 kU/L
White Mulberry IgE: <0.1 kU/L

## 2018-08-31 LAB — ALLERGEN PROFILE, MOLD
Aureobasidi Pullulans IgE: <0.1 kU/L
Candida Albicans IgE: <0.1 kU/L
M009-IgE Fusarium proliferatum: <0.1 kU/L
M014-IgE Epicoccum purpur: <0.1 kU/L
Phoma Betae IgE: <0.1 kU/L
Setomelanomma Rostrat: <0.1 kU/L

## 2018-09-09 NOTE — Progress Notes (Signed)
Cardiology Office Note:    Date:  09/12/2018   ID:  Amanda Cannon, DOB Dec 05, 1945, MRN [id number]  PCP:  Lucretia Kern, DO  Cardiologist:  No primary care provider on file.   Referring MD: Lucretia Kern, DO   Chief Complaint  Patient presents with  . Chest Pain    ? Prinzmetal  . Mitral Valve Prolapse    History of Present Illness:    Amanda Cannon is a 73 y.o. female with a hx of hypertension, .polymyalgia rheumatica. hyperlipidemia, Prinzmetal's Angina, and remote history of mitral valve prolapse.  I need to is very loquacious and somewhat tangential.  She complains of a general sense of dizziness frequently.  She wonders if this is cardiac related.  There is no associated palpitation.  She feels her blood pressure fluctuates significantly.  It can be very high.  Today it is low normal.  The blood pressure below is confirmed by a separate recording performed by me.  She denies chest pain, orthopnea, PND, ankle swelling, claudication, syncope, and new transient or persistent neurological deficit.  Past Medical History:  Diagnosis Date  . Alcohol use   . Anemia   . Anxiety   . Asthma   . Basal cell carcinoma    forehead  . Breast cancer ([place]) 1998   left   . Colon polyps   . GERD (gastroesophageal reflux disease)   . Glaucoma   . Hyperlipidemia   . Hypertension   . Hyponatremia    remote, mild  . Mitral valve prolapse   . Osteoarthritis    sees rheumatologist  . Osteoporosis    sees rheumatologist  . Personal history of radiation therapy 1998  . PMR (polymyalgia rheumatica) (HCC)    sees rheumatologist  . Pneumonia   . Prinzmetal angina ([place])   . White matter changes    ? ischemic sm vs per pt    Past Surgical History:  Procedure Laterality Date  . ABDOMINAL HYSTERECTOMY    . BREAST LUMPECTOMY Left 1998   cancer  . CESAREAN SECTION     x 4  . COLONOSCOPY  07/2012   in [place]  . HERNIA REPAIR  2005   abdominal hernia repair  . HYSTEROTOMY    .  LYMPH NODE DISSECTION Left   . POLYPECTOMY      Current Medications: Current Meds  Medication Sig  . Alirocumab (PRALUENT) 75 MG/ML SOPN Inject 1 pen into the skin every 14 (fourteen) days.  Marland Kitchen amLODipine (NORVASC) 5 MG tablet TAKE 1 TABLET BY MOUTH EVERY DAY  . aspirin 81 MG tablet Take 81 mg by mouth daily.  . cetirizine (ZYRTEC) 10 MG tablet Take 10 mg by mouth daily.   Marland Kitchen CLINPRO 5000 1.1 % PSTE See admin instructions.  . Cyanocobalamin (B-12 PO) Take by mouth.  . denosumab (PROLIA) 60 MG/ML SOLN injection Inject 60 mg into the skin every 6 (six) months. Administer in upper arm, thigh, or abdomen  . famotidine (PEPCID) 40 MG tablet Take 1 tablet (40 mg total) by mouth daily.  . furosemide (LASIX) 20 MG tablet Take 20 mg by mouth daily.  Marland Kitchen ipratropium (ATROVENT) 0.06 % nasal spray Place 2 sprays into both nostrils 3 (three) times daily.  . IRON PO Take 1 tablet by mouth daily.  Marland Kitchen LORazepam (ATIVAN) 1 MG tablet Take 1 mg by mouth at bedtime as needed (spasms).  Marland Kitchen MAGNESIUM PO Take 400 mg by mouth daily.   . pantoprazole (PROTONIX) 20 MG tablet  Take 1 tablet (20 mg total) by mouth daily.  . valsartan (DIOVAN) 160 MG tablet Take 0.5 tablets (80 mg total) by mouth daily.     Allergies:   Tylenol [acetaminophen], Turmeric, Erythromycin, Erythromycin base, Garlic, Other, and Sulfa antibiotics   Social History   Socioeconomic History  . Marital status: Married    Spouse name: Not on file  . Number of children: 3  . Years of education: Not on file  . Highest education level: Some college, no degree  Occupational History  . Occupation: Psychologist, occupational  Social Needs  . Financial resource strain: Not on file  . Food insecurity    Worry: Not on file    Inability: Not on file  . Transportation needs    Medical: Not on file    Non-medical: Not on file  Tobacco Use  . Smoking status: Former Smoker    Packs/day: 1.00    Years: 7.00    Pack years: 7.00    Quit date: 01/25/1971    Years  since quitting: 47.6  . Smokeless tobacco: Never Used  Substance and Sexual Activity  . Alcohol use: Yes    Alcohol/week: 14.0 standard drinks    Types: 14 Standard drinks or equivalent per week    Comment: two scotches per day with water  . Drug use: No  . Sexual activity: Not on file  Lifestyle  . Physical activity    Days per week: Not on file    Minutes per session: Not on file  . Stress: Not on file  Relationships  . Social Herbalist on phone: Not on file    Gets together: Not on file    Attends religious service: Not on file    Active member of club or organization: Not on file    Attends meetings of clubs or organizations: Not on file    Relationship status: Not on file  Other Topics Concern  . 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 There is no history of Esophageal cancer, Rectal cancer, or Stomach cancer.  ROS:   Please see the history of present illness.    She has decreased hearing in the left ear.  Her left face feels numb.  She has a history of trigeminal neuralgia.  She follows with a neurologist for this problem.  She has a problem with hypo-natremia and is followed by Dr. Posey Pronto.  Most recent laboratory data was reviewed and sodium noted to be 128.  She is on furosemide daily and states that this is the treatment for her specific hyponatremia problem.  All other systems reviewed and are negative.  EKGs/Labs/Other Studies Reviewed:    The following studies were reviewed today: No new cardiac or functional data.  EKG:  EKG demonstrates normal sinus rhythm with nonspecific ST abnormality and mild  flattening of T waves.  Recent Labs: 11/08/2017: ALT 16 11/15/2017: Hemoglobin 13.3; Platelets 291.0 03/12/2018: BUN 14; Creatinine, Ser 0.86; Potassium 4.6; Sodium 130  Recent Lipid Panel    Component Value Date/Time   CHOL 185 11/08/2017 0744  TRIG 137 11/08/2017 0744   TRIG 174 (H) 06/24/2016 0848   HDL 88 11/08/2017 0744   HDL 74 06/24/2016 0848   CHOLHDL 2.1 11/08/2017 0744   LDLCALC 70 11/08/2017 0744    Physical Exam:    VS:  BP 116/62   Pulse 89   Ht 4' 8.1" (1.425 m)   Wt 106 lb 12.8 oz (48.4 kg)   SpO2 100%   BMI 23.86 kg/m     Wt Readings from Last 3 Encounters:  09/12/18 106 lb 12.8 oz (48.4 kg)  08/28/18 108 lb 12.8 oz (49.4 kg)  03/12/18 110 lb 1.6 oz (49.9 kg)     GEN: Appears well and younger than stated age.. No acute distress HEENT: Normal NECK: No JVD. LYMPHATICS: No lymphadenopathy CARDIAC:  RRR without murmur, gallop, or edema. VASCULAR:  Normal Pulses. No bruits. RESPIRATORY:  Clear to auscultation without rales, wheezing or rhonchi  ABDOMEN: Soft, non-tender, non-distended, No pulsatile mass, MUSCULOSKELETAL: No deformity  SKIN: Warm and dry NEUROLOGIC:  Alert and oriented x 3 PSYCHIATRIC:  Normal affect   ASSESSMENT:    1. Mitral valve prolapse   2. Prinzmetal angina ([place])   3. Other hyperlipidemia   4. Essential hypertension   5. Educated About Covid-19 Virus Infection    PLAN:    In order of problems listed above:  1. No clinical evidence of mitral valve prolapse. 2. No episodes of angina though she owns the diagnosis of Prinzmetal's angina.  No recent nitroglycerin use. 3. She is on evolocumab for lipid lowering.  Unable to tolerate statin therapy.  Followed in our lipid clinic.  Lipid panel will be obtained today. 4. Because of relatively low blood pressure, and complaint of orthostatic dizziness we will decrease amlodipine to 5 mg Monday Wednesday and Friday.  She will monitor blood pressure to make sure that she has not  consistently above 140/80 mmHg.  If this occurs we will need to go back to the current dose.  The thought here being that slightly higher blood pressures may improve the orthostatic dizziness given that she is on furosemide, an angiotensin receptor blocker,  and amlodipine.  Overall education and awareness concerning primary/secondary risk prevention was discussed in detail: LDL less than 70, hemoglobin A1c less than 7, blood pressure target less than 130/80 mmHg, >150 minutes of moderate aerobic activity per week, avoidance of smoking, weight control (via diet and exercise), and continued surveillance/management of/for obstructive sleep apnea.  Greater than 50% of the time during this office visit was spent in education, counseling, and coordination of care related to underlying disease process and testing as outlined.   Medication Adjustments/Labs and Tests Ordered: Current medicines are reviewed at length with the patient today.  Concerns regarding medicines are outlined above.  No orders of the defined types were placed in this encounter.  No orders of the defined types were placed in this encounter.   There are no Patient Instructions on file for this visit.   Signed, Sinclair Grooms, MD  09/12/2018 9:20 AM    [place]

## 2018-09-10 ENCOUNTER — Telehealth: Payer: Self-pay | Admitting: Interventional Cardiology

## 2018-09-10 ENCOUNTER — Ambulatory Visit: Payer: Medicare Other | Admitting: Family Medicine

## 2018-09-10 ENCOUNTER — Encounter: Payer: Medicare Other | Admitting: Family Medicine

## 2018-09-10 NOTE — Telephone Encounter (Signed)
New Message   Patient states that she is supposed to get a cholesterol check done in October. There are no active requests on her chart for any blood work to be completed. Please assist and give patient a call back to confirm.

## 2018-09-10 NOTE — Telephone Encounter (Signed)
Spoke with pt and made her aware that we will plan to do labs at her appt on Wednesday and to make sure she comes fasting.  Pt verbalized understanding.

## 2018-09-12 ENCOUNTER — Other Ambulatory Visit: Payer: Self-pay

## 2018-09-12 ENCOUNTER — Encounter: Payer: Self-pay | Admitting: Interventional Cardiology

## 2018-09-12 ENCOUNTER — Ambulatory Visit (INDEPENDENT_AMBULATORY_CARE_PROVIDER_SITE_OTHER): Payer: Medicare Other | Admitting: Interventional Cardiology

## 2018-09-12 VITALS — BP 116/62 | HR 89 | Ht <= 58 in | Wt 106.8 lb

## 2018-09-12 DIAGNOSIS — I201 Angina pectoris with documented spasm: Secondary | ICD-10-CM

## 2018-09-12 DIAGNOSIS — E7849 Other hyperlipidemia: Secondary | ICD-10-CM | POA: Diagnosis not present

## 2018-09-12 DIAGNOSIS — I10 Essential (primary) hypertension: Secondary | ICD-10-CM | POA: Diagnosis not present

## 2018-09-12 DIAGNOSIS — I341 Nonrheumatic mitral (valve) prolapse: Secondary | ICD-10-CM | POA: Diagnosis not present

## 2018-09-12 DIAGNOSIS — Z7189 Other specified counseling: Secondary | ICD-10-CM

## 2018-09-12 LAB — HEPATIC FUNCTION PANEL
ALT: 21 IU/L (ref 0–32)
AST: 29 IU/L (ref 0–40)
Albumin: 5.3 g/dL — ABNORMAL HIGH (ref 3.7–4.7)
Alkaline Phosphatase: 75 IU/L (ref 39–117)
Bilirubin Total: 0.5 mg/dL (ref 0.0–1.2)
Bilirubin, Direct: 0.18 mg/dL (ref 0.00–0.40)
Total Protein: 8 g/dL (ref 6.0–8.5)

## 2018-09-12 LAB — CBC
Hematocrit: 39.7 % (ref 34.0–46.6)
Hemoglobin: 13.7 g/dL (ref 11.1–15.9)
MCH: 32.5 pg (ref 26.6–33.0)
MCHC: 34.5 g/dL (ref 31.5–35.7)
MCV: 94 fL (ref 79–97)
Platelets: 299 10*3/uL (ref 150–450)
RBC: 4.22 x10E6/uL (ref 3.77–5.28)
RDW: 12 % (ref 11.7–15.4)
WBC: 6.1 10*3/uL (ref 3.4–10.8)

## 2018-09-12 LAB — LIPID PANEL
Chol/HDL Ratio: 2.1 ratio (ref 0.0–4.4)
Cholesterol, Total: 216 mg/dL — ABNORMAL HIGH (ref 100–199)
HDL: 103 mg/dL (ref >39)
LDL Calculated: 86 mg/dL (ref 0–99)
Triglycerides: 137 mg/dL (ref 0–149)
VLDL Cholesterol Cal: 27 mg/dL (ref 5–40)

## 2018-09-12 MED ORDER — AMLODIPINE BESYLATE 5 MG PO TABS: 5.0000 mg | ORAL_TABLET | ORAL | 2 refills | Status: DC

## 2018-09-12 NOTE — Patient Instructions (Signed)
Medication Instructions:  1) DECREASE Amlodipine to 5mg  once daily on Monday, Wednesday and Friday  If you need a refill on your cardiac medications before your next appointment, please call your pharmacy.   Lab work: Liver, Lipid and CBC today  If you have labs (blood work) drawn today and your tests are completely normal, you will receive your results only by: Marland Kitchen MyChart Message (if you have MyChart) OR . A paper copy in the mail If you have any lab test that is abnormal or we need to change your treatment, we will call you to review the results.  Testing/Procedures: None  Follow-Up: At [facility], you and your health needs are our priority.  As part of our continuing mission to provide you with exceptional heart care, we have created designated Provider Care Teams.  These Care Teams include your primary Cardiologist (physician) and Advanced Practice Providers (APPs -  Physician Assistants and Nurse Practitioners) who all work together to provide you with the care you need, when you need it. You will need a follow up appointment in 12 months.  Please call our office 2 months in advance to schedule this appointment.  You may see Dr. Tamala Julian or one of the following Advanced Practice Providers on your designated Care Team:   Truitt Merle, NP Cecilie Kicks, NP . Kathyrn Drown, NP  Any Other Special Instructions Will Be Listed Below (If Applicable).  Please monitor your blood pressure daily about 2 hours after your morning medications.  Contact the office by phone or MyChart with those readings in 2-3 weeks.  Your blood pressure goal is 140/80 or less.

## 2018-09-12 NOTE — Addendum Note (Signed)
Addended by: BOWERS, JENNIFER L on: 09/12/2018 03:50 PM   Modules accepted: Orders  

## 2018-09-14 ENCOUNTER — Other Ambulatory Visit: Payer: Self-pay | Admitting: Family Medicine

## 2018-09-24 DIAGNOSIS — F4322 Adjustment disorder with anxiety: Secondary | ICD-10-CM | POA: Diagnosis not present

## 2018-10-09 ENCOUNTER — Ambulatory Visit (INDEPENDENT_AMBULATORY_CARE_PROVIDER_SITE_OTHER): Payer: Medicare Other | Admitting: Allergy & Immunology

## 2018-10-09 ENCOUNTER — Other Ambulatory Visit: Payer: Self-pay

## 2018-10-09 ENCOUNTER — Encounter: Payer: Self-pay | Admitting: Allergy & Immunology

## 2018-10-09 VITALS — BP 118/68 | HR 86 | Temp 97.9°F | Resp 16

## 2018-10-09 DIAGNOSIS — R05 Cough: Secondary | ICD-10-CM | POA: Diagnosis not present

## 2018-10-09 DIAGNOSIS — J310 Chronic rhinitis: Secondary | ICD-10-CM | POA: Diagnosis not present

## 2018-10-09 DIAGNOSIS — R42 Dizziness and giddiness: Secondary | ICD-10-CM | POA: Diagnosis not present

## 2018-10-09 DIAGNOSIS — I201 Angina pectoris with documented spasm: Secondary | ICD-10-CM

## 2018-10-09 DIAGNOSIS — K219 Gastro-esophageal reflux disease without esophagitis: Secondary | ICD-10-CM

## 2018-10-09 DIAGNOSIS — R059 Cough, unspecified: Secondary | ICD-10-CM

## 2018-10-09 MED ORDER — FAMOTIDINE 40 MG PO TABS: 40.0000 mg | ORAL_TABLET | Freq: Every day | ORAL | 5 refills | Status: DC

## 2018-10-09 NOTE — Progress Notes (Signed)
FOLLOW UP  Date of Service/Encounter:  10/09/18   Assessment:   Cough  Chronic rhinitis   Gastroesophageal reflux disease  Vertigo   Amanda Cannon presents for follow-up visit.  She is doing better on the more aggressive reflux control.  However, she continues to have some postnasal drip.  The ipratropium evidently caused hypotension, which is rather bizarre.  Regardless, she thinks that if she increases her Flonase to twice a day in addition to the cetirizine twice a day, her symptoms would be better controlled.  I am somewhat worried about her "choking on water".  I wonder if she needs a swallow study, but I will bring that up the next visit.  She is endorsing some dizziness today as well which has been ongoing.  We are going to refer her to Dr. Deeann Saint clinic since they recently hired the vertigo specialist.  She has seen Dr. Wilburn Cornelia in the past, but does not want to go back to see him.  Plan/Recommendations:   1. Cough - We are still going to work on working on your postnasal drip before we start thinking about asthma medications.  2. Chronic rhinitis - with testing positive only to molds on blood testing - You an use Flonase twice daily to see if this helps with the postnasal drip.  - Continue with cetrizine 10mg  twice daily.   3. Gastroesophageal reflux disease - Continue with pantoprazole 40mg  in the morning. - Continue with famotidine 40mg  at night.  - We will not make medication changes at this time.    4. Return in about 4 months (around 02/08/2019). This can be an in-person, a virtual Webex or a telephone follow up visit.   Subjective:   Amanda Cannon is a 73 y.o. female presenting today for follow up of  Chief Complaint  Patient presents with  . Cough    cough is better. sneezing has begun. she feels like she may be coming down with something, feels warm to the touch. almost like she has a persistent cold. she tells me that the cough seems to have moved  to mainly during the day now.     Amanda Cannon has a history of the following: Patient Active Problem List   Diagnosis Date Noted  . Trigeminal neuropathy 11/13/2017  . PMR (polymyalgia rheumatica) (HCC) 04/24/2017  . Hyponatremia 07/15/2015  . Hyperlipidemia 07/15/2015  . Esophageal spasm 07/15/2015  . Essential hypertension 07/15/2015  . Depression 07/15/2015  . Asthma 07/15/2015  . Tinnitus 07/15/2015  . Osteoporosis 07/15/2015  . Spasm   . Prinzmetal angina ([place])   . Mitral valve prolapse   . White matter changes     History obtained from: chart review and patient.  Amanda Cannon is a 73 y.o. female presenting for a follow up visit.  She was last seen as a new patient in August 2020.  At that time, her lung testing looked good.  We did not start any asthma medications because we wanted to address her postnasal drip.  For her rhinitis, we sent an environmental allergy panel which was negative.  We started her on ipratropium 1 spray per nostril up to 3 times daily.  We also continue with cetirizine and gave her the option of increasing that.  For her GERD, we stopped omeprazole and started pantoprazole 40 mg in the morning and we added on famotidine 40 mg at night.  Since the last visit, she has had better control of her reflux during the day. She reports that  she cannot drink water because it "comes right back up". She knows that this is reflux. She does have a GI doctor but she does not think that he can do anything anyway. She is not excited about seeing him again since there is "nothing else they can do".   She has been doing the fluticasone at night. She is taking one cetirizine BID. She reports that the ipratropium decreased her blood pressure. She stopped it and her blood pressure improved. She does have the drip during the day, but it is much better at night. She thinks that it might be related to being cooler at night.   Otherwise, there have been no changes to her past medical  history, surgical history, family history, or social history.    Review of Systems  Constitutional: Negative.  Negative for chills, fever, malaise/fatigue and weight loss.  HENT: Negative.  Negative for congestion, ear discharge and ear pain.   Eyes: Negative for pain, discharge and redness.  Respiratory: Negative for cough, sputum production, shortness of breath and wheezing.   Cardiovascular: Negative.  Negative for chest pain and palpitations.  Gastrointestinal: Positive for abdominal pain (baseline GERD) and nausea (baseline GERD). Negative for constipation, diarrhea, heartburn and vomiting.  Skin: Negative.  Negative for itching and rash.  Neurological: Negative for dizziness and headaches.  Endo/Heme/Allergies: Negative for environmental allergies. Does not bruise/bleed easily.       Objective:   Blood pressure 118/68, pulse 86, temperature 97.9 F (36.6 C), temperature source Temporal, resp. rate 16, SpO2 98 %. There is no height or weight on file to calculate BMI.   Physical Exam:  Physical Exam  Constitutional: She appears well-developed.  Very talkative female.  HENT:  Head: Normocephalic and atraumatic.  Right Ear: Tympanic membrane, external ear and ear canal normal.  Left Ear: Tympanic membrane, external ear and ear canal normal.  Nose: Mucosal edema and rhinorrhea present. No nasal deformity or septal deviation. No epistaxis. Right sinus exhibits no maxillary sinus tenderness and no frontal sinus tenderness. Left sinus exhibits no maxillary sinus tenderness and no frontal sinus tenderness.  Mouth/Throat: Uvula is midline and oropharynx is clear and moist. Mucous membranes are not pale and not dry.  Eyes: Pupils are equal, round, and reactive to light. Conjunctivae and EOM are normal. Right eye exhibits no chemosis and no discharge. Left eye exhibits no chemosis and no discharge. Right conjunctiva is not injected. Left conjunctiva is not injected.  Cardiovascular:  Normal rate, regular rhythm and normal heart sounds.  Respiratory: Effort normal and breath sounds normal. No accessory muscle usage. No tachypnea. No respiratory distress. She has no wheezes. She has no rhonchi. She has no rales. She exhibits no tenderness.  She does have some upper airway "wheezing", but nothing deeper in her lungs.  Lymphadenopathy:    She has no cervical adenopathy.  Neurological: She is alert.  Skin: No abrasion, no petechiae and no rash noted. Rash is not papular, not vesicular and not urticarial. No erythema. No pallor.  Psychiatric: She has a normal mood and affect.     Diagnostic studies: none      Salvatore Marvel, MD  [facility] [place]

## 2018-10-09 NOTE — Patient Instructions (Addendum)
1. Cough - We are still going to work on working on your postnasal drip before we start thinking about asthma medications.  2. Chronic rhinitis - with testing positive only to molds on blood testing - You an use Flonase twice daily to see if this helps with the postnasal drip.  - Continue with cetrizine 10mg  twice daily.   3. Gastroesophageal reflux disease - Continue with pantoprazole 40mg  in the morning. - Continue with famotidine 40mg  at night.  - We will not make medication changes at this time.    4. Return in about 4 months (around 02/08/2019). This can be an in-person, a virtual Webex or a telephone follow up visit.   Please inform us of any Emergency Department visits, hospitalizations, or changes in symptoms. Call us before going to the ED for breathing or allergy symptoms since we might be able to fit you in for a sick visit. Feel free to contact us anytime with any questions, problems, or concerns.  It was a pleasure to see you again today!  Websites that have reliable patient information: 1. American Academy of Asthma, Allergy, and Immunology: www.aaaai.org 2. Food Allergy Research and Education (FARE): foodallergy.org 3. Mothers of Asthmatics: http://www.asthmacommunitynetwork.org 4. American College of Allergy, Asthma, and Immunology: www.acaai.org  "Like" [place] on Facebook and Instagram for our latest updates!      Make sure you are registered to vote! If you have moved or changed any of your contact information, you will need to get this updated before voting!  In some cases, you MAY be able to register to vote online: [email or web address]    Voter ID laws are NOT going into effect for the General Election in November 2020! DO NOT let this stop you from exercising your right to vote!   Absentee voting is the SAFEST way to vote during the coronavirus pandemic!   Download and print an absentee ballot request form at  [email or web address]-Request or you can scan the QR code below with your smart phone:      More information on absentee ballots can be found here: [email or web address]

## 2018-10-11 ENCOUNTER — Ambulatory Visit (INDEPENDENT_AMBULATORY_CARE_PROVIDER_SITE_OTHER): Payer: Medicare Other

## 2018-10-11 ENCOUNTER — Other Ambulatory Visit: Payer: Self-pay

## 2018-10-11 DIAGNOSIS — Z23 Encounter for immunization: Secondary | ICD-10-CM

## 2018-10-12 ENCOUNTER — Telehealth: Payer: Self-pay

## 2018-10-12 NOTE — Telephone Encounter (Signed)
-----   Message from Valentina Shaggy, MD sent at 10/09/2018  2:42 PM EDT ----- ENT referral placed. Dr. Benjamine Mola recently hired a vertigo specialist I believe.

## 2018-10-12 NOTE — Telephone Encounter (Signed)
Patients referral has been faxed to Dr Velvet Bathe office for the Vertigo Specialist.   Thanks

## 2018-10-22 ENCOUNTER — Other Ambulatory Visit: Payer: Self-pay | Admitting: Allergy & Immunology

## 2018-10-30 ENCOUNTER — Other Ambulatory Visit: Payer: Self-pay | Admitting: Family Medicine

## 2018-11-01 DIAGNOSIS — M2550 Pain in unspecified joint: Secondary | ICD-10-CM | POA: Diagnosis not present

## 2018-11-03 ENCOUNTER — Other Ambulatory Visit: Payer: Self-pay | Admitting: Family Medicine

## 2018-11-08 DIAGNOSIS — M810 Age-related osteoporosis without current pathological fracture: Secondary | ICD-10-CM | POA: Diagnosis not present

## 2018-11-13 DIAGNOSIS — C50512 Malignant neoplasm of lower-outer quadrant of left female breast: Secondary | ICD-10-CM | POA: Diagnosis not present

## 2018-11-13 DIAGNOSIS — E871 Hypo-osmolality and hyponatremia: Secondary | ICD-10-CM | POA: Diagnosis not present

## 2018-11-13 DIAGNOSIS — N644 Mastodynia: Secondary | ICD-10-CM | POA: Diagnosis not present

## 2018-11-13 DIAGNOSIS — M810 Age-related osteoporosis without current pathological fracture: Secondary | ICD-10-CM | POA: Diagnosis not present

## 2018-11-13 DIAGNOSIS — Z08 Encounter for follow-up examination after completed treatment for malignant neoplasm: Secondary | ICD-10-CM | POA: Diagnosis not present

## 2018-11-13 DIAGNOSIS — Z170 Estrogen receptor positive status [ER+]: Secondary | ICD-10-CM | POA: Diagnosis not present

## 2018-11-13 DIAGNOSIS — Z853 Personal history of malignant neoplasm of breast: Secondary | ICD-10-CM | POA: Diagnosis not present

## 2018-11-13 DIAGNOSIS — Z79899 Other long term (current) drug therapy: Secondary | ICD-10-CM | POA: Diagnosis not present

## 2018-11-13 NOTE — Telephone Encounter (Signed)
FYI  I called to follow up on the patients referral again and  I was told the patient didn't think she needed to be seen at this time so they have closed the referral.  Thanks

## 2018-11-14 ENCOUNTER — Telehealth: Payer: Self-pay | Admitting: Pharmacist

## 2018-11-14 NOTE — Telephone Encounter (Addendum)
Pt called clinic to schedule f/u lipid panel and to discuss PASS application for Praluent coverage next year. She just received a 3 month supply of Praluent 75mg . Will mail out new application for [id number] year and will apply for higher 150mg  strength to target LDL goal < 70.

## 2018-11-16 ENCOUNTER — Other Ambulatory Visit: Payer: Self-pay | Admitting: *Deleted

## 2018-11-16 MED ORDER — PANTOPRAZOLE SODIUM 20 MG PO TBEC: 20.0000 mg | DELAYED_RELEASE_TABLET | Freq: Every day | ORAL | 1 refills | Status: DC

## 2018-11-21 DIAGNOSIS — M19072 Primary osteoarthritis, left ankle and foot: Secondary | ICD-10-CM | POA: Diagnosis not present

## 2018-11-21 DIAGNOSIS — M13871 Other specified arthritis, right ankle and foot: Secondary | ICD-10-CM | POA: Diagnosis not present

## 2018-11-21 DIAGNOSIS — M47812 Spondylosis without myelopathy or radiculopathy, cervical region: Secondary | ICD-10-CM | POA: Diagnosis not present

## 2018-11-21 DIAGNOSIS — N644 Mastodynia: Secondary | ICD-10-CM | POA: Diagnosis not present

## 2018-11-21 DIAGNOSIS — M19071 Primary osteoarthritis, right ankle and foot: Secondary | ICD-10-CM | POA: Diagnosis not present

## 2018-11-21 DIAGNOSIS — M19031 Primary osteoarthritis, right wrist: Secondary | ICD-10-CM | POA: Diagnosis not present

## 2018-11-21 DIAGNOSIS — M13872 Other specified arthritis, left ankle and foot: Secondary | ICD-10-CM | POA: Diagnosis not present

## 2018-11-21 DIAGNOSIS — Z170 Estrogen receptor positive status [ER+]: Secondary | ICD-10-CM | POA: Diagnosis not present

## 2018-11-21 DIAGNOSIS — C50512 Malignant neoplasm of lower-outer quadrant of left female breast: Secondary | ICD-10-CM | POA: Diagnosis not present

## 2018-11-21 DIAGNOSIS — M19032 Primary osteoarthritis, left wrist: Secondary | ICD-10-CM | POA: Diagnosis not present

## 2018-11-26 NOTE — Telephone Encounter (Signed)
Pt dropped off PASS application, this has been faxed to [facility].

## 2018-11-28 ENCOUNTER — Other Ambulatory Visit: Payer: Self-pay

## 2018-11-28 ENCOUNTER — Encounter: Payer: Self-pay | Admitting: Family Medicine

## 2018-11-28 ENCOUNTER — Ambulatory Visit (INDEPENDENT_AMBULATORY_CARE_PROVIDER_SITE_OTHER): Payer: Medicare Other | Admitting: Family Medicine

## 2018-11-28 VITALS — BP 110/80 | HR 99 | Temp 97.7°F | Ht <= 58 in | Wt 107.3 lb

## 2018-11-28 DIAGNOSIS — M353 Polymyalgia rheumatica: Secondary | ICD-10-CM | POA: Diagnosis not present

## 2018-11-28 DIAGNOSIS — D649 Anemia, unspecified: Secondary | ICD-10-CM

## 2018-11-28 DIAGNOSIS — E538 Deficiency of other specified B group vitamins: Secondary | ICD-10-CM | POA: Diagnosis not present

## 2018-11-28 DIAGNOSIS — I10 Essential (primary) hypertension: Secondary | ICD-10-CM

## 2018-11-28 DIAGNOSIS — I341 Nonrheumatic mitral (valve) prolapse: Secondary | ICD-10-CM | POA: Diagnosis not present

## 2018-11-28 DIAGNOSIS — J4520 Mild intermittent asthma, uncomplicated: Secondary | ICD-10-CM

## 2018-11-28 DIAGNOSIS — M810 Age-related osteoporosis without current pathological fracture: Secondary | ICD-10-CM | POA: Diagnosis not present

## 2018-11-28 LAB — VITAMIN B12: Vitamin B-12: >1500 pg/mL — ABNORMAL HIGH (ref 211–911)

## 2018-11-28 LAB — IBC + FERRITIN
Ferritin: 286.7 ng/mL (ref 10.0–291.0)
Iron: 107 ug/dL (ref 42–145)
Saturation Ratios: 27.8 % (ref 20.0–50.0)
Transferrin: 275 mg/dL (ref 212.0–360.0)

## 2018-11-28 LAB — FOLATE: Folate: 11.4 ng/mL (ref >5.9)

## 2018-11-28 LAB — VITAMIN D 25 HYDROXY (VIT D DEFICIENCY, FRACTURES): VITD: 42.91 ng/mL (ref 30.00–100.00)

## 2018-11-28 NOTE — Progress Notes (Signed)
Amanda Cannon DOB: 1945/05/07 Encounter date: 11/28/2018  This isa 73 y.o. female who presents to establish care. Chief Complaint  Patient presents with  . Establish Care    History of present illness: Will be seeing rheumatologist next week. Started seeing rheum for prolia shots and now she is a Fish farm manager for prolia.   Thinks she has DDD of back. States she hasn't been diagnosed because sx aren't bad enough. She has had disc slip in past but trying to hold off on seeing specialist unless needed. Notes issue at night - feels that nerve is being pinched sometimes at night. Was taking aleve BID but not doing that now. Thinks weather also affects her.   Sneezing, coughing and feels like she gets one cold after another.   Has found out that after years of suffering with stomach aches that iron pill was killing her. Trying to follow higher iron diet.    Hypertension/mitral valve prolapse: follows with Dr. Tamala Julian  HL: folowing in lipid clinic; doesn't tolerate statins. On praluent. Doing well with this.   Chronic paresthesias: L sided upper face tingling - has had MRI, ENT, neurology. Now seeing Dr. Anastasia Pall. Medications for this made her feel dizzy.   Hx PMR/osteoporosis/arthritis: on prolia, seeing Dr. Amil Amen Chronic rhinitis/cough: Following with allergy. Dx as trigeminal neuralgia. Doesn't have pain but does have the numbness.   Hx Breast ca: -1998 -neg bracaper her report -s/p partial mastectomy L breast, hysterectomyand oophorectomy -after cancer, tamoxifen, LN dissection then they suggested ovaries to be removed.   Seasonal allergies:  Saw allergist. Coughing and post nasal is better.   Has had hyponatremia for years. Following with Dr. Posey Pronto. States that GFR has been decreasing. Can't drink a lot of water due to this. Usually runs in the high 120's/low 130's.   Past Medical History:  Diagnosis Date  . Alcohol use   . Anemia   . Anxiety   . Asthma   . Basal cell  carcinoma    forehead  . Breast cancer ([place]) 1998   left   . Colon polyps   . GERD (gastroesophageal reflux disease)   . Glaucoma   . Hyperlipidemia   . Hypertension   . Hyponatremia    remote, mild  . Mitral valve prolapse   . Osteoarthritis    sees rheumatologist  . Osteoporosis    sees rheumatologist  . Personal history of radiation therapy 1998  . PMR (polymyalgia rheumatica) (HCC)    sees rheumatologist  . Pneumonia   . Prinzmetal angina ([place])   . White matter changes    ? ischemic sm vs per pt   Past Surgical History:  Procedure Laterality Date  . ABDOMINAL HYSTERECTOMY    . BREAST LUMPECTOMY Left 1998   cancer  . CESAREAN SECTION     x 4  . COLONOSCOPY  07/2012   in [place]  . HERNIA REPAIR  2005   abdominal hernia repair  . HYSTEROTOMY    . LYMPH NODE DISSECTION Left   . POLYPECTOMY     Allergies  Allergen Reactions  . Tylenol [Acetaminophen] Cough  . Turmeric     Lowers BP  . Erythromycin     Stomach cramps  . Erythromycin Base Other (See Comments)    Stomach issues  . Garlic Swelling    GI upset  . Other     Beta-blockers-patient states it makes her crazy  . Sulfa Antibiotics Other (See Comments)    Doesn't recall   Current Meds  Medication Sig  . Alirocumab (PRALUENT) 75 MG/ML SOPN Inject 1 pen into the skin every 14 (fourteen) days.  Marland Kitchen aspirin 81 MG tablet Take 81 mg by mouth daily.  . cetirizine (ZYRTEC) 10 MG tablet Take 10 mg by mouth 2 (two) times daily.   Marland Kitchen CLINPRO 5000 1.1 % PSTE See admin instructions.  . Cyanocobalamin (B-12 PO) Take by mouth.  . denosumab (PROLIA) 60 MG/ML SOLN injection Inject 60 mg into the skin every 6 (six) months. Administer in upper arm, thigh, or abdomen  . Fluticasone Propionate (FLONASE NA)   . furosemide (LASIX) 20 MG tablet Take 20 mg by mouth daily.  Marland Kitchen LORazepam (ATIVAN) 1 MG tablet Take 1 mg by mouth at bedtime as needed (spasms).  Marland Kitchen MAGNESIUM PO Take 400 mg by mouth daily.   . pantoprazole (PROTONIX) 20  MG tablet Take 1 tablet (20 mg total) by mouth daily.  . valsartan (DIOVAN) 160 MG tablet TAKE 1/2 A TABLET BY MOUTH DAILY.  . [DISCONTINUED] amLODipine (NORVASC) 5 MG tablet TAKE 1 TABLET BY MOUTH EVERY DAY   Social History   Tobacco Use  . Smoking status: Former Smoker    Packs/day: 1.00    Years: 7.00    Pack years: 7.00    Quit date: 01/25/1971    Years since quitting: 47.8  . Smokeless tobacco: Never Used  Substance Use Topics  . Alcohol use: Yes    Alcohol/week: 14.0 standard drinks    Types: 14 Standard drinks or equivalent per week    Comment: two scotches per day with water   Family History  Problem Relation Age of Onset  . Coronary artery disease Mother 44  . Coronary artery disease Father 35  . Cancer - Ovarian Sister   . Colon cancer Cousin        x 2  . Esophageal cancer Neg Hx   . Rectal cancer Neg Hx   . Stomach cancer Neg Hx      Review of Systems  Constitutional: Negative for chills, fatigue and fever.  Respiratory: Negative for cough, chest tightness, shortness of breath and wheezing.   Cardiovascular: Negative for chest pain, palpitations and leg swelling.    Objective:  BP 110/80 (BP Location: Right Arm, Patient Position: Sitting, Cuff Size: Normal)   Pulse 99   Temp 97.7 F (36.5 C) (Temporal)   Ht 4\' 8"  (1.422 m)   Wt 107 lb 4.8 oz (48.7 kg)   SpO2 97%   BMI 24.06 kg/m   Weight: 107 lb 4.8 oz (48.7 kg)   BP Readings from Last 3 Encounters:  11/28/18 110/80  10/09/18 118/68  09/12/18 116/62   Wt Readings from Last 3 Encounters:  11/28/18 107 lb 4.8 oz (48.7 kg)  09/12/18 106 lb 12.8 oz (48.4 kg)  08/28/18 108 lb 12.8 oz (49.4 kg)    Physical Exam Constitutional:      General: She is not in acute distress.    Appearance: She is well-developed.  Cardiovascular:     Rate and Rhythm: Normal rate and regular rhythm.     Heart sounds: Normal heart sounds. No murmur. No friction rub.  Pulmonary:     Effort: Pulmonary effort is normal.  No respiratory distress.     Breath sounds: Normal breath sounds. No wheezing or rales.  Musculoskeletal:     Right lower leg: No edema.     Left lower leg: No edema.  Neurological:     Mental Status: She is alert and  oriented to person, place, and time.  Psychiatric:        Behavior: Behavior normal.     Assessment/Plan:  1. Essential hypertension Stable on current medications.  2. Mitral valve prolapse Follows with Dr. Tamala Julian.  Minimal.  3. Mild intermittent asthma without complication No active breathing issues.  She does follow with allergy.  4. Osteoporosis, unspecified osteoporosis type, unspecified pathological fracture presence On Prolia. - VITAMIN D 25 Hydroxy (Vit-D Deficiency, Fractures); Future - VITAMIN D 25 Hydroxy (Vit-D Deficiency, Fractures)  5. PMR (polymyalgia rheumatica) (HCC) Following with rheumatology, Dr. Amil Amen  6. Anemia, unspecified type In the past.  We will check iron levels as well as vitamin levels see if we can help with this at all.  May be related to his cramping. - IBC + Ferritin; Future - Vitamin B12; Future - Folate; Future - Folate - Vitamin B12 - IBC + Ferritin  7. B12 deficiency - Vitamin B12; Future - Folate; Future - Folate - Vitamin B12  Return in about 6 months (around 05/28/2019) for Chronic condition visit.  Micheline Rough, MD

## 2018-11-29 ENCOUNTER — Other Ambulatory Visit: Payer: Self-pay | Admitting: Family Medicine

## 2018-12-05 DIAGNOSIS — M2550 Pain in unspecified joint: Secondary | ICD-10-CM | POA: Diagnosis not present

## 2018-12-05 DIAGNOSIS — M5030 Other cervical disc degeneration, unspecified cervical region: Secondary | ICD-10-CM | POA: Diagnosis not present

## 2018-12-05 DIAGNOSIS — M25551 Pain in right hip: Secondary | ICD-10-CM | POA: Diagnosis not present

## 2018-12-05 DIAGNOSIS — M150 Primary generalized (osteo)arthritis: Secondary | ICD-10-CM | POA: Diagnosis not present

## 2018-12-05 DIAGNOSIS — M810 Age-related osteoporosis without current pathological fracture: Secondary | ICD-10-CM | POA: Diagnosis not present

## 2018-12-05 DIAGNOSIS — Z6822 Body mass index (BMI) 22.0-22.9, adult: Secondary | ICD-10-CM | POA: Diagnosis not present

## 2018-12-11 ENCOUNTER — Telehealth: Payer: Self-pay | Admitting: Pharmacist

## 2018-12-11 NOTE — Telephone Encounter (Signed)
Pt called clinic - states that PASS will only cover her Praluent for 2021 if she has spent $500 out of pocket first.  Advised pt we'll apply for [facility] instead since this isn't required. Will need higher 150mg  Praluent dose - prior auth approved through 11/26/19.  Pt states she still has 4 injections left of the 75mg  dose, will apply for grant once she uses up her current supply since they'll cover her for a 12 month period once approved.

## 2018-12-18 ENCOUNTER — Encounter: Payer: Self-pay | Admitting: Family Medicine

## 2018-12-24 DIAGNOSIS — F4322 Adjustment disorder with anxiety: Secondary | ICD-10-CM | POA: Diagnosis not present

## 2018-12-26 ENCOUNTER — Other Ambulatory Visit: Payer: Self-pay | Admitting: Family Medicine

## 2018-12-26 NOTE — Telephone Encounter (Signed)
Last OV 11/28/18 Last refill - historical provider  Next OV 05/29/19

## 2018-12-31 DIAGNOSIS — N182 Chronic kidney disease, stage 2 (mild): Secondary | ICD-10-CM | POA: Diagnosis not present

## 2019-01-09 ENCOUNTER — Encounter: Payer: Self-pay | Admitting: Family Medicine

## 2019-01-22 ENCOUNTER — Ambulatory Visit (INDEPENDENT_AMBULATORY_CARE_PROVIDER_SITE_OTHER): Payer: Medicare Other

## 2019-01-22 VITALS — BP 120/80 | HR 87 | Ht <= 58 in | Wt 103.0 lb

## 2019-01-22 DIAGNOSIS — Z0000 Encounter for general adult medical examination without abnormal findings: Secondary | ICD-10-CM | POA: Diagnosis not present

## 2019-01-22 NOTE — Progress Notes (Signed)
This visit is being conducted via phone call due to the COVID-19 pandemic. This patient has given me verbal consent via phone to conduct this visit, patient states they are participating from their home address. Some vital signs may be absent or patient reported.   Patient identification: identified by name, DOB, and current address.  Location provider: [facility], Office Persons participating in the virtual visit: Mrs. Trellis Moment and Franne Forts, LPN.   Subjective:   Amanda Cannon is a 73 y.o. female who presents for Medicare Annual (Subsequent) preventive examination.  Mrs. Isais is walking with her husband 3 days a week for about 20-30 minutes each time. She states she can't drink a lot of water due to having hyponatremia. She is eating 2 healthy meals daily. She has a strong desire to volunteer and to be sociable; the pandemic has made that very difficult. Otherwise, she is doing well.   Review of Systems:   Cardiac Risk Factors include: advanced age (>71mn, >>70women);sedentary lifestyle;hypertension     Objective:     Vitals: BP 120/80 Comment: patient reported  Pulse 87   Ht _0  (1.422 m)   Wt 103 lb (46.7 kg)   SpO2 97%   BMI 23.09 kg/m   Body mass index is 23.09 kg/m.  Advanced Directives 01/22/2019 11/09/2017 11/08/2016  Does Patient Have a Medical Advance Directive? Yes Yes No  Type of AParamedicof ANew AlluweLiving will - -  Does patient want to make changes to medical advance directive? No - Patient declined - -  Copy of [place] Chart? No - copy requested - -    Tobacco Social History   Tobacco Use  Smoking Status Former Smoker  . Packs/day: 1.00  . Years: 7.00  . Pack years: 7.00  . Quit date: 01/25/1971  . Years since quitting: 48.0  Smokeless Tobacco Never Used     Counseling given: Not Answered   Clinical Intake:  Pre-visit preparation completed: Yes  Pain : No/denies pain     BMI - recorded: 23.09 Nutritional Status: BMI of 19-24  Normal Nutritional Risks: None Diabetes: No  How often do you need to have someone help you when you read instructions, pamphlets, or other written materials from your doctor or pharmacy?: 1 - Never What is the last grade level you completed in school?: 1 year college  Interpreter Needed?: No  Information entered by :: CFranne Forts LPN.  Past Medical History:  Diagnosis Date  . Alcohol use   . Anemia   . Anxiety   . Asthma   . Basal cell carcinoma    forehead  . [place]   [place]   . Colon polyps   . GERD (gastroesophageal reflux disease)   . Glaucoma   . Hyperlipidemia   . Hypertension   . Hyponatremia    remote, mild  . Mitral valve prolapse   . Osteoarthritis    sees rheumatologist  . Osteoporosis    sees rheumatologist  . Personal history of radiation therapy 1998  . PMR (polymyalgia rheumatica) (HCC)    sees rheumatologist  . Pneumonia   . [place]   . White matter changes    ? ischemic sm vs per pt   Past Surgical History:  Procedure Laterality Date  . ABDOMINAL HYSTERECTOMY    . BREAST LUMPECTOMY Left 1998   cancer  . CESAREAN SECTION     x 4  . COLONOSCOPY  07/2012  in [place]  2005   abdominal hernia repair  . HYSTEROTOMY    . LYMPH NODE DISSECTION Left   . POLYPECTOMY     Family History  Problem Relation Age of Onset  . Coronary artery disease Mother 88  . Coronary artery disease Father 85  . Cancer - Ovarian Sister   . Colon cancer Cousin        x 2  . Esophageal cancer Neg Hx   . Rectal cancer Neg Hx   . Stomach cancer Neg Hx    Social History   Socioeconomic History  . Marital status: Married    Spouse name: Zenia Resides   . Number of children: 3  . Years of education: 41  . Highest education level: Some college, no degree  Occupational History  . Occupation: Psychologist, occupational  Tobacco Use  . Smoking status: Former Smoker     Packs/day: 1.00    Years: 7.00    Pack years: 7.00    Quit date: 01/25/1971    Years since quitting: 48.0  . Smokeless tobacco: Never Used  Substance and Sexual Activity  . Alcohol use: Yes    Alcohol/week: 14.0 standard drinks    Types: 14 Standard drinks or equivalent per week    Comment: two drinks with burbon + water daily   . Drug use: No  . Sexual activity: Not on file  Other Topics Concern  . 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 Difficulty of Paying Living Expenses: Not hard at all  Food Insecurity: No Food Insecurity  . Worried About Charity fundraiser in the Last Year: Never true  . Ran Out of Food in the Last Year: Never true  Transportation Needs: No Transportation Needs  . Lack of Transportation (Medical): No  . Lack of Transportation (Non-Medical): No  Physical Activity: Insufficiently Active  . Days of Exercise per Week: 3 days  . Minutes of Exercise per Session: 20 min  Stress:   . Feeling of Stress : Not on file  Social Connections: Not Isolated  . Frequency of Communication with Friends and Family: More than three times a week  . Frequency of Social Gatherings with Friends and Family: Once a week  . Attends Religious Services: More than 4 times per year  . Active Member of Clubs or Organizations: Yes  . Attends Archivist Meetings: More than 4 times per year  . Marital Status: Married    Outpatient Encounter Medications as of 01/22/2019  Medication Sig  . Alirocumab (PRALUENT) 75 MG/ML SOPN Inject 1 pen into the skin every 14 (fourteen) days.  Marland Kitchen amLODipine (NORVASC) 5 MG tablet TAKE 1 TABLET BY MOUTH EVERY DAY  . aspirin 81 MG tablet  Take 81 mg by mouth daily.  . cetirizine (ZYRTEC) 10 MG tablet Take 10 mg by mouth 2 (two) times daily.   Marland Kitchen CLINPRO 5000 1.1 % PSTE See admin instructions.  Marland Kitchen denosumab (PROLIA) 60 MG/ML SOLN injection Inject 60 mg into the skin every 6 (six) months. Administer in upper arm, thigh, or abdomen  .  LORazepam (ATIVAN) 1 MG tablet Take 1 mg by mouth at bedtime as needed (spasms).  Marland Kitchen MAGNESIUM PO Take 400 mg by mouth daily.   . pantoprazole (PROTONIX) 20 MG tablet Take 1 tablet (20 mg total) by mouth daily.  . valsartan (DIOVAN) 160 MG tablet TAKE 1/2 A TABLET BY MOUTH DAILY.  . famotidine (PEPCID) 40 MG tablet Take 1 tablet (40 mg total) by mouth daily.  . Fluticasone Propionate (FLONASE NA)   . furosemide (LASIX) 20 MG tablet TAKE 1/2 TABLET (10MG      TOTAL) 3 TIMES A WEEK  . [DISCONTINUED] Cyanocobalamin (B-12 PO) Take by mouth.   No facility-administered encounter medications on file as of 01/22/2019.    Activities of Daily Living In your present state of health, do you have any difficulty performing the following activities: 01/22/2019  Hearing? Y  Vision? N  Difficulty concentrating or making decisions? N  Walking or climbing stairs? Y  Comment problems with right hip  Dressing or bathing? N  Doing errands, shopping? N  Preparing Food and eating ? N  Using the Toilet? N  In the past six months, have you accidently leaked urine? N  Do you have problems with loss of bowel control? N  Managing your Medications? N  Managing your Finances? N  Housekeeping or managing your Housekeeping? N  Some recent data might be hidden    Patient Care Team: [name], MD as PCP - General (Family Medicine)    Assessment:   This is a routine wellness examination for Amanda Cannon.  Exercise Activities and Dietary recommendations Current Exercise Habits: The patient does not participate in regular exercise at present, Exercise limited by: None identified  Goals    . Patient Stated      Patient would like to make social connections. She is having a difficult time finding volunteer opportunities during the pandemic.       Fall Risk Fall Risk  01/22/2019 11/09/2017 05/15/2017 11/08/2016  Falls in the past year? 0 No No No  Comment - states she has had balancre issues; working on tai chi - -  Risk for fall due to : Medication side effect - - -  Follow up Falls evaluation completed;Education provided;Falls prevention discussed - - -   Is the patient's home free of loose throw rugs in walkways, pet beds, electrical cords, etc?   yes      Grab bars in the bathroom? yes      Handrails on the stairs?   no; no stairs      Adequate lighting?   yes  Timed Get Up and Go performed: N/A due to telephone visit  Depression Screen PHQ 2/9 Scores 01/22/2019 11/09/2017 05/15/2017 11/08/2016  PHQ - 2 Score 2 0 2 0  PHQ- 9 Score 5 - 9 -     Cognitive Function MMSE - Mini Mental State Exam 11/09/2017 11/08/2016  Not completed: (No Data) (No Data)     6CIT Screen 01/22/2019  What Year? 0 points  What month? 0 points  What time? 0 points  Count back from 20 0 points  Months in reverse 0 points  Repeat phrase 0 points  Total Score 0    Immunization History  Administered Date(s) Administered  . Fluad Quad(high Dose 65+) 10/11/2018  . Influenza, High Dose Seasonal PF 11/15/2016, 11/09/2017  . Influenza-Unspecified 10/25/2015  . Pneumococcal Conjugate-13 07/24/2013    Qualifies for Shingles Vaccine? Yes and she will check with pharmacy for her out of  pocket costs.   Screening Tests Health Maintenance  Topic Date Due  . TETANUS/TDAP  10/28/1963  . DEXA SCAN  01/23/2020  . MAMMOGRAM  07/30/2020  . COLONOSCOPY  09/28/2022  . INFLUENZA VACCINE  Completed  . Hepatitis C Screening  Completed  . PNA vac Low Risk Adult  Completed    Cancer Screenings: Lung: Low Dose CT Chest recommended if Age 49-80 years, 30 pack-year currently smoking OR have quit w/in 15years. Patient  does not qualify. Breast:  Up to date on Mammogram? Yes   Up to date of Bone Density/Dexa? Yes Colorectal: yes  Additional Screenings:  Hepatitis C Screening: completed 03/12/2018.      Plan:   Mrs. Bachmann declines hearing aids due to cost. In house referral will be made to community resources to attempt to help with this in some way. She also declines an updated Tdap because Medicare does not cover the cost of that. She will check with her pharmacy for out of pocket costs for Shingrix. She also intends to walk longer distances with her daughter who lives locally.   I have personally reviewed and noted the following in the patient's chart:   . Medical and social history . Use of alcohol, tobacco or illicit drugs  . Current medications and supplements . Functional ability and status . Nutritional status . Physical activity . Advanced directives . List of other physicians . Hospitalizations, surgeries, and ER visits in previous 12 months . Vitals . Screenings to include cognitive, depression, and falls . Referrals and appointments  In addition, I have reviewed and discussed with patient certain preventive protocols, quality metrics, and best practice recommendations. A written personalized care plan for preventive services as well as general preventive health recommendations were provided to patient.     Franne Forts, LPN  [phone]

## 2019-01-22 NOTE — Patient Instructions (Signed)
[name] , Thank you for taking time to participate in your Medicare Wellness Visit. I appreciate your ongoing commitment to your health goals. Please review the following plan we discussed and let me know if I can assist you in the future.   Screening recommendations/referrals: Colorectal Screening: colonoscopy was done 09/27/2017 and due again 09/27/2021. Up to date at this time. Mammogram: Completed on 07/31/2018. Due again after 07/31/2019. Up to date at this time. Bone Density: completed on 01/22/2018; currently on Prolia injections. Up to date at this time.  Vision and Dental Exams: Recommended annual ophthalmology exams for early detection of glaucoma and other disorders of the eye Recommended annual dental exams for proper oral hygiene. Patient has a yearly eye exam and sees the dentist twice a year.  Diabetic Exams: Diabetic Eye Exam: N/A Diabetic Foot Exam: N/A  Vaccinations: Influenza vaccine: completed on 10/11/2018. Due again in Fall 2021.  Pneumococcal vaccine: completed on 11/10/2015. Currently up to date. Tdap vaccine: no documentation on file. Patient declines updating at this time. Shingles vaccine: Please call your pharmacy to determine your out of pocket expense for the Shingrix vaccine. You may receive this vaccine at your local pharmacy.This is a series of two injections given 2-6 months apart.  Advanced directives: Advance directives discussed with you today. Please bring a copy of your POA (Power of [place]) and/or Living Will to your next appointment.  Goals: Recommend to exercise for at least 150 minutes per week.  Recommend to remove any items from the home that may cause slips or trips.   Next appointment: Please schedule your Annual Wellness Visit with your Nurse Health Advisor in one year.  Preventive Care 74 Years and Older, Female Preventive care refers to lifestyle choices and visits with your health care provider that can promote health and  wellness. What does preventive care include?  A yearly physical exam. This is also called an annual well check.  Dental exams once or twice a year.  Routine eye exams. Ask your health care provider how often you should have your eyes checked.  Personal lifestyle choices, including:  Daily care of your teeth and gums.  Regular physical activity.  Eating a healthy diet.  Avoiding tobacco and drug use.  Limiting alcohol use.  Practicing safe sex.  Taking low-dose aspirin every day if recommended by your health care provider.  Taking vitamin and mineral supplements as recommended by your health care provider. What happens during an annual well check? The services and screenings done by your health care provider during your annual well check will depend on your age, overall health, lifestyle risk factors, and family history of disease. Counseling  Your health care provider may ask you questions about your:  Alcohol use.  Tobacco use.  Drug use.  Emotional well-being.  Home and relationship well-being.  Sexual activity.  Eating habits.  History of falls.  Memory and ability to understand (cognition).  Work and work Statistician.  Reproductive health. Screening  You may have the following tests or measurements:  Height, weight, and BMI.  Blood pressure.  Lipid and cholesterol levels. These may be checked every 5 years, or more frequently if you are over 42 years old.  Skin check.  Lung cancer screening. You may have this screening every year starting at age 8 if you have a 30-pack-year history of smoking and currently smoke or have quit within the past 15 years.  Fecal occult blood test (FOBT) of the stool. You may have this test  every year starting at age 22.  Flexible sigmoidoscopy or colonoscopy. You may have a sigmoidoscopy every 5 years or a colonoscopy every 10 years starting at age 15.  Hepatitis C blood test.  Hepatitis B blood test.  Sexually  transmitted disease (STD) testing.  Diabetes screening. This is done by checking your blood sugar (glucose) after you have not eaten for a while (fasting). You may have this done every 1-3 years.  Bone density scan. This is done to screen for osteoporosis. You may have this done starting at age 35.  Mammogram. This may be done every 1-2 years. Talk to your health care provider about how often you should have regular mammograms. Talk with your health care provider about your test results, treatment options, and if necessary, the need for more tests. Vaccines  Your health care provider may recommend certain vaccines, such as:  Influenza vaccine. This is recommended every year.  Tetanus, diphtheria, and acellular pertussis (Tdap, Td) vaccine. You may need a Td booster every 10 years.  Zoster vaccine. You may need this after age 33.  Pneumococcal 13-valent conjugate (PCV13) vaccine. One dose is recommended after age 7.  Pneumococcal polysaccharide (PPSV23) vaccine. One dose is recommended after age 18. Talk to your health care provider about which screenings and vaccines you need and how often you need them. This information is not intended to replace advice given to you by your health care provider. Make sure you discuss any questions you have with your health care provider. Document Released: 02/06/2015 Document Revised: 09/30/2015 Document Reviewed: 11/11/2014 Elsevier Interactive Patient Education  2017 [place] Prevention in the Home Falls can cause injuries. They can happen to people of all ages. There are many things you can do to make your home safe and to help prevent falls. What can I do on the outside of my home?  Regularly fix the edges of walkways and driveways and fix any cracks.  Remove anything that might make you trip as you walk through a door, such as a raised step or threshold.  Trim any bushes or trees on the path to your home.  Use bright outdoor  lighting.  Clear any walking paths of anything that might make someone trip, such as rocks or tools.  Regularly check to see if handrails are loose or broken. Make sure that both sides of any steps have handrails.  Any raised decks and porches should have guardrails on the edges.  Have any leaves, snow, or ice cleared regularly.  Use sand or salt on walking paths during winter.  Clean up any spills in your garage right away. This includes oil or grease spills. What can I do in the bathroom?  Use night lights.  Install grab bars by the toilet and in the tub and shower. Do not use towel bars as grab bars.  Use non-skid mats or decals in the tub or shower.  If you need to sit down in the shower, use a plastic, non-slip stool.  Keep the floor dry. Clean up any water that spills on the floor as soon as it happens.  Remove soap buildup in the tub or shower regularly.  Attach bath mats securely with double-sided non-slip rug tape.  Do not have throw rugs and other things on the floor that can make you trip. What can I do in the bedroom?  Use night lights.  Make sure that you have a light by your bed that is easy to reach.  Do not use any sheets or blankets that are too big for your bed. They should not hang down onto the floor.  Have a firm chair that has side arms. You can use this for support while you get dressed.  Do not have throw rugs and other things on the floor that can make you trip. What can I do in the kitchen?  Clean up any spills right away.  Avoid walking on wet floors.  Keep items that you use a lot in easy-to-reach places.  If you need to reach something above you, use a strong step stool that has a grab bar.  Keep electrical cords out of the way.  Do not use floor polish or wax that makes floors slippery. If you must use wax, use non-skid floor wax.  Do not have throw rugs and other things on the floor that can make you trip. What can I do with my  stairs?  Do not leave any items on the stairs.  Make sure that there are handrails on both sides of the stairs and use them. Fix handrails that are broken or loose. Make sure that handrails are as long as the stairways.  Check any carpeting to make sure that it is firmly attached to the stairs. Fix any carpet that is loose or worn.  Avoid having throw rugs at the top or bottom of the stairs. If you do have throw rugs, attach them to the floor with carpet tape.  Make sure that you have a light switch at the top of the stairs and the bottom of the stairs. If you do not have them, ask someone to add them for you. What else can I do to help prevent falls?  Wear shoes that:  Do not have high heels.  Have rubber bottoms.  Are comfortable and fit you well.  Are closed at the toe. Do not wear sandals.  If you use a stepladder:  Make sure that it is fully opened. Do not climb a closed stepladder.  Make sure that both sides of the stepladder are locked into place.  Ask someone to hold it for you, if possible.  Clearly mark and make sure that you can see:  Any grab bars or handrails.  First and last steps.  Where the edge of each step is.  Use tools that help you move around (mobility aids) if they are needed. These include:  Canes.  Walkers.  Scooters.  Crutches.  Turn on the lights when you go into a dark area. Replace any light bulbs as soon as they burn out.  Set up your furniture so you have a clear path. Avoid moving your furniture around.  If any of your floors are uneven, fix them.  If there are any pets around you, be aware of where they are.  Review your medicines with your doctor. Some medicines can make you feel dizzy. This can increase your chance of falling. Ask your doctor what other things that you can do to help prevent falls. This information is not intended to replace advice given to you by your health care provider. Make sure you discuss any  questions you have with your health care provider. Document Released: 11/06/2008 Document Revised: 06/18/2015 Document Reviewed: 02/14/2014 Elsevier Interactive Patient Education  2017 [employer].

## 2019-01-23 ENCOUNTER — Telehealth: Payer: Self-pay

## 2019-01-23 NOTE — Telephone Encounter (Signed)
Copied from [place] [id number]. Topic: Referral - Status >> Jan 23, 2019  [id number] PM Simone Curia D wrote: [id number] Spoke with patient about resources for hearing aids.  Patient has already contacted [facility] and they only offer a small discount. Patient is not interested in obtaining hearing aids right now.  I gave her the number for [facility] which is funded by the [place] if she decides to call at a later date.[employer] is not offering refurbished hearing aids due to [employer].  Ambrose Mantle [phone]

## 2019-01-26 ENCOUNTER — Other Ambulatory Visit: Payer: Self-pay | Admitting: Family Medicine

## 2019-01-29 ENCOUNTER — Telehealth: Payer: Self-pay | Admitting: Pharmacist

## 2019-01-29 MED ORDER — PRALUENT 150 MG/ML ~~LOC~~ SOAJ: 1.0000 "pen " | SUBCUTANEOUS | 3 refills | Status: DC

## 2019-01-29 NOTE — Telephone Encounter (Signed)
Applied for [employer] for [employer] which has been approved for $2500. Rx sent to pharmacy for higher Praluent 150mg  strength to target LDL goal < 70. Fatima Sanger info provided to pharmacy. Pt is aware and was very appreciative. She will call with any concerns.

## 2019-02-08 ENCOUNTER — Ambulatory Visit: Payer: Medicare Other | Attending: Internal Medicine

## 2019-02-08 DIAGNOSIS — Z23 Encounter for immunization: Secondary | ICD-10-CM | POA: Diagnosis not present

## 2019-02-08 NOTE — Progress Notes (Signed)
   Covid-19 Vaccination Clinic  Name:  Akriti Reimold    MRN: [id number] DOB: 04-Aug-1945  02/08/2019  Ms. Zeno was observed post Covid-19 immunization for 15 minutes without incidence. She was provided with Vaccine Information Sheet and instruction to access the V-Safe system.   Ms. Oum was instructed to call 911 with any severe reactions post vaccine: Marland Kitchen Difficulty breathing  . Swelling of your face and throat  . A fast heartbeat  . A bad rash all over your body  . Dizziness and weakness    Immunizations Administered    Name Date Dose VIS Date Route   Pfizer COVID-19 Vaccine 02/08/2019 12:23 PM 0.3 mL 01/04/2019 Intramuscular   Manufacturer: [place]   Lot: [id number]   [place]: [id number]

## 2019-02-12 ENCOUNTER — Other Ambulatory Visit: Payer: Self-pay

## 2019-02-12 ENCOUNTER — Encounter: Payer: Self-pay | Admitting: Allergy & Immunology

## 2019-02-12 ENCOUNTER — Telehealth: Payer: Self-pay | Admitting: *Deleted

## 2019-02-12 ENCOUNTER — Ambulatory Visit (INDEPENDENT_AMBULATORY_CARE_PROVIDER_SITE_OTHER): Payer: Medicare Other | Admitting: Allergy & Immunology

## 2019-02-12 VITALS — BP 122/78 | HR 89 | Temp 98.2°F | Resp 16 | Ht <= 58 in | Wt 105.6 lb

## 2019-02-12 DIAGNOSIS — R42 Dizziness and giddiness: Secondary | ICD-10-CM

## 2019-02-12 DIAGNOSIS — R05 Cough: Secondary | ICD-10-CM | POA: Diagnosis not present

## 2019-02-12 DIAGNOSIS — J310 Chronic rhinitis: Secondary | ICD-10-CM | POA: Diagnosis not present

## 2019-02-12 DIAGNOSIS — R059 Cough, unspecified: Secondary | ICD-10-CM

## 2019-02-12 MED ORDER — FAMOTIDINE 40 MG PO TABS: 40.0000 mg | ORAL_TABLET | Freq: Every day | ORAL | 5 refills | Status: DC

## 2019-02-12 NOTE — Telephone Encounter (Signed)
An ambulatory ENT referral has been placed for the patient to see Dr. Benjamine Mola for Vertigo.

## 2019-02-12 NOTE — Patient Instructions (Addendum)
1. Cough with postnasal drip (sensitization to molds) - Continue with cetrizine 10mg  twice daily.  - We will refer you see Dr. Deeann Saint office for vertigo treatment.  - Consider skin testing in the future (this is more sensitive than the blood testing).  2. Gastroesophageal reflux disease - Continue with pantoprazole 40mg  in the morning. - Continue with famotidine 40mg  at night.  - We will not make medication changes at this time.   3. Return in about 6 months (around 08/12/2019). This can be an in-person, a virtual Webex or a telephone follow up visit.   Please inform us of any Emergency Department visits, hospitalizations, or changes in symptoms. Call us before going to the ED for breathing or allergy symptoms since we might be able to fit you in for a sick visit. Feel free to contact us anytime with any questions, problems, or concerns.  It was a pleasure to see you again  today!  Websites that have reliable patient information: 1. American Academy of Asthma, Allergy, and Immunology: www.aaaai.org 2. Food Allergy Research and Education (FARE): foodallergy.org 3. Mothers of Asthmatics: http://www.asthmacommunitynetwork.org 4. American College of Allergy, Asthma, and Immunology: www.acaai.org   COVID-19 Vaccine Information can be found at: ShippingScam.co.uk For questions related to vaccine distribution or appointments, please email vaccine@[place] .com or call [phone].     "Like" [place] on Facebook and Instagram for our latest updates!        Make sure you are registered to vote! If you have moved or changed any of your contact information, you will need to get this updated before voting!  In some cases, you MAY be able to register to vote online: [email or web address]

## 2019-02-12 NOTE — Progress Notes (Signed)
FOLLOW UP  Date of Service/Encounter:  02/12/19   Assessment:   Cough  Chronic rhinitis   Gastroesophageal reflux disease  Vertigo  Plan/Recommendations:   1. Cough with postnasal drip (sensitization to molds) - Continue with cetrizine 10mg  twice daily.  - We will refer you see Dr. Deeann Saint office for vertigo treatment. Consider skin testing in the future (this is more sensitive than the blood testing).  2. Gastroesophageal reflux disease - Continue with pantoprazole 40mg  in the morning. - Continue with famotidine 40mg  at night.  - We will not make medication changes at this time.   3. Return in about 6 months (around 08/12/2019). This can be an in-person, a virtual Webex or a telephone follow up visit.  Subjective:   Amanda Cannon is a 74 y.o. female presenting today for follow up of  Chief Complaint  Patient presents with  . Asthma    Amanda Cannon has a history of the following: Patient Active Problem List   Diagnosis Date Noted  . Trigeminal neuropathy 11/13/2017  . PMR (polymyalgia rheumatica) (HCC) 04/24/2017  . Hyponatremia 07/15/2015  . Hyperlipidemia 07/15/2015  . Esophageal spasm 07/15/2015  . Essential hypertension 07/15/2015  . Depression 07/15/2015  . Asthma 07/15/2015  . Tinnitus 07/15/2015  . Osteoporosis 07/15/2015  . Spasm   . Prinzmetal angina ([place])   . Mitral valve prolapse   . White matter changes     History obtained from: chart review and patient.  Amanda Cannon is a 74 y.o. female presenting for a follow up visit.  She was last seen in September 2020.  At that time, we continue to work on her postnasal drip to help with her cough.  We continued Flonase twice daily and continued cetirizine 10 mg twice daily.  For her reflux, we continued pantoprazole and famotidine.  We also referred her to Dr. Deeann Saint balance clinic.  She was also reporting choking on water, which made me think that we might need to do a swallow study.  Since the  last visit, she has mostly done well. She did get COVID19 vaccine on 02/08/2019 with her husband. She is very happy to have gotten that. Overall since the last visit, she has done fairly well. She continues to have the shortness of breath and coughing, but she knows that this is from the postnasal drip. She points to the bottom of her neck when discussing where the coughing is originating. She has tried using her husband's inhaler on a couple of episodes with some relief. She is not interested in getting her own inhaler at all.   Allergic Rhinitis Symptom History: She does not use the Flonase because it tends to "dry her out". She does take the cetirizine. She is having the sneezing in the morning and at night. She has not tried increasing her cetirizine to twice daily as recommended at the last visit. She has not needed any antibiotics at all. She is not interested in doing additional allergy testing at all.   She is still having episodes of vertigo. She is open to a referral now to address this more fully.   Otherwise, there have been no changes to her past medical history, surgical history, family history, or social history.    Review of Systems  Constitutional: Negative.  Negative for chills, fever, malaise/fatigue and weight loss.  HENT: Positive for congestion. Negative for ear discharge and ear pain.        Positive for postnasal drip.  Eyes: Negative for pain, discharge  and redness.  Respiratory: Positive for cough. Negative for sputum production, shortness of breath and wheezing.   Cardiovascular: Negative.  Negative for chest pain and palpitations.  Gastrointestinal: Negative for abdominal pain, constipation, diarrhea, heartburn, nausea and vomiting.  Skin: Negative.  Negative for itching and rash.  Neurological: Positive for dizziness and headaches.  Endo/Heme/Allergies: Negative for environmental allergies. Does not bruise/bleed easily.       Objective:   Blood pressure 122/78,  pulse 89, temperature 98.2 F (36.8 C), temperature source Temporal, resp. rate 16, height 4' 8.1" (1.425 m), weight 105 lb 9.6 oz (47.9 kg), SpO2 97 %. Body mass index is 23.59 kg/m.   Physical Exam:  Physical Exam  Constitutional: She appears well-developed.  HENT:  Head: Normocephalic and atraumatic.  Right Ear: Tympanic membrane, external ear and ear canal normal.  Left Ear: Tympanic membrane, external ear and ear canal normal.  Nose: Mucosal edema and rhinorrhea present. No nasal deformity or septal deviation. No epistaxis. Right sinus exhibits no maxillary sinus tenderness and no frontal sinus tenderness. Left sinus exhibits no maxillary sinus tenderness and no frontal sinus tenderness.  Mouth/Throat: Uvula is midline and oropharynx is clear and moist. Mucous membranes are not pale and not dry.  Scant clear rhinorrhea.   Eyes: Pupils are equal, round, and reactive to light. Conjunctivae and EOM are normal. Right eye exhibits no chemosis and no discharge. Left eye exhibits no chemosis and no discharge. Right conjunctiva is not injected. Left conjunctiva is not injected.  Cardiovascular: Normal rate, regular rhythm and normal heart sounds.  Respiratory: Effort normal and breath sounds normal. No accessory muscle usage. No tachypnea. No respiratory distress. She has no wheezes. She has no rhonchi. She has no rales. She exhibits no tenderness.  Moving air well in all lung fields. No increased work of breathing noted.   Lymphadenopathy:    She has no cervical adenopathy.  Neurological: She is alert.  Skin: No abrasion, no petechiae and no rash noted. Rash is not papular, not vesicular and not urticarial. No erythema. No pallor.  Psychiatric: She has a normal mood and affect.     Diagnostic studies: none    Salvatore Marvel, MD  [facility]

## 2019-02-13 ENCOUNTER — Encounter: Payer: Self-pay | Admitting: Allergy & Immunology

## 2019-02-14 DIAGNOSIS — H40053 Ocular hypertension, bilateral: Secondary | ICD-10-CM | POA: Diagnosis not present

## 2019-02-14 NOTE — Telephone Encounter (Signed)
Referral has been placed to Dr Benjamine Mola for Vertigo.   Thanks

## 2019-02-15 NOTE — Telephone Encounter (Signed)
Thank you!   Yariel Ferraris, MD [facility]  

## 2019-02-28 ENCOUNTER — Ambulatory Visit: Payer: Medicare Other | Attending: Internal Medicine

## 2019-02-28 DIAGNOSIS — Z23 Encounter for immunization: Secondary | ICD-10-CM

## 2019-02-28 NOTE — Progress Notes (Signed)
   Covid-19 Vaccination Clinic  Name:  Amanda Cannon    MRN: [id number] DOB: 1945/04/12  02/28/2019  [name] was observed post Covid-19 immunization for 30 minutes based on pre-vaccination screening without incidence. She was provided with Vaccine Information Sheet and instruction to access the V-Safe system.   [name] was instructed to call 911 with any severe reactions post vaccine: Marland Kitchen Difficulty breathing  . Swelling of your face and throat  . A fast heartbeat  . A bad rash all over your body  . Dizziness and weakness    Immunizations Administered    Name Date Dose VIS Date Route   Pfizer COVID-19 Vaccine 02/28/2019 12:25 PM 0.3 mL 01/04/2019 Intramuscular   Manufacturer: [place]   Lot: [id number]   [place]: [id number]

## 2019-03-11 ENCOUNTER — Telehealth: Payer: Self-pay | Admitting: Pharmacist

## 2019-03-11 ENCOUNTER — Ambulatory Visit: Payer: Medicare Other
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
## 2019-03-14 DIAGNOSIS — H9313 Tinnitus, bilateral: Secondary | ICD-10-CM | POA: Diagnosis not present

## 2019-03-14 DIAGNOSIS — R42 Dizziness and giddiness: Secondary | ICD-10-CM | POA: Diagnosis not present

## 2019-03-14 DIAGNOSIS — H903 Sensorineural hearing loss, bilateral: Secondary | ICD-10-CM | POA: Diagnosis not present

## 2019-04-03 DIAGNOSIS — R42 Dizziness and giddiness: Secondary | ICD-10-CM | POA: Diagnosis not present

## 2019-04-04 DIAGNOSIS — M150 Primary generalized (osteo)arthritis: Secondary | ICD-10-CM | POA: Diagnosis not present

## 2019-04-04 DIAGNOSIS — M810 Age-related osteoporosis without current pathological fracture: Secondary | ICD-10-CM | POA: Diagnosis not present

## 2019-04-04 DIAGNOSIS — M2550 Pain in unspecified joint: Secondary | ICD-10-CM | POA: Diagnosis not present

## 2019-04-04 DIAGNOSIS — Z6822 Body mass index (BMI) 22.0-22.9, adult: Secondary | ICD-10-CM | POA: Diagnosis not present

## 2019-04-04 DIAGNOSIS — M25551 Pain in right hip: Secondary | ICD-10-CM | POA: Diagnosis not present

## 2019-04-04 DIAGNOSIS — M5030 Other cervical disc degeneration, unspecified cervical region: Secondary | ICD-10-CM | POA: Diagnosis not present

## 2019-04-09 DIAGNOSIS — H8190 Unspecified disorder of vestibular function, unspecified ear: Secondary | ICD-10-CM | POA: Insufficient documentation

## 2019-04-10 DIAGNOSIS — R42 Dizziness and giddiness: Secondary | ICD-10-CM | POA: Diagnosis not present

## 2019-04-10 DIAGNOSIS — H903 Sensorineural hearing loss, bilateral: Secondary | ICD-10-CM | POA: Diagnosis not present

## 2019-04-29 ENCOUNTER — Encounter: Payer: Self-pay | Admitting: Family Medicine

## 2019-04-30 ENCOUNTER — Telehealth: Payer: Self-pay | Admitting: Pharmacist

## 2019-04-30 DIAGNOSIS — E7849 Other hyperlipidemia: Secondary | ICD-10-CM

## 2019-04-30 NOTE — Telephone Encounter (Signed)
Pt sent an email asking about rechecking a lipid panel since increasing her Praluent dose. Reports she gave her 6th injection of Praluent 150mg  today. Scheduled f/u lipid panel in 1 week. Will check advanced NMR panel per pt request.

## 2019-05-03 DIAGNOSIS — M810 Age-related osteoporosis without current pathological fracture: Secondary | ICD-10-CM | POA: Diagnosis not present

## 2019-05-03 DIAGNOSIS — M2550 Pain in unspecified joint: Secondary | ICD-10-CM | POA: Diagnosis not present

## 2019-05-06 ENCOUNTER — Other Ambulatory Visit: Payer: Medicare Other | Admitting: *Deleted

## 2019-05-06 ENCOUNTER — Other Ambulatory Visit: Payer: Self-pay

## 2019-05-06 DIAGNOSIS — E7849 Other hyperlipidemia: Secondary | ICD-10-CM | POA: Diagnosis not present

## 2019-05-07 LAB — SPECIMEN STATUS REPORT

## 2019-05-09 ENCOUNTER — Ambulatory Visit: Payer: Medicare Other | Attending: Family Medicine | Admitting: Physical Therapy

## 2019-05-09 ENCOUNTER — Other Ambulatory Visit: Payer: Self-pay

## 2019-05-09 ENCOUNTER — Encounter: Payer: Self-pay | Admitting: Physical Therapy

## 2019-05-09 DIAGNOSIS — R2681 Unsteadiness on feet: Secondary | ICD-10-CM

## 2019-05-09 DIAGNOSIS — R2689 Other abnormalities of gait and mobility: Secondary | ICD-10-CM | POA: Diagnosis not present

## 2019-05-09 DIAGNOSIS — R42 Dizziness and giddiness: Secondary | ICD-10-CM | POA: Diagnosis not present

## 2019-05-10 DIAGNOSIS — M810 Age-related osteoporosis without current pathological fracture: Secondary | ICD-10-CM | POA: Diagnosis not present

## 2019-05-10 NOTE — Therapy (Signed)
[facility] [address] [place], [place], [address] Phone: [phone]   Fax:  [phone]  Physical Therapy Evaluation  Patient Details  Name: Amanda Cannon MRN: [id number] Date of Birth: 03/06/1945 Referring Provider (PT): Leta Baptist, MD   Encounter Date: 05/09/2019  PT End of Session - 05/10/19 1127    Visit Number  1    Number of Visits  9    Date for PT Re-Evaluation  06/14/19    Authorization Type  Medicare    Authorization Time Period  05-09-19 - 08-06-19    PT Start Time  1017    PT Stop Time  1100    PT Time Calculation (min)  43 min    Activity Tolerance  Patient tolerated treatment well    Behavior During Therapy  [facility] for tasks assessed/performed       Past Medical History:  Diagnosis Date  . Alcohol use   . Anemia   . Anxiety   . Asthma   . Basal cell carcinoma    forehead  . Breast cancer ([place]) 1998   left   . Colon polyps   . GERD (gastroesophageal reflux disease)   . Glaucoma   . Hyperlipidemia   . Hypertension   . Hyponatremia    remote, mild  . Mitral valve prolapse   . Osteoarthritis    sees rheumatologist  . Osteoporosis    sees rheumatologist  . Personal history of radiation therapy 1998  . PMR (polymyalgia rheumatica) (HCC)    sees rheumatologist  . Pneumonia   . 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 White matter changes    ? ischemic sm vs per pt    Past Surgical History:  Procedure Laterality Date  . ABDOMINAL HYSTERECTOMY    . BREAST LUMPECTOMY Left 1998   cancer  . CESAREAN SECTION     x 4  . COLONOSCOPY  07/2012   in [place]  . HERNIA REPAIR  2005   abdominal hernia repair  . HYSTEROTOMY    . LYMPH NODE DISSECTION Left   . POLYPECTOMY      There were no vitals filed for this visit.   Subjective Assessment - 05/09/19 1023    Subjective  Pt reports problem of vertigo/imbalance started approx. 4 yrs when she moved from [place] to [place]; reports unsteadiness and LOB  with turns; pt reports she gets light-headed when she transfers from sit to stand; pt states it feels numb inside left ear;  pt has brought report of vestibular testing from Jason Nest, audiologist at Dr. Deeann Saint office (testing was done on 04-03-19); pt reports episodic dizziness as imbalance and lightheadedness; daily episodes of quick duration of only seconds and occur with any head or body motion    Pertinent History  bilateral symmetric sensorineural hearing loss:  bilateral occasional tinnitus; motion sickness; glaucoma; cataract lenses; HTN: osteoporosis; arthritis; trigeminal neuropathy:  polymyalgia rheumatica:  anxiety/depression    Patient Stated Goals  improve balance    Currently in Pain?  --   Lt ear is numb - NOT painful but numb        [facility] PT Assessment - 05/10/19 0001      Assessment   Medical Diagnosis  Vertigo; Imbalance     Referring Provider (PT)  Leta Baptist, MD    Onset Date/Surgical Date  --   approx. 4 yrs ago     Precautions   Precautions  None      Balance Screen  Has the patient fallen in the past 6 months  No    Has the patient had a decrease in activity level because of a fear of falling?   No    Is the patient reluctant to leave their home because of a fear of falling?   No      Prior Function   Level of Independence  Independent   walks with husband for safety; hasn't driven for past year     Observation/Other Assessments   Focus on Therapeutic Outcomes (FOTO)   Pt's physical FS primary measure 40/100;  55/100 risk-adjusted statistical     Other Surveys   Dizziness Handicap Inventory (DHI)    Dizziness Handicap Inventory (DHI)   52%      Ambulation/Gait   Ambulation/Gait  Yes    Ambulation/Gait Assistance  6: Modified independent (Device/Increase time)    Ambulation Distance (Feet)  100 Feet    Assistive device  None    Gait Pattern  Within Functional Limits    Ambulation Surface  Level;Indoor    Gait Comments  significant LOB with horizontal  and vertical head turns during amb.            Vestibular Assessment - 05/10/19 0001      Vestibular Assessment   General Observation  pt is a 74 yr old lady with 4 yr h/o episodic dizziness & imbalance - occurs with quick turns and head movements        Symptom Behavior   Type of Dizziness   Blurred vision;Imbalance;Unsteady with head/body turns;Lightheadedness;"Funny feeling in head"    Frequency of Dizziness  constant    Symptom Nature  Constant   fullness in both ears; occasional tinnitus   Progression of Symptoms  Worse   mild progression of worsening     Oculomotor Exam   Oculomotor Alignment  Normal    Ocular ROM  WNL's    Spontaneous  Absent    Smooth Pursuits  Intact    Saccades  Intact      Oculomotor Exam-Fixation Suppressed    Ocular Alignment  WNL's      Visual Acuity   Static  line 8    Dynamic  line 4    c/o dizziness upon completion of test      Positional Testing   Dix-Hallpike  Dix-Hallpike Right;Dix-Hallpike Left    Sidelying Test  Sidelying Right;Sidelying Left      Dix-Hallpike Right   Dix-Hallpike Right Duration  none    Dix-Hallpike Right Symptoms  No nystagmus      Dix-Hallpike Left   Dix-Hallpike Left Duration  none    Dix-Hallpike Left Symptoms  No nystagmus      Sidelying Right   Sidelying Right Duration  none    Sidelying Right Symptoms  No nystagmus   c/o light-headedness with return to upright      Sidelying Left   Sidelying Left Duration  none    Sidelying Left Symptoms  No nystagmus   c/o light-headedness      Positional Sensitivities   Sit to Supine  No dizziness    Up from Right Hallpike  Mild dizziness    Up from Left Hallpike  Mild dizziness    Head Turning x 5  Mild dizziness    Head Nodding x 5  Moderate dizziness          Objective measurements completed on examination: See above findings.  PT Education - 05/10/19 1126    Education Details  eval results with POC 4 weeks only due to  pt leaving in May for [place] for 3 wks to visit daughter    Person(s) Educated  Patient    Methods  Explanation    Comprehension  Verbalized understanding          PT Long Term Goals - 05/10/19 1154      PT LONG TERM GOAL #1   Title  Improve DHI score from 52% to </= 40% to indicate improvement in dizziness for improved quality of ife.    Baseline  52%- 05-09-19    Time  4    Period  Weeks    Status  New    Target Date  06/07/19      PT LONG TERM GOAL #2   Title  Increase FGA score by at least 4 points to demo improved balance during gait for incr. safety.    Baseline  TBA    Time  4    Period  Weeks    Status  New    Target Date  06/07/19      PT LONG TERM GOAL #3   Title  Improve DVA to </= 2 line difference for improved gaze stabilization.    Baseline  4 line difference on 05-09-19    Time  4    Period  Weeks    Status  New    Target Date  06/07/19      PT LONG TERM GOAL #4   Title  Independent in HEP for balance and vestibular exercises.    Time  4    Period  Weeks    Status  New    Target Date  06/07/19             Plan - 05/10/19 1129    Clinical Impression Statement  Pt is a 74 yr old lady with c/o episodic dizziness of short duration which occurs with turns and head movements.  She describes dizziness as episodes that occur intermittently as an imbalance and lightheadedness.  Pt underwent vestibular testing by Despina Hick, audiologist with Dr. Deeann Saint office, and results revealed "abnormal vestibular assessment with central & peripheral indicators".  Pt's DVA is abnormal with a 4 line difference and pt is unable to amb. with head turns.  Pt demonstrates vestibular hypofunction as she is able to perform Romberg EO for 30 secs but is unable to perform with EC.  Pt is unable to perform Sharpened Romberg due to balance deficits. Pt had no nystagmus with any positional testing.  Pt will benefit from vestibular PT to address balance, vestibular and gait  impairments.    Personal Factors and Comorbidities  Comorbidity 2;Past/Current Experience;Time since onset of injury/illness/exacerbation    Comorbidities  trigeminal neuropathy, polymyalgia rheumatica, HTN, depression, arthritis, mitral valve prolapse; mild inferior abrasion in Rt external auditory canal per audiologist's report from 04-03-19 visit:  tinnitus, motion sickness, glaaucoma, sensorineural hearing loss, h/o breast cancer    Examination-Activity Limitations  Locomotion Level;Transfers;Bend;Stairs;Squat;Stand    Examination-Participation Restrictions  Meal Prep;Cleaning;Community Activity;Driving;Shop    Stability/Clinical Decision Making  Evolving/Moderate complexity    Clinical Decision Making  Moderate    Rehab Potential  Good    PT Frequency  2x / week    PT Duration  4 weeks    PT Treatment/Interventions  ADLs/Self Care Home Management;Vestibular;Gait training;Therapeutic activities;Therapeutic exercise;Balance training;Neuromuscular re-education;Patient/family education    PT Next Visit Plan  do FGA;  begin HEP - x1 viewing &  balance HEP - balance on foam with EO and EC    Consulted and Agree with Plan of Care  Patient       Patient will benefit from skilled therapeutic intervention in order to improve the following deficits and impairments:  Difficulty walking, Decreased balance, Impaired vision/preception  Visit Diagnosis: Dizziness and giddiness - Plan: PT plan of care cert/re-cert  Other abnormalities of gait and mobility - Plan: PT plan of care cert/re-cert  Unsteadiness on feet - Plan: PT plan of care cert/re-cert     Problem List Patient Active Problem List   Diagnosis Date Noted  . Trigeminal neuropathy 11/13/2017  . PMR (polymyalgia rheumatica) (HCC) 04/24/2017  . Hyponatremia 07/15/2015  . Hyperlipidemia 07/15/2015  . Esophageal spasm 07/15/2015  . Essential hypertension 07/15/2015  . Depression 07/15/2015  . Asthma 07/15/2015  . Tinnitus 07/15/2015   . Osteoporosis 07/15/2015  . Spasm   . 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 Mitral valve prolapse   . White matter changes     Marshall Kampf, [place], PT 05/10/2019, 12:05 PM  [facility] [address] [place], [place], [address] Phone: [phone]   Fax:  [phone]  Name: Amanda Cannon MRN: [id number] Date of Birth: [birth year]

## 2019-05-13 DIAGNOSIS — N644 Mastodynia: Secondary | ICD-10-CM | POA: Diagnosis not present

## 2019-05-13 DIAGNOSIS — Z79899 Other long term (current) drug therapy: Secondary | ICD-10-CM | POA: Diagnosis not present

## 2019-05-13 DIAGNOSIS — M353 Polymyalgia rheumatica: Secondary | ICD-10-CM | POA: Diagnosis not present

## 2019-05-13 DIAGNOSIS — Z853 Personal history of malignant neoplasm of breast: Secondary | ICD-10-CM | POA: Diagnosis not present

## 2019-05-13 DIAGNOSIS — Z170 Estrogen receptor positive status [ER+]: Secondary | ICD-10-CM | POA: Diagnosis not present

## 2019-05-13 DIAGNOSIS — C50512 Malignant neoplasm of lower-outer quadrant of left female breast: Secondary | ICD-10-CM | POA: Diagnosis not present

## 2019-05-17 LAB — NMR, LIPOPROFILE
Cholesterol, Total: 202 mg/dL — ABNORMAL HIGH (ref 100–199)
HDL Particle Number: 57.3 umol/L (ref >=30.5)
HDL-C: 103 mg/dL (ref >39)
LDL Particle Number: 779 nmol/L (ref <1000)
LDL Size: 21.1 nm (ref >20.5)
LDL-C (NIH Calc): 85 mg/dL (ref 0–99)
LP-IR Score: 32 (ref <=45)
Small LDL Particle Number: <90 nmol/L (ref <=527)
Triglycerides: 80 mg/dL (ref 0–149)

## 2019-05-20 ENCOUNTER — Other Ambulatory Visit: Payer: Self-pay

## 2019-05-20 ENCOUNTER — Ambulatory Visit: Payer: Medicare Other

## 2019-05-20 DIAGNOSIS — R42 Dizziness and giddiness: Secondary | ICD-10-CM | POA: Diagnosis not present

## 2019-05-20 DIAGNOSIS — R2681 Unsteadiness on feet: Secondary | ICD-10-CM

## 2019-05-20 DIAGNOSIS — R2689 Other abnormalities of gait and mobility: Secondary | ICD-10-CM

## 2019-05-20 NOTE — Patient Instructions (Signed)
Gaze Stabilization: Tip Card  1.Target must remain in focus, not blurry, and appear stationary while head is in motion. 2.Perform exercises with small head movements (45 to either side of midline). 3.Increase speed of head motion so long as target is in focus. 4.If you wear eyeglasses, be sure you can see target through lens (therapist will give specific instructions for bifocal / progressive lenses). 5.These exercises may provoke dizziness or nausea. Work through these symptoms. If too dizzy, slow head movement slightly. Rest between each exercise. 6.Exercises demand concentration; avoid distractions. 7.For safety, perform standing exercises close to a counter, wall, corner, or next to someone. (DO NOT PERFORM IN STANDING YET).  Copyright  VHI. All rights reserved.    Gaze Stabilization: Sitting    Keeping eyes on target on wall 3-5 feet away, tilt head down 15-30 and move head side to side for __10__ reps. Repeat while moving head up and down for _10___ reps. Do __2-3__ sessions per day.  Copyright  VHI. All rights reserved.   PERFORM IN A CORNER WITH CHAIR IN FRONT OF YOU OR AT KITCHEN SINK WITH CHAIR BEHIND YOU FOR SAFETY: Feet Apart, Head Motion - Eyes Open    With eyes open, feet apart, move head slowly: up and down 10 TIMES AND SIDE TO SIDE 10 TIMES. Repeat __3__ times per session. Do __1__ sessions per day.  Copyright  VHI. All rights reserved.  Feet Apart (Compliant Surface) Varied Arm Positions - Eyes OPEN    Stand on compliant surface: __PILLOWS AND CUSHIONS______ with feet shoulder width apart and arms AT YOUR SIDE. EYE OPEN. Hold__30__ seconds. Repeat __3__ times per session. Do _1___ sessions per day.  Copyright  VHI. All rights reserved.

## 2019-05-20 NOTE — Therapy (Signed)
[facility] [address] [place], [place], [address] Phone: [phone]   Fax:  [phone]  Physical Therapy Treatment  Patient Details  Name: Amanda Cannon MRN: [id number] Date of Birth: Sep 08, 1945 Referring Provider (PT): Leta Baptist, MD   Encounter Date: 05/20/2019  PT End of Session - 05/20/19 1747    Visit Number  2    Number of Visits  9    Date for PT Re-Evaluation  06/14/19    Authorization Type  Medicare    Authorization Time Period  05-09-19 - 08-06-19    PT Start Time  1700    PT Stop Time  1743    PT Time Calculation (min)  43 min    Equipment Utilized During Treatment  Other (comment)   min guard to min A prn   Activity Tolerance  Patient tolerated treatment well    Behavior During Therapy  [facility] for tasks assessed/performed       Past Medical History:  Diagnosis Date  . Alcohol use   . Anemia   . Anxiety   . Asthma   . Basal cell carcinoma    forehead  . Breast cancer ([place]) 1998   left   . Colon polyps   . GERD (gastroesophageal reflux disease)   . Glaucoma   . Hyperlipidemia   . Hypertension   . Hyponatremia    remote, mild  . Mitral valve prolapse   . Osteoarthritis    sees rheumatologist  . Osteoporosis    sees rheumatologist  . Personal history of radiation therapy 1998  . PMR (polymyalgia rheumatica) (HCC)    sees rheumatologist  . Pneumonia   . Prinzmetal angina ([place])   . White matter changes    ? ischemic sm vs per pt    Past Surgical History:  Procedure Laterality Date  . ABDOMINAL HYSTERECTOMY    . BREAST LUMPECTOMY Left 1998   cancer  . CESAREAN SECTION     x 4  . COL[place]COPY  07/2012   in [place]  . HERNIA REPAIR  2005   abdominal hernia repair  . HYSTEROTOMY    . LYMPH NODE DISSECTION Left   . POLYPECTOMY      There were no vitals filed for this visit.  Subjective Assessment - 05/20/19 1704    Subjective  Pt reported she felt woozy, dizzy and unsteady after the  PT eval but felt better the next day. Pt reported when she drinks water, her L ear feels like it "stops up".    Pertinent History  bilateral symmetric sensorineural hearing loss:  bilateral occasional tinnitus; motion sickness; glaucoma; cataract lenses; HTN: osteoporosis; arthritis; trigeminal neuropathy:  polymyalgia rheumatica:  anxiety/depression    Patient Stated Goals  improve balance    Currently in Pain?  No/denies         [facility] PT Assessment - 05/20/19 1707      Functional Gait  Assessment   Gait assessed   Yes    Gait Level Surface  Walks 20 ft in less than 7 sec but greater than 5.5 sec, uses assistive device, slower speed, mild gait deviations, or deviates 6-10 in outside of the 12 in walkway width.    Change in Gait Speed  Able to smoothly change walking speed without loss of balance or gait deviation. Deviate no more than 6 in outside of the 12 in walkway width.    Gait with Horizontal Head Turns  Performs head turns with moderate changes in  gait velocity, slows down, deviates 10-15 in outside 12 in walkway width but recovers, can continue to walk.    Gait with Vertical Head Turns  Performs task with moderate change in gait velocity, slows down, deviates 10-15 in outside 12 in walkway width but recovers, can continue to walk.    Gait and Pivot Turn  Turns slowly, requires verbal cueing, or requires several small steps to catch balance following turn and stop    Step Over Obstacle  Is able to step over one shoe box (4.5 in total height) without changing gait speed. No evidence of imbalance.    Gait with Narrow Base of Support  Ambulates 7-9 steps.    Gait with Eyes Closed  Cannot walk 20 ft without assistance, severe gait deviations or imbalance, deviates greater than 15 in outside 12 in walkway width or will not attempt task.    Ambulating Backwards  Cannot walk 20 ft without assistance, severe gait deviations or imbalance, deviates greater than 15 in outside 12 in walkway width or  will not attempt task.    Steps  Alternating feet, no rail.    Total Score  15    FGA comment:  15/30: high falls risk.       NMR: Gaze Stabilization: Tip Card  1.Target must remain in focus, not blurry, and appear stationary while head is in motion. 2.Perform exercises with small head movements (45 to either side of midline). 3.Increase speed of head motion so long as target is in focus. 4.If you wear eyeglasses, be sure you can see target through lens (therapist will give specific instructions for bifocal / progressive lenses). 5.These exercises may provoke dizziness or nausea. Work through these symptoms. If too dizzy, slow head movement slightly. Rest between each exercise. 6.Exercises demand concentration; avoid distractions. 7.For safety, perform standing exercises close to a counter, wall, corner, or next to someone. (DO NOT PERFORM IN STANDING YET).  Copyright  VHI. All rights reserved.    Gaze Stabilization: Sitting    Keeping eyes on target on wall 3-5 feet away, tilt head down 15-30 and move head side to side for __10__ reps. Repeat while moving head up and down for _10___ reps. Do __2-3__ sessions per day.  Copyright  VHI. All rights reserved.   PERFORM IN A CORNER WITH CHAIR IN FRONT OF YOU OR AT KITCHEN SINK WITH CHAIR BEHIND YOU FOR SAFETY: Feet Apart, Head Motion - Eyes Open    With eyes open, feet apart, move head slowly: up and down 10 TIMES AND SIDE TO SIDE 10 TIMES. Repeat __3__ times per session. Do __1__ sessions per day.  Copyright  VHI. All rights reserved.  Feet Apart (Compliant Surface) Varied Arm Positions - Eyes OPEN    Stand on compliant surface: __PILLOWS AND CUSHIONS______ with feet shoulder width apart and arms AT YOUR SIDE. EYE OPEN. Hold__30__ seconds. Repeat __3__ times per session. Do _1___ sessions per day.  Copyright  VHI. All rights reserved.              Vestibular Treatment/Exercise - 05/20/19 1720       Vestibular Treatment/Exercise   Vestibular Treatment Provided  Gaze    Gaze Exercises  X1 Viewing Horizontal;X1 Viewing Vertical      X1 Viewing Horizontal   Foot Position  seated    Time  --   30 sec. x1 trial   Reps  4    Comments  Pt required max cues and demo for technique, especially to  perform rotation equally to L side as pt with tendency to rotate to R side only. Performed 2x10 reps with improved technique. See pt instructions for details.      X1 Viewing Vertical   Foot Position  seated    Time  --   n/a   Reps  2    Comments  Cues and demo for technique. Please see pt instructions for details.       Pt required rest breaks to allow wooziness 4/10 during x1 horizontal viewing and 2/10 during vertical viewing to subside.       PT Education - 05/20/19 1747    Education Details  PT educated pt on FGA results and provided pt with x1 viewing and balance HEP.    Person(s) Educated  Patient    Methods  Explanation;Demonstration;Verbal cues;Handout    Comprehension  Returned demonstration;Verbalized understanding;Need further instruction          PT Long Term Goals - 05/20/19 1750      PT LONG TERM GOAL #1   Title  Improve DHI score from 52% to </= 40% to indicate improvement in dizziness for improved quality of ife.    Baseline  52%- 05-09-19    Time  4    Period  Weeks    Status  New      PT LONG TERM GOAL #2   Title  Increase FGA score by at least 4 points to demo improved balance during gait for incr. safety.    Baseline  23/30 on 05/20/19    Time  4    Period  Weeks    Status  New      PT LONG TERM GOAL #3   Title  Improve DVA to </= 2 line difference for improved gaze stabilization.    Baseline  4 line difference on 05-09-19    Time  4    Period  Weeks    Status  New      PT LONG TERM GOAL #4   Title  Independent in HEP for balance and vestibular exercises.    Time  4    Period  Weeks    Status  New            Plan - 05/20/19 1748     Clinical Impression Statement  Pt's FGA score indicated pt is at high risk for falls. Pt experienced incr. postural sway and LOB during activities which require incr. vestibular input, such as eyes closed and head turns. PT provided pt with x1 viewing and balance HEP to improve vestibular input. Pt would continue to benefit from skilled PT to improve safety during functional mobility.    Personal Factors and Comorbidities  Comorbidity 2;Past/Current Experience;Time since onset of injury/illness/exacerbation    Comorbidities  trigeminal neuropathy, polymyalgia rheumatica, HTN, depression, arthritis, mitral valve prolapse; mild inferior abrasion in Rt external auditory canal per audiologist's report from 04-03-19 visit:  tinnitus, motion sickness, glaaucoma, sensorineural hearing loss, h/o breast cancer    Examination-Activity Limitations  Locomotion Level;Transfers;Bend;Stairs;Squat;Stand    Examination-Participation Restrictions  Meal Prep;Cleaning;Community Activity;Driving;Shop    Stability/Clinical Decision Making  Evolving/Moderate complexity    Rehab Potential  Good    PT Frequency  2x / week    PT Duration  4 weeks    PT Treatment/Interventions  ADLs/Self Care Home Management;Vestibular;Gait training;Therapeutic activities;Therapeutic exercise;Balance training;Neuromuscular re-education;Patient/family education    PT Next Visit Plan  Reivew HEP - x1 viewing &  balance as indicated - balance on foam  with EO and EC dynamic balance/gait activities.    PT Home Exercise Plan  x1 viewng and corner balance    Consulted and Agree with Plan of Care  Patient       Patient will benefit from skilled therapeutic intervention in order to improve the following deficits and impairments:  Difficulty walking, Decreased balance, Impaired vision/preception  Visit Diagnosis: Dizziness and giddiness  Other abnormalities of gait and mobility  Unsteadiness on feet     Problem List Patient Active Problem  List   Diagnosis Date Noted  . Trigeminal neuropathy 11/13/2017  . PMR (polymyalgia rheumatica) (HCC) 04/24/2017  . Hyponatremia 07/15/2015  . Hyperlipidemia 07/15/2015  . Esophageal spasm 07/15/2015  . Essential hypertension 07/15/2015  . Depression 07/15/2015  . Asthma 07/15/2015  . Tinnitus 07/15/2015  . Osteoporosis 07/15/2015  . Spasm   . Prinzmetal angina ([place])   . Mitral valve prolapse   . White matter changes     Samreen Seltzer L 05/20/2019, 5:50 PM   Geoffry Paradise, PT,DPT 05/20/19 5:51 PM Phone: [phone] Fax: [facility] [address] [place], [place], [address] Phone: [phone]   Fax:  [phone]  Name: Deyla Tebbe MRN: [id number] Date of Birth: [birth year]

## 2019-05-23 ENCOUNTER — Other Ambulatory Visit: Payer: Self-pay

## 2019-05-23 ENCOUNTER — Ambulatory Visit: Payer: Medicare Other | Admitting: Physical Therapy

## 2019-05-23 VITALS — BP 128/86 | HR 79

## 2019-05-23 DIAGNOSIS — F4322 Adjustment disorder with anxiety: Secondary | ICD-10-CM | POA: Diagnosis not present

## 2019-05-23 DIAGNOSIS — R42 Dizziness and giddiness: Secondary | ICD-10-CM

## 2019-05-23 DIAGNOSIS — R2681 Unsteadiness on feet: Secondary | ICD-10-CM | POA: Diagnosis not present

## 2019-05-23 DIAGNOSIS — R2689 Other abnormalities of gait and mobility: Secondary | ICD-10-CM | POA: Diagnosis not present

## 2019-05-24 ENCOUNTER — Encounter: Payer: Self-pay | Admitting: Physical Therapy

## 2019-05-24 NOTE — Therapy (Signed)
[facility] [address] [place], [place], [address] Phone: [phone]   Fax:  [phone]  Physical Therapy Treatment  Patient Details  Name: Amanda Cannon MRN: [id number] Date of Birth: November 20, 1945 Referring Provider (PT): Leta Baptist, MD   Encounter Date: 05/23/2019  PT End of Session - 05/24/19 1021    Visit Number  3    Number of Visits  9    Date for PT Re-Evaluation  06/14/19    Authorization Type  Medicare    Authorization Time Period  05-09-19 - 08-06-19    PT Start Time  1618    PT Stop Time  1700    PT Time Calculation (min)  42 min    Activity Tolerance  Patient tolerated treatment well    Behavior During Therapy  [facility] for tasks assessed/performed       Past Medical History:  Diagnosis Date  . Alcohol use   . Anemia   . Anxiety   . Asthma   . Basal cell carcinoma    forehead  . Breast cancer ([place]) 1998   left   . Colon polyps   . GERD (gastroesophageal reflux disease)   . Glaucoma   . Hyperlipidemia   . Hypertension   . Hyponatremia    remote, mild  . Mitral valve prolapse   . Osteoarthritis    sees rheumatologist  . Osteoporosis    sees rheumatologist  . Personal history of radiation therapy 1998  . PMR (polymyalgia rheumatica) (HCC)    sees rheumatologist  . Pneumonia   . Prinzmetal angina ([place])   . White matter changes    ? ischemic sm vs per pt    Past Surgical History:  Procedure Laterality Date  . ABDOMINAL HYSTERECTOMY    . BREAST LUMPECTOMY Left 1998   cancer  . CESAREAN SECTION     x 4  . [place]  07/2012   in [place]  . HERNIA REPAIR  2005   abdominal hernia repair  . HYSTEROTOMY    . LYMPH NODE DISSECTION Left   . POLYPECTOMY      Vitals:   05/23/19 1631  BP: 128/86  Pulse: 79  above reading recorded at start of session in seated position  Subjective Assessment - 05/23/19 1624    Subjective  Pt states she went to [employer] this am and that was very difficult  with the close aisles and the closeness; is doing 3 sets 10 reps of the letter exercise at home.  Pt reports "this therapy is very difficult for me"    Pertinent History  bilateral symmetric sensorineural hearing loss:  bilateral occasional tinnitus; motion sickness; glaucoma; cataract lenses; HTN: osteoporosis; arthritis; trigeminal neuropathy:  polymyalgia rheumatica:  anxiety/depression    Patient Stated Goals  improve balance    Currently in Pain?  No/denies             Vestibular Assessment - 05/24/19 0001      Orthostatics   BP supine (x 5 minutes)  129/83    HR supine (x 5 minutes)  76    BP sitting  124/89    HR sitting  83    BP standing (after 1 minute)  115/78    HR standing (after 1 minute)  87    BP standing (after 3 minutes)  115/80    HR standing (after 3 minutes)  91                Vestibular  Treatment/Exercise - 05/24/19 0001      Vestibular Treatment/Exercise   Vestibular Treatment Provided  Gaze    Gaze Exercises  X1 Viewing Horizontal;X1 Viewing Vertical      X1 Viewing Horizontal   Foot Position  standing    Time  --   30 secs   Reps  1    Comments  mod cues and demo for correct performance      X1 Viewing Vertical   Foot Position  standing    Time  --   30 secs   Reps  1    Comments  mod cues and demo for correct performance         Balance Exercises - 05/24/19 1007      Balance Exercises: Standing   Standing Eyes Opened  Wide (BOA);Head turns;Foam/compliant surface;5 reps    Standing Eyes Closed  Wide (BOA);Head turns;Foam/compliant surface;5 reps    Rockerboard  Anterior/posterior;EO;10 reps;UE support   inside // bars   Gait with Head Turns  Forward;2 reps   30'    Other Standing Exercises  pt performed marching on pillows with CGA EO - then added horizontal and vertical head turns 5 reps each direction         PT Education - 05/24/19 1019    Education Details  progressed x 1 viewing exercise from seated to standing  (with pt instructed to hold back of chair in front prn); added slow marching on pillows with UE support prn    Person(s) Educated  Patient    Methods  Explanation;Demonstration    Comprehension  Verbalized understanding;Returned demonstration          PT Long Term Goals - 05/24/19 1029      PT LONG TERM GOAL #1   Title  Improve DHI score from 52% to </= 40% to indicate improvement in dizziness for improved quality of ife.    Baseline  52%- 05-09-19    Time  4    Period  Weeks    Status  New      PT LONG TERM GOAL #2   Title  Increase FGA score by at least 4 points to demo improved balance during gait for incr. safety.    Baseline  23/30 on 05/20/19    Time  4    Period  Weeks    Status  New      PT LONG TERM GOAL #3   Title  Improve DVA to </= 2 line difference for improved gaze stabilization.    Baseline  4 line difference on 05-09-19    Time  4    Period  Weeks    Status  New      PT LONG TERM GOAL #4   Title  Independent in HEP for balance and vestibular exercises.    Time  4    Period  Weeks    Status  New            Plan - 05/24/19 1022    Clinical Impression Statement  Orthostatics assessment was completed due to pt's c/o moderate to severe light-headedness with return to upright position from bending over and also with supine to sit transfers at times.  BP readings do not indicate orthostatic hypotension, however, pt's heart rate increased from 76 bpm in sitting to 91 bpm after 3" standing.  MIn. c/o light-headedness reported during assessment.  Pt continues to demo signficant hypofunction as moderate to severe sway and LOB with standing activities on  compliant surface with EC.    Personal Factors and Comorbidities  Comorbidity 2;Past/Current Experience;Time since onset of injury/illness/exacerbation    Comorbidities  trigeminal neuropathy, polymyalgia rheumatica, HTN, depression, arthritis, mitral valve prolapse; mild inferior abrasion in Rt external auditory  canal per audiologist's report from 04-03-19 visit:  tinnitus, motion sickness, glaaucoma, sensorineural hearing loss, h/o breast cancer    Examination-Activity Limitations  Locomotion Level;Transfers;Bend;Stairs;Squat;Stand    Examination-Participation Restrictions  Meal Prep;Cleaning;Community Activity;Driving;Shop    Stability/Clinical Decision Making  Evolving/Moderate complexity    Rehab Potential  Good    PT Frequency  2x / week    PT Duration  4 weeks    PT Treatment/Interventions  ADLs/Self Care Home Management;Vestibular;Gait training;Therapeutic activities;Therapeutic exercise;Balance training;Neuromuscular re-education;Patient/family education    PT Next Visit Plan  balance on foam with EO and EC dynamic balance/gait activities - any activity to increase vestibular input in maintaining balance    PT Home Exercise Plan  x1 viewng and corner balance; progressed x1 viewing to standing position and added marching on pillows to HEP on 05-23-19    Consulted and Agree with Plan of Care  Patient       Patient will benefit from skilled therapeutic intervention in order to improve the following deficits and impairments:  Difficulty walking, Decreased balance, Impaired vision/preception  Visit Diagnosis: Other abnormalities of gait and mobility  Unsteadiness on feet  Dizziness and giddiness     Problem List Patient Active Problem List   Diagnosis Date Noted  . Trigeminal neuropathy 11/13/2017  . PMR (polymyalgia rheumatica) (HCC) 04/24/2017  . Hyponatremia 07/15/2015  . Hyperlipidemia 07/15/2015  . Esophageal spasm 07/15/2015  . Essential hypertension 07/15/2015  . Depression 07/15/2015  . Asthma 07/15/2015  . Tinnitus 07/15/2015  . Osteoporosis 07/15/2015  . Spasm   . Prinzmetal angina ([place])   . Mitral valve prolapse   . White matter changes     Allana Shrestha, [place], PT 05/24/2019, 10:32 AM  [facility] [address] [place], [place], [address] Phone: [phone]   Fax:  [phone]  Name: Liva Schaal MRN: [id number] Date of Birth: Jul 12, 1945

## 2019-05-27 ENCOUNTER — Other Ambulatory Visit: Payer: Self-pay

## 2019-05-27 ENCOUNTER — Ambulatory Visit: Payer: Medicare Other | Attending: Otolaryngology | Admitting: Physical Therapy

## 2019-05-27 DIAGNOSIS — R2689 Other abnormalities of gait and mobility: Secondary | ICD-10-CM | POA: Insufficient documentation

## 2019-05-27 DIAGNOSIS — Z85828 Personal history of other malignant neoplasm of skin: Secondary | ICD-10-CM | POA: Diagnosis not present

## 2019-05-27 DIAGNOSIS — R42 Dizziness and giddiness: Secondary | ICD-10-CM | POA: Insufficient documentation

## 2019-05-27 DIAGNOSIS — D692 Other nonthrombocytopenic purpura: Secondary | ICD-10-CM | POA: Diagnosis not present

## 2019-05-27 DIAGNOSIS — L570 Actinic keratosis: Secondary | ICD-10-CM | POA: Diagnosis not present

## 2019-05-27 DIAGNOSIS — L814 Other melanin hyperpigmentation: Secondary | ICD-10-CM | POA: Diagnosis not present

## 2019-05-27 DIAGNOSIS — L821 Other seborrheic keratosis: Secondary | ICD-10-CM | POA: Diagnosis not present

## 2019-05-27 DIAGNOSIS — R2681 Unsteadiness on feet: Secondary | ICD-10-CM | POA: Diagnosis not present

## 2019-05-27 NOTE — Patient Instructions (Signed)
Gaze Stabilization - Tip Card  1.Target must remain in focus, not blurry, and appear stationary while head is in motion. 2.Perform exercises with small head movements (45 to either side of midline). 3.Increase speed of head motion so long as target is in focus. 4.If you wear eyeglasses, be sure you can see target through lens (therapist will give specific instructions for bifocal / progressive lenses). 5.These exercises may provoke dizziness or nausea. Work through these symptoms. If too dizzy, slow head movement slightly. Rest between each exercise. 6.Exercises demand concentration; avoid distractions. 7.For safety, perform standing exercises close to a counter, wall, corner, or next to someone.  Copyright  VHI. All rights reserved.   Gaze Stabilization - Standing Feet Apart   Feet shoulder width apart, keeping eyes on target on wall 3 feet away, tilt head down slightly and move head side to side for 15 repetitions.  Repeat while moving head up and down for 15 repetitions.  Do 2-3 sessions per day.   PERFORM IN A CORNER WITH CHAIR IN FRONT OF YOU OR AT KITCHEN SINK WITH CHAIR BEHIND YOU FOR SAFETY: Feet Apart, Head Motion - Eyes Open    With eyes open, feet apart, move head slowly: up and down 10 TIMES AND SIDE TO SIDE 10 TIMES. Repeat __3__ times per session. Do __1__ sessions per day.  Copyright  VHI. All rights reserved.  Feet Apart (Compliant Surface) Varied Arm Positions - Eyes OPEN    Stand on compliant surface: __PILLOWS AND CUSHIONS______ with feet shoulder width apart and arms AT YOUR SIDE. EYE OPEN. Hold__30__ seconds. Repeat __3__ times per session. Do _1___ sessions per day.  Copyright  VHI. All rights reserved.

## 2019-05-27 NOTE — Therapy (Signed)
[facility] [address] [place], [place], [address] Phone: [phone]   Fax:  [phone]  Physical Therapy Treatment  Patient Details  Name: Amanda Cannon MRN: [id number] Date of Birth: [birth year] Referring Provider (PT): Leta Baptist, MD   Encounter Date: 05/27/2019  PT End of Session - 05/27/19 1810    Visit Number  4    Number of Visits  9    Date for PT Re-Evaluation  06/14/19    Authorization Type  Medicare    Authorization Time Period  05-09-19 - 08-06-19    PT Start Time  1705    PT Stop Time  1748    PT Time Calculation (min)  43 min    Activity Tolerance  Patient tolerated treatment well    Behavior During Therapy  [facility] for tasks assessed/performed       Past Medical History:  Diagnosis Date  . Alcohol use   . Anemia   . Anxiety   . Asthma   . Basal cell carcinoma    forehead  . Breast cancer ([place]) 1998   left   . Colon polyps   . GERD (gastroesophageal reflux disease)   . Glaucoma   . Hyperlipidemia   . Hypertension   . Hyponatremia    remote, mild  . Mitral valve prolapse   . Osteoarthritis    sees rheumatologist  . Osteoporosis    sees rheumatologist  . Personal history of radiation therapy 1998  . PMR (polymyalgia rheumatica) (HCC)    sees rheumatologist  . Pneumonia   . Prinzmetal angina ([place])   . White matter changes    ? ischemic sm vs per pt    Past Surgical History:  Procedure Laterality Date  . ABDOMINAL HYSTERECTOMY    . BREAST LUMPECTOMY Left 1998   cancer  . CESAREAN SECTION     x 4  . COLONOSCOPY  07/2012   in [place]  . HERNIA REPAIR  2005   abdominal hernia repair  . HYSTEROTOMY    . LYMPH NODE DISSECTION Left   . POLYPECTOMY      There were no vitals filed for this visit.                    [facility] PT Treatment/Exercise - 05/27/19 1805      Therapeutic Activites    Therapeutic Activities  Other Therapeutic Activities    Other Therapeutic  Activities  Pt requested that therapist read report from ENT and explain what the report means.  Reviewed and attempted to explain findings in more patient friendly language and explained function of otolithic organs, specifically the saccule. Pt verbalized understanding.        Vestibular Treatment/Exercise - 05/27/19 1732      Vestibular Treatment/Exercise   Vestibular Treatment Provided  Gaze    Habituation Exercises  Comment;Seated Vertical Head Turns;Seated Horizontal Head Turns    Gaze Exercises  X1 Viewing Horizontal;X1 Viewing Vertical      Seated Horizontal Head Turns   Number of Reps   3    Symptom Description   seated on physioball performed bouncing for otolithic organ stimulation x 30 seconds with EO, EC and then with EO with head turns to L and R x 3 reps each      Seated Vertical Head Turns   Number of Reps   3    Symptom Description   while performing bouncing on physioball, 3 reps each up and down  X1 Viewing Horizontal   Foot Position  standing, added chair for UE support due to body moving with head and increased wooziness    Reps  3    Comments  decreased time to 15-20 seconds due to increased symptoms      X1 Viewing Vertical   Foot Position  standing, added chair for UE support due to body moving with head and increased wooziness    Reps  3    Comments  decreased time to 15-20 seconds due to increased symptoms            PT Education - 05/27/19 1809    Education Details  see TA; reminded pt to hold back of chair for x1 viewing due to increased body movements and decreased to 15 repetitions due to significant increase in symptoms.  No change to corner balance    Person(s) Educated  Patient    Methods  Explanation;Demonstration;Handout    Comprehension  Verbalized understanding;Returned demonstration      Gaze Stabilization - Tip Card  1.Target must remain in focus, not blurry, and appear stationary while head is in motion. 2.Perform exercises with  small head movements (45 to either side of midline). 3.Increase speed of head motion so long as target is in focus. 4.If you wear eyeglasses, be sure you can see target through lens (therapist will give specific instructions for bifocal / progressive lenses). 5.These exercises may provoke dizziness or nausea. Work through these symptoms. If too dizzy, slow head movement slightly. Rest between each exercise. 6.Exercises demand concentration; avoid distractions. 7.For safety, perform standing exercises close to a counter, wall, corner, or next to someone.  Copyright  VHI. All rights reserved.   Gaze Stabilization - Standing Feet Apart   Feet shoulder width apart, keeping eyes on target on wall 3 feet away, tilt head down slightly and move head side to side for 15 repetitions.  Repeat while moving head up and down for 15 repetitions.  Do 2-3 sessions per day.   PERFORM IN A CORNER WITH CHAIR IN FRONT OF YOU OR AT KITCHEN SINK WITH CHAIR BEHIND YOU FOR SAFETY: Feet Apart, Head Motion - Eyes Open    With eyes open, feet apart, move head slowly: up and down 10 TIMES AND SIDE TO SIDE 10 TIMES. Repeat __3__ times per session. Do __1__ sessions per day.  Copyright  VHI. All rights reserved.  Feet Apart (Compliant Surface) Varied Arm Positions - Eyes OPEN    Stand on compliant surface: __PILLOWS AND CUSHIONS______ with feet shoulder width apart and arms AT YOUR SIDE. EYE OPEN. Hold__30__ seconds. Repeat __3__ times per session. Do _1___ sessions per day.  Copyright  VHI. All rights reserved.       PT Long Term Goals - 05/24/19 1029      PT LONG TERM GOAL #1   Title  Improve DHI score from 52% to </= 40% to indicate improvement in dizziness for improved quality of ife.    Baseline  52%- 05-09-19    Time  4    Period  Weeks    Status  New      PT LONG TERM GOAL #2   Title  Increase FGA score by at least 4 points to demo improved balance during gait for incr. safety.     Baseline  23/30 on 05/20/19    Time  4    Period  Weeks    Status  New      PT LONG TERM GOAL #  3   Title  Improve DVA to </= 2 line difference for improved gaze stabilization.    Baseline  4 line difference on 05-09-19    Time  4    Period  Weeks    Status  New      PT LONG TERM GOAL #4   Title  Independent in HEP for balance and vestibular exercises.    Time  4    Period  Weeks    Status  New            Plan - 05/27/19 1811    Clinical Impression Statement  Treatment session focused on continued education regarding specific vestibular, balance and postural control impairments.  Continued to review and adjust gaze adaptation due to pt symptoms.  Incorporated bouncing on ball for otolithic organ stimulation with therapist providing min-mod A for balance and postural control.  Will continue to progress towards LTG.    Personal Factors and Comorbidities  Comorbidity 2;Past/Current Experience;Time since onset of injury/illness/exacerbation    Comorbidities  trigeminal neuropathy, polymyalgia rheumatica, HTN, depression, arthritis, mitral valve prolapse; mild inferior abrasion in Rt external auditory canal per audiologist's report from 04-03-19 visit:  tinnitus, motion sickness, glaaucoma, sensorineural hearing loss, h/o breast cancer    Examination-Activity Limitations  Locomotion Level;Transfers;Bend;Stairs;Squat;Stand    Examination-Participation Restrictions  Meal Prep;Cleaning;Community Activity;Driving;Shop    Stability/Clinical Decision Making  Evolving/Moderate complexity    Rehab Potential  Good    PT Frequency  2x / week    PT Duration  4 weeks    PT Treatment/Interventions  ADLs/Self Care Home Management;Vestibular;Gait training;Therapeutic activities;Therapeutic exercise;Balance training;Neuromuscular re-education;Patient/family education    PT Next Visit Plan  Progress x1 viewing - increase time and decrease UE support when appropriate.  Bouncing on physioball EO, EC, head  turns/nods; balance on foam with EO and EC dynamic balance/gait activities - any activity to increase vestibular input in maintaining balance    PT Home Exercise Plan  x1 viewng and corner balance; progressed x1 viewing to standing position and added marching on pillows to HEP on 05-23-19    Consulted and Agree with Plan of Care  Patient       Patient will benefit from skilled therapeutic intervention in order to improve the following deficits and impairments:  Difficulty walking, Decreased balance, Impaired vision/preception  Visit Diagnosis: Other abnormalities of gait and mobility  Unsteadiness on feet  Dizziness and giddiness     Problem List Patient Active Problem List   Diagnosis Date Noted  . Trigeminal neuropathy 11/13/2017  . PMR (polymyalgia rheumatica) (HCC) 04/24/2017  . Hyponatremia 07/15/2015  . Hyperlipidemia 07/15/2015  . Esophageal spasm 07/15/2015  . Essential hypertension 07/15/2015  . Depression 07/15/2015  . Asthma 07/15/2015  . Tinnitus 07/15/2015  . Osteoporosis 07/15/2015  . Spasm   . Prinzmetal angina ([place])   . Mitral valve prolapse   . White matter changes     Rico Junker, PT, DPT 05/27/19    6:16 PM    [facility] [address] [place], [place], [address] Phone: [phone]   Fax:  [phone]  Name: Amanda Cannon MRN: [id number] Date of Birth: 1945-07-21

## 2019-05-29 ENCOUNTER — Encounter: Payer: Self-pay | Admitting: Family Medicine

## 2019-05-29 ENCOUNTER — Ambulatory Visit (INDEPENDENT_AMBULATORY_CARE_PROVIDER_SITE_OTHER): Payer: Medicare Other | Admitting: Family Medicine

## 2019-05-29 ENCOUNTER — Other Ambulatory Visit: Payer: Self-pay

## 2019-05-29 VITALS — BP 102/64 | HR 67 | Temp 97.7°F | Wt 100.5 lb

## 2019-05-29 DIAGNOSIS — E7849 Other hyperlipidemia: Secondary | ICD-10-CM

## 2019-05-29 DIAGNOSIS — G509 Disorder of trigeminal nerve, unspecified: Secondary | ICD-10-CM | POA: Diagnosis not present

## 2019-05-29 DIAGNOSIS — M353 Polymyalgia rheumatica: Secondary | ICD-10-CM

## 2019-05-29 DIAGNOSIS — E871 Hypo-osmolality and hyponatremia: Secondary | ICD-10-CM | POA: Diagnosis not present

## 2019-05-29 DIAGNOSIS — J4520 Mild intermittent asthma, uncomplicated: Secondary | ICD-10-CM

## 2019-05-29 DIAGNOSIS — M542 Cervicalgia: Secondary | ICD-10-CM

## 2019-05-29 DIAGNOSIS — I10 Essential (primary) hypertension: Secondary | ICD-10-CM | POA: Diagnosis not present

## 2019-05-29 NOTE — Patient Instructions (Signed)
Hip Bursitis  Hip bursitis is inflammation of a fluid-filled sac (bursa) in the hip joint. The bursa prevents the bones in the hip joint from rubbing against each other. Hip bursitis can cause mild to moderate pain, and symptoms often come and go over time. What are the causes? This condition may be caused by:  Injury to the hip.  Overuse of the muscles that surround the hip joint.  Previous injury or surgery of the hip.  Arthritis or gout.  Diabetes.  Thyroid disease.  Infection. In some cases, the cause may not be known. What are the signs or symptoms? Symptoms of this condition include:  Mild or moderate pain in the hip area. Pain may get worse with movement.  Tenderness and swelling of the hip, especially on the outer side of the hip.  In rare cases, the bursa may become infected. This may cause a fever, as well as warmth and redness in the area. Symptoms may come and go. How is this diagnosed? This condition may be diagnosed based on:  A physical exam.  Your medical history.  X-rays.  Removal of fluid from your inflamed bursa for testing (biopsy). You may be sent to a health care provider who specializes in bone diseases (orthopedist) or a provider who specializes in joint inflammation (rheumatologist). How is this treated? This condition is treated by resting, icing, applying pressure (compression), and raising (elevating) the injured area. This is called RICE treatment. In some cases, this may be enough to make your symptoms go away. Treatment may also include:  Using crutches.  Draining fluid out of the bursa to help relieve swelling.  Injecting medicine that helps to reduce inflammation (cortisone).  Additional medicines if the bursa is infected. Follow these instructions at home: Managing pain, stiffness, and swelling   If directed, put ice on the painful area. ? Put ice in a plastic bag. ? Place a towel between your skin and the bag. ? Leave the ice  on for 20 minutes, 2-3 times a day. ? Raise (elevate) your hip above the level of your heart as much as you can without pain. To do this, try putting a pillow under your hips while you lie down. Activity  Return to your normal activities as told by your health care provider. Ask your health care provider what activities are safe for you.  Rest and protect your hip as much as possible until your pain and swelling get better. General instructions  Take over-the-counter and prescription medicines only as told by your health care provider.  Wear compression wraps only as told by your health care provider.  Do not use your hip to support your body weight until your health care provider says that you can. Use crutches as told by your health care provider.  Gently massage and stretch your injured area as often as is comfortable.  Keep all follow-up visits as told by your health care provider. This is important. How is this prevented?  Exercise regularly, as told by your health care provider.  Warm up and stretch before being active.  Cool down and stretch after being active.  If an activity irritates your hip or causes pain, avoid the activity as much as possible.  Avoid sitting down for long periods at a time. Contact a health care provider if you:  Have a fever.  Develop new symptoms.  Have difficulty walking or doing everyday activities.  Have pain that gets worse or does not get better with medicine.  Develop red skin or a feeling of warmth in your hip area. Get help right away if you:  Cannot move your hip.  Have severe pain. Summary  Hip bursitis is inflammation of a fluid-filled sac (bursa) in the hip joint.  Hip bursitis can cause mild to moderate pain, and symptoms often come and go over time.  This condition is treated with rest, ice, compression, elevation, and medicines. This information is not intended to replace advice given to you by your health care  provider. Make sure you discuss any questions you have with your health care provider. Document Revised: 09/18/2017 Document Reviewed: 09/18/2017 Elsevier Patient Education  2020 [employer]  Hip Bursitis Rehab Ask your health care provider which exercises are safe for you. Do exercises exactly as told by your health care provider and adjust them as directed. It is normal to feel mild stretching, pulling, tightness, or discomfort as you do these exercises. Stop right away if you feel sudden pain or your pain gets worse. Do not begin these exercises until told by your health care provider. Stretching exercise This exercise warms up your muscles and joints and improves the movement and flexibility of your hip. This exercise also helps to relieve pain and stiffness. Iliotibial band stretch An iliotibial band is a strong band of muscle tissue that runs from the outer side of your hip to the outer side of your thigh and knee. 1. Lie on your side with your left / right leg in the top position. 2. Bend your left / right knee and grab your ankle. Stretch out your bottom arm to help you balance. 3. Slowly bring your knee back so your thigh is behind your body. 4. Slowly lower your knee toward the floor until you feel a gentle stretch on the outside of your left / right thigh. If you do not feel a stretch and your knee will not fall farther, place the heel of your other foot on top of your knee and pull your knee down toward the floor with your foot. 5. Hold this position for __________ seconds. 6. Slowly return to the starting position. Repeat __________ times. Complete this exercise __________ times a day. Strengthening exercises These exercises build strength and endurance in your hip and pelvis. Endurance is the ability to use your muscles for a long time, even after they get tired. Bridge This exercise strengthens the muscles that move your thigh backward (hip extensors). 1. Lie on your back on a  firm surface with your knees bent and your feet flat on the floor. 2. Tighten your buttocks muscles and lift your buttocks off the floor until your trunk is level with your thighs. ? Do not arch your back. ? You should feel the muscles working in your buttocks and the back of your thighs. If you do not feel these muscles, slide your feet 1-2 inches (2.5-5 cm) farther away from your buttocks. ? If this exercise is too easy, try doing it with your arms crossed over your chest. 3. Hold this position for __________ seconds. 4. Slowly lower your hips to the starting position. 5. Let your muscles relax completely after each repetition. Repeat __________ times. Complete this exercise __________ times a day. Squats This exercise strengthens the muscles in front of your thigh and knee (quadriceps). 1. Stand in front of a table, with your feet and knees pointing straight ahead. You may rest your hands on the table for balance but not for support. 2. Slowly bend your   knees and lower your hips like you are going to sit in a chair. ? Keep your weight over your heels, not over your toes. ? Keep your lower legs upright so they are parallel with the table legs. ? Do not let your hips go lower than your knees. ? Do not bend lower than told by your health care provider. ? If your hip pain increases, do not bend as low. 3. Hold the squat position for __________ seconds. 4. Slowly push with your legs to return to standing. Do not use your hands to pull yourself to standing. Repeat __________ times. Complete this exercise __________ times a day. Hip hike 1. Stand sideways on a bottom step. Stand on your left / right leg with your other foot unsupported next to the step. You can hold on to the railing or wall for balance if needed. 2. Keep your knees straight and your torso square. Then lift your left / right hip up toward the ceiling. 3. Hold this position for __________ seconds. 4. Slowly let your left / right  hip lower toward the floor, past the starting position. Your foot should get closer to the floor. Do not lean or bend your knees. Repeat __________ times. Complete this exercise __________ times a day. Single leg stand 1. Without shoes, stand near a railing or in a doorway. You may hold on to the railing or door frame as needed for balance. 2. Squeeze your left / right buttock muscles, then lift up your other foot. ? Do not let your left / right hip push out to the side. ? It is helpful to stand in front of a mirror for this exercise so you can watch your hip. 3. Hold this position for __________ seconds. Repeat __________ times. Complete this exercise __________ times a day. This information is not intended to replace advice given to you by your health care provider. Make sure you discuss any questions you have with your health care provider. Document Revised: 05/07/2018 Document Reviewed: 05/07/2018 Elsevier Patient Education  [employer].  Hip Bursitis Rehab Ask your health care provider which exercises are safe for you. Do exercises exactly as told by your health care provider and adjust them as directed. It is normal to feel mild stretching, pulling, tightness, or discomfort as you do these exercises. Stop right away if you feel sudden pain or your pain gets worse. Do not begin these exercises until told by your health care provider. Stretching exercise This exercise warms up your muscles and joints and improves the movement and flexibility of your hip. This exercise also helps to relieve pain and stiffness. Iliotibial band stretch An iliotibial band is a strong band of muscle tissue that runs from the outer side of your hip to the outer side of your thigh and knee. 7. Lie on your side with your left / right leg in the top position. 8. Bend your left / right knee and grab your ankle. Stretch out your bottom arm to help you balance. 9. Slowly bring your knee back so your thigh is  behind your body. 10. Slowly lower your knee toward the floor until you feel a gentle stretch on the outside of your left / right thigh. If you do not feel a stretch and your knee will not fall farther, place the heel of your other foot on top of your knee and pull your knee down toward the floor with your foot. 11. Hold this position for __________ seconds. 12.  Slowly return to the starting position. Repeat __________ times. Complete this exercise __________ times a day. Strengthening exercises These exercises build strength and endurance in your hip and pelvis. Endurance is the ability to use your muscles for a long time, even after they get tired. Bridge This exercise strengthens the muscles that move your thigh backward (hip extensors). 6. Lie on your back on a firm surface with your knees bent and your feet flat on the floor. 7. Tighten your buttocks muscles and lift your buttocks off the floor until your trunk is level with your thighs. ? Do not arch your back. ? You should feel the muscles working in your buttocks and the back of your thighs. If you do not feel these muscles, slide your feet 1-2 inches (2.5-5 cm) farther away from your buttocks. ? If this exercise is too easy, try doing it with your arms crossed over your chest. 8. Hold this position for __________ seconds. 9. Slowly lower your hips to the starting position. 10. Let your muscles relax completely after each repetition. Repeat __________ times. Complete this exercise __________ times a day. Squats This exercise strengthens the muscles in front of your thigh and knee (quadriceps). 5. Stand in front of a table, with your feet and knees pointing straight ahead. You may rest your hands on the table for balance but not for support. 6. Slowly bend your knees and lower your hips like you are going to sit in a chair. ? Keep your weight over your heels, not over your toes. ? Keep your lower legs upright so they are parallel with  the table legs. ? Do not let your hips go lower than your knees. ? Do not bend lower than told by your health care provider. ? If your hip pain increases, do not bend as low. 7. Hold the squat position for __________ seconds. 8. Slowly push with your legs to return to standing. Do not use your hands to pull yourself to standing. Repeat __________ times. Complete this exercise __________ times a day. Hip hike 5. Stand sideways on a bottom step. Stand on your left / right leg with your other foot unsupported next to the step. You can hold on to the railing or wall for balance if needed. 6. Keep your knees straight and your torso square. Then lift your left / right hip up toward the ceiling. 7. Hold this position for __________ seconds. 8. Slowly let your left / right hip lower toward the floor, past the starting position. Your foot should get closer to the floor. Do not lean or bend your knees. Repeat __________ times. Complete this exercise __________ times a day. Single leg stand 4. Without shoes, stand near a railing or in a doorway. You may hold on to the railing or door frame as needed for balance. 5. Squeeze your left / right buttock muscles, then lift up your other foot. ? Do not let your left / right hip push out to the side. ? It is helpful to stand in front of a mirror for this exercise so you can watch your hip. 6. Hold this position for __________ seconds. Repeat __________ times. Complete this exercise __________ times a day. This information is not intended to replace advice given to you by your health care provider. Make sure you discuss any questions you have with your health care provider. Document Revised: 05/07/2018 Document Reviewed: 05/07/2018 Elsevier Patient Education  [place].  Hip Bursitis Rehab Ask your health care  provider which exercises are safe for you. Do exercises exactly as told by your health care provider and adjust them as directed. It is normal  to feel mild stretching, pulling, tightness, or discomfort as you do these exercises. Stop right away if you feel sudden pain or your pain gets worse. Do not begin these exercises until told by your health care provider. Stretching exercise This exercise warms up your muscles and joints and improves the movement and flexibility of your hip. This exercise also helps to relieve pain and stiffness. Iliotibial band stretch An iliotibial band is a strong band of muscle tissue that runs from the outer side of your hip to the outer side of your thigh and knee. 92. Lie on your side with your left / right leg in the top position. Lane your left / right knee and grab your ankle. Stretch out your bottom arm to help you balance. 15. Slowly bring your knee back so your thigh is behind your body. 16. Slowly lower your knee toward the floor until you feel a gentle stretch on the outside of your left / right thigh. If you do not feel a stretch and your knee will not fall farther, place the heel of your other foot on top of your knee and pull your knee down toward the floor with your foot. 17. Hold this position for __________ seconds. 18. Slowly return to the starting position. Repeat __________ times. Complete this exercise __________ times a day. Strengthening exercises These exercises build strength and endurance in your hip and pelvis. Endurance is the ability to use your muscles for a long time, even after they get tired. Bridge This exercise strengthens the muscles that move your thigh backward (hip extensors). 11. Lie on your back on a firm surface with your knees bent and your feet flat on the floor. 12. Tighten your buttocks muscles and lift your buttocks off the floor until your trunk is level with your thighs. ? Do not arch your back. ? You should feel the muscles working in your buttocks and the back of your thighs. If you do not feel these muscles, slide your feet 1-2 inches (2.5-5 cm) farther  away from your buttocks. ? If this exercise is too easy, try doing it with your arms crossed over your chest. 13. Hold this position for __________ seconds. 14. Slowly lower your hips to the starting position. 15. Let your muscles relax completely after each repetition. Repeat __________ times. Complete this exercise __________ times a day. Squats This exercise strengthens the muscles in front of your thigh and knee (quadriceps). 9. Stand in front of a table, with your feet and knees pointing straight ahead. You may rest your hands on the table for balance but not for support. 10. Slowly bend your knees and lower your hips like you are going to sit in a chair. ? Keep your weight over your heels, not over your toes. ? Keep your lower legs upright so they are parallel with the table legs. ? Do not let your hips go lower than your knees. ? Do not bend lower than told by your health care provider. ? If your hip pain increases, do not bend as low. 11. Hold the squat position for __________ seconds. 12. Slowly push with your legs to return to standing. Do not use your hands to pull yourself to standing. Repeat __________ times. Complete this exercise __________ times a day. Hip hike 9. Stand sideways on a bottom step. Stand  on your left / right leg with your other foot unsupported next to the step. You can hold on to the railing or wall for balance if needed. 10. Keep your knees straight and your torso square. Then lift your left / right hip up toward the ceiling. 11. Hold this position for __________ seconds. 12. Slowly let your left / right hip lower toward the floor, past the starting position. Your foot should get closer to the floor. Do not lean or bend your knees. Repeat __________ times. Complete this exercise __________ times a day. Single leg stand 7. Without shoes, stand near a railing or in a doorway. You may hold on to the railing or door frame as needed for balance. 8. Squeeze your  left / right buttock muscles, then lift up your other foot. ? Do not let your left / right hip push out to the side. ? It is helpful to stand in front of a mirror for this exercise so you can watch your hip. 9. Hold this position for __________ seconds. Repeat __________ times. Complete this exercise __________ times a day. This information is not intended to replace advice given to you by your health care provider. Make sure you discuss any questions you have with your health care provider. Document Revised: 05/07/2018 Document Reviewed: 05/07/2018 Elsevier Patient Education  [place].

## 2019-05-29 NOTE — Progress Notes (Signed)
Amanda Cannon DOB: October 14, 1945 Encounter date: 05/29/2019  This is a 74 y.o. female who presents with Chief Complaint  Patient presents with  . Follow-up    History of present illness:   Hypertension/mitral valve prolapse/hyperlipidemia: follows with Dr. Tamala Julian. On praluent for cholesterol control with good tolerability and most recent LDL last month of 85.   Chronic paresthesias:  Has been found now to have a vestibular disorder. Has done 3-4 sessions so far. Cooking was big issue for her - difficulty with turning head, bending down. Still with tinnitus.   Hx PMR/osteoporosis/arthritis: on prolia, seeing Dr. Amil Amen. Doing well with this.  Chronic rhinitis/cough: has some sneezing.   Hx Breast ca: -1998 -neg bracaper her report -s/p partial mastectomy L breast, hysterectomyand oophorectomy -after cancer, tamoxifen, LN dissection then they suggested ovaries to be removed.   Arthritis: shoulder, legs. Doesn't slow her down much.  Doesn't have as much appetite these days. She did want to lose a few pounds and is happy with current weight. They are trying to eat healthier  Allergies  Allergen Reactions  . Tylenol [Acetaminophen] Cough  . Turmeric     Lowers BP  . Erythromycin     Stomach cramps  . Erythromycin Base Other (See Comments)    Stomach issues  . Garlic Swelling    GI upset  . Other     Beta-blockers-patient states it makes her crazy  . Sulfa Antibiotics Other (See Comments)    Doesn't recall   Current Meds  Medication Sig  . Alirocumab (PRALUENT) 150 MG/ML SOAJ Inject 1 pen into the skin every 14 (fourteen) days.  Marland Kitchen amLODipine (NORVASC) 5 MG tablet TAKE 1 TABLET BY MOUTH EVERY DAY  . aspirin 81 MG tablet Take 81 mg by mouth daily.  . cetirizine (ZYRTEC) 10 MG tablet Take 10 mg by mouth 2 (two) times daily.   Marland Kitchen CLINPRO 5000 1.1 % PSTE See admin instructions.  Marland Kitchen denosumab (PROLIA) 60 MG/ML SOLN injection Inject 60 mg into the skin every 6 (six)  months. Administer in upper arm, thigh, or abdomen  . famotidine (PEPCID) 40 MG tablet Take 1 tablet (40 mg total) by mouth daily.  . Fluticasone Propionate (FLONASE NA)   . furosemide (LASIX) 20 MG tablet TAKE 1/2 TABLET (10MG       TOTAL) 3 TIMES A WEEK (Patient taking differently: 20 mg daily. )  . LORazepam (ATIVAN) 1 MG tablet Take 1 mg by mouth at bedtime as needed (spasms).  Marland Kitchen MAGNESIUM PO Take 400 mg by mouth daily.   . pantoprazole (PROTONIX) 20 MG tablet Take 1 tablet (20 mg total) by mouth daily.  . valsartan (DIOVAN) 160 MG tablet TAKE 1/2 TABLET BY MOUTH DAILY    Review of Systems  Constitutional: Negative for chills, fatigue and fever.  Respiratory: Negative for cough, chest tightness, shortness of breath and wheezing.   Cardiovascular: Negative for chest pain, palpitations and leg swelling.  Musculoskeletal: Positive for arthralgias.    Objective:  BP 102/64 (BP Location: Right Arm, Patient Position: Sitting, Cuff Size: Normal)   Pulse 67   Temp 97.7 F (36.5 C) (Temporal)   Wt 100 lb 8 oz (45.6 kg)   BMI 22.45 kg/m   Weight: 100 lb 8 oz (45.6 kg)   BP Readings from Last 3 Encounters:  05/29/19 102/64  05/23/19 128/86  02/12/19 122/78   Wt Readings from Last 3 Encounters:  05/29/19 100 lb 8 oz (45.6 kg)  02/12/19 105 lb 9.6 oz (47.9 kg)  01/22/19 103 lb (46.7 kg)    Physical Exam Constitutional:      General: She is not in acute distress.    Appearance: She is well-developed.  Cardiovascular:     Rate and Rhythm: Normal rate and regular rhythm.     Heart sounds: Normal heart sounds. No murmur. No friction rub.  Pulmonary:     Effort: Pulmonary effort is normal. No respiratory distress.     Breath sounds: Normal breath sounds. No wheezing or rales.  Musculoskeletal:     Right lower leg: No edema.     Left lower leg: No edema.     Comments: Significant trapezius tension bilateral shoulders.   Right greater trochanteric bursa tenderness.  Neurological:      Mental Status: She is alert and oriented to person, place, and time.  Psychiatric:        Behavior: Behavior normal.     Assessment/Plan  1. Essential hypertension Stable. Continue current medications.  2. Mild intermittent asthma without complication Has been stable. Allergies are well controlled.  3. Trigeminal neuropathy Now thought to be related to vestibular issues. She is in therapy for this currently.  4. Hyponatremia Has been stable.  5. Other hyperlipidemia Great control on current Praluent.  6. PMR (polymyalgia rheumatica) (HCC) Follows with rheumatology regularly. Has been stable.  7. Neck pain She is interested in massage therapy. She is can look into coverage for this. Alternatively, we could send her to sports medicine for osteopathic treatment. - Ambulatory referral to Physical Therapy   Return in about 6 months (around 11/29/2019) for Chronic condition visit.    Micheline Rough, MD

## 2019-05-30 ENCOUNTER — Encounter: Payer: Self-pay | Admitting: Physical Therapy

## 2019-05-30 ENCOUNTER — Ambulatory Visit: Payer: Medicare Other | Admitting: Physical Therapy

## 2019-05-30 ENCOUNTER — Encounter: Payer: Self-pay | Admitting: Family Medicine

## 2019-05-30 ENCOUNTER — Other Ambulatory Visit: Payer: Self-pay | Admitting: Family Medicine

## 2019-05-30 DIAGNOSIS — R2681 Unsteadiness on feet: Secondary | ICD-10-CM | POA: Diagnosis not present

## 2019-05-30 DIAGNOSIS — R2689 Other abnormalities of gait and mobility: Secondary | ICD-10-CM

## 2019-05-30 DIAGNOSIS — R42 Dizziness and giddiness: Secondary | ICD-10-CM | POA: Diagnosis not present

## 2019-05-31 NOTE — Therapy (Signed)
[facility] [address] [place], [place], [address] Phone: [phone]   Fax:  [phone]  Physical Therapy Treatment  Patient Details  Name: Amanda Cannon MRN: [id number] Date of Birth: 1945-12-20 Referring Provider (PT): Leta Baptist, MD   Encounter Date: 05/30/2019  PT End of Session - 05/31/19 1249    Visit Number  5    Number of Visits  9    Date for PT Re-Evaluation  06/14/19    Authorization Type  Medicare    Authorization Time Period  05-09-19 - 08-06-19    PT Start Time  1447    PT Stop Time  1531    PT Time Calculation (min)  44 min    Activity Tolerance  Patient tolerated treatment well    Behavior During Therapy  [facility] for tasks assessed/performed       Past Medical History:  Diagnosis Date  . Alcohol use   . Anemia   . Anxiety   . Asthma   . Basal cell carcinoma    forehead  . Breast cancer ([place]) 1998   left   . Colon polyps   . GERD (gastroesophageal reflux disease)   . Glaucoma   . Hyperlipidemia   . Hypertension   . Hyponatremia    remote, mild  . Mitral valve prolapse   . Osteoarthritis    sees rheumatologist  . Osteoporosis    sees rheumatologist  . Personal history of radiation therapy 1998  . PMR (polymyalgia rheumatica) (HCC)    sees rheumatologist  . Pneumonia   . Prinzmetal angina ([place])   . White matter changes    ? ischemic sm vs per pt    Past Surgical History:  Procedure Laterality Date  . ABDOMINAL HYSTERECTOMY    . BREAST LUMPECTOMY Left 1998   cancer  . CESAREAN SECTION     x 4  . COLONOSCOPY  07/2012   in [place]  . HERNIA REPAIR  2005   abdominal hernia repair  . HYSTEROTOMY    . LYMPH NODE DISSECTION Left   . POLYPECTOMY      There were no vitals filed for this visit.  Subjective Assessment - 05/31/19 1242    Subjective  Pt states she thinks her balance may be a little better; pt reports she mentioned her neck tightness to her PCP and she ordered PT for her  neck at [facility] clinic    Pertinent History  bilateral symmetric sensorineural hearing loss:  bilateral occasional tinnitus; motion sickness; glaucoma; cataract lenses; HTN: osteoporosis; arthritis; trigeminal neuropathy:  polymyalgia rheumatica:  anxiety/depression    Patient Stated Goals  improve balance    Currently in Pain?  No/denies                       [facility] Adult PT Treatment/Exercise - 05/31/19 0001      Transfers   Transfers  Sit to Stand    Number of Reps  Other reps (comment)   5   Comments  feet on blue mat; minimal UE suppport used with transfer      Neuro Re-ed    Neuro Re-ed Details   pt performed bouncing up/down on red physioball for otolith stimulation - EO and EC with iniitally no head movement, progressing to slow horizontal head turns and then vertical head turns 5 reps each         Vestibular Treatment/Exercise - 05/31/19 0001      Vestibular Treatment/Exercise  Vestibular Treatment Provided  Gaze      X1 Viewing Horizontal   Foot Position  bil. stance    Time  --   30 secs   Reps  1    Comments  cues for correct technique      X1 Viewing Vertical   Foot Position  bil. stance    Time  --   30 secs   Reps  1         Balance Exercises - 05/31/19 1247      Balance Exercises: Standing   Standing Eyes Opened  Wide (BOA);Head turns;Foam/compliant surface;5 reps    Standing Eyes Closed  Wide (BOA);Head turns;Foam/compliant surface;5 reps    Rockerboard  Anterior/posterior;Head turns;EO;EC;10 reps;UE support             PT Long Term Goals - 05/31/19 1254      PT LONG TERM GOAL #1   Title  Improve DHI score from 52% to </= 40% to indicate improvement in dizziness for improved quality of ife.    Baseline  52%- 05-09-19    Time  4    Period  Weeks    Status  New      PT LONG TERM GOAL #2   Title  Increase FGA score by at least 4 points to demo improved balance during gait for incr. safety.    Baseline  23/30 on 05/20/19     Time  4    Period  Weeks    Status  New      PT LONG TERM GOAL #3   Title  Improve DVA to </= 2 line difference for improved gaze stabilization.    Baseline  4 line difference on 05-09-19    Time  4    Period  Weeks    Status  New      PT LONG TERM GOAL #4   Title  Independent in HEP for balance and vestibular exercises.    Time  4    Period  Weeks    Status  New            Plan - 05/31/19 1250    Clinical Impression Statement  Pt continues to need cues for correct performance of gaze stabilization exercise to keep eyes focused on target.  Pt able to maintain balance significantly better with no head movement involved compared to moving with head turns incorporated.  Pt continues to c/o unsteadiness/ "discombobulated" feeling during activities requiring incr. vestibualr input, rather than true c/o dizziness.    Personal Factors and Comorbidities  Comorbidity 2;Past/Current Experience;Time since onset of injury/illness/exacerbation    Comorbidities  trigeminal neuropathy, polymyalgia rheumatica, HTN, depression, arthritis, mitral valve prolapse; mild inferior abrasion in Rt external auditory canal per audiologist's report from 04-03-19 visit:  tinnitus, motion sickness, glaaucoma, sensorineural hearing loss, h/o breast cancer    Examination-Activity Limitations  Locomotion Level;Transfers;Bend;Stairs;Squat;Stand    Examination-Participation Restrictions  Meal Prep;Cleaning;Community Activity;Driving;Shop    Stability/Clinical Decision Making  Evolving/Moderate complexity    Rehab Potential  Good    PT Frequency  2x / week    PT Duration  4 weeks    PT Treatment/Interventions  ADLs/Self Care Home Management;Vestibular;Gait training;Therapeutic activities;Therapeutic exercise;Balance training;Neuromuscular re-education;Patient/family education    PT Next Visit Plan  Progress x1 viewing - increase time and decrease UE support when appropriate.  Bouncing on physioball EO, EC, head  turns/nods; balance on foam with EO and EC dynamic balance/gait activities - any activity to increase vestibular input  in maintaining balance    PT Home Exercise Plan  x1 viewng and corner balance; progressed x1 viewing to standing position and added marching on pillows to HEP on 05-23-19    Consulted and Agree with Plan of Care  Patient       Patient will benefit from skilled therapeutic intervention in order to improve the following deficits and impairments:  Difficulty walking, Decreased balance, Impaired vision/preception  Visit Diagnosis: Other abnormalities of gait and mobility  Unsteadiness on feet     Problem List Patient Active Problem List   Diagnosis Date Noted  . Trigeminal neuropathy 11/13/2017  . PMR (polymyalgia rheumatica) (HCC) 04/24/2017  . Hyponatremia 07/15/2015  . Hyperlipidemia 07/15/2015  . Esophageal spasm 07/15/2015  . Essential hypertension 07/15/2015  . Depression 07/15/2015  . Asthma 07/15/2015  . Tinnitus 07/15/2015  . Osteoporosis 07/15/2015  . Spasm   . Prinzmetal angina ([place])   . Mitral valve prolapse   . White matter changes     Amanda Cannon, [place], PT 05/31/2019, 12:56 PM  [facility] [address] [place], [place], [address] Phone: [phone]   Fax:  [phone]  Name: Amanda Cannon MRN: [id number] Date of Birth: 04-25-1945

## 2019-06-03 ENCOUNTER — Other Ambulatory Visit: Payer: Self-pay

## 2019-06-03 ENCOUNTER — Ambulatory Visit: Payer: Medicare Other | Admitting: Physical Therapy

## 2019-06-03 ENCOUNTER — Encounter: Payer: Self-pay | Admitting: Physical Therapy

## 2019-06-03 DIAGNOSIS — R42 Dizziness and giddiness: Secondary | ICD-10-CM | POA: Diagnosis not present

## 2019-06-03 DIAGNOSIS — R2689 Other abnormalities of gait and mobility: Secondary | ICD-10-CM | POA: Diagnosis not present

## 2019-06-03 DIAGNOSIS — R2681 Unsteadiness on feet: Secondary | ICD-10-CM

## 2019-06-03 NOTE — Therapy (Signed)
[facility] [address] [place], [place], [address] Phone: [phone]   Fax:  [phone]  Physical Therapy Treatment  Patient Details  Name: Amanda Cannon MRN: [id number] Date of Birth: Oct 21, 1945 Referring Provider (PT): Leta Baptist, MD   Encounter Date: 06/03/2019  PT End of Session - 06/03/19 2108    Visit Number  6    Number of Visits  9    Date for PT Re-Evaluation  06/14/19    Authorization Type  Medicare    Authorization Time Period  05-09-19 - 08-06-19    PT Start Time  1103    PT Stop Time  1150    PT Time Calculation (min)  47 min    Activity Tolerance  Patient tolerated treatment well    Behavior During Therapy  [facility] for tasks assessed/performed       Past Medical History:  Diagnosis Date  . Alcohol use   . Anemia   . Anxiety   . Asthma   . Basal cell carcinoma    forehead  . Breast cancer ([place]) 1998   left   . Colon polyps   . GERD (gastroesophageal reflux disease)   . Glaucoma   . Hyperlipidemia   . Hypertension   . Hyponatremia    remote, mild  . Mitral valve prolapse   . Osteoarthritis    sees rheumatologist  . Osteoporosis    sees rheumatologist  . Personal history of radiation therapy 1998  . PMR (polymyalgia rheumatica) (HCC)    sees rheumatologist  . Pneumonia   . Prinzmetal angina ([place])   . White matter changes    ? ischemic sm vs per pt    Past Surgical History:  Procedure Laterality Date  . ABDOMINAL HYSTERECTOMY    . BREAST LUMPECTOMY Left 1998   cancer  . CESAREAN SECTION     x 4  . COL[place]COPY  07/2012   in [place]  . HERNIA REPAIR  2005   abdominal hernia repair  . HYSTEROTOMY    . LYMPH NODE DISSECTION Left   . POLYPECTOMY      There were no vitals filed for this visit.  Subjective Assessment - 06/03/19 1107    Subjective  Pt states she is doing better - states it helps to just get moving again    Pertinent History  bilateral symmetric sensorineural hearing  loss:  bilateral occasional tinnitus; motion sickness; glaucoma; cataract lenses; HTN: osteoporosis; arthritis; trigeminal neuropathy:  polymyalgia rheumatica:  anxiety/depression    Patient Stated Goals  improve balance    Currently in Pain?  Yes    Pain Score  3     Pain Location  Hip   pt states she has Rt hip bursitis   Pain Orientation  Right    Pain Descriptors / Indicators  Aching;Discomfort    Pain Type  Chronic pain    Pain Onset  More than a month ago    Pain Frequency  Intermittent    Pain Relieving Factors  states MD gave her exercises for it which helps                       [facility] Adult PT Treatment/Exercise - 06/03/19 1104      Transfers   Transfers  Sit to Stand    Sit to Stand  5: Supervision    Number of Reps  Other reps (comment)    Comments  feet on blue Airex - no  UE support from mat       Ambulation/Gait   Ambulation/Gait  Yes    Ambulation/Gait Assistance  4: Min guard   min guard for LOB with turning 180 degrees    Ambulation Distance (Feet)  150 Feet    Assistive device  None    Gait Pattern  Within Functional Limits    Ambulation Surface  Level;Indoor    Stairs  Yes    Stairs Assistance  5: Supervision    Stair Management Technique  No rails;Alternating pattern;Forwards    Number of Stairs  4    Height of Stairs  6      High Level Balance   High Level Balance Activities  Backward walking;Turns;Sudden stops;Head turns          Balance Exercises - 06/03/19 2107      Balance Exercises: Standing   Standing Eyes Opened  Wide ([place]);Head turns;Foam/compliant surface;5 reps    Standing Eyes Closed  Wide (BOA);Head turns;Foam/compliant surface;5 reps    Rockerboard  Anterior/posterior;Head turns;EO;EC;10 reps;UE support    Gait with Head Turns  Forward;1 rep   25'   Turning  Right;Left;Other reps (comment)   4 reps to each side  - amb. 20' with abrupt stop & turn   Marching  Foam/compliant surface;Intermittent upper extremity  assist;Head turns;Static   on    Other Standing Exercises  pt stood on Bosu (compliant side) inside // bars - with UE support prn - weight shifts anterior/posterior and laterally - then performed horizontal and vertical head turns 5 reps each with EO and then with EC             PT Long Term Goals - 06/03/19 2127      PT LONG TERM GOAL #1   Title  Improve DHI score from 52% to </= 40% to indicate improvement in dizziness for improved quality of ife.    Baseline  52%- 05-09-19    Time  4    Period  Weeks    Status  New      PT LONG TERM GOAL #2   Title  Increase FGA score by at least 4 points to demo improved balance during gait for incr. safety.    Baseline  23/30 on 05/20/19    Time  4    Period  Weeks    Status  New      PT LONG TERM GOAL #3   Title  Improve DVA to </= 2 line difference for improved gaze stabilization.    Baseline  4 line difference on 05-09-19    Time  4    Period  Weeks    Status  New      PT LONG TERM GOAL #4   Title  Independent in HEP for balance and vestibular exercises.    Time  4    Period  Weeks    Status  New            Plan - 06/03/19 2109    Clinical Impression Statement  Pt c/o nausea at end of session after performing vestibular activities with EC to increase vestbular input in maintaining balance, indicative of vestibular hypofunction.  Pt compensates by performing body and head movement together, rather than dissociating head from trunk with isolated head rotation, as pt states the head turning increases dizziness/dysequilibrium.    Personal Factors and Comorbidities  Comorbidity 2;Past/Current Experience;Time since onset of injury/illness/exacerbation    Comorbidities  trigeminal neuropathy, polymyalgia rheumatica, HTN, depression,  arthritis, mitral valve prolapse; mild inferior abrasion in Rt external auditory canal per audiologist's report from 04-03-19 visit:  tinnitus, motion sickness, glaaucoma, sensorineural hearing loss, h/o  breast cancer    Examination-Activity Limitations  Locomotion Level;Transfers;Bend;Stairs;Squat;Stand    Examination-Participation Restrictions  Meal Prep;Cleaning;Community Activity;Driving;Shop    Stability/Clinical Decision Making  Evolving/Moderate complexity    Rehab Potential  Good    PT Frequency  2x / week    PT Duration  4 weeks    PT Treatment/Interventions  ADLs/Self Care Home Management;Vestibular;Gait training;Therapeutic activities;Therapeutic exercise;Balance training;Neuromuscular re-education;Patient/family education    PT Next Visit Plan  Please check LTG's next session as last session before she  leaves 06-13-19 for 3 weeks to visit daughter ( I will do D/C if she wants to be discharged, otherwise hold until she returns to see if she wishes to return to PT - pt has ortho eval scheduled at [facility] in June for neck tightness; review HEP as needed    PT Home Exercise Plan  x1 viewng and corner balance; progressed x1 viewing to standing position and added marching on pillows to HEP on 05-23-19    Consulted and Agree with Plan of Care  Patient       Patient will benefit from skilled therapeutic intervention in order to improve the following deficits and impairments:  Difficulty walking, Decreased balance, Impaired vision/preception  Visit Diagnosis: Unsteadiness on feet  Dizziness and giddiness  Other abnormalities of gait and mobility     Problem List Patient Active Problem List   Diagnosis Date Noted  . Trigeminal neuropathy 11/13/2017  . PMR (polymyalgia rheumatica) (HCC) 04/24/2017  . Hyponatremia 07/15/2015  . Hyperlipidemia 07/15/2015  . Esophageal spasm 07/15/2015  . Essential hypertension 07/15/2015  . Depression 07/15/2015  . Asthma 07/15/2015  . Tinnitus 07/15/2015  . Osteoporosis 07/15/2015  . Spasm   . Prinzmetal angina ([place])   . Mitral valve prolapse   . White matter changes     Houa Ackert, [place], PT 06/03/2019, 9:28 PM  [facility] [address] [place], [place], [address] Phone: [phone]   Fax:  [phone]  Name: Amanda Cannon MRN: [id number] Date of Birth: Sep 06, 1945

## 2019-06-06 ENCOUNTER — Other Ambulatory Visit: Payer: Self-pay

## 2019-06-06 ENCOUNTER — Ambulatory Visit: Payer: Medicare Other | Admitting: Physical Therapy

## 2019-06-06 DIAGNOSIS — R2689 Other abnormalities of gait and mobility: Secondary | ICD-10-CM

## 2019-06-06 DIAGNOSIS — R2681 Unsteadiness on feet: Secondary | ICD-10-CM

## 2019-06-06 DIAGNOSIS — R42 Dizziness and giddiness: Secondary | ICD-10-CM | POA: Diagnosis not present

## 2019-06-06 NOTE — Therapy (Signed)
[facility] [address] [place], [place], [address] Phone: [phone]   Fax:  [phone]  Physical Therapy Treatment  Patient Details  Name: Amanda Cannon MRN: [id number] Date of Birth: 10-19-1945 Referring Provider (PT): Leta Baptist, MD   Encounter Date: 06/06/2019  PT End of Session - 06/06/19 1102    Visit Number  7    Number of Visits  9    Date for PT Re-Evaluation  06/14/19    Authorization Type  Medicare    Authorization Time Period  05-09-19 - 08-06-19    PT Start Time  1015    PT Stop Time  1101    PT Time Calculation (min)  46 min    Activity Tolerance  Patient tolerated treatment well    Behavior During Therapy  [facility] for tasks assessed/performed       Past Medical History:  Diagnosis Date  . Alcohol use   . Anemia   . Anxiety   . Asthma   . Basal cell carcinoma    forehead  . Breast cancer ([place]) 1998   left   . Colon polyps   . GERD (gastroesophageal reflux disease)   . Glaucoma   . Hyperlipidemia   . Hypertension   . Hyponatremia    remote, mild  . Mitral valve prolapse   . Osteoarthritis    sees rheumatologist  . Osteoporosis    sees rheumatologist  . Personal history of radiation therapy 1998  . PMR (polymyalgia rheumatica) (HCC)    sees rheumatologist  . Pneumonia   . Prinzmetal angina ([place])   . White matter changes    ? ischemic sm vs per pt    Past Surgical History:  Procedure Laterality Date  . ABDOMINAL HYSTERECTOMY    . BREAST LUMPECTOMY Left 1998   cancer  . CESAREAN SECTION     x 4  . [place]  07/2012   in [place]  . HERNIA REPAIR  2005   abdominal hernia repair  . HYSTEROTOMY    . LYMPH NODE DISSECTION Left   . POLYPECTOMY      There were no vitals filed for this visit.  Subjective Assessment - 06/06/19 1029    Subjective  Pt is doing better but wonders why when she drinks water her L ear feels worse.  Feels a little dizzy after doing the gaze exercises.    Pertinent History  bilateral symmetric sensorineural hearing loss:  bilateral occasional tinnitus; motion sickness; glaucoma; cataract lenses; HTN: osteoporosis; arthritis; trigeminal neuropathy:  polymyalgia rheumatica:  anxiety/depression    Patient Stated Goals  improve balance    Currently in Pain?  Yes    Pain Onset  More than a month ago         [facility] PT Assessment - 06/06/19 1041      Assessment   Medical Diagnosis  Vertigo; Imbalance     Referring Provider (PT)  Leta Baptist, MD      Observation/Other Assessments   Focus on Therapeutic Outcomes (FOTO)   increased to 70%    Other Surveys   Dizziness Handicap Inventory (DHI)    Dizziness Handicap Inventory (DHI)   decreased to 36% (moderate)      Functional Gait  Assessment   Gait assessed   Yes    Gait Level Surface  Walks 20 ft in less than 7 sec but greater than 5.5 sec, uses assistive device, slower speed, mild gait deviations, or deviates 6-10 in outside of the  12 in walkway width.    Change in Gait Speed  Able to smoothly change walking speed without loss of balance or gait deviation. Deviate no more than 6 in outside of the 12 in walkway width.    Gait with Horizontal Head Turns  Performs head turns smoothly with slight change in gait velocity (eg, minor disruption to smooth gait path), deviates 6-10 in outside 12 in walkway width, or uses an assistive device.    Gait with Vertical Head Turns  Performs task with slight change in gait velocity (eg, minor disruption to smooth gait path), deviates 6 - 10 in outside 12 in walkway width or uses assistive device    Gait and Pivot Turn  Turns slowly, requires verbal cueing, or requires several small steps to catch balance following turn and stop    Step Over Obstacle  Is able to step over one shoe box (4.5 in total height) without changing gait speed. No evidence of imbalance.    Gait with Narrow Base of Support  Is able to ambulate for 10 steps heel to toe with no staggering.    Gait  with Eyes Closed  Walks 20 ft, slow speed, abnormal gait pattern, evidence for imbalance, deviates 10-15 in outside 12 in walkway width. Requires more than 9 sec to ambulate 20 ft.    Ambulating Backwards  Walks 20 ft, slow speed, abnormal gait pattern, evidence for imbalance, deviates 10-15 in outside 12 in walkway width.    Steps  Alternating feet, must use rail.   due to hip pain   Total Score  19    FGA comment:  19/30 - medium falls risk          Vestibular Assessment - 06/06/19 1044      Visual Acuity   Static  8    Dynamic  5   3 line difference, decreased dizziness                      PT Education - 06/06/19 1103    Education Details  progress towards goals, plan to put pt on hold during 3 week trip to daughter's in [place] and then will re-assess when she returns to see if further therapy is indicated    Person(s) Educated  Patient    Methods  Explanation    Comprehension  Verbalized understanding          PT Long Term Goals - 06/06/19 1031      PT LONG TERM GOAL #1   Title  Improve DHI score from 52% to </= 40% to indicate improvement in dizziness for improved quality of ife.    Baseline  FOTO: 70%, DHI: 36%    Time  4    Period  Weeks    Status  Achieved      PT LONG TERM GOAL #2   Title  Increase FGA score by at least 4 points to demo improved balance during gait for incr. safety.    Baseline  19/30    Time  4    Period  Weeks    Status  Achieved      PT LONG TERM GOAL #3   Title  Improve DVA to </= 2 line difference for improved gaze stabilization.    Baseline  4 line difference on 05-09-19 > 3 line difference    Time  4    Period  Weeks    Status  Partially Met  PT LONG TERM GOAL #4   Title  Independent in HEP for balance and vestibular exercises.    Time  4    Period  Weeks    Status  On-going            Plan - 06/06/19 1104    Clinical Impression Statement  Initiated assessment of patient's progress towards goals.  Pt  demonstrates improvement in VOR as indicated by decrease in DVA to 3 line difference, demonstrates improvement in balance during gait with reduced falls risk and overall improved function and decreased "handicap" based on FOTO and DHI scores.  Pt does continue to be at medium falls risk and demonstrates motion sensitivity, dizziness and balance impairments with ambulating with decreased visual input and when turning.  After next visit pt will travel to [place] to visit daughter for three weeks.  Will provide pt with complete HEP to perform when in [place] and will have pt return for re-assessment in the middle of June to determine if further therapy is indicated.  Pt agreeable to plan.    Personal Factors and Comorbidities  Comorbidity 2;Past/Current Experience;Time since onset of injury/illness/exacerbation    Comorbidities  trigeminal neuropathy, polymyalgia rheumatica, HTN, depression, arthritis, mitral valve prolapse; mild inferior abrasion in Rt external auditory canal per audiologist's report from 04-03-19 visit:  tinnitus, motion sickness, glaaucoma, sensorineural hearing loss, h/o breast cancer    Examination-Activity Limitations  Locomotion Level;Transfers;Bend;Stairs;Squat;Stand    Examination-Participation Restrictions  Meal Prep;Cleaning;Community Activity;Driving;Shop    Stability/Clinical Decision Making  Evolving/Moderate complexity    Rehab Potential  Good    PT Frequency  2x / week    PT Duration  4 weeks    PT Treatment/Interventions  ADLs/Self Care Home Management;Vestibular;Gait training;Therapeutic activities;Therapeutic exercise;Balance training;Neuromuscular re-education;Patient/family education    PT Next Visit Plan  Revise and give pt HEP to take to [place]; focus on x1 viewing, balance with eyes closed, turning, bending down, backwards walking    Consulted and Agree with Plan of Care  Patient       Patient will benefit from skilled therapeutic intervention in order to improve the following  deficits and impairments:  Difficulty walking, Decreased balance, Impaired vision/preception  Visit Diagnosis: Unsteadiness on feet  Dizziness and giddiness  Other abnormalities of gait and mobility     Problem List Patient Active Problem List   Diagnosis Date Noted  . Trigeminal neuropathy 11/13/2017  . PMR (polymyalgia rheumatica) (HCC) 04/24/2017  . Hyponatremia 07/15/2015  . Hyperlipidemia 07/15/2015  . Esophageal spasm 07/15/2015  . Essential hypertension 07/15/2015  . Depression 07/15/2015  . Asthma 07/15/2015  . Tinnitus 07/15/2015  . Osteoporosis 07/15/2015  . Spasm   . Prinzmetal angina ([place])   . Mitral valve prolapse   . White matter changes     Rico Junker, PT, DPT 06/06/19    11:12 AM    [facility] [address] [place], [place], [address] Phone: [phone]   Fax:  [phone]  Name: Vickee Mormino MRN: [id number] Date of Birth: [birth year]

## 2019-06-10 ENCOUNTER — Other Ambulatory Visit: Payer: Self-pay

## 2019-06-10 ENCOUNTER — Ambulatory Visit: Payer: Medicare Other | Admitting: Physical Therapy

## 2019-06-10 DIAGNOSIS — R2689 Other abnormalities of gait and mobility: Secondary | ICD-10-CM | POA: Diagnosis not present

## 2019-06-10 DIAGNOSIS — R42 Dizziness and giddiness: Secondary | ICD-10-CM | POA: Diagnosis not present

## 2019-06-10 DIAGNOSIS — R2681 Unsteadiness on feet: Secondary | ICD-10-CM

## 2019-06-10 NOTE — Patient Instructions (Addendum)
Gaze Stabilization - Tip Card  1.Target must remain in focus, not blurry, and appear stationary while head is in motion. 2.Perform exercises with small head movements (45 to either side of midline). 3.Increase speed of head motion so long as target is in focus. 4.If you wear eyeglasses, be sure you can see target through lens (therapist will give specific instructions for bifocal / progressive lenses). 5.These exercises may provoke dizziness or nausea. Work through these symptoms. If too dizzy, slow head movement slightly. Rest between each exercise. 6.Exercises demand concentration; avoid distractions. 7.For safety, perform standing exercises close to a counter, wall, corner, or next to someone.  Copyright  VHI. All rights reserved.   Gaze Stabilization - Standing Feet Apart   Week 1: Stand with a chair in front of you for support.  Feet shoulder width apart, keeping eyes on target on wall 3 feet away.  Ground down through hands and feet before starting the head movement.  Tilt head down slightly and move head side to side for 20 repetitions.  Ground down again at the end.   Repeat while moving head up and down for 20 repetitions slowly, ground down at the end.    Week 2: Perform the same exercise but without holding the chair.  Still go slow and ground down through your feet.  If you feel yourself swaying.  Stop, stabilize and start again.  Week 3: Same exercise but increase repetitions to 30.  Perform in the morning and in the evening.  _____________________________________________________________________________________________________ Week 1 - Perform this exercise with hands touching counter, chair behind you  Feet Apart, Head Motion - Eyes Closed    With eyes closed and feet shoulder width apart, move head slowly, up and down 10 times focusing on your balance.  Repeat moving head slowly, up and down 10 times.  Breathe and stay in touch with your body. Repeat __2__ times per  session. Do __1__ sessions per day MID DAY  WEEK 2 - PERFORM THE ABOVE EXERCISE WITH HANDS OFF THE COUNTER.  WEEK 3- PERFORM THE EXERCISE BELOW.  YOU MAY NEED TO HOLD ON AGAIN.  Feet Together, Head Motion - Eyes Closed    With eyes closed and feet together, move head slowly, up and down 10 TIMES; Ground down.  Repeat moving head slowly up and down, 10 times.  Repeat __2__ times per session. Do __1__ sessions per day.

## 2019-06-11 NOTE — Therapy (Signed)
[facility] [address] [place], [place], [address] Phone: [phone]   Fax:  [phone]  Physical Therapy Treatment  Patient Details  Name: Amanda Cannon MRN: [id number] Date of Birth: [birth year] Referring Provider (PT): Leta Baptist, MD   Encounter Date: 06/10/2019  PT End of Session - 06/11/19 1001    Visit Number  8    Number of Visits  9    Date for PT Re-Evaluation  06/14/19    Authorization Type  Medicare    Authorization Time Period  05-09-19 - 08-06-19    PT Start Time  1103    PT Stop Time  1148    PT Time Calculation (min)  45 min    Activity Tolerance  Patient tolerated treatment well    Behavior During Therapy  [facility] for tasks assessed/performed       Past Medical History:  Diagnosis Date  . Alcohol use   . Anemia   . Anxiety   . Asthma   . Basal cell carcinoma    forehead  . Breast cancer ([place]) 1998   left   . Colon polyps   . GERD (gastroesophageal reflux disease)   . Glaucoma   . Hyperlipidemia   . Hypertension   . Hyponatremia    remote, mild  . Mitral valve prolapse   . Osteoarthritis    sees rheumatologist  . Osteoporosis    sees rheumatologist  . Personal history of radiation therapy 1998  . PMR (polymyalgia rheumatica) (HCC)    sees rheumatologist  . Pneumonia   . Prinzmetal angina ([place])   . White matter changes    ? ischemic sm vs per pt    Past Surgical History:  Procedure Laterality Date  . ABDOMINAL HYSTERECTOMY    . BREAST LUMPECTOMY Left 1998   cancer  . CESAREAN SECTION     x 4  . COLONOSCOPY  07/2012   in [place]  . HERNIA REPAIR  2005   abdominal hernia repair  . HYSTEROTOMY    . LYMPH NODE DISSECTION Left   . POLYPECTOMY      There were no vitals filed for this visit.  Subjective Assessment - 06/10/19 1117    Subjective  Pt feeling a little more off balance today.  Is packing and getting ready to travel and found out brother is in the hospital for cancer.   Feels discouraged, "this is just how it's going to be."    Pertinent History  bilateral symmetric sensorineural hearing loss:  bilateral occasional tinnitus; motion sickness; glaucoma; cataract lenses; HTN: osteoporosis; arthritis; trigeminal neuropathy:  polymyalgia rheumatica:  anxiety/depression    Patient Stated Goals  improve balance    Pain Onset  More than a month ago         Gaze Stabilization - Tip Card  1.Target must remain in focus, not blurry, and appear stationary while head is in motion. 2.Perform exercises with small head movements (45 to either side of midline). 3.Increase speed of head motion so long as target is in focus. 4.If you wear eyeglasses, be sure you can see target through lens (therapist will give specific instructions for bifocal / progressive lenses). 5.These exercises may provoke dizziness or nausea. Work through these symptoms. If too dizzy, slow head movement slightly. Rest between each exercise. 6.Exercises demand concentration; avoid distractions. 7.For safety, perform standing exercises close to a counter, wall, corner, or next to someone.  Copyright  VHI. All rights reserved.   Gaze Stabilization -  Standing Feet Apart   Week 1: Stand with a chair in front of you for support.  Feet shoulder width apart, keeping eyes on target on wall 3 feet away.  Ground down through hands and feet before starting the head movement.  Tilt head down slightly and move head side to side for 20 repetitions.  Ground down again at the end.   Repeat while moving head up and down for 20 repetitions slowly, ground down at the end.    Week 2: Perform the same exercise but without holding the chair.  Still go slow and ground down through your feet.  If you feel yourself swaying.  Stop, stabilize and start again.  Week 3: Same exercise but increase repetitions to 30.  Perform in the morning and in the  evening.  _____________________________________________________________________________________________________ Week 1 - Perform this exercise with hands touching counter, chair behind you  Feet Apart, Head Motion - Eyes Closed    With eyes closed and feet shoulder width apart, move head slowly, up and down 10 times focusing on your balance.  Repeat moving head slowly, up and down 10 times.  Breathe and stay in touch with your body. Repeat __2__ times per session. Do __1__ sessions per day MID DAY  WEEK 2 - PERFORM THE ABOVE EXERCISE WITH HANDS OFF THE COUNTER.  WEEK 3- PERFORM THE EXERCISE BELOW.  YOU MAY NEED TO HOLD ON AGAIN.  Feet Together, Head Motion - Eyes Closed    With eyes closed and feet together, move head slowly, up and down 10 TIMES; Ground down.  Repeat moving head slowly up and down, 10 times.  Repeat __2__ times per session. Do __1__ sessions per day.     PT Education - 06/11/19 0956    Education Details  Pt feels resigned about symptoms and imbalance.  Provided pt with significant education regarding motion sensitivity and sensitivity of the nervous system to visual flow and movement as well as role of increased life stressors on symptoms.  When performing HEP educated pt on use of breathing and grounding to increase body awareness/proprioception during exercises with pt noting significant improvement in symptoms and decreased anxiety about LOB.  Pt given HEP with ways to gradually progress exercises each week.  Advised pt to schedule f/u appointment for when she returns for re-assessment and determine if she will require ongoing therapy.  Recommended pt continue to walk when in [place].    Person(s) Educated  Patient    Methods  Explanation;Demonstration;Handout    Comprehension  Verbalized understanding;Returned demonstration          PT Long Term Goals - 06/11/19 1001      PT LONG TERM GOAL #1   Title  Improve DHI score from 52% to </= 40% to indicate  improvement in dizziness for improved quality of life.    Baseline  FOTO: 70%, DHI: 36%    Time  4    Period  Weeks    Status  Achieved      PT LONG TERM GOAL #2   Title  Increase FGA score by at least 4 points to demo improved balance during gait for incr. safety.    Baseline  19/30    Time  4    Period  Weeks    Status  Achieved      PT LONG TERM GOAL #3   Title  Improve DVA to </= 2 line difference for improved gaze stabilization.    Baseline  4 line difference  on 05-09-19 > 3 line difference    Time  4    Period  Weeks    Status  Partially Met      PT LONG TERM GOAL #4   Title  Independent in HEP for balance and vestibular exercises.    Time  4    Period  Weeks    Status  Achieved            Plan - 06/11/19 1002    Clinical Impression Statement  Treatment session focused on final LTG -reviewing and revising HEP to minimize latency effect and to keep symptoms mild and lasting <30 minutes after completing exercises.  Recommended pt break up exercises throughout the day and provided pt with ways to progress exercises.  Pt able to return demonstrate exercises and use of breathing and grounding to decrease symptoms and anxiety.  Pt to return in June after returning from [facility] for re-assessment.  Pt to be on hold for 3 weeks.    Personal Factors and Comorbidities  Comorbidity 2;Past/Current Experience;Time since onset of injury/illness/exacerbation    Comorbidities  trigeminal neuropathy, polymyalgia rheumatica, HTN, depression, arthritis, mitral valve prolapse; mild inferior abrasion in Rt external auditory canal per audiologist's report from 04-03-19 visit:  tinnitus, motion sickness, glaaucoma, sensorineural hearing loss, h/o breast cancer    Examination-Activity Limitations  Locomotion Level;Transfers;Bend;Stairs;Squat;Stand    Examination-Participation Restrictions  Meal Prep;Cleaning;Community Activity;Driving;Shop    Stability/Clinical Decision Making  Evolving/Moderate  complexity    Rehab Potential  Good    PT Frequency  2x / week    PT Duration  4 weeks    PT Treatment/Interventions  ADLs/Self Care Home Management;Vestibular;Gait training;Therapeutic activities;Therapeutic exercise;Balance training;Neuromuscular re-education;Patient/family education    PT Next Visit Plan  Return from [facility] - perform re-assessment, need more therapy or D/C?  Update HEP    Consulted and Agree with Plan of Care  Patient       Patient will benefit from skilled therapeutic intervention in order to improve the following deficits and impairments:  Difficulty walking, Decreased balance, Impaired vision/preception  Visit Diagnosis: Unsteadiness on feet  Dizziness and giddiness  Other abnormalities of gait and mobility     Problem List Patient Active Problem List   Diagnosis Date Noted  . Trigeminal neuropathy 11/13/2017  . PMR (polymyalgia rheumatica) (HCC) 04/24/2017  . Hyponatremia 07/15/2015  . Hyperlipidemia 07/15/2015  . Esophageal spasm 07/15/2015  . Essential hypertension 07/15/2015  . Depression 07/15/2015  . Asthma 07/15/2015  . Tinnitus 07/15/2015  . Osteoporosis 07/15/2015  . Spasm   . Prinzmetal angina ([place])   . Mitral valve prolapse   . White matter changes     Rico Junker, PT, DPT 06/11/19    10:06 AM    [facility] [address] [place], [place], [address] Phone: [phone]   Fax:  [phone]  Name: Amanda Cannon MRN: [id number] Date of Birth: 1945-06-23

## 2019-06-17 ENCOUNTER — Other Ambulatory Visit: Payer: Self-pay | Admitting: Family Medicine

## 2019-06-17 ENCOUNTER — Encounter: Payer: Self-pay | Admitting: Family Medicine

## 2019-06-17 DIAGNOSIS — Z1231 Encounter for screening mammogram for malignant neoplasm of breast: Secondary | ICD-10-CM

## 2019-07-05 ENCOUNTER — Encounter: Payer: Self-pay | Admitting: Physical Therapy

## 2019-07-05 ENCOUNTER — Other Ambulatory Visit: Payer: Self-pay

## 2019-07-05 ENCOUNTER — Ambulatory Visit: Payer: Medicare Other | Attending: Otolaryngology | Admitting: Physical Therapy

## 2019-07-05 DIAGNOSIS — R2689 Other abnormalities of gait and mobility: Secondary | ICD-10-CM | POA: Diagnosis present

## 2019-07-05 DIAGNOSIS — R42 Dizziness and giddiness: Secondary | ICD-10-CM | POA: Diagnosis present

## 2019-07-05 DIAGNOSIS — R2681 Unsteadiness on feet: Secondary | ICD-10-CM | POA: Insufficient documentation

## 2019-07-05 NOTE — Therapy (Signed)
[facility] [address] [place], [place], [address] Phone: [phone]   Fax:  [phone]  Physical Therapy Treatment and 10th visit Progress Note  Patient Details  Name: Amanda Cannon MRN: [id number] Date of Birth: [birth year] Referring Provider (PT): Leta Baptist, MD   Encounter Date: 07/05/2019   Progress Note Reporting Period 05/10/19 to 07/05/19  See note below for Objective Data and Assessment of Progress/Goals.     PT End of Session - 07/05/19 1734    Visit Number 9   10th visit note performed on visit 9   Number of Visits 21    Date for PT Re-Evaluation 09/03/19    Authorization Type Medicare    Progress Note Due on Visit 19    PT Start Time 1317    PT Stop Time 1400    PT Time Calculation (min) 43 min    Activity Tolerance Patient tolerated treatment well    Behavior During Therapy WFL for tasks assessed/performed           Past Medical History:  Diagnosis Date  . Alcohol use   . Anemia   . Anxiety   . Asthma   . Basal cell carcinoma    forehead  . Breast cancer ([place]) 1998   left   . Colon polyps   . GERD (gastroesophageal reflux disease)   . Glaucoma   . Hyperlipidemia   . Hypertension   . Hyponatremia    remote, mild  . Mitral valve prolapse   . Osteoarthritis    sees rheumatologist  . Osteoporosis    sees rheumatologist  . Personal history of radiation therapy 1998  . PMR (polymyalgia rheumatica) (HCC)    sees rheumatologist  . Pneumonia   . Prinzmetal angina ([place])   . White matter changes    ? ischemic sm vs per pt    Past Surgical History:  Procedure Laterality Date  . ABDOMINAL HYSTERECTOMY    . BREAST LUMPECTOMY Left 1998   cancer  . CESAREAN SECTION     x 4  . COLONOSCOPY  07/2012   in [place]  . HERNIA REPAIR  2005   abdominal hernia repair  . HYSTEROTOMY    . LYMPH NODE DISSECTION Left   . POLYPECTOMY      There were no vitals filed for this visit.   Subjective  Assessment - 07/05/19 1322    Subjective Trip to [place] was not great - went for walks every day but then sat most of the day; came back with worse R hip bursitis or IT band syndrome.  Still feeling dizziness in more enclosed spaces.    Pertinent History bilateral symmetric sensorineural hearing loss:  bilateral occasional tinnitus; motion sickness; glaucoma; cataract lenses; HTN: osteoporosis; arthritis; trigeminal neuropathy:  polymyalgia rheumatica:  anxiety/depression    Patient Stated Goals improve balance    Currently in Pain? Yes    Pain Score 5     Pain Location Hip    Pain Orientation Right    Pain Descriptors / Indicators Aching;Radiating    Pain Onset More than a month ago              [facility] PT Assessment - 07/05/19 1328      Assessment   Medical Diagnosis Vertigo; Imbalance     Referring Provider (PT) Leta Baptist, MD    Onset Date/Surgical Date --   approx. 4 years ago     Precautions   Precautions None  Prior Function   Level of Independence Independent      Observation/Other Assessments   Focus on Therapeutic Outcomes (FOTO)  increased to 70%    Other Surveys  Dizziness Handicap Inventory (DHI)    Dizziness Handicap Inventory (DHI)  decreased to 36% (moderate)      Functional Gait  Assessment   Gait assessed  Yes    Gait Level Surface Walks 20 ft in less than 7 sec but greater than 5.5 sec, uses assistive device, slower speed, mild gait deviations, or deviates 6-10 in outside of the 12 in walkway width.    Change in Gait Speed Able to smoothly change walking speed without loss of balance or gait deviation. Deviate no more than 6 in outside of the 12 in walkway width.    Gait with Horizontal Head Turns Performs head turns smoothly with slight change in gait velocity (eg, minor disruption to smooth gait path), deviates 6-10 in outside 12 in walkway width, or uses an assistive device.    Gait with Vertical Head Turns Performs task with slight change in gait velocity (eg,  minor disruption to smooth gait path), deviates 6 - 10 in outside 12 in walkway width or uses assistive device    Gait and Pivot Turn Pivot turns safely in greater than 3 sec and stops with no loss of balance, or pivot turns safely within 3 sec and stops with mild imbalance, requires small steps to catch balance.    Step Over Obstacle Is able to step over 2 stacked shoe boxes taped together (9 in total height) without changing gait speed. No evidence of imbalance.    Gait with Narrow Base of Support Is able to ambulate for 10 steps heel to toe with no staggering.    Gait with Eyes Closed Walks 20 ft, slow speed, abnormal gait pattern, evidence for imbalance, deviates 10-15 in outside 12 in walkway width. Requires more than 9 sec to ambulate 20 ft.    Ambulating Backwards Walks 20 ft, slow speed, abnormal gait pattern, evidence for imbalance, deviates 10-15 in outside 12 in walkway width.    Steps Alternating feet, must use rail.    Total Score 21    FGA comment: 21/30; reporting increase in R lateral hip pain today after traveling to/from [place]               Vestibular Assessment - 07/05/19 1340      Visual Acuity   Static 8    Dynamic 6   2 line difference     Positional Sensitivities   Nose to Right Knee No dizziness    Right Knee to Sitting Moderate dizziness    Nose to Left Knee No dizziness    Left Knee to Sitting Moderate dizziness    Head Turning x 5 Mild dizziness    Head Nodding x 5 Moderate dizziness    Pivot Right in Standing No dizziness    Pivot Left in Standing No dizziness    Positional Sensitivities Comments bending down at home causes symptoms (bending down to feed cat);                     [facility] PT Treatment/Exercise - 07/05/19 1356      Therapeutic Activites    Therapeutic Activities Other Therapeutic Activities    Other Therapeutic Activities Pt is currently using slow movements to compensate for dizziness to move safer and to decrease intensity  of symptoms with daily movements.  Pt's  goal is to not have to use compensatory techniques when performing cooking, feeding cat and standing in the shower with eyes closed.                  PT Education - 07/05/19 1724    Education Details Progress made; pt continues to be resigned about her dizziness and imbalance and reports she is using multiple compensatory measures at home.  Discussed patient's goals and if she wanted to continue to work towards compensations or if she wanted to work towards adaptation and being able to perform activities without compensation.  Pt reports she would like to perform activities without compensation.    Person(s) Educated Patient    Methods Explanation    Comprehension Verbalized understanding               PT Long Term Goals - 07/05/19 1353      PT LONG TERM GOAL #1   Title Improve DHI score from 52% to </= 40% to indicate improvement in dizziness for improved quality of life.    Baseline FOTO: 70%, DHI: 36%    Time 4    Period Weeks    Status Achieved      PT LONG TERM GOAL #2   Title Increase FGA score by at least 4 points to demo improved balance during gait for incr. safety.    Baseline 21/30    Time 4    Period Weeks    Status Achieved      PT LONG TERM GOAL #3   Title Improve DVA to </= 2 line difference for improved gaze stabilization.    Baseline 4 line difference on 05-09-19 > 3 line difference > 2 line difference    Time 4    Period Weeks    Status Achieved      PT LONG TERM GOAL #4   Title Independent in HEP for balance and vestibular exercises.    Time 4    Period Weeks    Status Achieved          New goals for recertification:   PT Short Term Goals - 07/05/19 1745      PT SHORT TERM GOAL #1   Title Pt will be independent with ongoing vestibular and balance HEP    Time 4    Period Weeks    Status New    Target Date 08/04/19      PT SHORT TERM GOAL #2   Title Pt will improve FGA by 2 points with  improved balance while performing gait with head turns and nods    Baseline 21/30    Time 4    Period Weeks    Status New    Target Date 08/04/19      PT SHORT TERM GOAL #3   Title Pt will report mild dizziness with head turns, nods, body turns and bending down to ground    Baseline moderate dizziness    Time 4    Period Weeks    Status New    Target Date 08/04/19           PT Long Term Goals - 07/05/19 1747      PT LONG TERM GOAL #1   Title Decrease DHI by 10 points to indicate improvement in dizziness for improved quality of life.    Baseline DHI: 36%    Time 4    Period Weeks    Status Revised    Target Date 09/03/19  PT LONG TERM GOAL #2   Title Increase FGA score to >/= 24/30 points to demo improved balance during gait for incr. safety.    Baseline 21/30    Time 4    Period Weeks    Status Revised    Target Date 09/03/19      PT LONG TERM GOAL #3   Title Independent in HEP for balance and vestibular exercises.    Time 8    Period Weeks    Status New    Target Date 09/03/19      PT LONG TERM GOAL #4   Title Report symptoms no greater than 1/5 on MSQ for head turns/nods, body turns, and bending down to the ground to allow pt to be more independent with household activities    Time 8    Period Weeks    Status New    Target Date 09/03/19                Plan - 07/05/19 1732    Clinical Impression Statement Pt returns for re-assessment to see if she would benefit from further vestibular rehab.  Pt has progressed and met all outcome measure goals but this has not translated over into greater balance confidence or function at home.  Pt instead is using multiple compensatory strategies to reduce symptoms.  Pt continues to demonstrate impaired dynamic standing balance and gait, visual motion sensitivity and impaired postural control.  Pt most symptomatic with head nods, head turns (sitting or standing), body turns to L and R and bending down to floor.  Pt is  also symptomatic in more enclosed spaces.  Pt will benefit from continued skilled PT services to address these impairments to reduce need for compensatory strategies at home, to maximize independence and decrease falls risk.    Personal Factors and Comorbidities Comorbidity 2;Past/Current Experience;Time since onset of injury/illness/exacerbation    Comorbidities trigeminal neuropathy, polymyalgia rheumatica, HTN, depression, arthritis, mitral valve prolapse; mild inferior abrasion in Rt external auditory canal per audiologist's report from 04-03-19 visit:  tinnitus, motion sickness, glaaucoma, sensorineural hearing loss, h/o breast cancer    Examination-Activity Limitations Locomotion Level;Transfers;Bend;Stairs;Squat;Stand    Examination-Participation Restrictions Meal Prep;Cleaning;Community Activity;Driving;Shop    Stability/Clinical Decision Making --    Rehab Potential Good    PT Frequency 2x / week    PT Duration 8 weeks    PT Treatment/Interventions ADLs/Self Care Home Management;Vestibular;Gait training;Therapeutic activities;Therapeutic exercise;Balance training;Neuromuscular re-education;Patient/family education;Aquatic Therapy;Functional mobility training    PT Next Visit Plan Update HEP.  Continue to work on postural control/grounding, stability with turning/pivoting, gait with head turns, backwards walking, balance with eyes closed, bending down to ground    Consulted and Agree with Plan of Care Patient           Patient will benefit from skilled therapeutic intervention in order to improve the following deficits and impairments:  Difficulty walking, Decreased balance, Impaired vision/preception, Dizziness  Visit Diagnosis: Unsteadiness on feet  Dizziness and giddiness  Other abnormalities of gait and mobility     Problem List Patient Active Problem List   Diagnosis Date Noted  . Trigeminal neuropathy 11/13/2017  . PMR (polymyalgia rheumatica) (HCC) 04/24/2017  .  Hyponatremia 07/15/2015  . Hyperlipidemia 07/15/2015  . Esophageal spasm 07/15/2015  . Essential hypertension 07/15/2015  . Depression 07/15/2015  . Asthma 07/15/2015  . Tinnitus 07/15/2015  . Osteoporosis 07/15/2015  . Spasm   . Prinzmetal angina ([place])   . Mitral valve prolapse   . White matter changes  Rico Junker, PT, DPT 07/05/19    5:45 PM    [facility] [address] [place], [place], [address] Phone: [phone]   Fax:  [phone]  Name: Amanda Cannon MRN: [id number] Date of Birth: 04/04/1945

## 2019-07-10 DIAGNOSIS — N182 Chronic kidney disease, stage 2 (mild): Secondary | ICD-10-CM | POA: Diagnosis not present

## 2019-07-15 ENCOUNTER — Other Ambulatory Visit: Payer: Self-pay

## 2019-07-15 ENCOUNTER — Ambulatory Visit: Payer: Medicare Other | Attending: Family Medicine

## 2019-07-15 DIAGNOSIS — M6281 Muscle weakness (generalized): Secondary | ICD-10-CM | POA: Insufficient documentation

## 2019-07-15 DIAGNOSIS — R252 Cramp and spasm: Secondary | ICD-10-CM | POA: Insufficient documentation

## 2019-07-15 DIAGNOSIS — R293 Abnormal posture: Secondary | ICD-10-CM | POA: Insufficient documentation

## 2019-07-15 DIAGNOSIS — M542 Cervicalgia: Secondary | ICD-10-CM | POA: Diagnosis present

## 2019-07-15 NOTE — Therapy (Signed)
[facility] [address], [place], [place], [address] Phone: [phone]   Fax:  [phone]  Physical Therapy Evaluation  Patient Details  Name: Amanda Cannon MRN: [id number] Date of Birth: 09-29-1945 Referring Provider (PT): Presley Raddle, MD   Encounter Date: 07/15/2019   PT End of Session - 07/15/19 1012    Visit Number 10   1- neck, 9- vestibular   Date for PT Re-Evaluation 09/03/19   09/09/19- neck   Authorization Type Medicare.  POC for neck 09/09/19    Progress Note Due on Visit 45    PT Start Time 0932    PT Stop Time 1015    PT Time Calculation (min) 43 min    Activity Tolerance Patient tolerated treatment well    Behavior During Therapy WFL for tasks assessed/performed           Past Medical History:  Diagnosis Date   Alcohol use    Anemia    Anxiety    Asthma    Basal cell carcinoma    forehead   Breast cancer ([place]) 1998   left    Colon polyps    GERD (gastroesophageal reflux disease)    Glaucoma    Hyperlipidemia    Hypertension    Hyponatremia    remote, mild   Mitral valve prolapse    Osteoarthritis    sees rheumatologist   Osteoporosis    sees rheumatologist   Personal history of radiation therapy 1998   PMR (polymyalgia rheumatica) ([place])    sees rheumatologist   Pneumonia    Prinzmetal angina ([place])    White matter changes    ? ischemic sm vs per pt    Past Surgical History:  Procedure Laterality Date   ABDOMINAL HYSTERECTOMY     BREAST LUMPECTOMY Left 1998   cancer   CESAREAN SECTION     x 4   COLONOSCOPY  07/2012   in [place]  2005   abdominal hernia repair   HYSTEROTOMY     LYMPH NODE DISSECTION Left    POLYPECTOMY      There were no vitals filed for this visit.    Subjective Assessment - 07/15/19 0935    Subjective Pt presents to PT with complaints of neck pain of a chronic nature.  Pt is being treated at our  neuro clinic for vestibular disorder.  Pt reports that her cervical mobility is limited and this is impacting her ability to perform exercises for this.    Pertinent History bilateral symmetric sensorineural hearing loss:  bilateral occasional tinnitus; motion sickness; glaucoma; cataract lenses; HTN: osteoporosis; arthritis; trigeminal neuropathy:  polymyalgia rheumatica:  anxiety/depression    Patient Stated Goals improve cervical mobility    Currently in Pain? Yes    Pain Score 1     Pain Location Neck    Pain Orientation Right;Left    Pain Descriptors / Indicators Tightness    Pain Type Chronic pain    Pain Radiating Towards no real pain-just stiffness    Pain Onset More than a month ago    Pain Frequency Intermittent    Aggravating Factors  turning head    Pain Relieving Factors not moving head              [facility] PT Assessment - 07/15/19 0001      Assessment   Medical Diagnosis neck pain    Referring Provider (PT) Presley Raddle, MD    Hand Dominance  Right    Next MD Visit none    Prior Therapy for vertigo-at neurorehab      Precautions   Precautions None      Balance Screen   Has the patient fallen in the past 6 months No    Has the patient had a decrease in activity level because of a fear of falling?  No    Is the patient reluctant to leave their home because of a fear of falling?  No      Home Ecologist residence      Prior Function   Level of Independence Independent    Vocation Retired    Microbiologist at [employer]   Overall Cognitive Status Within Functional Limits for tasks assessed      Observation/Other Assessments   Neck Disability Index  26%   FOTO not available     Posture/Postural Control   Posture/Postural Control Postural limitations    Postural Limitations Forward head;Flexed trunk;Rounded Shoulders      ROM / Strength   AROM / PROM / Strength AROM;PROM;Strength       AROM   Overall AROM  Deficits    Overall AROM Comments UE A/ROM limited by 25-50% in all directions bilaterally     AROM Assessment Site Cervical    Cervical Flexion 60    Cervical Extension 15    Cervical - Right Side Bend 20    Cervical - Left Side Bend 22    Cervical - Right Rotation 40    Cervical - Left Rotation 40      PROM   Overall PROM  Deficits    Overall PROM Comments limited passively in all directions in the cervical spine      Strength   Overall Strength Deficits    Strength Assessment Site Shoulder    Right/Left Shoulder Right;Left    Right Shoulder Flexion 3/5    Right Shoulder ABduction 3-/5    Right Shoulder Internal Rotation 3-/5    Left Shoulder Flexion 3/5    Left Shoulder ABduction 3-/5    Left Shoulder External Rotation 3-/5      Palpation   Spinal mobility significant reduction in cervical PA mobility and sideglide by 50% at all levels    Palpation comment tension/trigger points in bil upper traps, suboccipitals and cervical paraspinals.  Pain with PA mobs C3-7 and T2-4      Transfers   Transfers Sit to Stand    Sit to Stand 5: Supervision      Ambulation/Gait   Ambulation/Gait Yes    Gait Pattern Within Functional Limits                      Objective measurements completed on examination: See above findings.               PT Education - 07/15/19 1024    Education Details Access Code: [email or web address]    Person(s) Educated Patient    Methods Explanation;Handout;Demonstration    Comprehension Verbalized understanding;Returned demonstration            PT Short Term Goals - 07/15/19 0940      PT SHORT TERM GOAL #1   Title Pt will be independent with ongoing vestibular and balance HEP    Time 4    Period Weeks    Status On-going    Target Date 08/04/19      PT  SHORT TERM GOAL #2   Title Pt will improve FGA by 2 points with improved balance while performing gait with head turns and nods    Baseline 21/30    Time 4     Period Weeks    Status New    Target Date 08/04/19      PT SHORT TERM GOAL #3   Title Pt will report mild dizziness with head turns, nods, body turns and bending down to ground    Baseline moderate dizziness    Time 4    Period Weeks    Status New    Target Date 08/04/19      PT SHORT TERM GOAL #4   Title be independent in initial HEP for neck    Time 4    Period Weeks    Status New    Target Date 08/12/19      PT SHORT TERM GOAL #5   Title demonstrate > or = to 40 degrees of cervical A/ROM rotation bilaterally to improve safety with driving    Time 4    Period Weeks    Status New    Target Date 08/12/19             PT Long Term Goals - 07/15/19 0943      PT LONG TERM GOAL #1   Title Decrease DHI by 10 points to indicate improvement in dizziness for improved quality of life.    Baseline DHI: 36%    Time 4    Period Weeks    Status Revised      PT LONG TERM GOAL #2   Title Increase FGA score to >/= 24/30 points to demo improved balance during gait for incr. safety.    Baseline 21/30    Time 4    Period Weeks    Status Revised      PT LONG TERM GOAL #3   Title Independent in HEP for balance and vestibular exercises.    Baseline 4 line difference on 05-09-19 > 3 line difference > 2 line difference    Time 8    Period Weeks    Status New      PT LONG TERM GOAL #4   Title Report symptoms no greater than 1/5 on MSQ for head turns/nods, body turns, and bending down to the ground to allow pt to be more independent with household activities    Period Weeks    Status New      PT LONG TERM GOAL #5   Title be independent in advanced HEP for neck pain    Time 8    Period Weeks    Status New    Target Date 09/09/19      PT LONG TERM GOAL #6   Title reduce NDI to < or = to 10% to improve function with daily tasks    Time 8    Period Weeks    Status New    Target Date 09/09/19      PT LONG TERM GOAL #7   Title improve postural strength to sit with  neutral/upright posture > or = to 75% of the time    Time 8    Period Weeks    Status New    Target Date 09/09/19      PT LONG TERM GOAL #8   Title demonstrate cervical A/ROM rotation to > or = to 50 degrees to improve safety with driving    Time 8  Period Weeks    Status New    Target Date 09/09/19      PT LONG TERM GOAL  #9   TITLE imrpove UE strength to > or = to 4-/5 to improve endurance for functional use    Time 8    Period Weeks    Status New    Target Date 09/09/19                  Plan - 07/15/19 1116    Clinical Impression Statement Pt presents to PT with complaints of neck stiffness and limited cervical A/ROM of a chronic nature.  Pt also reports a slow decline in postural alignment and endurance for sitting with neutral posture.  Pt is currently being treated at our neuro clinic for vestibular symptoms and it was noted that her limited cervical A/ROM might be a limitation to her current treatment.  Pt has a complex medical history including trigeminal neuralgia on the Lt, polymyalgia rheumatica, and osteoporosis.  Pt demonstrates significant limitation in cervical and UE A/ROM and P/ROM bilaterally.  Pt with reduced segmental mobility in the cervical and thoracic spine with pain.  Pt with tension in bil upper traps, cervical paraspinals and suboccipitals and forward head and rounded shoulder posture.  Pt will benefit from skilled PT to improve functional cervical and UE A/ROM to improve safety with driving and improve cervical movement with vestibular exercises.    Personal Factors and Comorbidities Comorbidity 2;Past/Current Experience;Time since onset of injury/illness/exacerbation    Comorbidities trigeminal neuropathy, polymyalgia rheumatica, HTN, depression, arthritis, mitral valve prolapse; mild inferior abrasion in Rt external auditory canal per audiologist's report from 04-03-19 visit:  tinnitus, motion sickness, glaaucoma, sensorineural hearing loss, h/o breast  cancer    Examination-Activity Limitations Caring for Others    Examination-Participation Restrictions Community Activity;Driving    Stability/Clinical Decision Making Evolving/Moderate complexity    Clinical Decision Making Moderate    Rehab Potential Good    PT Frequency 2x / week    PT Duration 8 weeks    PT Treatment/Interventions ADLs/Self Care Home Management;Gait training;Therapeutic activities;Therapeutic exercise;Balance training;Neuromuscular re-education;Patient/family education;Functional mobility training;Cryotherapy;Electrical Stimulation;Moist Heat;Dry needling;Manual techniques;Passive range of motion;Taping    PT Next Visit Plan dry needling to cervical multifidi, suboccipitals and upper traps.  Add postural strength to HEP    PT Home Exercise Plan x1 viewng and corner balance; progressed x1 viewing to standing position and added marching on pillows to HEP on 05-23-19, Access Code: [email or web address] for neck    Consulted and Agree with Plan of Care Patient           Patient will benefit from skilled therapeutic intervention in order to improve the following deficits and impairments:  Decreased strength, Impaired flexibility, Postural dysfunction, Pain, Improper body mechanics, Hypomobility, Increased muscle spasms  Visit Diagnosis: Cervicalgia - Plan: PT plan of care cert/re-cert  Cramp and spasm - Plan: PT plan of care cert/re-cert  Abnormal posture - Plan: PT plan of care cert/re-cert  Muscle weakness (generalized) - Plan: PT plan of care cert/re-cert     Problem List Patient Active Problem List   Diagnosis Date Noted   Trigeminal neuropathy 11/13/2017   PMR (polymyalgia rheumatica) ([place]) 04/24/2017   Hyponatremia 07/15/2015   Hyperlipidemia 07/15/2015   Esophageal spasm 07/15/2015   Essential hypertension 07/15/2015   Depression 07/15/2015   Asthma 07/15/2015   Tinnitus 07/15/2015   Osteoporosis 07/15/2015   Spasm    Prinzmetal angina ([facility])     Mitral valve prolapse  White matter changes      Sigurd Sos, PT 07/15/19 11:22 AM  [facility] [address], [place], [place], [address] Phone: [phone]   Fax:  [phone]  Name: Timaya Bojarski MRN: [id number] Date of Birth: October 10, 1945

## 2019-07-15 NOTE — Patient Instructions (Signed)
Access Code: [email or web address] URL: [email or web address] Date: 07/15/2019 Prepared by: Claiborne Billings  Exercises Seated Cervical Flexion AROM - 3 x daily - 7 x weekly - 3 reps - 1 sets - 20 hold Seated Cervical Sidebending AROM - 3 x daily - 7 x weekly - 3 reps - 1 sets - 20 hold Seated Cervical Rotation AROM - 3 x daily - 7 x weekly - 3 reps - 1 sets - 20 hold Seated Correct Posture - 1 x daily - 7 x weekly - 10 reps - 3 sets Supine Shoulder Flexion AAROM with Hands Clasped - 3 x daily - 7 x weekly - 1 sets - 10 reps - 10 hold

## 2019-07-17 ENCOUNTER — Other Ambulatory Visit: Payer: Self-pay

## 2019-07-17 ENCOUNTER — Ambulatory Visit: Payer: Medicare Other

## 2019-07-17 DIAGNOSIS — R293 Abnormal posture: Secondary | ICD-10-CM

## 2019-07-17 DIAGNOSIS — M542 Cervicalgia: Secondary | ICD-10-CM | POA: Diagnosis not present

## 2019-07-17 DIAGNOSIS — R252 Cramp and spasm: Secondary | ICD-10-CM

## 2019-07-17 DIAGNOSIS — M6281 Muscle weakness (generalized): Secondary | ICD-10-CM

## 2019-07-17 NOTE — Therapy (Signed)
[facility] [address] [place], [place], [address] Phone: [phone]   Fax:  [phone]  Physical Therapy Treatment  Patient Details  Name: Amanda Cannon MRN: [id number] Date of Birth: 06-16-1945 Referring Provider (PT): Presley Raddle, MD   Encounter Date: 07/17/2019   PT End of Session - 07/17/19 0842    Visit Number 11   2 neck, 9 vestibular   Date for PT Re-Evaluation 09/03/19   09/09/19 neck   Authorization Type Medicare.  POC for neck 09/09/19    Progress Note Due on Visit 19    PT Start Time 0800   dry needling and heat   PT Stop Time 0849    PT Time Calculation (min) 49 min    Activity Tolerance Patient tolerated treatment well    Behavior During Therapy [facility] for tasks assessed/performed           Past Medical History:  Diagnosis Date  . Alcohol use   . Anemia   . Anxiety   . Asthma   . Basal cell carcinoma    forehead  . Breast cancer ([place]) 1998   left   . Colon polyps   . GERD (gastroesophageal reflux disease)   . Glaucoma   . Hyperlipidemia   . Hypertension   . Hyponatremia    remote, mild  . Mitral valve prolapse   . Osteoarthritis    sees rheumatologist  . Osteoporosis    sees rheumatologist  . Personal history of radiation therapy 1998  . PMR (polymyalgia rheumatica) ([facility])    sees rheumatologist  . Pneumonia   . Prinzmetal angina ([place])   . White matter changes    ? ischemic sm vs per pt    Past Surgical History:  Procedure Laterality Date  . ABDOMINAL HYSTERECTOMY    . BREAST LUMPECTOMY Left 1998   cancer  . CESAREAN SECTION     x 4  . COLONOSCOPY  07/2012   in [place]  . HERNIA REPAIR  2005   abdominal hernia repair  . HYSTEROTOMY    . LYMPH NODE DISSECTION Left   . POLYPECTOMY      There were no vitals filed for this visit.   Subjective Assessment - 07/17/19 0758    Subjective Things are feeling fine.    Currently in Pain? No/denies                              [facility] Adult PT Treatment/Exercise - 07/17/19 0001      Modalities   Modalities Moist Heat      Moist Heat Therapy   Number Minutes Moist Heat 10 Minutes    Moist Heat Location Cervical      Manual Therapy   Manual Therapy Soft tissue mobilization;Myofascial release    Manual therapy comments elongation and trigger point release to bil neck and upper traps            Trigger Point Dry Needling - 07/17/19 0001    Consent Given? Yes    Education Handout Provided Yes    Muscles Treated Head and Neck Upper trapezius;Cervical multifidi;Suboccipitals    Upper Trapezius Response Twitch reponse elicited;Palpable increased muscle length    Suboccipitals Response Twitch response elicited;Palpable increased muscle length    Cervical multifidi Response Twitch reponse elicited;Palpable increased muscle length                PT Education - 07/17/19  0802    Education Details DN info    Person(s) Educated Patient    Methods Explanation;Handout    Comprehension Verbalized understanding;Returned demonstration            PT Short Term Goals - 07/15/19 0940      PT SHORT TERM GOAL #1   Title Pt will be independent with ongoing vestibular and balance HEP    Time 4    Period Weeks    Status On-going    Target Date 08/04/19      PT SHORT TERM GOAL #2   Title Pt will improve FGA by 2 points with improved balance while performing gait with head turns and nods    Baseline 21/30    Time 4    Period Weeks    Status New    Target Date 08/04/19      PT SHORT TERM GOAL #3   Title Pt will report mild dizziness with head turns, nods, body turns and bending down to ground    Baseline moderate dizziness    Time 4    Period Weeks    Status New    Target Date 08/04/19      PT SHORT TERM GOAL #4   Title be independent in initial HEP for neck    Time 4    Period Weeks    Status New    Target Date 08/12/19      PT SHORT TERM GOAL #5   Title  demonstrate > or = to 40 degrees of cervical A/ROM rotation bilaterally to improve safety with driving    Time 4    Period Weeks    Status New    Target Date 08/12/19             PT Long Term Goals - 07/15/19 0943      PT LONG TERM GOAL #1   Title Decrease DHI by 10 points to indicate improvement in dizziness for improved quality of life.    Baseline DHI: 36%    Time 4    Period Weeks    Status Revised      PT LONG TERM GOAL #2   Title Increase FGA score to >/= 24/30 points to demo improved balance during gait for incr. safety.    Baseline 21/30    Time 4    Period Weeks    Status Revised      PT LONG TERM GOAL #3   Title Independent in HEP for balance and vestibular exercises.    Baseline 4 line difference on 05-09-19 > 3 line difference > 2 line difference    Time 8    Period Weeks    Status New      PT LONG TERM GOAL #4   Title Report symptoms no greater than 1/5 on MSQ for head turns/nods, body turns, and bending down to the ground to allow pt to be more independent with household activities    Period Weeks    Status New      PT LONG TERM GOAL #5   Title be independent in advanced HEP for neck pain    Time 8    Period Weeks    Status New    Target Date 09/09/19      PT LONG TERM GOAL #6   Title reduce NDI to < or = to 10% to improve function with daily tasks    Time 8    Period Weeks    Status New  Target Date 09/09/19      PT LONG TERM GOAL #7   Title improve postural strength to sit with neutral/upright posture > or = to 75% of the time    Time 8    Period Weeks    Status New    Target Date 09/09/19      PT LONG TERM GOAL #8   Title demonstrate cervical A/ROM rotation to > or = to 50 degrees to improve safety with driving    Time 8    Period Weeks    Status New    Target Date 09/09/19      PT LONG TERM GOAL  #9   TITLE imrpove UE strength to > or = to 4-/5 to improve endurance for functional use    Time 8    Period Weeks    Status New     Target Date 09/09/19                 Plan - 07/17/19 0841    Clinical Impression Statement Pt with first time follow-up after evaluation.  Session focused on dry needling and manual therapy to neck and upper traps.  Pt with significant tension and trigger points in bil cervical spine and upper traps and demonstrated improved tissue mobility and reported reduced stiffness after manual therapy. Pt will continue to stretch and work on postural alignment.  Pt will continue to benefit from skilled PT to address cervical mobility, flexibility and postural strength.    PT Frequency 2x / week    PT Duration 8 weeks    PT Treatment/Interventions ADLs/Self Care Home Management;Gait training;Therapeutic activities;Therapeutic exercise;Balance training;Neuromuscular re-education;Patient/family education;Functional mobility training;Cryotherapy;Electrical Stimulation;Moist Heat;Dry needling;Manual techniques;Passive range of motion;Taping    PT Next Visit Plan assess response dry needling to cervical multifidi, suboccipitals and upper traps.  Add postural strength to HEP    PT Home Exercise Plan x1 viewng and corner balance; progressed x1 viewing to standing position and added marching on pillows to HEP on 05-23-19, Access Code: [email or web address] for neck           Patient will benefit from skilled therapeutic intervention in order to improve the following deficits and impairments:  Decreased strength, Impaired flexibility, Postural dysfunction, Pain, Improper body mechanics, Hypomobility, Increased muscle spasms  Visit Diagnosis: Cervicalgia  Cramp and spasm  Abnormal posture  Muscle weakness (generalized)     Problem List Patient Active Problem List   Diagnosis Date Noted  . Trigeminal neuropathy 11/13/2017  . PMR (polymyalgia rheumatica) ([facility]) 04/24/2017  . Hyponatremia 07/15/2015  . Hyperlipidemia 07/15/2015  . Esophageal spasm 07/15/2015  . Essential hypertension 07/15/2015  . Depression  07/15/2015  . Asthma 07/15/2015  . Tinnitus 07/15/2015  . Osteoporosis 07/15/2015  . Spasm   . Prinzmetal angina ([place])   . Mitral valve prolapse   . White matter changes     Sigurd Sos, PT 07/17/19 8:44 AM  [facility] [address] [place], [place], [address] Phone: [phone]   Fax:  [phone]  Name: Amanda Cannon MRN: [id number] Date of Birth: [birth year]

## 2019-07-17 NOTE — Patient Instructions (Signed)

## 2019-07-22 ENCOUNTER — Ambulatory Visit: Payer: Medicare Other | Admitting: Physical Therapy

## 2019-07-22 ENCOUNTER — Other Ambulatory Visit: Payer: Self-pay

## 2019-07-22 ENCOUNTER — Encounter: Payer: Self-pay | Admitting: Physical Therapy

## 2019-07-22 DIAGNOSIS — R2689 Other abnormalities of gait and mobility: Secondary | ICD-10-CM

## 2019-07-22 DIAGNOSIS — R42 Dizziness and giddiness: Secondary | ICD-10-CM

## 2019-07-22 DIAGNOSIS — R2681 Unsteadiness on feet: Secondary | ICD-10-CM

## 2019-07-22 NOTE — Patient Instructions (Addendum)
Gaze Stabilization - Tip Card  1.Target must remain in focus, not blurry, and appear stationary while head is in motion. 2.Perform exercises with small head movements (45 to either side of midline). 3.Increase speed of head motion so long as target is in focus. 4.If you wear eyeglasses, be sure you can see target through lens (therapist will give specific instructions for bifocal / progressive lenses). 5.These exercises may provoke dizziness or nausea. Work through these symptoms. If too dizzy, slow head movement slightly. Rest between each exercise. 6.Exercises demand concentration; avoid distractions. 7.For safety, perform standing exercises close to a counter, wall, corner, or next to someone.  Copyright  [employer]. All rights reserved.   Gaze Stabilization - Standing Feet Apart   Feet shoulder width apart, keeping eyes on target on wall 3 feet away.  Ground down through hands and feet before starting the head movement.  Tilt head down slightly and move head side to side for 25 repetitions.  Ground down again at the end.    Stand with your back supported against a wall; Repeat while moving head up and down for 20 repetitions slowly, ground down at the end.   Perform in the morning and in the evening.  _____________________________________________________________________________________________________ Week 1 - Perform this exercise with hands touching counter, chair behind you  Feet Apart, Head Motion - Eyes Closed    With eyes closed and feet shoulder width apart, move head slowly, SIDE TO SIDE 10 times focusing on your balance.   Repeat __2__ times per session. Do __1__ sessions per day MID DAY

## 2019-07-22 NOTE — Therapy (Signed)
[facility] [address] [place], [place], [address] Phone: [phone]   Fax:  [phone]  Physical Therapy Treatment  Patient Details  Name: Amanda Cannon MRN: [id number] Date of Birth: 12-Apr-1945 Referring Provider (PT): Presley Raddle, MD   Encounter Date: 07/22/2019   PT End of Session - 07/22/19 8676    Visit Number 12   2 neck, 10 vestibular   Number of Visits 21    Date for PT Re-Evaluation 09/03/19   09/09/19 neck   Authorization Type Medicare.  POC for neck 09/09/19    Progress Note Due on Visit 19    PT Start Time 1455    PT Stop Time 1537    PT Time Calculation (min) 42 min    Activity Tolerance Patient tolerated treatment well    Behavior During Therapy WFL for tasks assessed/performed           Past Medical History:  Diagnosis Date  . Alcohol use   . Anemia   . Anxiety   . Asthma   . Basal cell carcinoma    forehead  . Breast cancer ([place]) 1998   left   . Colon polyps   . GERD (gastroesophageal reflux disease)   . Glaucoma   . Hyperlipidemia   . Hypertension   . Hyponatremia    remote, mild  . Mitral valve prolapse   . Osteoarthritis    sees rheumatologist  . Osteoporosis    sees rheumatologist  . Personal history of radiation therapy 1998  . PMR (polymyalgia rheumatica) (HCC)    sees rheumatologist  . Pneumonia   . Prinzmetal angina ([place])   . White matter changes    ? ischemic sm vs per pt    Past Surgical History:  Procedure Laterality Date  . ABDOMINAL HYSTERECTOMY    . BREAST LUMPECTOMY Left 1998   cancer  . CESAREAN SECTION     x 4  . COLONOSCOPY  07/2012   in [place]  . HERNIA REPAIR  2005   abdominal hernia repair  . HYSTEROTOMY    . LYMPH NODE DISSECTION Left   . POLYPECTOMY      There were no vitals filed for this visit.   Subjective Assessment - 07/22/19 1458    Subjective Pt felt like the needling was very helpful but feels like the muscles are clenching  back up.  Felt like she had more ROM for vestibular exercises.  Turned around in the bathroom at the clinic and got dizzy.    Pertinent History bilateral symmetric sensorineural hearing loss:  bilateral occasional tinnitus; motion sickness; glaucoma; cataract lenses; HTN: osteoporosis; arthritis; trigeminal neuropathy:  polymyalgia rheumatica:  anxiety/depression    Patient Stated Goals improve cervical mobility    Currently in Pain? No/denies    Pain Onset More than a month ago                              Vestibular Treatment/Exercise - 07/22/19 1654      Vestibular Treatment/Exercise   Vestibular Treatment Provided Gaze    Gaze Exercises X1 Viewing Horizontal;X1 Viewing Vertical      X1 Viewing Horizontal   Foot Position solid surface, feet apart    Reps 20    Comments no UE support, increased from 20 to 25 reps; mild symptoms, good balance      X1 Viewing Vertical   Foot Position solid surface, feet apart  Reps 20    Comments increased difficulty coordinating vertical head movement without adding in shoulder/trunk flexion/extension and extra head movements; reporting increased dizziness.  Changed to standing with back to a wall for support/stability and proprioceptive feedback while performing vertical gaze exercise.  Pt demonstrated improved technique and improved balance.              Balance Exercises - 07/22/19 1657      Balance Exercises: Standing   Standing Eyes Opened Wide (BOA);Solid surface;Head turns;Other (comment)    Standing Eyes Closed Wide (BOA);Head turns;Solid surface;Other reps (comment)   10 reps side to side; unable to do up/down   Other Standing Exercises When performing corner balance on solid surface with EC pt able to perform head turns horizontally with mild difficulty, no dizziness.  When attempting to perform vertical head movements pt began to demonstrate increased dizziness, head fullness, and significant sway forwards and  backwards and extra head movements, unable to maintain stability.  Attempted to provide pt with greater stability and proprioceptive input by having pt stand with back to wall, EO and head against foam roll or ball against wall to guide pure vertical head movement; pt continued to have difficulty but no dizziness or fullness in head.  Did not provide for home.               PT Education - 07/22/19 1827    Education Details revised HEP    Person(s) Educated Patient    Methods Explanation;Demonstration;Handout    Comprehension Verbalized understanding;Returned demonstration          Gaze Stabilization - Tip Card  1.Target must remain in focus, not blurry, and appear stationary while head is in motion. 2.Perform exercises with small head movements (45 to either side of midline). 3.Increase speed of head motion so long as target is in focus. 4.If you wear eyeglasses, be sure you can see target through lens (therapist will give specific instructions for bifocal / progressive lenses). 5.These exercises may provoke dizziness or nausea. Work through these symptoms. If too dizzy, slow head movement slightly. Rest between each exercise. 6.Exercises demand concentration; avoid distractions. 7.For safety, perform standing exercises close to a counter, wall, corner, or next to someone.  Copyright  [employer]. All rights reserved.   Gaze Stabilization - Standing Feet Apart   Feet shoulder width apart, keeping eyes on target on wall 3 feet away.  Ground down through hands and feet before starting the head movement.  Tilt head down slightly and move head side to side for 25 repetitions.  Ground down again at the end.    Stand with your back supported against a wall; Repeat while moving head up and down for 20 repetitions slowly, ground down at the end.   Perform in the morning and in the  evening.  _____________________________________________________________________________________________________ Week 1 - Perform this exercise with hands touching counter, chair behind you  Feet Apart, Head Motion - Eyes Closed    With eyes closed and feet shoulder width apart, move head slowly, SIDE TO SIDE 10 times focusing on your balance.   Repeat __2__ times per session. Do __1__ sessions per day       PT Short Term Goals - 07/15/19 0940      PT SHORT TERM GOAL #1   Title Pt will be independent with ongoing vestibular and balance HEP    Time 4    Period Weeks    Status On-going    Target Date 08/04/19  PT SHORT TERM GOAL #2   Title Pt will improve FGA by 2 points with improved balance while performing gait with head turns and nods    Baseline 21/30    Time 4    Period Weeks    Status New    Target Date 08/04/19      PT SHORT TERM GOAL #3   Title Pt will report mild dizziness with head turns, nods, body turns and bending down to ground    Baseline moderate dizziness    Time 4    Period Weeks    Status New    Target Date 08/04/19      PT SHORT TERM GOAL #4   Title be independent in initial HEP for neck    Time 4    Period Weeks    Status New    Target Date 08/12/19      PT SHORT TERM GOAL #5   Title demonstrate > or = to 40 degrees of cervical A/ROM rotation bilaterally to improve safety with driving    Time 4    Period Weeks    Status New    Target Date 08/12/19             PT Long Term Goals - 07/15/19 0943      PT LONG TERM GOAL #1   Title Decrease DHI by 10 points to indicate improvement in dizziness for improved quality of life.    Baseline DHI: 36%    Time 4    Period Weeks    Status Revised      PT LONG TERM GOAL #2   Title Increase FGA score to >/= 24/30 points to demo improved balance during gait for incr. safety.    Baseline 21/30    Time 4    Period Weeks    Status Revised      PT LONG TERM GOAL #3   Title Independent in  HEP for balance and vestibular exercises.    Baseline 4 line difference on 05-09-19 > 3 line difference > 2 line difference    Time 8    Period Weeks    Status New      PT LONG TERM GOAL #4   Title Report symptoms no greater than 1/5 on MSQ for head turns/nods, body turns, and bending down to the ground to allow pt to be more independent with household activities    Period Weeks    Status New      PT LONG TERM GOAL #5   Title be independent in advanced HEP for neck pain    Time 8    Period Weeks    Status New    Target Date 09/09/19      PT LONG TERM GOAL #6   Title reduce NDI to < or = to 10% to improve function with daily tasks    Time 8    Period Weeks    Status New    Target Date 09/09/19      PT LONG TERM GOAL #7   Title improve postural strength to sit with neutral/upright posture > or = to 75% of the time    Time 8    Period Weeks    Status New    Target Date 09/09/19      PT LONG TERM GOAL #8   Title demonstrate cervical A/ROM rotation to > or = to 50 degrees to improve safety with driving    Time 8  Period Weeks    Status New    Target Date 09/09/19      PT LONG TERM GOAL  #9   TITLE imrpove UE strength to > or = to 4-/5 to improve endurance for functional use    Time 8    Period Weeks    Status New    Target Date 09/09/19                 Plan - 07/22/19 1834    Clinical Impression Statement Pt reporting improvement in ROM after dry needling but continues to have dizziness with head movements, turns and bending down to the floor.  Focused on review and revision of HEP provided to patient before she left for [place].  Pt continues to have significant difficulty with vertical head movements and postural control.  Pt is improving with horizontal head turns.  Will continue to address in order to progress towards LTG.    Personal Factors and Comorbidities Comorbidity 2;Past/Current Experience;Time since onset of injury/illness/exacerbation    Comorbidities  trigeminal neuropathy, polymyalgia rheumatica, HTN, depression, arthritis, mitral valve prolapse; mild inferior abrasion in Rt external auditory canal per audiologist's report from 04-03-19 visit:  tinnitus, motion sickness, glaaucoma, sensorineural hearing loss, h/o breast cancer    Examination-Activity Limitations Locomotion Level;Transfers;Bend;Stairs;Squat;Stand    Examination-Participation Restrictions Meal Prep;Cleaning;Community Activity;Driving;Shop    Rehab Potential Good    PT Frequency 2x / week    PT Duration 8 weeks    PT Treatment/Interventions ADLs/Self Care Home Management;Vestibular;Gait training;Therapeutic activities;Therapeutic exercise;Balance training;Neuromuscular re-education;Patient/family education;Aquatic Therapy;Functional mobility training    PT Next Visit Plan Vestibular plan - [place] - she still has a hard time sequencing vertical head movements - I had her try it with her head resting on a ball on the wall, may need to have her sit down and perform and then return to standing.  Continue to work on postural control/grounding, stability with turning/pivoting, gait with head turns, backwards walking, balance with eyes closed, bending down to ground    Consulted and Agree with Plan of Care Patient           Patient will benefit from skilled therapeutic intervention in order to improve the following deficits and impairments:  Difficulty walking, Decreased balance, Impaired vision/preception, Dizziness  Visit Diagnosis: Unsteadiness on feet  Dizziness and giddiness  Other abnormalities of gait and mobility     Problem List Patient Active Problem List   Diagnosis Date Noted  . Trigeminal neuropathy 11/13/2017  . PMR (polymyalgia rheumatica) (HCC) 04/24/2017  . Hyponatremia 07/15/2015  . Hyperlipidemia 07/15/2015  . Esophageal spasm 07/15/2015  . Essential hypertension 07/15/2015  . Depression 07/15/2015  . Asthma 07/15/2015  . Tinnitus 07/15/2015  .  Osteoporosis 07/15/2015  . Spasm   . Prinzmetal angina ([facility])   . Mitral valve prolapse   . White matter changes     Rico Junker, PT, DPT 07/22/19    6:39 PM    [facility] [address] [place], [place], [address] Phone: [phone]   Fax:  [phone]  Name: Amanda Cannon MRN: [id number] Date of Birth: Jun 16, 1945

## 2019-07-23 ENCOUNTER — Ambulatory Visit: Payer: Medicare Other | Admitting: Physical Therapy

## 2019-07-23 ENCOUNTER — Encounter: Payer: Self-pay | Admitting: Physical Therapy

## 2019-07-23 DIAGNOSIS — R42 Dizziness and giddiness: Secondary | ICD-10-CM

## 2019-07-23 DIAGNOSIS — R2681 Unsteadiness on feet: Secondary | ICD-10-CM | POA: Diagnosis not present

## 2019-07-23 DIAGNOSIS — R2689 Other abnormalities of gait and mobility: Secondary | ICD-10-CM

## 2019-07-23 NOTE — Therapy (Signed)
[facility] [address] [place], [place], [address] Phone: [phone]   Fax:  [phone]  Physical Therapy Treatment  Patient Details  Name: Amanda Cannon MRN: [id number] Date of Birth: January 29, 1945 Referring Provider (PT): Presley Raddle, MD   Encounter Date: 07/23/2019   PT End of Session - 07/23/19 2317    Visit Number 13   11 vestibular   Number of Visits 21    Date for PT Re-Evaluation 09/03/19   09/09/19 neck   Authorization Type Medicare.  POC for neck 09/09/19    Progress Note Due on Visit 19    PT Start Time 1402    PT Stop Time 1446    PT Time Calculation (min) 44 min    Activity Tolerance Patient tolerated treatment well    Behavior During Therapy WFL for tasks assessed/performed           Past Medical History:  Diagnosis Date  . Alcohol use   . Anemia   . Anxiety   . Asthma   . Basal cell carcinoma    forehead  . Breast cancer ([facility]) 1998   left   . Colon polyps   . GERD (gastroesophageal reflux disease)   . Glaucoma   . Hyperlipidemia   . Hypertension   . Hyponatremia    remote, mild  . Mitral valve prolapse   . Osteoarthritis    sees rheumatologist  . Osteoporosis    sees rheumatologist  . Personal history of radiation therapy 1998  . PMR (polymyalgia rheumatica) (HCC)    sees rheumatologist  . Pneumonia   . Prinzmetal angina ([place])   . White matter changes    ? ischemic sm vs per pt    Past Surgical History:  Procedure Laterality Date  . ABDOMINAL HYSTERECTOMY    . BREAST LUMPECTOMY Left 1998   cancer  . CESAREAN SECTION     x 4  . COL[place]COPY  07/2012   in [place]  . HERNIA REPAIR  2005   abdominal hernia repair  . HYSTEROTOMY    . LYMPH NODE DISSECTION Left   . POLYPECTOMY      There were no vitals filed for this visit.   Subjective Assessment - 07/23/19 1407    Subjective Pt states the dry needling is helping but feels the muscles are tightening up again; states  the exercises with the eyes closed "are really bad" - are very challenging    Pertinent History bilateral symmetric sensorineural hearing loss:  bilateral occasional tinnitus; motion sickness; glaucoma; cataract lenses; HTN: osteoporosis; arthritis; trigeminal neuropathy:  polymyalgia rheumatica:  anxiety/depression    Patient Stated Goals improve cervical mobility    Currently in Pain? No/denies    Pain Onset More than a month ago                              Vestibular Treatment/Exercise - 07/23/19 0001      Vestibular Treatment/Exercise   Vestibular Treatment Provided Gaze    Gaze Exercises X1 Viewing Horizontal;X1 Viewing Vertical      X1 Viewing Horizontal   Foot Position bil. stance - 30 secs - chair in front for UE support prn    Time --   30 secs   Reps 1    Comments Performed initially in seated position - cues for technique; 30 secs 2 reps      X1 Viewing Vertical   Foot Position bil.  stance - chair in front for UE support     Time --   30 secs   Reps 2    Comments cues for technique              Balance Exercises - 07/23/19 0001      Balance Exercises: Standing   Standing Eyes Opened Wide (BOA);Head turns;Solid surface;5 reps    Standing Eyes Closed Wide (BOA);Head turns;Solid surface;5 reps    Marching Solid surface;Head turns;Static;5 reps    Other Standing Exercises Pt performed trunk rotations standing in corner on floor - straight across and then diagonal pattern "X" 5 reps each; EO first and then with [facility]             PT Education - 07/23/19 2316    Education Details added marching (on floor) to HEP with EO with and without head turns: EC without head turns;    Person(s) Educated Patient    Methods Explanation;Demonstration;Handout    Comprehension Verbalized understanding;Returned demonstration            PT Short Term Goals - 07/23/19 2323      PT SHORT TERM GOAL #1   Title Pt will be independent with ongoing vestibular  and balance HEP    Time 4    Period Weeks    Status On-going    Target Date 08/04/19      PT SHORT TERM GOAL #2   Title Pt will improve FGA by 2 points with improved balance while performing gait with head turns and nods    Baseline 21/30    Time 4    Period Weeks    Status New    Target Date 08/04/19      PT SHORT TERM GOAL #3   Title Pt will report mild dizziness with head turns, nods, body turns and bending down to ground    Baseline moderate dizziness    Time 4    Period Weeks    Status New    Target Date 08/04/19      PT SHORT TERM GOAL #4   Title be independent in initial HEP for neck    Time 4    Period Weeks    Status New    Target Date 08/12/19      PT SHORT TERM GOAL #5   Title demonstrate > or = to 40 degrees of cervical A/ROM rotation bilaterally to improve safety with driving    Time 4    Period Weeks    Status New    Target Date 08/12/19             PT Long Term Goals - 07/23/19 2324      PT LONG TERM GOAL #1   Title Decrease DHI by 10 points to indicate improvement in dizziness for improved quality of life.    Baseline DHI: 36%    Time 4    Period Weeks    Status Revised      PT LONG TERM GOAL #2   Title Increase FGA score to >/= 24/30 points to demo improved balance during gait for incr. safety.    Baseline 21/30    Time 4    Period Weeks    Status Revised      PT LONG TERM GOAL #3   Title Independent in HEP for balance and vestibular exercises.    Baseline 4 line difference on 05-09-19 > 3 line difference > 2 line difference    Time  8    Period Weeks    Status New      PT LONG TERM GOAL #4   Title Report symptoms no greater than 1/5 on MSQ for head turns/nods, body turns, and bending down to the ground to allow pt to be more independent with household activities    Period Weeks    Status New      PT LONG TERM GOAL #5   Title be independent in advanced HEP for neck pain    Time 8    Period Weeks    Status New      PT LONG TERM  GOAL #6   Title reduce NDI to < or = to 10% to improve function with daily tasks    Time 8    Period Weeks    Status New      PT LONG TERM GOAL #7   Title improve postural strength to sit with neutral/upright posture > or = to 75% of the time    Time 8    Period Weeks    Status New      PT LONG TERM GOAL #8   Title demonstrate cervical A/ROM rotation to > or = to 50 degrees to improve safety with driving    Time 8    Period Weeks    Status New      PT LONG TERM GOAL  #9   TITLE imrpove UE strength to > or = to 4-/5 to improve endurance for functional use    Time 8    Period Weeks    Status New                 Plan - 07/23/19 1359    Clinical Impression Statement Pt reports more dizziness provoked with x1 viewing with vertical head turns - in seated as well as in standing position.  Pt needed verbal cues initially to perform exercise correctly, as she was not maintaining gaze on the target.  Pt noted to move en bloc with head turns in standing rather than having head trunk dissociation.  Pt states she loses all orientation during exercises with EC.    Personal Factors and Comorbidities Comorbidity 2;Past/Current Experience;Time since onset of injury/illness/exacerbation    Comorbidities trigeminal neuropathy, polymyalgia rheumatica, HTN, depression, arthritis, mitral valve prolapse; mild inferior abrasion in Rt external auditory canal per audiologist's report from 04-03-19 visit:  tinnitus, motion sickness, glaaucoma, sensorineural hearing loss, h/o breast cancer    Examination-Activity Limitations Locomotion Level;Transfers;Bend;Stairs;Squat;Stand    Examination-Participation Restrictions Meal Prep;Cleaning;Community Activity;Driving;Shop    Rehab Potential Good    PT Frequency 2x / week    PT Duration 8 weeks    PT Treatment/Interventions ADLs/Self Care Home Management;Vestibular;Gait training;Therapeutic activities;Therapeutic exercise;Balance training;Neuromuscular  re-education;Patient/family education;Aquatic Therapy;Functional mobility training    PT Next Visit Plan Continue to work on postural control/grounding, stability with turning/pivoting, gait with head turns, backwards walking, balance with eyes closed, bending down to ground    Consulted and Agree with Plan of Care Patient           Patient will benefit from skilled therapeutic intervention in order to improve the following deficits and impairments:  Difficulty walking, Decreased balance, Impaired vision/preception, Dizziness  Visit Diagnosis: Unsteadiness on feet  Other abnormalities of gait and mobility  Dizziness and giddiness     Problem List Patient Active Problem List   Diagnosis Date Noted  . Trigeminal neuropathy 11/13/2017  . PMR (polymyalgia rheumatica) (HCC) 04/24/2017  . Hyponatremia 07/15/2015  .  Hyperlipidemia 07/15/2015  . Esophageal spasm 07/15/2015  . Essential hypertension 07/15/2015  . Depression 07/15/2015  . Asthma 07/15/2015  . Tinnitus 07/15/2015  . Osteoporosis 07/15/2015  . Spasm   . Prinzmetal angina ([place])   . Mitral valve prolapse   . White matter changes     Adeoluwa Silvers, [place], PT 07/23/2019, 11:25 PM  [facility] [address] [place], [place], [address] Phone: [phone]   Fax:  [phone]  Name: Amanda Cannon MRN: [id number] Date of Birth: 01-29-1945

## 2019-07-23 NOTE — Patient Instructions (Addendum)
Marching In-Place    Standing straight, alternate bringing knees toward trunk. Bent arms swing alternately. Do _10__ sets. Do _1__ times per day.  Progression: 1) Eyes open - no head turns                       2) Eyes open - head turns                       3) Eyes closed - no head turns                       4) Eyes closed - head turns - pt instructed to hold on this one at this time due to safety concerns

## 2019-07-25 ENCOUNTER — Other Ambulatory Visit: Payer: Self-pay

## 2019-07-25 ENCOUNTER — Ambulatory Visit: Payer: Medicare Other | Attending: Family Medicine

## 2019-07-25 ENCOUNTER — Other Ambulatory Visit: Payer: Self-pay | Admitting: Family Medicine

## 2019-07-25 DIAGNOSIS — R252 Cramp and spasm: Secondary | ICD-10-CM | POA: Insufficient documentation

## 2019-07-25 DIAGNOSIS — M6281 Muscle weakness (generalized): Secondary | ICD-10-CM | POA: Insufficient documentation

## 2019-07-25 DIAGNOSIS — R293 Abnormal posture: Secondary | ICD-10-CM | POA: Diagnosis not present

## 2019-07-25 DIAGNOSIS — M542 Cervicalgia: Secondary | ICD-10-CM | POA: Insufficient documentation

## 2019-07-25 NOTE — Therapy (Signed)
[facility] [address], [place], [place], [address] Phone: [phone]   Fax:  [phone]  Physical Therapy Treatment  Patient Details  Name: Amanda Cannon MRN: [id number] Date of Birth: 1945/07/24 Referring Provider (PT): Presley Raddle, MD   Encounter Date: 07/25/2019   PT End of Session - 07/25/19 0813    Visit Number 14   11 vestibular, 3 neck   Date for PT Re-Evaluation 09/03/19   09/09/19- neck   Authorization Type Medicare.  POC for neck 09/09/19    Progress Note Due on Visit 19    PT Start Time 0731    PT Stop Time 0818    PT Time Calculation (min) 47 min    Activity Tolerance Patient tolerated treatment well    Behavior During Therapy [facility] for tasks assessed/performed           Past Medical History:  Diagnosis Date  . Alcohol use   . Anemia   . Anxiety   . Asthma   . Basal cell carcinoma    forehead  . Breast cancer ([place]) 1998   left   . Colon polyps   . GERD (gastroesophageal reflux disease)   . Glaucoma   . Hyperlipidemia   . Hypertension   . Hyponatremia    remote, mild  . Mitral valve prolapse   . Osteoarthritis    sees rheumatologist  . Osteoporosis    sees rheumatologist  . Personal history of radiation therapy 1998  . PMR (polymyalgia rheumatica) (HCC)    sees rheumatologist  . Pneumonia   . Prinzmetal angina ([place])   . White matter changes    ? ischemic sm vs per pt    Past Surgical History:  Procedure Laterality Date  . ABDOMINAL HYSTERECTOMY    . BREAST LUMPECTOMY Left 1998   cancer  . CESAREAN SECTION     x 4  . COL[place]COPY  07/2012   in [place]  . HERNIA REPAIR  2005   abdominal hernia repair  . HYSTEROTOMY    . LYMPH NODE DISSECTION Left   . POLYPECTOMY      There were no vitals filed for this visit.   Subjective Assessment - 07/25/19 0732    Subjective The needling helped me- it helped for 4-5 days.  I am working on sitting up straighter.    Currently in  Pain? Yes    Pain Score 2     Pain Location Neck    Pain Orientation Right;Left    Pain Descriptors / Indicators Tightness    Pain Type Chronic pain    Pain Onset More than a month ago    Aggravating Factors  it is just stiff    Pain Relieving Factors not moving head              [facility] PT Assessment - 07/25/19 0001      AROM   Overall AROM  Deficits    Cervical - Right Rotation 45                         [facility] Adult PT Treatment/Exercise - 07/25/19 0001      Modalities   Modalities Moist Heat      Moist Heat Therapy   Number Minutes Moist Heat 10 Minutes    Moist Heat Location Cervical      Manual Therapy   Manual Therapy Soft tissue mobilization;Myofascial release    Manual therapy comments elongation  and trigger point release to bil neck and upper traps    Soft tissue mobilization P/ROM into rotation and sidebending            Trigger Point Dry Needling - 07/25/19 0001    Consent Given? Yes    Muscles Treated Head and Neck Upper trapezius;Cervical multifidi;Suboccipitals    Upper Trapezius Response Twitch reponse elicited;Palpable increased muscle length    Suboccipitals Response Twitch response elicited;Palpable increased muscle length    Cervical multifidi Response Twitch reponse elicited;Palpable increased muscle length                  PT Short Term Goals - 07/23/19 2323      PT SHORT TERM GOAL #1   Title Pt will be independent with ongoing vestibular and balance HEP    Time 4    Period Weeks    Status On-going    Target Date 08/04/19      PT SHORT TERM GOAL #2   Title Pt will improve FGA by 2 points with improved balance while performing gait with head turns and nods    Baseline 21/30    Time 4    Period Weeks    Status New    Target Date 08/04/19      PT SHORT TERM GOAL #3   Title Pt will report mild dizziness with head turns, nods, body turns and bending down to ground    Baseline moderate dizziness    Time 4     Period Weeks    Status New    Target Date 08/04/19      PT SHORT TERM GOAL #4   Title be independent in initial HEP for neck    Time 4    Period Weeks    Status New    Target Date 08/12/19      PT SHORT TERM GOAL #5   Title demonstrate > or = to 40 degrees of cervical A/ROM rotation bilaterally to improve safety with driving    Time 4    Period Weeks    Status New    Target Date 08/12/19             PT Long Term Goals - 07/23/19 2324      PT LONG TERM GOAL #1   Title Decrease DHI by 10 points to indicate improvement in dizziness for improved quality of life.    Baseline DHI: 36%    Time 4    Period Weeks    Status Revised      PT LONG TERM GOAL #2   Title Increase FGA score to >/= 24/30 points to demo improved balance during gait for incr. safety.    Baseline 21/30    Time 4    Period Weeks    Status Revised      PT LONG TERM GOAL #3   Title Independent in HEP for balance and vestibular exercises.    Baseline 4 line difference on 05-09-19 > 3 line difference > 2 line difference    Time 8    Period Weeks    Status New      PT LONG TERM GOAL #4   Title Report symptoms no greater than 1/5 on MSQ for head turns/nods, body turns, and bending down to the ground to allow pt to be more independent with household activities    Period Weeks    Status New      PT LONG TERM GOAL #5   Title  be independent in advanced HEP for neck pain    Time 8    Period Weeks    Status New      PT LONG TERM GOAL #6   Title reduce NDI to < or = to 10% to improve function with daily tasks    Time 8    Period Weeks    Status New      PT LONG TERM GOAL #7   Title improve postural strength to sit with neutral/upright posture > or = to 75% of the time    Time 8    Period Weeks    Status New      PT LONG TERM GOAL #8   Title demonstrate cervical A/ROM rotation to > or = to 50 degrees to improve safety with driving    Time 8    Period Weeks    Status New      PT LONG TERM GOAL   #9   TITLE imrpove UE strength to > or = to 4-/5 to improve endurance for functional use    Time 8    Period Weeks    Status New                 Plan - 07/25/19 3151    Clinical Impression Statement Pt is independent and complaint with HEP for flexibility and is making postural corrections at home.  Cervical rotation A/ROM is improved to the Rt and Lt.  Pt has significant improvement in tissue mobility in the upper traps and suboccipitals today as compared to last week.  Pt demonstrated full cervical P/ROM in supine with manual therapy today.  Pt with improved tissue mobility after dry needling and manual therapy.  Pt will continue to benefit from skilled PT to address neck pain, mobility and postural strength.    PT Frequency 2x / week    PT Duration 8 weeks    PT Treatment/Interventions ADLs/Self Care Home Management;Vestibular;Gait training;Therapeutic activities;Therapeutic exercise;Balance training;Neuromuscular re-education;Patient/family education;Aquatic Therapy;Functional mobility training    PT Next Visit Plan instruct pt on Melt Method for neck, add to HEP for postural strength, levator stretch, quadrant stretches, thoracic mobility    PT Home Exercise Plan x1 viewng and corner balance; progressed x1 viewing to standing position and added marching on pillows to HEP on 05-23-19, Access Code: [email or web address] for neck    Consulted and Agree with Plan of Care Patient           Patient will benefit from skilled therapeutic intervention in order to improve the following deficits and impairments:  Difficulty walking, Decreased balance, Impaired vision/preception, Dizziness  Visit Diagnosis: Cervicalgia  Cramp and spasm  Abnormal posture  Muscle weakness (generalized)     Problem List Patient Active Problem List   Diagnosis Date Noted  . Trigeminal neuropathy 11/13/2017  . PMR (polymyalgia rheumatica) (HCC) 04/24/2017  . Hyponatremia 07/15/2015  . Hyperlipidemia 07/15/2015    . Esophageal spasm 07/15/2015  . Essential hypertension 07/15/2015  . Depression 07/15/2015  . Asthma 07/15/2015  . Tinnitus 07/15/2015  . Osteoporosis 07/15/2015  . Spasm   . Prinzmetal angina ([place])   . Mitral valve prolapse   . White matter changes      Sigurd Sos, PT 07/25/19 8:18 AM  [facility] [address], [place], [place], [address] Phone: [phone]   Fax:  [phone]  Name: Amanda Cannon MRN: [id number] Date of Birth: 1945-04-17

## 2019-07-30 ENCOUNTER — Ambulatory Visit: Payer: Medicare Other | Admitting: Physical Therapy

## 2019-07-31 ENCOUNTER — Other Ambulatory Visit: Payer: Self-pay

## 2019-07-31 ENCOUNTER — Ambulatory Visit: Payer: Medicare Other | Attending: Otolaryngology | Admitting: Physical Therapy

## 2019-07-31 ENCOUNTER — Encounter: Payer: Self-pay | Admitting: Physical Therapy

## 2019-07-31 DIAGNOSIS — R2681 Unsteadiness on feet: Secondary | ICD-10-CM | POA: Insufficient documentation

## 2019-07-31 DIAGNOSIS — R2689 Other abnormalities of gait and mobility: Secondary | ICD-10-CM | POA: Diagnosis not present

## 2019-07-31 DIAGNOSIS — R42 Dizziness and giddiness: Secondary | ICD-10-CM | POA: Insufficient documentation

## 2019-07-31 NOTE — Therapy (Signed)
[facility] [address] [place], [place], [address] Phone: [phone]   Fax:  [phone]  Physical Therapy Treatment  Patient Details  Name: Amanda Cannon MRN: [id number] Date of Birth: [birth year] Referring Provider (PT): Presley Raddle, MD   Encounter Date: 07/31/2019   PT End of Session - 07/31/19 1731    Visit Number 15   12 vestibular, 3 neck   Number of Visits 21    Date for PT Re-Evaluation 09/03/19   09/09/19- neck   Authorization Type Medicare.  POC for neck 09/09/19    Progress Note Due on Visit 19    PT Start Time 1403    PT Stop Time 1451    PT Time Calculation (min) 48 min    Activity Tolerance Patient tolerated treatment well    Behavior During Therapy WFL for tasks assessed/performed           Past Medical History:  Diagnosis Date  . Alcohol use   . Anemia   . Anxiety   . Asthma   . Basal cell carcinoma    forehead  . Breast cancer ([place]) 1998   left   . Colon polyps   . GERD (gastroesophageal reflux disease)   . Glaucoma   . Hyperlipidemia   . Hypertension   . Hyponatremia    remote, mild  . Mitral valve prolapse   . Osteoarthritis    sees rheumatologist  . Osteoporosis    sees rheumatologist  . Personal history of radiation therapy 1998  . PMR (polymyalgia rheumatica) (HCC)    sees rheumatologist  . Pneumonia   . Prinzmetal angina ([place])   . White matter changes    ? ischemic sm vs per pt    Past Surgical History:  Procedure Laterality Date  . ABDOMINAL HYSTERECTOMY    . BREAST LUMPECTOMY Left 1998   cancer  . CESAREAN SECTION     x 4  . [place]  07/2012   in [place]  . HERNIA REPAIR  2005   abdominal hernia repair  . HYSTEROTOMY    . LYMPH NODE DISSECTION Left   . POLYPECTOMY      There were no vitals filed for this visit.   Subjective Assessment - 07/31/19 1409    Subjective Appointment was cancelled yesterday; dry needling and therapy for neck continues to  go well but not sure how much more she can do of the vestibular rehab.  Is feeling frustrated.    Pertinent History bilateral symmetric sensorineural hearing loss:  bilateral occasional tinnitus; motion sickness; glaucoma; cataract lenses; HTN: osteoporosis; arthritis; trigeminal neuropathy:  polymyalgia rheumatica:  anxiety/depression    Patient Stated Goals improve cervical mobility    Currently in Pain? No/denies    Pain Onset More than a month ago              [facility] PT Assessment - 07/31/19 1422      Functional Gait  Assessment   Gait assessed  Yes    Gait Level Surface Walks 20 ft in less than 7 sec but greater than 5.5 sec, uses assistive device, slower speed, mild gait deviations, or deviates 6-10 in outside of the 12 in walkway width.    Change in Gait Speed Able to smoothly change walking speed without loss of balance or gait deviation. Deviate no more than 6 in outside of the 12 in walkway width.    Gait with Horizontal Head Turns Performs head turns smoothly with slight change in  gait velocity (eg, minor disruption to smooth gait path), deviates 6-10 in outside 12 in walkway width, or uses an assistive device.    Gait with Vertical Head Turns Performs task with slight change in gait velocity (eg, minor disruption to smooth gait path), deviates 6 - 10 in outside 12 in walkway width or uses assistive device    Gait and Pivot Turn Pivot turns safely within 3 sec and stops quickly with no loss of balance.    Step Over Obstacle Is able to step over 2 stacked shoe boxes taped together (9 in total height) without changing gait speed. No evidence of imbalance.    Gait with Narrow Base of Support Is able to ambulate for 10 steps heel to toe with no staggering.    Gait with Eyes Closed Walks 20 ft, slow speed, abnormal gait pattern, evidence for imbalance, deviates 10-15 in outside 12 in walkway width. Requires more than 9 sec to ambulate 20 ft.    Ambulating Backwards Walks 20 ft, uses  assistive device, slower speed, mild gait deviations, deviates 6-10 in outside 12 in walkway width.    Steps Alternating feet, no rail.    Total Score 24    FGA comment: 24/30 mild hip pain; mild dizziness               Vestibular Assessment - 07/31/19 1412      Visual Acuity   Static 8    Dynamic 6      Positional Sensitivities   Nose to Right Knee No dizziness    Right Knee to Sitting Mild dizziness    Nose to Left Knee No dizziness    Left Knee to Sitting Mild dizziness    Head Turning x 5 Mild dizziness    Head Nodding x 5 Mild dizziness    Pivot Right in Standing No dizziness    Pivot Left in Standing No dizziness             Balance Exercises - 07/31/19 1756      Balance Exercises: Standing   Standing Eyes Closed Wide (BOA);Head turns;Solid surface;Other reps (comment)   10 reps head nods/turns; touching wall   Other Standing Exercises After performing head movements with EC in corner, cued pt to change visual focus from far target in background <> near target in foreground x 3 reps with pt reporting improvement in symptoms of fullness or dizziness after performing corner balance.  Pt also demonstrated decreased sway and hip hinging when she used UE to touch the wall behind her instead of chair in front.               PT Education - 07/31/19 1729    Education Details added vertical head movements to HEP and educated on use of far and near gaze focus after performing corner balance; discussed purpose of exercises to target sensory integration, increased use of proprioceptive input and improve postural control; progress towards goals    Person(s) Educated Patient    Methods Explanation;Demonstration;Handout    Comprehension Verbalized understanding;Returned demonstration            PT Short Term Goals - 07/31/19 1412      PT SHORT TERM GOAL #1   Title Pt will be independent with ongoing vestibular and balance HEP    Time 4    Period Weeks    Status  Achieved    Target Date 08/04/19      PT SHORT TERM GOAL #2  Title Pt will improve FGA by 2 points with improved balance while performing gait with head turns and nods    Baseline 21/30 > 24/30    Time 4    Period Weeks    Status Achieved    Target Date 08/04/19      PT SHORT TERM GOAL #3   Title Pt will report mild dizziness with head turns, nods, body turns and bending down to ground    Baseline mild dizziness; feels fullness in head after bending down to ground    Time 4    Period Weeks    Status Partially Met    Target Date 08/04/19      PT SHORT TERM GOAL #4   Title be independent in initial HEP for neck    Time 4    Period Weeks    Status New    Target Date 08/12/19      PT SHORT TERM GOAL #5   Title demonstrate > or = to 40 degrees of cervical A/ROM rotation bilaterally to improve safety with driving    Time 4    Period Weeks    Status New    Target Date 08/12/19             PT Long Term Goals - 07/23/19 2324      PT LONG TERM GOAL #1   Title Decrease DHI by 10 points to indicate improvement in dizziness for improved quality of life.    Baseline DHI: 36%    Time 4    Period Weeks    Status Revised      PT LONG TERM GOAL #2   Title Increase FGA score to >/= 24/30 points to demo improved balance during gait for incr. safety.    Baseline 21/30    Time 4    Period Weeks    Status Revised      PT LONG TERM GOAL #3   Title Independent in HEP for balance and vestibular exercises.    Baseline 4 line difference on 05-09-19 > 3 line difference > 2 line difference    Time 8    Period Weeks    Status New      PT LONG TERM GOAL #4   Title Report symptoms no greater than 1/5 on MSQ for head turns/nods, body turns, and bending down to the ground to allow pt to be more independent with household activities    Period Weeks    Status New      PT LONG TERM GOAL #5   Title be independent in advanced HEP for neck pain    Time 8    Period Weeks    Status New       PT LONG TERM GOAL #6   Title reduce NDI to < or = to 10% to improve function with daily tasks    Time 8    Period Weeks    Status New      PT LONG TERM GOAL #7   Title improve postural strength to sit with neutral/upright posture > or = to 75% of the time    Time 8    Period Weeks    Status New      PT LONG TERM GOAL #8   Title demonstrate cervical A/ROM rotation to > or = to 50 degrees to improve safety with driving    Time 8    Period Weeks    Status New      PT  LONG TERM GOAL  #9   TITLE imrpove UE strength to > or = to 4-/5 to improve endurance for functional use    Time 8    Period Weeks    Status New                 Plan - 07/31/19 1733    Clinical Impression Statement Performed assessment of patient's progress towards STG; pt continues to make steady progress and has met 2/3 STG for vestibular rehab.  Pt reports decreased motion sensitivity to vertical and horizontal head movements and bending down to the floor, improved balance with higher level gait challenges and decreased falls risk.  Pt continues to demonstrate impaired postural control when performing head movements and when vision is removed.  Pt was pleased with progress today and felt more encouraged by end of session.  Will continue to address and progress towards LTG.    Examination-Activity Limitations Locomotion Level;Transfers;Bend;Stairs;Squat;Stand    Examination-Participation Restrictions Meal Prep;Cleaning;Community Activity;Driving;Shop    PT Frequency 2x / week    PT Duration 8 weeks    PT Treatment/Interventions ADLs/Self Care Home Management;Vestibular;Gait training;Therapeutic activities;Therapeutic exercise;Balance training;Neuromuscular re-education;Patient/family education;Aquatic Therapy;Functional mobility training    PT Next Visit Plan instruct pt on Melt Method for neck, add to HEP for postural strength, levator stretch, quadrant stretches, thoracic mobility; Vestibular: continue to work  on habituation to bending down to ground, head turns/nods and EC balance training; rockerboard and appropriate balance strategy training.  Progress x1 viewing as indicated.    PT Home Exercise Plan Access Code: [email or web address] for neck    Consulted and Agree with Plan of Care Patient           Patient will benefit from skilled therapeutic intervention in order to improve the following deficits and impairments:  Difficulty walking, Decreased balance, Impaired vision/preception, Dizziness  Visit Diagnosis: Unsteadiness on feet  Other abnormalities of gait and mobility  Dizziness and giddiness     Problem List Patient Active Problem List   Diagnosis Date Noted  . Trigeminal neuropathy 11/13/2017  . PMR (polymyalgia rheumatica) (HCC) 04/24/2017  . Hyponatremia 07/15/2015  . Hyperlipidemia 07/15/2015  . Esophageal spasm 07/15/2015  . Essential hypertension 07/15/2015  . Depression 07/15/2015  . Asthma 07/15/2015  . Tinnitus 07/15/2015  . Osteoporosis 07/15/2015  . Spasm   . Prinzmetal angina ([place])   . Mitral valve prolapse   . White matter changes     Rico Junker, PT, DPT 07/31/19    6:02 PM    [facility] [address] [place], [place], [address] Phone: [phone]   Fax:  [phone]  Name: Amanda Cannon MRN: [id number] Date of Birth: Jul 15, 1945

## 2019-07-31 NOTE — Patient Instructions (Signed)
Gaze Stabilization - Tip Card  1.Target must remain in focus, not blurry, and appear stationary while head is in motion. 2.Perform exercises with small head movements (45 to either side of midline). 3.Increase speed of head motion so long as target is in focus. 4.If you wear eyeglasses, be sure you can see target through lens (therapist will give specific instructions for bifocal / progressive lenses). 5.These exercises may provoke dizziness or nausea. Work through these symptoms. If too dizzy, slow head movement slightly. Rest between each exercise. 6.Exercises demand concentration; avoid distractions. 7.For safety, perform standing exercises close to a counter, wall, corner, or next to someone.  Copyright  VHI. All rights reserved.   Gaze Stabilization - Standing Feet Apart   Feet shoulder width apart, keeping eyes on target on wall 3 feet away.  Ground down through hands and feet before starting the head movement.  Tilt head down slightly and move head side to side for 25 repetitions.  Ground down again at the end.    Stand with your back supported against a wall; Repeat while moving head up and down for 20 repetitions slowly, ground down at the end.   Perform in the morning and in the evening.  _____________________________________________________________________________________________________ Week 1 - Perform this exercise with hands touching counter, chair behind you  Feet Apart, Head Motion - Eyes Closed    With eyes closed and feet shoulder width apart, move head slowly, SIDE TO SIDE 10 times focusing on your balance.  Hands can touch wall behind you. After you open your eyes, focus on an object to an object close up, switch back and forth a few times.  Repeat moving head up and down slowly with eyes closed, hands touching wall behind you.  10 head nods.  Open your eyes and focus on the far and near target a few times.  Repeat __2__ times per session. Do __1__ sessions per  day MID DAY  Marching In-Place    Standing straight, alternate bringing knees toward trunk. Bent arms swing alternately. Do _10__ sets. Do _1__ times per day.  Progression: 1) Eyes open - no head turns                       2) Eyes open - head turns                       3) Eyes closed - no head turns                       4) Eyes closed - head turns - pt instructed to hold on this one at this time due to safety concerns

## 2019-08-01 ENCOUNTER — Ambulatory Visit
Admission: RE | Admit: 2019-08-01 | Discharge: 2019-08-01 | Disposition: A | Discharge status: Home / Self Care | Payer: Medicare Other | Source: Ambulatory Visit | Attending: Family Medicine | Admitting: Family Medicine

## 2019-08-01 DIAGNOSIS — Z1231 Encounter for screening mammogram for malignant neoplasm of breast: Secondary | ICD-10-CM

## 2019-08-05 ENCOUNTER — Other Ambulatory Visit: Payer: Self-pay

## 2019-08-05 ENCOUNTER — Ambulatory Visit: Payer: Medicare Other | Admitting: Physical Therapy

## 2019-08-05 ENCOUNTER — Encounter: Payer: Self-pay | Admitting: Physical Therapy

## 2019-08-05 DIAGNOSIS — R2689 Other abnormalities of gait and mobility: Secondary | ICD-10-CM

## 2019-08-05 DIAGNOSIS — R42 Dizziness and giddiness: Secondary | ICD-10-CM | POA: Diagnosis not present

## 2019-08-05 DIAGNOSIS — R2681 Unsteadiness on feet: Secondary | ICD-10-CM

## 2019-08-06 ENCOUNTER — Encounter: Payer: Self-pay | Admitting: Allergy & Immunology

## 2019-08-06 ENCOUNTER — Ambulatory Visit (INDEPENDENT_AMBULATORY_CARE_PROVIDER_SITE_OTHER): Payer: Medicare Other | Admitting: Allergy & Immunology

## 2019-08-06 VITALS — BP 118/70 | HR 79 | Temp 97.6°F

## 2019-08-06 DIAGNOSIS — J310 Chronic rhinitis: Secondary | ICD-10-CM | POA: Insufficient documentation

## 2019-08-06 DIAGNOSIS — R42 Dizziness and giddiness: Secondary | ICD-10-CM

## 2019-08-06 DIAGNOSIS — K219 Gastro-esophageal reflux disease without esophagitis: Secondary | ICD-10-CM | POA: Diagnosis not present

## 2019-08-06 DIAGNOSIS — R05 Cough: Secondary | ICD-10-CM | POA: Diagnosis not present

## 2019-08-06 DIAGNOSIS — R059 Cough, unspecified: Secondary | ICD-10-CM | POA: Insufficient documentation

## 2019-08-06 HISTORY — DX: Gastro-esophageal reflux disease without esophagitis: K21.9

## 2019-08-06 NOTE — Patient Instructions (Addendum)
1. Cough with postnasal drip (sensitization to molds) - Continue with cetrizine 10mg  twice daily.  - Continue with the vestibular rehabilitation.  - Add on azelastine one spray per nostril nightly.   2. Gastroesophageal reflux disease - Continue with pantoprazole 40mg  in the morning. - Continue with famotidine 40mg  at night.   3. Return in about 6 months (around 02/06/2020). This can be an in-person follow up visit.   Please inform us of any Emergency Department visits, hospitalizations, or changes in symptoms. Call us before going to the ED for breathing or allergy symptoms since we might be able to fit you in for a sick visit. Feel free to contact us anytime with any questions, problems, or concerns.  It was a pleasure to see you and your family again today! You guys are always a hoot!   Websites that have reliable patient information: 1. American Academy of Asthma, Allergy, and Immunology: www.aaaai.org 2. Food Allergy Research and Education (FARE): foodallergy.org 3. Mothers of Asthmatics: http://www.asthmacommunitynetwork.org 4. American College of Allergy, Asthma, and Immunology: www.acaai.org   COVID-19 Vaccine Information can be found at: ShippingScam.co.uk For questions related to vaccine distribution or appointments, please email [email or web address] or call [phone].     "Like" [place] on Facebook and Instagram for our latest updates!        Make sure you are registered to vote! If you have moved or changed any of your contact information, you will need to get this updated before voting!  In some cases, you MAY be able to register to vote online: [email or web address]

## 2019-08-06 NOTE — Therapy (Signed)
[facility] [address] [place], [place], [address] Phone: [phone]   Fax:  [phone]  Physical Therapy Treatment  Patient Details  Name: Amanda Cannon MRN: [id number] Date of Birth: 05-18-1945 Referring Provider (PT): Presley Raddle, MD   Encounter Date: 08/05/2019   PT End of Session - 08/06/19 2120    Visit Number 16   13 vestibular, 3 neck   Number of Visits 21    Date for PT Re-Evaluation 09/03/19   09/09/19- neck   Authorization Type Medicare.  POC for neck 09/09/19    Progress Note Due on Visit 19    PT Start Time 1753    PT Stop Time 1833    PT Time Calculation (min) 40 min    Activity Tolerance Patient limited by fatigue    Behavior During Therapy [facility] for tasks assessed/performed           Past Medical History:  Diagnosis Date  . Alcohol use   . Anemia   . Anxiety   . Asthma   . Basal cell carcinoma    forehead  . Breast cancer ([facility]) 1998   left   . Colon polyps   . GERD (gastroesophageal reflux disease)   . Glaucoma   . Hyperlipidemia   . Hypertension   . Hyponatremia    remote, mild  . Mitral valve prolapse   . Osteoarthritis    sees rheumatologist  . Osteoporosis    sees rheumatologist  . Personal history of radiation therapy 1998  . PMR (polymyalgia rheumatica) (HCC)    sees rheumatologist  . Pneumonia   . Prinzmetal angina ([place])   . White matter changes    ? ischemic sm vs per pt    Past Surgical History:  Procedure Laterality Date  . ABDOMINAL HYSTERECTOMY    . BREAST LUMPECTOMY Left 1998   cancer  . CESAREAN SECTION     x 4  . COL[place]COPY  07/2012   in [place]  . HERNIA REPAIR  2005   abdominal hernia repair  . HYSTEROTOMY    . LYMPH NODE DISSECTION Left   . POLYPECTOMY      There were no vitals filed for this visit.   Subjective Assessment - 08/05/19 1756    Subjective Pt has not done dry needling since last vestibular rehab; watered the lawn, pulled  weeds, cleaned out half the garage today and is very tired.  Got a little dizzy and a little off balance.    Pertinent History bilateral symmetric sensorineural hearing loss:  bilateral occasional tinnitus; motion sickness; glaucoma; cataract lenses; HTN: osteoporosis; arthritis; trigeminal neuropathy:  polymyalgia rheumatica:  anxiety/depression    Patient Stated Goals improve cervical mobility    Currently in Pain? No/denies    Pain Onset More than a month ago                                  Balance Exercises - 08/06/19 2115      Balance Exercises: Standing   Rockerboard Anterior/posterior;Lateral;Head turns;EO;EC;10 reps;Intermittent UE support    Other Standing Exercises Performed rockerboard to focus on appropriate balance reactions - ankle and hip and postural control  Performed ankle and hip strategy training with anterior/posterior rocking with EO, EC and then side to side with EO, EC x 10 each.  Required verbal, visual and tactile cues to initiate weight shift from pelvis and to minimize increased use  of head and extra compensatory corrections.  Also performed 10 reps each head turns and head nods with EO and then EC anterior/posterior and lateral with light UE support and min-mod A.  Again verbal cues required to encourage pt to not over utilize or over emphasize head movements and to focus attention on balance reactions.               PT Short Term Goals - 07/31/19 1412      PT SHORT TERM GOAL #1   Title Pt will be independent with ongoing vestibular and balance HEP    Time 4    Period Weeks    Status Achieved    Target Date 08/04/19      PT SHORT TERM GOAL #2   Title Pt will improve FGA by 2 points with improved balance while performing gait with head turns and nods    Baseline 21/30 > 24/30    Time 4    Period Weeks    Status Achieved    Target Date 08/04/19      PT SHORT TERM GOAL #3   Title Pt will report mild dizziness with head turns,  nods, body turns and bending down to ground    Baseline mild dizziness; feels fullness in head after bending down to ground    Time 4    Period Weeks    Status Partially Met    Target Date 08/04/19      PT SHORT TERM GOAL #4   Title be independent in initial HEP for neck    Time 4    Period Weeks    Status New    Target Date 08/12/19      PT SHORT TERM GOAL #5   Title demonstrate > or = to 40 degrees of cervical A/ROM rotation bilaterally to improve safety with driving    Time 4    Period Weeks    Status New    Target Date 08/12/19             PT Long Term Goals - 07/23/19 2324      PT LONG TERM GOAL #1   Title Decrease DHI by 10 points to indicate improvement in dizziness for improved quality of life.    Baseline DHI: 36%    Time 4    Period Weeks    Status Revised      PT LONG TERM GOAL #2   Title Increase FGA score to >/= 24/30 points to demo improved balance during gait for incr. safety.    Baseline 21/30    Time 4    Period Weeks    Status Revised      PT LONG TERM GOAL #3   Title Independent in HEP for balance and vestibular exercises.    Baseline 4 line difference on 05-09-19 > 3 line difference > 2 line difference    Time 8    Period Weeks    Status New      PT LONG TERM GOAL #4   Title Report symptoms no greater than 1/5 on MSQ for head turns/nods, body turns, and bending down to the ground to allow pt to be more independent with household activities    Period Weeks    Status New      PT LONG TERM GOAL #5   Title be independent in advanced HEP for neck pain    Time 8    Period Weeks    Status New  PT LONG TERM GOAL #6   Title reduce NDI to < or = to 10% to improve function with daily tasks    Time 8    Period Weeks    Status New      PT LONG TERM GOAL #7   Title improve postural strength to sit with neutral/upright posture > or = to 75% of the time    Time 8    Period Weeks    Status New      PT LONG TERM GOAL #8   Title demonstrate  cervical A/ROM rotation to > or = to 50 degrees to improve safety with driving    Time 8    Period Weeks    Status New      PT LONG TERM GOAL  #9   TITLE imrpove UE strength to > or = to 4-/5 to improve endurance for functional use    Time 8    Period Weeks    Status New                 Plan - 08/06/19 2121    Clinical Impression Statement Utilized rockerboard to faciliate increased use of ankle and hip strategies and draw greater awareness to patient's over utilization of head movements to maintain postural control and balance.  Pt continues to be heavily reliant on visual information and feedback.  Will continue to address and progress towards LTG.    Personal Factors and Comorbidities Comorbidity 2;Past/Current Experience;Time since onset of injury/illness/exacerbation    Comorbidities trigeminal neuropathy, polymyalgia rheumatica, HTN, depression, arthritis, mitral valve prolapse; mild inferior abrasion in Rt external auditory canal per audiologist's report from 04-03-19 visit:  tinnitus, motion sickness, glaaucoma, sensorineural hearing loss, h/o breast cancer    Examination-Activity Limitations Locomotion Level;Transfers;Bend;Stairs;Squat;Stand    Examination-Participation Restrictions Meal Prep;Cleaning;Community Activity;Driving;Shop    Rehab Potential Good    PT Frequency 2x / week    PT Duration 8 weeks    PT Treatment/Interventions ADLs/Self Care Home Management;Vestibular;Gait training;Therapeutic activities;Therapeutic exercise;Balance training;Neuromuscular re-education;Patient/family education;Aquatic Therapy;Functional mobility training    PT Next Visit Plan Continue to work on postural control/grounding - rockerboard, physioball; stability with turning/pivoting, gait with head turns, backwards walking, balance with eyes closed, bending down to ground    Consulted and Agree with Plan of Care Patient           Patient will benefit from skilled therapeutic  intervention in order to improve the following deficits and impairments:  Difficulty walking, Decreased balance, Impaired vision/preception, Dizziness  Visit Diagnosis: Unsteadiness on feet  Other abnormalities of gait and mobility  Dizziness and giddiness     Problem List Patient Active Problem List   Diagnosis Date Noted  . Trigeminal neuropathy 11/13/2017  . PMR (polymyalgia rheumatica) (HCC) 04/24/2017  . Hyponatremia 07/15/2015  . Hyperlipidemia 07/15/2015  . Esophageal spasm 07/15/2015  . Essential hypertension 07/15/2015  . Depression 07/15/2015  . Asthma 07/15/2015  . Tinnitus 07/15/2015  . Osteoporosis 07/15/2015  . Spasm   . Prinzmetal angina ([place])   . Mitral valve prolapse   . White matter changes     Rico Junker, PT, DPT 08/06/19    9:31 PM    [facility] [address] [place], [place], [address] Phone: [phone]   Fax:  [phone]  Name: Amanda Cannon MRN: [id number] Date of Birth: 1945/11/30

## 2019-08-06 NOTE — Progress Notes (Signed)
FOLLOW UP  Date of Service/Encounter:  08/06/19   Assessment:   Cough  Chronic rhinitis (molds) - consider intradermal testing in the future  Gastroesophageal reflux disease  Vertigo - followed by the vestibular rehab program with Dr. Benjamine Mola   Plan/Recommendations:   1. Cough with postnasal drip (sensitization to molds) - Continue with cetrizine 10mg  twice daily.  - Continue with the vestibular rehabilitation.  - Add on azelastine one spray per nostril nightly.   2. Gastroesophageal reflux disease - Continue with pantoprazole 40mg  in the morning. - Continue with famotidine 40mg  at night.   3. Return in about 6 months (around 02/06/2020). This can be an in-person follow up visit.  Subjective:   Amanda Cannon is a 74 y.o. female presenting today for follow up of  Chief Complaint  Patient presents with  . Allergic Rhinitis     pt is complaining of congestion at night. Gets worse when she lays on her left side.   . Cough    Amanda Cannon has a history of the following: Patient Active Problem List   Diagnosis Date Noted  . Trigeminal neuropathy 11/13/2017  . PMR (polymyalgia rheumatica) (HCC) 04/24/2017  . Hyponatremia 07/15/2015  . Hyperlipidemia 07/15/2015  . Esophageal spasm 07/15/2015  . Essential hypertension 07/15/2015  . Depression 07/15/2015  . Asthma 07/15/2015  . Tinnitus 07/15/2015  . Osteoporosis 07/15/2015  . Spasm   . Prinzmetal angina ([place])   . Mitral valve prolapse   . White matter changes     History obtained from: chart review and patient.  Amanda Cannon is a 74 y.o. female presenting for a follow up visit.  She was last seen in January 2021.  At that time, she continued with her postnasal drip and cough.  We recommended continuing cetirizine 10 mg daily.  We did refer her to Dr. Deeann Saint office for evaluation of vertigo.  For her reflux, would continue pantoprazole and famotidine.  She did undergo vestibular testing.  This did show an  abnormal ABR in the left ear that might be attributed to high-frequency sensorineural hearing loss.  She also had an abnormal Gans SOP test that suggested an imbalance between visual, somatosensory, and vestibular inputs for dynamic postural control.  Her cVEMP results of both ears were abnormal and attributed to dysfunction of the saccule and/or inferior vestibular nerves.  In general, she has done fairly well.  Evidently, she is getting a combination of both balance training as well as pressure-point treatments.  She does report that she is having some increased postnasal drip and congestion.  She has been using her antihistamine.  She does not want to use any steroids because she has a history of osteoporosis.  I have told her in the past that this is unlikely to affect it, but she does not want to risk it.  She has tried nasal ipratropium, which has provided some relief but then stopped working.  She has not needed steroids or antibiotics.  She remains on reflux medications.  She is very good about taking these.  Otherwise, there have been no changes to her past medical history, surgical history, family history, or social history.    Review of Systems  Constitutional: Negative.  Negative for chills, fever, malaise/fatigue and weight loss.  HENT: Positive for congestion and sinus pain. Negative for ear discharge and ear pain.        Positive for postnasal drip.  Eyes: Negative for pain, discharge and redness.  Respiratory: Negative for cough, sputum production,  shortness of breath and wheezing.   Cardiovascular: Negative.  Negative for chest pain and palpitations.  Gastrointestinal: Negative for abdominal pain, constipation, diarrhea, heartburn, nausea and vomiting.  Skin: Negative.  Negative for itching and rash.  Neurological: Negative for dizziness and headaches.  Endo/Heme/Allergies: Positive for environmental allergies. Does not bruise/bleed easily.       Objective:   Blood  pressure 118/70, pulse 79, temperature 97.6 F (36.4 C), temperature source Temporal, SpO2 98 %. There is no height or weight on file to calculate BMI.   Physical Exam:  Physical Exam Constitutional:      Appearance: She is well-developed.  HENT:     Head: Normocephalic and atraumatic.     Right Ear: Tympanic membrane, ear canal and external ear normal.     Left Ear: Tympanic membrane, ear canal and external ear normal.     Nose: No nasal deformity, septal deviation, mucosal edema or rhinorrhea.     Right Turbinates: Enlarged and swollen. Not pale.     Left Turbinates: Enlarged and swollen. Not pale.     Right Sinus: No maxillary sinus tenderness or frontal sinus tenderness.     Left Sinus: No maxillary sinus tenderness or frontal sinus tenderness.     Comments: There is mild cobblestoning.    Mouth/Throat:     Mouth: Mucous membranes are not pale and not dry.     Pharynx: Uvula midline.  Eyes:     General:        Right eye: No discharge.        Left eye: No discharge.     Conjunctiva/sclera: Conjunctivae normal.     Right eye: Right conjunctiva is not injected. No chemosis.    Left eye: Left conjunctiva is not injected. No chemosis.    Pupils: Pupils are equal, round, and reactive to light.  Cardiovascular:     Rate and Rhythm: Normal rate and regular rhythm.     Heart sounds: Normal heart sounds.  Pulmonary:     Effort: Pulmonary effort is normal. No tachypnea, accessory muscle usage or respiratory distress.     Breath sounds: Normal breath sounds. No wheezing, rhonchi or rales.     Comments: Moving air well in all lung fields Chest:     Chest wall: No tenderness.  Lymphadenopathy:     Cervical: No cervical adenopathy.  Skin:    Coloration: Skin is not pale.     Findings: No abrasion, erythema, petechiae or rash. Rash is not papular, urticarial or vesicular.  Neurological:     Mental Status: She is alert.      Diagnostic studies: none    Salvatore Marvel, MD    [facility]

## 2019-08-07 ENCOUNTER — Other Ambulatory Visit: Payer: Self-pay

## 2019-08-07 ENCOUNTER — Ambulatory Visit: Payer: Medicare Other | Admitting: Physical Therapy

## 2019-08-07 DIAGNOSIS — R252 Cramp and spasm: Secondary | ICD-10-CM

## 2019-08-07 DIAGNOSIS — M542 Cervicalgia: Secondary | ICD-10-CM

## 2019-08-07 DIAGNOSIS — R293 Abnormal posture: Secondary | ICD-10-CM

## 2019-08-07 DIAGNOSIS — M6281 Muscle weakness (generalized): Secondary | ICD-10-CM | POA: Diagnosis not present

## 2019-08-07 NOTE — Therapy (Signed)
[facility] [address], [place], [place], [address] Phone: [phone]   Fax:  [phone]  Physical Therapy Treatment  Patient Details  Name: Amanda Cannon MRN: [id number] Date of Birth: 04-Jul-1945 Referring Provider (PT): Presley Raddle, MD   Encounter Date: 08/07/2019   PT End of Session - 08/07/19 1009    Visit Number 17   vestibular 13, neck 4   Number of Visits 21    Date for PT Re-Evaluation 09/03/19    Authorization Type Medicare.  POC for neck 09/09/19    Authorization Time Period 05-09-19 - 08-06-19    Progress Note Due on Visit 19    PT Start Time 0800    PT Stop Time 0843    PT Time Calculation (min) 43 min    Activity Tolerance Patient tolerated treatment well    Behavior During Therapy [facility] for tasks assessed/performed           Past Medical History:  Diagnosis Date  . Alcohol use   . Anemia   . Anxiety   . Asthma   . Basal cell carcinoma    forehead  . Breast cancer ([place]) 1998   left   . Colon polyps   . GERD (gastroesophageal reflux disease)   . Glaucoma   . Hyperlipidemia   . Hypertension   . Hyponatremia    remote, mild  . Mitral valve prolapse   . Osteoarthritis    sees rheumatologist  . Osteoporosis    sees rheumatologist  . Personal history of radiation therapy 1998  . PMR (polymyalgia rheumatica) (HCC)    sees rheumatologist  . Pneumonia   . Prinzmetal angina ([place])   . White matter changes    ? ischemic sm vs per pt    Past Surgical History:  Procedure Laterality Date  . ABDOMINAL HYSTERECTOMY    . BREAST LUMPECTOMY Left 1998   cancer  . CESAREAN SECTION     x 4  . [place]  07/2012   [place]  . HERNIA REPAIR  2005   abdominal hernia repair  . HYSTEROTOMY    . LYMPH NODE DISSECTION Left   . POLYPECTOMY      There were no vitals filed for this visit.   Subjective Assessment - 08/07/19 1005    Subjective My neck continues to feel tight, I wish my  results after PT would stick longer. i want to work on my neck today.    Pertinent History bilateral symmetric sensorineural hearing loss:  bilateral occasional tinnitus; motion sickness; glaucoma; cataract lenses; HTN: osteoporosis; arthritis; trigeminal neuropathy:  polymyalgia rheumatica:  anxiety/depression    Patient Stated Goals improve cervical mobility    Currently in Pain? Yes    Pain Score 2     Pain Location Neck    Pain Orientation Left;Right    Pain Descriptors / Indicators Dull;Aching;Tightness    Aggravating Factors  not sure    Pain Relieving Factors resting, soft tissue work    Multiple Pain Sites No                             [facility] Adult PT Treatment/Exercise - 08/07/19 0001      Manual Therapy   Manual Therapy Soft tissue mobilization;Myofascial release    Soft tissue mobilization Bil SCM, scalnes, uppter traps, occiput, manual traction and static stretching intermittently. LT SCM very large, tender TP  PT Education - 08/07/19 1008    Education Details Add diaphragmatic breathing to her HEP, ot knew how to do but was not doing regularly    Person(s) Educated Patient    Methods Explanation    Comprehension Returned demonstration            PT Short Term Goals - 07/31/19 1412      PT SHORT TERM GOAL #1   Title Pt will be independent with ongoing vestibular and balance HEP    Time 4    Period Weeks    Status Achieved    Target Date 08/04/19      PT SHORT TERM GOAL #2   Title Pt will improve FGA by 2 points with improved balance while performing gait with head turns and nods    Baseline 21/30 > 24/30    Time 4    Period Weeks    Status Achieved    Target Date 08/04/19      PT SHORT TERM GOAL #3   Title Pt will report mild dizziness with head turns, nods, body turns and bending down to ground    Baseline mild dizziness; feels fullness in head after bending down to ground    Time 4    Period Weeks    Status  Partially Met    Target Date 08/04/19      PT SHORT TERM GOAL #4   Title be independent in initial HEP for neck    Time 4    Period Weeks    Status New    Target Date 08/12/19      PT SHORT TERM GOAL #5   Title demonstrate > or = to 40 degrees of cervical A/ROM rotation bilaterally to improve safety with driving    Time 4    Period Weeks    Status New    Target Date 08/12/19             PT Long Term Goals - 07/23/19 2324      PT LONG TERM GOAL #1   Title Decrease DHI by 10 points to indicate improvement in dizziness for improved quality of life.    Baseline DHI: 36%    Time 4    Period Weeks    Status Revised      PT LONG TERM GOAL #2   Title Increase FGA score to >/= 24/30 points to demo improved balance during gait for incr. safety.    Baseline 21/30    Time 4    Period Weeks    Status Revised      PT LONG TERM GOAL #3   Title Independent in HEP for balance and vestibular exercises.    Baseline 4 line difference on 05-09-19 > 3 line difference > 2 line difference    Time 8    Period Weeks    Status New      PT LONG TERM GOAL #4   Title Report symptoms no greater than 1/5 on MSQ for head turns/nods, body turns, and bending down to the ground to allow pt to be more independent with household activities    Period Weeks    Status New      PT LONG TERM GOAL #5   Title be independent in advanced HEP for neck pain    Time 8    Period Weeks    Status New      PT LONG TERM GOAL #6   Title reduce NDI to < or =  to 10% to improve function with daily tasks    Time 8    Period Weeks    Status New      PT LONG TERM GOAL #7   Title improve postural strength to sit with neutral/upright posture > or = to 75% of the time    Time 8    Period Weeks    Status New      PT LONG TERM GOAL #8   Title demonstrate cervical A/ROM rotation to > or = to 50 degrees to improve safety with driving    Time 8    Period Weeks    Status New      PT LONG TERM GOAL  #9   TITLE  imrpove UE strength to > or = to 4-/5 to improve endurance for functional use    Time 8    Period Weeks    Status New                 Plan - 08/07/19 1013    Clinical Impression Statement Pt"s right cervical doing better than LT. Lt SCM, scalenes thick and tender at rest compared to the RT with a pretty large trigger point along the LT SCM. Tissue length at rest improved throughout the session with good AROM demonstrated at the end. PTA instructed pt to work more regularly on her diaphragmatic breathing at home and her supine sholder press exercise, both exercises she reported she was familiar with already just not doing often.    Personal Factors and Comorbidities Comorbidity 2;Past/Current Experience;Time since onset of injury/illness/exacerbation    Comorbidities trigeminal neuropathy, polymyalgia rheumatica, HTN, depression, arthritis, mitral valve prolapse; mild inferior abrasion in Rt external auditory canal per audiologist's report from 04-03-19 visit:  tinnitus, motion sickness, glaaucoma, sensorineural hearing loss, h/o breast cancer    Examination-Activity Limitations Locomotion Level;Transfers;Bend;Stairs;Squat;Stand    Examination-Participation Restrictions Meal Prep;Cleaning;Community Activity;Driving;Shop    Stability/Clinical Decision Making Evolving/Moderate complexity    Rehab Potential Good    PT Frequency 2x / week    PT Duration 8 weeks    PT Treatment/Interventions ADLs/Self Care Home Management;Vestibular;Gait training;Therapeutic activities;Therapeutic exercise;Balance training;Neuromuscular re-education;Patient/family education;Aquatic Therapy;Functional mobility training    PT Next Visit Plan dry needling if seen by ortho PT next ortho session.    PT Home Exercise Plan Access Code: [email or web address] for neck    Consulted and Agree with Plan of Care Patient           Patient will benefit from skilled therapeutic intervention in order to improve the following deficits  and impairments:  Difficulty walking, Decreased balance, Impaired vision/preception, Dizziness  Visit Diagnosis: Cervicalgia  Cramp and spasm  Abnormal posture     Problem List Patient Active Problem List   Diagnosis Date Noted  . Vertigo 08/06/2019  . Cough 08/06/2019  . Mixed rhinitis 08/06/2019  . Gastroesophageal reflux disease 08/06/2019  . Trigeminal neuropathy 11/13/2017  . PMR (polymyalgia rheumatica) (HCC) 04/24/2017  . Hyponatremia 07/15/2015  . Hyperlipidemia 07/15/2015  . Esophageal spasm 07/15/2015  . Essential hypertension 07/15/2015  . Depression 07/15/2015  . Asthma 07/15/2015  . Tinnitus 07/15/2015  . Osteoporosis 07/15/2015  . Spasm   . Prinzmetal angina (HCC)   . Mitral valve prolapse   . White matter changes     Jasani Dolney, PTA 08/07/2019, 10:18 AM   [facility] [address], [address] [place], [place], [address] Phone: [phone]   Fax:  [phone]  Name: Amanda Cannon MRN: [id number] Date of  Birth: March 10, 1945

## 2019-08-08 ENCOUNTER — Encounter: Payer: Self-pay | Admitting: Physical Therapy

## 2019-08-08 ENCOUNTER — Other Ambulatory Visit: Payer: Self-pay

## 2019-08-08 ENCOUNTER — Ambulatory Visit: Payer: Medicare Other | Admitting: Physical Therapy

## 2019-08-08 DIAGNOSIS — R2689 Other abnormalities of gait and mobility: Secondary | ICD-10-CM | POA: Diagnosis not present

## 2019-08-08 DIAGNOSIS — R42 Dizziness and giddiness: Secondary | ICD-10-CM | POA: Diagnosis not present

## 2019-08-08 DIAGNOSIS — R2681 Unsteadiness on feet: Secondary | ICD-10-CM

## 2019-08-08 NOTE — Therapy (Signed)
[facility] [address] [place], [place], [address] Phone: [phone]   Fax:  [phone]  Physical Therapy Treatment  Patient Details  Name: Amanda Cannon MRN: [id number] Date of Birth: May 12, 1945 Referring Provider (PT): Presley Raddle, MD   Encounter Date: 08/08/2019   PT End of Session - 08/08/19 2139    Visit Number 18   vestibular 14; neck 4   Number of Visits 21    Date for PT Re-Evaluation 09/03/19    Authorization Type Medicare.  POC for neck 09/09/19    Authorization Time Period --    Progress Note Due on Visit 19    PT Start Time 1400    PT Stop Time 1445    PT Time Calculation (min) 45 min    Activity Tolerance Patient tolerated treatment well    Behavior During Therapy WFL for tasks assessed/performed           Past Medical History:  Diagnosis Date  . Alcohol use   . Anemia   . Anxiety   . Asthma   . Basal cell carcinoma    forehead  . Breast cancer ([place]) 1998   left   . Colon polyps   . GERD (gastroesophageal reflux disease)   . Glaucoma   . Hyperlipidemia   . Hypertension   . Hyponatremia    remote, mild  . Mitral valve prolapse   . Osteoarthritis    sees rheumatologist  . Osteoporosis    sees rheumatologist  . Personal history of radiation therapy 1998  . PMR (polymyalgia rheumatica) (HCC)    sees rheumatologist  . Pneumonia   . Prinzmetal angina ([place])   . White matter changes    ? ischemic sm vs per pt    Past Surgical History:  Procedure Laterality Date  . ABDOMINAL HYSTERECTOMY    . BREAST LUMPECTOMY Left 1998   cancer  . CESAREAN SECTION     x 4  . COLONOSCOPY  07/2012   in [place]  . HERNIA REPAIR  2005   abdominal hernia repair  . HYSTEROTOMY    . LYMPH NODE DISSECTION Left   . POLYPECTOMY      There were no vitals filed for this visit.   Subjective Assessment - 08/08/19 1405    Subjective Feeling less fatigued today; dizziness is doing well.  Didn't  like the rockerboard but the visual feedback was helpful.  Pt reports continued difficulty with looking down at the floor, tripping over her own feet and losing her balance.    Pertinent History bilateral symmetric sensorineural hearing loss:  bilateral occasional tinnitus; motion sickness; glaucoma; cataract lenses; HTN: osteoporosis; arthritis; trigeminal neuropathy:  polymyalgia rheumatica:  anxiety/depression    Patient Stated Goals improve cervical mobility    Currently in Pain? No/denies    Pain Onset More than a month ago                             [facility] Adult PT Treatment/Exercise - 08/08/19 1411      Therapeutic Activites    Therapeutic Activities Other Therapeutic Activities    Other Therapeutic Activities Continued to discuss current balance impairments and pt's concerns about her balance including always having to look down to see obstacles on ground, balance with turning and falls when tripping over obstacles or own feet.  Discussed ways to compensate and ways to continue to address  Balance Exercises - 08/08/19 1436      Balance Exercises: Standing   Standing, One Foot on a Step Eyes open;Head turns;Foam/compliant surface   on cone with head turns, nods; solid and soft surface   Rockerboard Anterior/posterior;EO;10 reps   feet side by side; staggered stance L/R              PT Short Term Goals - 07/31/19 1412      PT SHORT TERM GOAL #1   Title Pt will be independent with ongoing vestibular and balance HEP    Time 4    Period Weeks    Status Achieved    Target Date 08/04/19      PT SHORT TERM GOAL #2   Title Pt will improve FGA by 2 points with improved balance while performing gait with head turns and nods    Baseline 21/30 > 24/30    Time 4    Period Weeks    Status Achieved    Target Date 08/04/19      PT SHORT TERM GOAL #3   Title Pt will report mild dizziness with head turns, nods, body turns and bending down to  ground    Baseline mild dizziness; feels fullness in head after bending down to ground    Time 4    Period Weeks    Status Partially Met    Target Date 08/04/19      PT SHORT TERM GOAL #4   Title be independent in initial HEP for neck    Time 4    Period Weeks    Status New    Target Date 08/12/19      PT SHORT TERM GOAL #5   Title demonstrate > or = to 40 degrees of cervical A/ROM rotation bilaterally to improve safety with driving    Time 4    Period Weeks    Status New    Target Date 08/12/19             PT Long Term Goals - 07/23/19 2324      PT LONG TERM GOAL #1   Title Decrease DHI by 10 points to indicate improvement in dizziness for improved quality of life.    Baseline DHI: 36%    Time 4    Period Weeks    Status Revised      PT LONG TERM GOAL #2   Title Increase FGA score to >/= 24/30 points to demo improved balance during gait for incr. safety.    Baseline 21/30    Time 4    Period Weeks    Status Revised      PT LONG TERM GOAL #3   Title Independent in HEP for balance and vestibular exercises.    Baseline 4 line difference on 05-09-19 > 3 line difference > 2 line difference    Time 8    Period Weeks    Status New      PT LONG TERM GOAL #4   Title Report symptoms no greater than 1/5 on MSQ for head turns/nods, body turns, and bending down to the ground to allow pt to be more independent with household activities    Period Weeks    Status New      PT LONG TERM GOAL #5   Title be independent in advanced HEP for neck pain    Time 8    Period Weeks    Status New      PT LONG TERM GOAL #  6   Title reduce NDI to < or = to 10% to improve function with daily tasks    Time 8    Period Weeks    Status New      PT LONG TERM GOAL #7   Title improve postural strength to sit with neutral/upright posture > or = to 75% of the time    Time 8    Period Weeks    Status New      PT LONG TERM GOAL #8   Title demonstrate cervical A/ROM rotation to > or = to  50 degrees to improve safety with driving    Time 8    Period Weeks    Status New      PT LONG TERM GOAL  #9   TITLE imrpove UE strength to > or = to 4-/5 to improve endurance for functional use    Time 8    Period Weeks    Status New                 Plan - 08/08/19 2141    Clinical Impression Statement Pt continues to report tendency to look down at the ground to avoid obstacles and uneven terrain to prevent falls; she also reports imbalance with turns due to her body going where her "head goes."  Discussed role of postural control and sensory integration to maintain balance during head movement.  Treatment focused on training balance and postural control during head turns on stable and unstable surfaces and with smaller BOS.  Pt acknowleged benefit of balance training.    Personal Factors and Comorbidities Comorbidity 2;Past/Current Experience;Time since onset of injury/illness/exacerbation    Comorbidities trigeminal neuropathy, polymyalgia rheumatica, HTN, depression, arthritis, mitral valve prolapse; mild inferior abrasion in Rt external auditory canal per audiologist's report from 04-03-19 visit:  tinnitus, motion sickness, glaaucoma, sensorineural hearing loss, h/o breast cancer    Examination-Activity Limitations Locomotion Level;Transfers;Bend;Stairs;Squat;Stand    Examination-Participation Restrictions Meal Prep;Cleaning;Community Activity;Driving;Shop    Stability/Clinical Decision Making Evolving/Moderate complexity    Rehab Potential Good    PT Frequency 2x / week    PT Duration 8 weeks    PT Treatment/Interventions ADLs/Self Care Home Management;Vestibular;Gait training;Therapeutic activities;Therapeutic exercise;Balance training;Neuromuscular re-education;Patient/family education;Aquatic Therapy;Functional mobility training    PT Next Visit Plan 10th visit Progress note; dry needling if seen by ortho PT next ortho session.  Vestibular: Continue to work on postural  control/grounding - rockerboard, physioball; stability with turning/pivoting, gait with head turns, backwards walking, balance with eyes closed, bending down to ground    PT Home Exercise Plan Access Code: [email or web address] for neck    Consulted and Agree with Plan of Care Patient           Patient will benefit from skilled therapeutic intervention in order to improve the following deficits and impairments:  Difficulty walking, Decreased balance, Impaired vision/preception, Dizziness  Visit Diagnosis: Unsteadiness on feet  Other abnormalities of gait and mobility  Dizziness and giddiness     Problem List Patient Active Problem List   Diagnosis Date Noted  . Vertigo 08/06/2019  . Cough 08/06/2019  . Mixed rhinitis 08/06/2019  . Gastroesophageal reflux disease 08/06/2019  . Trigeminal neuropathy 11/13/2017  . PMR (polymyalgia rheumatica) (HCC) 04/24/2017  . Hyponatremia 07/15/2015  . Hyperlipidemia 07/15/2015  . Esophageal spasm 07/15/2015  . Essential hypertension 07/15/2015  . Depression 07/15/2015  . Asthma 07/15/2015  . Tinnitus 07/15/2015  . Osteoporosis 07/15/2015  . Spasm   . Prinzmetal angina ([place])   .  Mitral valve prolapse   . White matter changes     Rico Junker, PT, DPT 08/08/19    9:52 PM     [facility] [address] [place], [place], [address] Phone: [phone]   Fax:  [phone]  Name: Amanda Cannon MRN: [id number] Date of Birth: [birth year]

## 2019-08-13 ENCOUNTER — Encounter: Payer: Medicare Other | Admitting: Physical Therapy

## 2019-08-13 ENCOUNTER — Other Ambulatory Visit: Payer: Self-pay

## 2019-08-13 ENCOUNTER — Ambulatory Visit: Payer: Medicare Other

## 2019-08-13 DIAGNOSIS — M6281 Muscle weakness (generalized): Secondary | ICD-10-CM

## 2019-08-13 DIAGNOSIS — R252 Cramp and spasm: Secondary | ICD-10-CM | POA: Diagnosis not present

## 2019-08-13 DIAGNOSIS — R293 Abnormal posture: Secondary | ICD-10-CM

## 2019-08-13 DIAGNOSIS — M542 Cervicalgia: Secondary | ICD-10-CM

## 2019-08-13 NOTE — Therapy (Signed)
[facility] [address] [place], [place], [address] Phone: [phone]   Fax:  [phone]  Physical Therapy Treatment  Patient Details  Name: Amanda Cannon MRN: [id number] Date of Birth: 06/12/1945 Referring Provider (PT): Presley Raddle, MD   Encounter Date: 08/13/2019 Progress Note Reporting Period 07/15/19 to 08/13/19  See note below for Objective Data and Assessment of Progress/Goals.       PT End of Session - 08/13/19 0933    Visit Number 19   neck 5, vestibular 14   Date for PT Re-Evaluation 09/03/19    Authorization Type Medicare.  POC for neck 09/09/19    Progress Note Due on Visit 29    PT Start Time 0849    PT Stop Time 0931    PT Time Calculation (min) 42 min    Activity Tolerance Patient tolerated treatment well    Behavior During Therapy WFL for tasks assessed/performed           Past Medical History:  Diagnosis Date   Alcohol use    Anemia    Anxiety    Asthma    Basal cell carcinoma    forehead   Breast cancer ([place]) 1998   left    Colon polyps    GERD (gastroesophageal reflux disease)    Glaucoma    Hyperlipidemia    Hypertension    Hyponatremia    remote, mild   Mitral valve prolapse    Osteoarthritis    sees rheumatologist   Osteoporosis    sees rheumatologist   Personal history of radiation therapy 1998   PMR (polymyalgia rheumatica) ([place])    sees rheumatologist   Pneumonia    Prinzmetal angina ([place])    White matter changes    ? ischemic sm vs per pt    Past Surgical History:  Procedure Laterality Date   ABDOMINAL HYSTERECTOMY     BREAST LUMPECTOMY Left 1998   cancer   CESAREAN SECTION     x 4   COLONOSCOPY  07/2012   in [place]  2005   abdominal hernia repair   HYSTEROTOMY     LYMPH NODE DISSECTION Left    POLYPECTOMY      There were no vitals filed for this visit.   Subjective Assessment - 08/13/19 0850     Subjective My neck feels 15% better since the start of care.    Currently in Pain? Yes    Pain Score 3     Pain Location Neck    Pain Orientation Right;Left    Pain Descriptors / Indicators Aching;Tightness;Dull    Pain Type Chronic pain    Pain Onset More than a month ago    Pain Frequency Intermittent    Aggravating Factors  movement    Pain Relieving Factors resting, manual therapy              [facility] PT Assessment - 08/13/19 0001      Assessment   Medical Diagnosis neck pain    Referring Provider (PT) Presley Raddle, MD      Prior Function   Level of Independence Independent      AROM   Cervical - Right Side Bend 40    Cervical - Left Side Bend 25    Cervical - Right Rotation 45    Cervical - Left Rotation 50  [facility] Adult PT Treatment/Exercise - 08/13/19 0001      Manual Therapy   Manual Therapy Soft tissue mobilization;Myofascial release    Manual therapy comments elongation and trigger point release to bil neck and upper traps    Soft tissue mobilization sideglide to C4-6    Myofascial Release              Trigger Point Dry Needling - 08/13/19 0001    Consent Given? Yes    Education Handout Provided Previously provided    Muscles Treated Head and Neck Upper trapezius;Cervical multifidi;Suboccipitals    Upper Trapezius Response Twitch reponse elicited;Palpable increased muscle length    Suboccipitals Response Twitch response elicited;Palpable increased muscle length    Cervical multifidi Response Twitch reponse elicited;Palpable increased muscle length                  PT Short Term Goals - 08/13/19 0853      PT SHORT TERM GOAL #4   Title be independent in initial HEP for neck    Status Achieved      PT SHORT TERM GOAL #5   Title demonstrate > or = to 40 degrees of cervical A/ROM rotation bilaterally to improve safety with driving    Status Achieved             PT Long Term Goals - 08/13/19 0930       PT LONG TERM GOAL #5   Title be independent in advanced HEP for neck pain    Time 8    Period Weeks    Status On-going      PT LONG TERM GOAL #6   Title reduce NDI to < or = to 10% to improve function with daily tasks    Time 8    Period Weeks    Status On-going      PT LONG TERM GOAL #7   Title improve postural strength to sit with neutral/upright posture > or = to 75% of the time    Status Achieved      PT LONG TERM GOAL #8   Title demonstrate cervical A/ROM rotation to > or = to 50 degrees to improve safety with driving    Time 8    Period Weeks    Status On-going                 Plan - 08/13/19 0109    Clinical Impression Statement Pt reports 10-15% improvement in cervical symptoms overall. Pt with improved cervical A/ROM into rotation and sidebending. Pt with Lt>Rt cervical tension with manual therapy.  Pt with improved tissue mobility after manual therapy and dry needling today.  Pt has been working on posture and PT educated pt to do suboccipital release and use heat at home for improved tissue mobility. Tissue length at rest improved throughout the session with good AROM demonstrated at the end. Next session will focus on cervical and thoracic strength to improve postural endurance.    Comorbidities trigeminal neuropathy, polymyalgia rheumatica, HTN, depression, arthritis, mitral valve prolapse; mild inferior abrasion in Rt external auditory canal per audiologist's report from 04-03-19 visit:  tinnitus, motion sickness, glaaucoma, sensorineural hearing loss, h/o breast cancer    Stability/Clinical Decision Making Evolving/Moderate complexity    PT Frequency 2x / week    PT Duration 8 weeks    PT Treatment/Interventions ADLs/Self Care Home Management;Vestibular;Gait training;Therapeutic activities;Therapeutic exercise;Balance training;Neuromuscular re-education;Patient/family education;Aquatic Therapy;Functional mobility training    PT Next Visit Plan postural  strength with  band, cervical retraction, manual therapy    PT Home Exercise Plan Access Code: [email or web address] for neck    Consulted and Agree with Plan of Care Patient           Patient will benefit from skilled therapeutic intervention in order to improve the following deficits and impairments:  Difficulty walking, Decreased balance, Impaired vision/preception, Dizziness  Visit Diagnosis: Cervicalgia  Cramp and spasm  Abnormal posture  Muscle weakness (generalized)     Problem List Patient Active Problem List   Diagnosis Date Noted   Vertigo 08/06/2019   Cough 08/06/2019   Mixed rhinitis 08/06/2019   Gastroesophageal reflux disease 08/06/2019   Trigeminal neuropathy 11/13/2017   PMR (polymyalgia rheumatica) (HCC) 04/24/2017   Hyponatremia 07/15/2015   Hyperlipidemia 07/15/2015   Esophageal spasm 07/15/2015   Essential hypertension 07/15/2015   Depression 07/15/2015   Asthma 07/15/2015   Tinnitus 07/15/2015   Osteoporosis 07/15/2015   Spasm    Prinzmetal angina (HCC)    Mitral valve prolapse    White matter changes     Sigurd Sos, PT 08/13/19 9:35 AM  [facility] [address] [place], [place], [address] Phone: [phone]   Fax:  [phone]  Name: Amanda Cannon MRN: [id number] Date of Birth: 25-Sep-1945

## 2019-08-15 ENCOUNTER — Encounter: Payer: Medicare Other | Admitting: Physical Therapy

## 2019-08-16 ENCOUNTER — Ambulatory Visit: Payer: Medicare Other | Admitting: Physical Therapy

## 2019-08-16 ENCOUNTER — Encounter: Payer: Self-pay | Admitting: Physical Therapy

## 2019-08-16 ENCOUNTER — Other Ambulatory Visit: Payer: Self-pay

## 2019-08-16 DIAGNOSIS — R293 Abnormal posture: Secondary | ICD-10-CM

## 2019-08-16 DIAGNOSIS — R252 Cramp and spasm: Secondary | ICD-10-CM | POA: Diagnosis not present

## 2019-08-16 DIAGNOSIS — M542 Cervicalgia: Secondary | ICD-10-CM

## 2019-08-16 DIAGNOSIS — M6281 Muscle weakness (generalized): Secondary | ICD-10-CM | POA: Diagnosis not present

## 2019-08-16 NOTE — Therapy (Signed)
[facility] [address] [place], [place], [address] Phone: [phone]   Fax:  [phone]  Physical Therapy Treatment  Patient Details  Name: Amanda Cannon MRN: [id number] Date of Birth: [birth year] Referring Provider (PT): Presley Raddle, MD   Encounter Date: 08/16/2019   PT End of Session - 08/16/19 0846    Visit Number 20   neck 6, vestibular 14   Date for PT Re-Evaluation 09/03/19    Authorization Type Medicare.  POC for neck 09/09/19    Authorization Time Period 05-09-19 - 08-06-19    Progress Note Due on Visit 29    PT Start Time 0846    PT Stop Time 0938    PT Time Calculation (min) 52 min    Activity Tolerance Patient tolerated treatment well    Behavior During Therapy [facility] for tasks assessed/performed           Past Medical History:  Diagnosis Date   Alcohol use    Anemia    Anxiety    Asthma    Basal cell carcinoma    forehead   Breast cancer ([place]) 1998   left    Colon polyps    GERD (gastroesophageal reflux disease)    Glaucoma    Hyperlipidemia    Hypertension    Hyponatremia    remote, mild   Mitral valve prolapse    Osteoarthritis    sees rheumatologist   Osteoporosis    sees rheumatologist   Personal history of radiation therapy 1998   PMR (polymyalgia rheumatica) ([place])    sees rheumatologist   Pneumonia    Prinzmetal angina ([place])    White matter changes    ? ischemic sm vs per pt    Past Surgical History:  Procedure Laterality Date   ABDOMINAL HYSTERECTOMY     BREAST LUMPECTOMY Left 1998   cancer   CESAREAN SECTION     x 4   COLONOSCOPY  07/2012   in [place]  2005   abdominal hernia repair   HYSTEROTOMY     LYMPH NODE DISSECTION Left    POLYPECTOMY      There were no vitals filed for this visit.   Subjective Assessment - 08/16/19 0855    Subjective Feeliing better regarding my neck. No significant pain this morning     Pertinent History bilateral symmetric sensorineural hearing loss:  bilateral occasional tinnitus; motion sickness; glaucoma; cataract lenses; HTN: osteoporosis; arthritis; trigeminal neuropathy:  polymyalgia rheumatica:  anxiety/depression    Currently in Pain? Yes    Pain Score 1     Pain Location Neck    Pain Orientation Left    Pain Descriptors / Indicators Dull    Multiple Pain Sites No                             [facility] Adult PT Treatment/Exercise - 08/16/19 0001      Neck Exercises: Supine   Other Supine Exercise Supine stabs with hea head on green ball 10x each and issued for HEP   Sholder flexion overhead flexion stetch 10x     Manual Therapy   Manual Therapy Soft tissue mobilization;Myofascial release   Lt > RT: SCM and occiput in addition to UT and scalenes   Manual therapy comments elongation and trigger point release to bil neck and upper traps  PT Education - 08/16/19 0940    Education Details Supine cervical stabs for HEP    Person(s) Educated Patient    Methods Explanation;Demonstration;Verbal cues;Handout    Comprehension Returned demonstration;Verbalized understanding            PT Short Term Goals - 08/13/19 0853      PT SHORT TERM GOAL #4   Title be independent in initial HEP for neck    Status Achieved      PT SHORT TERM GOAL #5   Title demonstrate > or = to 40 degrees of cervical A/ROM rotation bilaterally to improve safety with driving    Status Achieved             PT Long Term Goals - 08/13/19 0930      PT LONG TERM GOAL #5   Title be independent in advanced HEP for neck pain    Time 8    Period Weeks    Status On-going      PT LONG TERM GOAL #6   Title reduce NDI to < or = to 10% to improve function with daily tasks    Time 8    Period Weeks    Status On-going      PT LONG TERM GOAL #7   Title improve postural strength to sit with neutral/upright posture > or = to 75% of the time    Status  Achieved      PT LONG TERM GOAL #8   Title demonstrate cervical A/ROM rotation to > or = to 50 degrees to improve safety with driving    Time 8    Period Weeks    Status On-going                 Plan - 08/16/19 0901    Clinical Impression Statement Pt reports high satisfaction with a reduction in her neck symptoms. 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 Today HEp was progressed to include supine cervical stabs with her head on the physioball. pt plans to try her massage table or buy a ball for home use. Although much improved LT cervical muscles remaint ighter than RT side, not as many active trigger points today.    Personal Factors and Comorbidities Comorbidity 2;Past/Current Experience;Time since onset of injury/illness/exacerbation    Comorbidities trigeminal neuropathy, polymyalgia rheumatica, HTN, depression, arthritis, mitral valve prolapse; mild inferior abrasion in Rt external auditory canal per audiologist's report from 04-03-19 visit:  tinnitus, motion sickness, glaaucoma, sensorineural hearing loss, h/o breast cancer    Examination-Activity Limitations Locomotion Level;Transfers;Bend;Stairs;Squat;Stand    Examination-Participation Restrictions Meal Prep;Cleaning;Community Activity;Driving;Shop    Stability/Clinical Decision Making Evolving/Moderate complexity    Rehab Potential Good    PT Frequency 2x / week    PT Duration 8 weeks    PT Treatment/Interventions ADLs/Self Care Home Management;Vestibular;Gait training;Therapeutic activities;Therapeutic exercise;Balance training;Neuromuscular re-education;Patient/family education;Aquatic Therapy;Functional mobility training    PT Next Visit Plan postural strength with band, cervical retraction, manual therapy    PT Home Exercise Plan Access Code: [email or web address]    Consulted and Agree with Plan of Care Patient           Patient will benefit from skilled therapeutic intervention in order  to improve the following deficits and impairments:  Difficulty walking, Decreased balance, Impaired vision/preception, Dizziness  Visit Diagnosis: Cervicalgia  Cramp and spasm  Abnormal posture  Muscle weakness (generalized)     Problem List Patient Active Problem List   Diagnosis  Date Noted   Vertigo 08/06/2019   Cough 08/06/2019   Mixed rhinitis 08/06/2019   Gastroesophageal reflux disease 08/06/2019   Trigeminal neuropathy 11/13/2017   PMR (polymyalgia rheumatica) (HCC) 04/24/2017   Hyponatremia 07/15/2015   Hyperlipidemia 07/15/2015   Esophageal spasm 07/15/2015   Essential hypertension 07/15/2015   Depression 07/15/2015   Asthma 07/15/2015   Tinnitus 07/15/2015   Osteoporosis 07/15/2015   Spasm    Prinzmetal angina (HCC)    Mitral valve prolapse    White matter changes     Danniel Grenz, PTA 08/16/2019, 9:46 AM  [facility] [address], [place], [place], [address] Phone: [phone]   Fax:  [phone]  Name: Amanda Cannon MRN: [id number] Date of Birth: 07-04-1945 Access Code: [email or web address]: 07/23/2021Prepared by: Anderson Malta CochranExercises  Supine Isometric Neck Extension - 1 x daily - 7 x weekly - 1 sets - 10 reps - 5 hold

## 2019-08-19 ENCOUNTER — Encounter: Payer: Medicare Other | Admitting: Physical Therapy

## 2019-08-19 ENCOUNTER — Other Ambulatory Visit: Payer: Self-pay

## 2019-08-19 ENCOUNTER — Ambulatory Visit: Payer: Medicare Other

## 2019-08-19 DIAGNOSIS — R293 Abnormal posture: Secondary | ICD-10-CM | POA: Diagnosis not present

## 2019-08-19 DIAGNOSIS — M542 Cervicalgia: Secondary | ICD-10-CM | POA: Diagnosis not present

## 2019-08-19 DIAGNOSIS — M6281 Muscle weakness (generalized): Secondary | ICD-10-CM

## 2019-08-19 DIAGNOSIS — R252 Cramp and spasm: Secondary | ICD-10-CM

## 2019-08-19 NOTE — Therapy (Signed)
[facility] [address], [place], [place], [address] Phone: [phone]   Fax:  [phone]  Physical Therapy Treatment  Patient Details  Name: Amanda Cannon MRN: [id number] Date of Birth: Nov 22, 1945 Referring Provider (PT): Presley Raddle, MD   Encounter Date: 08/19/2019   PT End of Session - 08/19/19 0935    Visit Number 21   7 nexk, 14 vestibular   Date for PT Re-Evaluation 09/03/19    Authorization Type Medicare.  POC for neck 09/09/19    Progress Note Due on Visit 29    PT Start Time 0847    PT Stop Time 0928    PT Time Calculation (min) 41 min    Activity Tolerance Patient tolerated treatment well    Behavior During Therapy [facility] for tasks assessed/performed           Past Medical History:  Diagnosis Date  . Alcohol use   . Anemia   . Anxiety   . Asthma   . Basal cell carcinoma    forehead  . Breast cancer ([place]) 1998   left   . Colon polyps   . GERD (gastroesophageal reflux disease)   . Glaucoma   . Hyperlipidemia   . Hypertension   . Hyponatremia    remote, mild  . Mitral valve prolapse   . Osteoarthritis    sees rheumatologist  . Osteoporosis    sees rheumatologist  . Personal history of radiation therapy 1998  . PMR (polymyalgia rheumatica) (HCC)    sees rheumatologist  . Pneumonia   . Prinzmetal angina ([place])   . White matter changes    ? ischemic sm vs per pt    Past Surgical History:  Procedure Laterality Date  . ABDOMINAL HYSTERECTOMY    . BREAST LUMPECTOMY Left 1998   cancer  . CESAREAN SECTION     x 4  . COLONOSCOPY  07/2012   in [place]  . HERNIA REPAIR  2005   abdominal hernia repair  . HYSTEROTOMY    . LYMPH NODE DISSECTION Left   . POLYPECTOMY      There were no vitals filed for this visit.   Subjective Assessment - 08/19/19 0850    Subjective My neck is overall better.    Currently in Pain? Yes    Pain Score 3     Pain Location Neck    Pain Orientation  Left    Pain Descriptors / Indicators Dull    Pain Type Chronic pain    Pain Onset More than a month ago    Pain Frequency Intermittent    Aggravating Factors  movement, sitting upright, working    Pain Relieving Factors rest, manual therapy              [facility] PT Assessment - 08/19/19 0001      AROM   Cervical - Right Rotation 45    Cervical - Left Rotation 50                         [facility] Adult PT Treatment/Exercise - 08/19/19 0001      Neck Exercises: Supine   Other Supine Exercise Supine stabs with hea head on green ball 10x each and issued for HEP   Sholder flexion overhead flexion stetch 10x     Manual Therapy   Manual Therapy Soft tissue mobilization;Myofascial release;Joint mobilization   Lt > RT: SCM and occiput in addition to UT and scalenes  Manual therapy comments elongation and trigger point release to bil neck and upper traps    Joint Mobilization PA mobs, sideglide C3-5            Trigger Point Dry Needling - 08/19/19 0001    Consent Given? Yes    Education Handout Provided Previously provided    Muscles Treated Head and Neck Upper trapezius;Cervical multifidi;Suboccipitals    Upper Trapezius Response Twitch reponse elicited;Palpable increased muscle length    Suboccipitals Response Twitch response elicited;Palpable increased muscle length    Cervical multifidi Response Twitch reponse elicited;Palpable increased muscle length                  PT Short Term Goals - 08/13/19 0853      PT SHORT TERM GOAL #4   Title be independent in initial HEP for neck    Status Achieved      PT SHORT TERM GOAL #5   Title demonstrate > or = to 40 degrees of cervical A/ROM rotation bilaterally to improve safety with driving    Status Achieved             PT Long Term Goals - 08/19/19 0853      PT LONG TERM GOAL #7   Title improve postural strength to sit with neutral/upright posture > or = to 75% of the time                  Plan - 08/19/19 0934    Clinical Impression Statement Pt is performing cervical retraction exercise and is doing both seated and supine.  Pt with continued chronic tension in the Lt>Rt neck and upper traps and demonstrated improved tension and reduced trigger points after dry needling and manual therapy today.  Pt with reduced segmental mobility in the cervical spine with PA and sideglides and demonstrated improved cervical A/ROM and reduced pain after mobilization today.  Pt will continue to benefit from skilled PT to address cervical mobility, postural strength and ROM progression.    PT Treatment/Interventions ADLs/Self Care Home Management;Vestibular;Gait training;Therapeutic activities;Therapeutic exercise;Balance training;Neuromuscular re-education;Patient/family education;Aquatic Therapy;Functional mobility training    PT Next Visit Plan postural strength with band, cervical retraction, manual therapy.  Add self snags or cervical mobilization with form roll for HEP    PT Home Exercise Plan Access Code: [email or web address]    Consulted and Agree with Plan of Care Patient           Patient will benefit from skilled therapeutic intervention in order to improve the following deficits and impairments:     Visit Diagnosis: Cervicalgia  Cramp and spasm  Abnormal posture  Muscle weakness (generalized)     Problem List Patient Active Problem List   Diagnosis Date Noted  . Vertigo 08/06/2019  . Cough 08/06/2019  . Mixed rhinitis 08/06/2019  . Gastroesophageal reflux disease 08/06/2019  . Trigeminal neuropathy 11/13/2017  . PMR (polymyalgia rheumatica) (HCC) 04/24/2017  . Hyponatremia 07/15/2015  . Hyperlipidemia 07/15/2015  . Esophageal spasm 07/15/2015  . Essential hypertension 07/15/2015  . Depression 07/15/2015  . Asthma 07/15/2015  . Tinnitus 07/15/2015  . Osteoporosis 07/15/2015  . Spasm   . Prinzmetal angina ([place])   . Mitral valve prolapse   . White matter changes     Sigurd Sos, PT 08/19/19 9:36 AM  [facility] [address] [place], [place], [address] Phone: [phone]   Fax:  [phone]  Name: Amanda Cannon MRN: [id number] Date of Birth: [birth year]

## 2019-08-21 ENCOUNTER — Ambulatory Visit: Payer: Medicare Other | Admitting: Physical Therapy

## 2019-08-21 ENCOUNTER — Encounter: Payer: Self-pay | Admitting: Physical Therapy

## 2019-08-21 ENCOUNTER — Other Ambulatory Visit: Payer: Self-pay

## 2019-08-21 DIAGNOSIS — R2681 Unsteadiness on feet: Secondary | ICD-10-CM

## 2019-08-21 DIAGNOSIS — R42 Dizziness and giddiness: Secondary | ICD-10-CM

## 2019-08-21 DIAGNOSIS — R2689 Other abnormalities of gait and mobility: Secondary | ICD-10-CM

## 2019-08-21 NOTE — Therapy (Addendum)
[facility] [address] [place], [place], [address] Phone: [phone]   Fax:  [phone]  Physical Therapy Treatment  Patient Details  Name: Amanda Cannon MRN: [id number] Date of Birth: January 04, 1946 Referring Provider (PT): Presley Raddle, MD   Encounter Date: 08/21/2019   PT End of Session - 08/21/19 1601    Visit Number 22   15vestibular (7 neck)   Number of Visits 21   vestibular   Date for PT Re-Evaluation 09/03/19    Authorization Type Medicare.  POC for neck 09/09/19    Progress Note Due on Visit 29    PT Start Time 1445    PT Stop Time 1530    PT Time Calculation (min) 45 min    Activity Tolerance Patient tolerated treatment well    Behavior During Therapy [facility] for tasks assessed/performed           Past Medical History:  Diagnosis Date  . Alcohol use   . Anemia   . Anxiety   . Asthma   . Basal cell carcinoma    forehead  . Breast cancer ([place]) 1998   left   . Colon polyps   . GERD (gastroesophageal reflux disease)   . Glaucoma   . Hyperlipidemia   . Hypertension   . Hyponatremia    remote, mild  . Mitral valve prolapse   . Osteoarthritis    sees rheumatologist  . Osteoporosis    sees rheumatologist  . Personal history of radiation therapy 1998  . PMR (polymyalgia rheumatica) (HCC)    sees rheumatologist  . Pneumonia   . Prinzmetal angina ([place])   . White matter changes    ? ischemic sm vs per pt    Past Surgical History:  Procedure Laterality Date  . ABDOMINAL HYSTERECTOMY    . BREAST LUMPECTOMY Left 1998   cancer  . CESAREAN SECTION     x 4  . [place]  07/2012   in [place]  . HERNIA REPAIR  2005   abdominal hernia repair  . HYSTEROTOMY    . LYMPH NODE DISSECTION Left   . POLYPECTOMY      There were no vitals filed for this visit.   Subjective Assessment - 08/21/19 1450    Subjective Reports neck is not as good this week due to weather.  Dizziness is still there.   Certain days are better than others; still getting dizzy when bending down to the ground.    Pertinent History bilateral symmetric sensorineural hearing loss:  bilateral occasional tinnitus; motion sickness; glaucoma; cataract lenses; HTN: osteoporosis; arthritis; trigeminal neuropathy:  polymyalgia rheumatica:  anxiety/depression    Currently in Pain? Yes                                  Balance Exercises - 08/21/19 1503      Balance Exercises: Standing   Rockerboard Anterior/posterior;Lateral;Head turns;EO;10 reps;5 reps    Retro Gait 5 reps;Other (comment)    Other Standing Exercises Rockerboard weight shifting laterally and then ant/posterior with use of mirror for visual feedback.  Progressed to combining head turn to L with weight shift L, head turn to R with weight shift to R, looking up with weight shift forwards and looking down with weight shift back x 5 reps each direction.  Also focused on maintaining balance and postural control in midline while performing head turns and nods x 10 reps with  supervision-min A with improved postural control and decreased shoulder and trunk movement with head movement.  Added dynamic UE movements with head movements and visual scanning - pt performed moving ball in diagonal patterns x 5 reps each direction with board ant/post and laterally with min A for balance.    Other Standing Exercises Comments Performed forwards and retro gait by counter combined with reaching down to floor for cone, walking backwards and turning to reach across midline and place cone on counter x 5 reps; performed the reverse: reaching across midline for cone on counter, walking backwards and then bending down to place cone back on floor x 5 reps with supervision.  Verbal cues needed to cue pt to turn perform head movements to find target with head and eyes and for head-trunk dissociation to decrease en bloc movement.               PT Short Term Goals -  08/13/19 0853      PT SHORT TERM GOAL #4   Title be independent in initial HEP for neck    Status Achieved      PT SHORT TERM GOAL #5   Title demonstrate > or = to 40 degrees of cervical A/ROM rotation bilaterally to improve safety with driving    Status Achieved             PT Long Term Goals - 08/21/19 1605      PT LONG TERM GOAL #1   Title Decrease DHI by 10 points to indicate improvement in dizziness for improved quality of life.    Baseline DHI: 36%    Status Revised    Target Date 09/03/19      PT LONG TERM GOAL #2   Title Increase FGA score to >/= 24/30 points to demo improved balance during gait for incr. safety.    Baseline 21/30    Status Revised    Target Date 09/03/19      PT LONG TERM GOAL #3   Title Independent in HEP for balance and vestibular exercises.    Status Revised    Target Date 09/03/19      PT LONG TERM GOAL #4   Title Report symptoms no greater than 1/5 on MSQ for head turns/nods, body turns, and bending down to the ground to allow pt to be more independent with household activities    Status New    Target Date 09/03/19                 Plan - 08/21/19 1603    Clinical Impression Statement Pt demonstrates improvements in appropriate use of balance reactions for postural control during head movements and dynamic UE movements.  Pt continues to attempt to use en bloc movement to minimize head movement; discussed purpose of habituation exercises.  Following habituation pt reported decreased intensity of dizziness.  Will continue to address and progress towards LTG.    Personal Factors and Comorbidities Comorbidity 2;Past/Current Experience;Time since onset of injury/illness/exacerbation    Comorbidities trigeminal neuropathy, polymyalgia rheumatica, HTN, depression, arthritis, mitral valve prolapse; mild inferior abrasion in Rt external auditory canal per audiologist's report from 04-03-19 visit:  tinnitus, motion sickness, glaaucoma,  sensorineural hearing loss, h/o breast cancer    Examination-Activity Limitations Locomotion Level;Transfers;Bend;Stairs;Squat;Stand    Examination-Participation Restrictions Meal Prep;Cleaning;Community Activity;Driving;Shop    Stability/Clinical Decision Making Evolving/Moderate complexity    Rehab Potential Good    PT Frequency 2x / week    PT Duration 8 weeks  PT Treatment/Interventions ADLs/Self Care Home Management;Vestibular;Gait training;Therapeutic activities;Therapeutic exercise;Balance training;Neuromuscular re-education;Patient/family education;Aquatic Therapy;Functional mobility training    PT Next Visit Plan Vestibular: rockerboard with EC; physioball; stability with turning/pivoting, gait with head turns, backwards walking, balance with eyes closed, bending down to ground    PT Home Exercise Plan Access Code: [email or web address] for neck    Consulted and Agree with Plan of Care Patient           Patient will benefit from skilled therapeutic intervention in order to improve the following deficits and impairments:  Difficulty walking, Decreased balance, Impaired vision/preception, Dizziness  Visit Diagnosis: Unsteadiness on feet  Other abnormalities of gait and mobility  Dizziness and giddiness     Problem List Patient Active Problem List   Diagnosis Date Noted  . Vertigo 08/06/2019  . Cough 08/06/2019  . Mixed rhinitis 08/06/2019  . Gastroesophageal reflux disease 08/06/2019  . Trigeminal neuropathy 11/13/2017  . PMR (polymyalgia rheumatica) (HCC) 04/24/2017  . Hyponatremia 07/15/2015  . Hyperlipidemia 07/15/2015  . Esophageal spasm 07/15/2015  . Essential hypertension 07/15/2015  . Depression 07/15/2015  . Asthma 07/15/2015  . Tinnitus 07/15/2015  . Osteoporosis 07/15/2015  . Spasm   . Prinzmetal angina ([place])   . Mitral valve prolapse   . White matter changes     Rico Junker, PT, DPT 08/21/19    4:07 PM    [facility] [address] [place], [place], [address] Phone: [phone]   Fax:  [phone]  Name: Amanda Cannon MRN: [id number] Date of Birth: 1945/06/19

## 2019-08-26 ENCOUNTER — Encounter: Payer: Self-pay | Admitting: Physical Therapy

## 2019-08-26 ENCOUNTER — Ambulatory Visit: Payer: Medicare Other | Attending: Family Medicine | Admitting: Physical Therapy

## 2019-08-26 ENCOUNTER — Other Ambulatory Visit: Payer: Self-pay

## 2019-08-26 DIAGNOSIS — M542 Cervicalgia: Secondary | ICD-10-CM | POA: Insufficient documentation

## 2019-08-26 DIAGNOSIS — R2689 Other abnormalities of gait and mobility: Secondary | ICD-10-CM | POA: Diagnosis not present

## 2019-08-26 DIAGNOSIS — R2681 Unsteadiness on feet: Secondary | ICD-10-CM | POA: Diagnosis not present

## 2019-08-26 DIAGNOSIS — R293 Abnormal posture: Secondary | ICD-10-CM | POA: Diagnosis not present

## 2019-08-26 DIAGNOSIS — M6281 Muscle weakness (generalized): Secondary | ICD-10-CM | POA: Diagnosis not present

## 2019-08-26 DIAGNOSIS — R252 Cramp and spasm: Secondary | ICD-10-CM | POA: Insufficient documentation

## 2019-08-26 NOTE — Therapy (Addendum)
[facility] [address], [place], [place], [address] Phone: [phone]   Fax:  [phone]  Physical Therapy Treatment  Patient Details  Name: Amanda Cannon MRN: [id number] Date of Birth: 1945-02-08 Referring Provider (PT): Presley Raddle, MD   Encounter Date: 08/26/2019   PT End of Session - 08/26/19 0850    Visit Number 23  8 neck, 15 vestibular   Number of Visits 21    Date for PT Re-Evaluation 09/03/19    Authorization Type Medicare.  POC for neck 09/09/19    Authorization Time Period 05-09-19 - 08-06-19    Progress Note Due on Visit 29    PT Start Time 0847    PT Stop Time 0939    PT Time Calculation (min) 52 min    Activity Tolerance Patient tolerated treatment well    Behavior During Therapy [facility] for tasks assessed/performed           Past Medical History:  Diagnosis Date  . Alcohol use   . Anemia   . Anxiety   . Asthma   . Basal cell carcinoma    forehead  . Breast cancer ([place]) 1998   left   . Colon polyps   . GERD (gastroesophageal reflux disease)   . Glaucoma   . Hyperlipidemia   . Hypertension   . Hyponatremia    remote, mild  . Mitral valve prolapse   . Osteoarthritis    sees rheumatologist  . Osteoporosis    sees rheumatologist  . Personal history of radiation therapy 1998  . PMR (polymyalgia rheumatica) (HCC)    sees rheumatologist  . Pneumonia   . Prinzmetal angina ([place])   . White matter changes    ? ischemic sm vs per pt    Past Surgical History:  Procedure Laterality Date  . ABDOMINAL HYSTERECTOMY    . BREAST LUMPECTOMY Left 1998   cancer  . CESAREAN SECTION     x 4  . [place]  07/2012   [place]  . HERNIA REPAIR  2005   abdominal hernia repair  . HYSTEROTOMY    . LYMPH NODE DISSECTION Left   . POLYPECTOMY      There were no vitals filed for this visit.   Subjective Assessment - 08/26/19 0851    Subjective My ROM looking Lt is better but I'm not improving  so to my RT.    Pertinent History bilateral symmetric sensorineural hearing loss:  bilateral occasional tinnitus; motion sickness; glaucoma; cataract lenses; HTN: osteoporosis; arthritis; trigeminal neuropathy:  polymyalgia rheumatica:  anxiety/depression    Currently in Pain? Yes    Pain Score 2     Pain Location Neck    Pain Orientation Right;Left    Pain Descriptors / Indicators Dull;Tightness    Aggravating Factors  moving, turning neck    Pain Relieving Factors manual therapy    Multiple Pain Sites No                             [facility] Adult PT Treatment/Exercise - 08/26/19 0001      Neck Exercises: Machines for Strengthening   UBE (Upper Arm Bike) L1 1:30 each direction .Needs cushion for her feet.       Neck Exercises: Seated   Other Seated Exercise Seated SNAGS with towel 10x Bil rotation, VC to not move her shoulders      Manual Therapy   Manual Therapy Soft tissue  mobilization;Myofascial release   Lt > RT: SCM and occiput in addition to UT and scalenes   Manual therapy comments elongation and trigger point release to bil neck and upper traps    Soft tissue mobilization Static stretching for Bil rotation, > RT                  PT Education - 08/26/19 0928    Education Details Self SNAGs for HEP    Person(s) Educated Patient    Methods Explanation;Demonstration;Verbal cues;Handout    Comprehension Returned demonstration;Verbalized understanding            PT Short Term Goals - 08/13/19 0853      PT SHORT TERM GOAL #4   Title be independent in initial HEP for neck    Status Achieved      PT SHORT TERM GOAL #5   Title demonstrate > or = to 40 degrees of cervical A/ROM rotation bilaterally to improve safety with driving    Status Achieved             PT Long Term Goals - 08/21/19 1605      PT LONG TERM GOAL #1   Title Decrease DHI by 10 points to indicate improvement in dizziness for improved quality of life.    Baseline DHI: 36%     Status Revised    Target Date 09/03/19      PT LONG TERM GOAL #2   Title Increase FGA score to >/= 24/30 points to demo improved balance during gait for incr. safety.    Baseline 21/30    Status Revised    Target Date 09/03/19      PT LONG TERM GOAL #3   Title Independent in HEP for balance and vestibular exercises.    Status Revised    Target Date 09/03/19      PT LONG TERM GOAL #4   Title Report symptoms no greater than 1/5 on MSQ for head turns/nods, body turns, and bending down to the ground to allow pt to be more independent with household activities    Status New    Target Date 09/03/19                 Plan - 08/26/19 0929    Clinical Impression Statement Pt arrives with not much pain but stiffness when turning RT. PTA instructed pt in SNAGS for HEP addition/progression. Pt continues to respond well to prolonged stretching and soft tissue work, RT rotation was much improved at end of session.    Personal Factors and Comorbidities Comorbidity 2;Past/Current Experience;Time since onset of injury/illness/exacerbation    Comorbidities trigeminal neuropathy, polymyalgia rheumatica, HTN, depression, arthritis, mitral valve prolapse; mild inferior abrasion in Rt external auditory canal per audiologist's report from 04-03-19 visit:  tinnitus, motion sickness, glaaucoma, sensorineural hearing loss, h/o breast cancer    Examination-Activity Limitations Locomotion Level;Transfers;Bend;Stairs;Squat;Stand    Examination-Participation Restrictions Meal Prep;Cleaning;Community Activity;Driving;Shop    Stability/Clinical Decision Making Evolving/Moderate complexity    Rehab Potential Good    PT Frequency 2x / week    PT Duration 8 weeks    PT Treatment/Interventions ADLs/Self Care Home Management;Vestibular;Gait training;Therapeutic activities;Therapeutic exercise;Balance training;Neuromuscular re-education;Patient/family education;Aquatic Therapy;Functional mobility training    PT  Next Visit Plan Review SNAGS, take ROM measurements, manual techniques    PT Home Exercise Plan Access Code: [email or web address] for neck    Consulted and Agree with Plan of Care Patient           Patient will benefit  from skilled therapeutic intervention in order to improve the following deficits and impairments:  Difficulty walking, Decreased balance, Impaired vision/preception, Dizziness  Visit Diagnosis: Cervicalgia  Cramp and spasm  Abnormal posture  Muscle weakness (generalized)     Problem List Patient Active Problem List   Diagnosis Date Noted  . Vertigo 08/06/2019  . Cough 08/06/2019  . Mixed rhinitis 08/06/2019  . Gastroesophageal reflux disease 08/06/2019  . Trigeminal neuropathy 11/13/2017  . PMR (polymyalgia rheumatica) (HCC) 04/24/2017  . Hyponatremia 07/15/2015  . Hyperlipidemia 07/15/2015  . Esophageal spasm 07/15/2015  . Essential hypertension 07/15/2015  . Depression 07/15/2015  . Asthma 07/15/2015  . Tinnitus 07/15/2015  . Osteoporosis 07/15/2015  . Spasm   . Prinzmetal angina ([place])   . Mitral valve prolapse   . White matter changes     Christohper Dube, PTA 08/26/2019, 9:44 AM   [facility] [address] [place], [place], [address] Phone: [phone]   Fax:  [phone]  Name: Amanda Cannon MRN: [id number] Date of Birth: Jan 13, 1946 Access Code: [email or web address]: [email or web address]: 08/02/2021Prepared by: Anderson Malta CochranExercises  Seated Cervical Flexion AROM - 3 x daily - 7 x weekly - 3 reps - 1 sets - 20 hold  Seated Cervical Sidebending AROM - 3 x daily - 7 x weekly - 3 reps - 1 sets - 20 hold  Seated Cervical Rotation AROM - 3 x daily - 7 x weekly - 3 reps - 1 sets - 20 hold  Seated Correct Posture - 1 x daily - 7 x weekly - 10 reps - 3 sets  Supine Shoulder Flexion AAROM with Hands Clasped - 3 x daily - 7 x weekly - 1 sets - 10 reps - 10 hold  Seated Assisted  Cervical Rotation with Towel - 2 x daily - 7 x weekly - 2 sets - 10 reps

## 2019-08-27 ENCOUNTER — Ambulatory Visit: Payer: Medicare Other | Admitting: Physical Therapy

## 2019-08-27 DIAGNOSIS — H524 Presbyopia: Secondary | ICD-10-CM | POA: Diagnosis not present

## 2019-08-27 DIAGNOSIS — H40053 Ocular hypertension, bilateral: Secondary | ICD-10-CM | POA: Diagnosis not present

## 2019-08-28 ENCOUNTER — Ambulatory Visit: Payer: Medicare Other | Admitting: Physical Therapy

## 2019-08-28 ENCOUNTER — Other Ambulatory Visit: Payer: Self-pay

## 2019-08-28 ENCOUNTER — Encounter: Payer: Self-pay | Admitting: Physical Therapy

## 2019-08-28 DIAGNOSIS — R252 Cramp and spasm: Secondary | ICD-10-CM

## 2019-08-28 DIAGNOSIS — R2681 Unsteadiness on feet: Secondary | ICD-10-CM | POA: Diagnosis not present

## 2019-08-28 DIAGNOSIS — R2689 Other abnormalities of gait and mobility: Secondary | ICD-10-CM | POA: Diagnosis not present

## 2019-08-28 DIAGNOSIS — R293 Abnormal posture: Secondary | ICD-10-CM

## 2019-08-28 DIAGNOSIS — M6281 Muscle weakness (generalized): Secondary | ICD-10-CM | POA: Diagnosis not present

## 2019-08-28 DIAGNOSIS — M542 Cervicalgia: Secondary | ICD-10-CM | POA: Diagnosis not present

## 2019-08-28 NOTE — Therapy (Addendum)
[facility] [address], [place], [place], [address] Phone: [phone]   Fax:  [phone]  Physical Therapy Treatment  Patient Details  Name: Amanda Cannon MRN: [id number] Date of Birth: December 18, 1945 Referring Provider (PT): Presley Raddle, MD   Encounter Date: 08/28/2019   PT End of Session - 08/28/19 0927    Visit Number 24   9 neck, 15 vestibular   Number of Visits 21    Date for PT Re-Evaluation 09/03/19    Authorization Type Medicare.  POC for neck 09/09/19    Authorization Time Period 05-09-19 - 08-06-19    Progress Note Due on Visit 29    PT Start Time 0846    PT Stop Time 0928    PT Time Calculation (min) 42 min    Activity Tolerance Patient tolerated treatment well    Behavior During Therapy [facility] for tasks assessed/performed           Past Medical History:  Diagnosis Date  . Alcohol use   . Anemia   . Anxiety   . Asthma   . Basal cell carcinoma    forehead  . Breast cancer ([place]) 1998   left   . Colon polyps   . GERD (gastroesophageal reflux disease)   . Glaucoma   . Hyperlipidemia   . Hypertension   . Hyponatremia    remote, mild  . Mitral valve prolapse   . Osteoarthritis    sees rheumatologist  . Osteoporosis    sees rheumatologist  . Personal history of radiation therapy 1998  . PMR (polymyalgia rheumatica) (HCC)    sees rheumatologist  . Pneumonia   . Prinzmetal angina ([place])   . White matter changes    ? ischemic sm vs per pt    Past Surgical History:  Procedure Laterality Date  . ABDOMINAL HYSTERECTOMY    . BREAST LUMPECTOMY Left 1998   cancer  . CESAREAN SECTION     x 4  . COLONOSCOPY  07/2012   in [place]  . HERNIA REPAIR  2005   abdominal hernia repair  . HYSTEROTOMY    . LYMPH NODE DISSECTION Left   . POLYPECTOMY      There were no vitals filed for this visit.   Subjective Assessment - 08/28/19 0848    Subjective Trouble with neck yesterda yafter eye MD appt.      Pertinent History bilateral symmetric sensorineural hearing loss:  bilateral occasional tinnitus; motion sickness; glaucoma; cataract lenses; HTN: osteoporosis; arthritis; trigeminal neuropathy:  polymyalgia rheumatica:  anxiety/depression    Currently in Pain? Yes    Pain Score 4     Pain Location Neck    Pain Orientation Right;Left    Pain Descriptors / Indicators Sore              [facility] PT Assessment - 08/28/19 0001      AROM   Cervical - Right Rotation 48    Cervical - Left Rotation 60                         [facility] Adult PT Treatment/Exercise - 08/28/19 0001      Neck Exercises: Machines for Strengthening   UBE (Upper Arm Bike) L1 1:30 sec each way feet supported      Neck Exercises: Seated   Other Seated Exercise Seated SNAGS with towel 10x Bil rotation, VC to not move her shoulders      Neck Exercises: Supine  Cervical Isometrics Right lateral flexion;Left lateral flexion;5 secs;5 reps    Neck Retraction 5 reps;5 secs      Manual Therapy   Manual Therapy Soft tissue mobilization;Myofascial release   Lt > RT: SCM and occiput in addition to UT and scalenes   Manual therapy comments elongation and trigger point release to bil neck and upper traps    Soft tissue mobilization Static stretching for Bil rotation, > RT                    PT Short Term Goals - 08/13/19 0853      PT SHORT TERM GOAL #4   Title be independent in initial HEP for neck    Status Achieved      PT SHORT TERM GOAL #5   Title demonstrate > or = to 40 degrees of cervical A/ROM rotation bilaterally to improve safety with driving    Status Achieved             PT Long Term Goals - 08/21/19 1605      PT LONG TERM GOAL #1   Title Decrease DHI by 10 points to indicate improvement in dizziness for improved quality of life.    Baseline DHI: 36%    Status Revised    Target Date 09/03/19      PT LONG TERM GOAL #2   Title Increase FGA score to >/= 24/30 points to  demo improved balance during gait for incr. safety.    Baseline 21/30    Status Revised    Target Date 09/03/19      PT LONG TERM GOAL #3   Title Independent in HEP for balance and vestibular exercises.    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 pt independent in home self SNAGS. We added siometric lateral flexion to HEP which pt reports doing in the past and declined visual aides. No pain at end of session. pt tolerated passive cervical stretching very well, gaining ROm in both directions of rotation.    PT Next Visit Plan pt as 2# weights at home and reports would use for UE HEP, begin this part of HEP. Pt also going to [employer].    PT Home Exercise Plan Access Code: [email or web address] for neck           Patient will benefit from skilled therapeutic intervention in order to improve the following deficits and impairments:     Visit Diagnosis: Cervicalgia  Cramp and spasm  Abnormal posture  Muscle weakness (generalized)     Problem List Patient Active Problem List   Diagnosis Date Noted  . Vertigo 08/06/2019  . Cough 08/06/2019  . Mixed rhinitis 08/06/2019  . Gastroesophageal reflux disease 08/06/2019  . Trigeminal neuropathy 11/13/2017  . PMR (polymyalgia rheumatica) (HCC) 04/24/2017  . Hyponatremia 07/15/2015  . Hyperlipidemia 07/15/2015  . Esophageal  spasm 07/15/2015  . Essential hypertension 07/15/2015  . Depression 07/15/2015  . Asthma 07/15/2015  . Tinnitus 07/15/2015  . Osteoporosis 07/15/2015   . Spasm   . Prinzmetal angina ([place])   . Mitral valve prolapse   . White matter changes     Ilyse Tremain, PTA 08/28/2019, 9:34 AM  [facility] [address] [place], [place], [address] Phone: [phone]   Fax:  [phone]  Name: Amanda Cannon MRN: [id number] Date of Birth: 26-Jul-1945

## 2019-08-29 ENCOUNTER — Encounter: Payer: Medicare Other | Admitting: Physical Therapy

## 2019-09-02 ENCOUNTER — Encounter: Payer: Medicare Other | Admitting: Physical Therapy

## 2019-09-04 ENCOUNTER — Other Ambulatory Visit: Payer: Self-pay

## 2019-09-04 ENCOUNTER — Encounter: Payer: Medicare Other | Admitting: Physical Therapy

## 2019-09-04 ENCOUNTER — Ambulatory Visit: Payer: Medicare Other

## 2019-09-04 DIAGNOSIS — R252 Cramp and spasm: Secondary | ICD-10-CM | POA: Diagnosis not present

## 2019-09-04 DIAGNOSIS — M6281 Muscle weakness (generalized): Secondary | ICD-10-CM

## 2019-09-04 DIAGNOSIS — R2681 Unsteadiness on feet: Secondary | ICD-10-CM | POA: Diagnosis not present

## 2019-09-04 DIAGNOSIS — R293 Abnormal posture: Secondary | ICD-10-CM

## 2019-09-04 DIAGNOSIS — M542 Cervicalgia: Secondary | ICD-10-CM | POA: Diagnosis not present

## 2019-09-04 DIAGNOSIS — R2689 Other abnormalities of gait and mobility: Secondary | ICD-10-CM | POA: Diagnosis not present

## 2019-09-04 NOTE — Therapy (Addendum)
[facility] [address], [place], [place], [address] Phone: [phone]   Fax:  [phone]  Physical Therapy Treatment  Patient Details  Name: Amanda Cannon MRN: [id number] Date of Birth: 19-May-1945 Referring Provider (PT): Presley Raddle, MD   Encounter Date: 09/04/2019   PT End of Session - 09/04/19 0932    Visit Number 25   10 neck, 15 vestibular   Date for PT Re-Evaluation 10/16/19   neck   Authorization Type Medicare.  POC for neck 10/16/19    Progress Note Due on Visit 29    PT Start Time 0845    PT Stop Time 0927    PT Time Calculation (min) 42 min    Activity Tolerance Patient tolerated treatment well    Behavior During Therapy [facility] for tasks assessed/performed           Past Medical History:  Diagnosis Date  . Alcohol use   . Anemia   . Anxiety   . Asthma   . Basal cell carcinoma    forehead  . Breast cancer ([place]) 1998   left   . Colon polyps   . GERD (gastroesophageal reflux disease)   . Glaucoma   . Hyperlipidemia   . Hypertension   . Hyponatremia    remote, mild  . Mitral valve prolapse   . Osteoarthritis    sees rheumatologist  . Osteoporosis    sees rheumatologist  . Personal history of radiation therapy 1998  . PMR (polymyalgia rheumatica) (HCC)    sees rheumatologist  . Pneumonia   . Prinzmetal angina ([place])   . White matter changes    ? ischemic sm vs per pt    Past Surgical History:  Procedure Laterality Date  . ABDOMINAL HYSTERECTOMY    . BREAST LUMPECTOMY Left 1998   cancer  . CESAREAN SECTION     x 4  . COL[place]COPY  07/2012   in [place]  . HERNIA REPAIR  2005   abdominal hernia repair  . HYSTEROTOMY    . LYMPH NODE DISSECTION Left   . POLYPECTOMY      There were no vitals filed for this visit.   Subjective Assessment - 09/04/19 0851    Subjective I have improved improved ROM.  I feel 35-40% better overall.    Currently in Pain? Yes    Pain Score 2      Pain Location Neck    Pain Orientation Right;Left    Pain Descriptors / Indicators Aching    Pain Type Chronic pain    Pain Onset More than a month ago    Pain Frequency Intermittent    Aggravating Factors  turning head, moving    Pain Relieving Factors massage              [facility] PT Assessment - 09/04/19 0001      Assessment   Medical Diagnosis neck pain    Referring Provider (PT) Presley Raddle, MD      Precautions   Precautions None      Prior Function   Level of Independence Independent      Observation/Other Assessments   Neck Disability Index  26% limitation      Posture/Postural Control   Posture/Postural Control Postural limitations    Postural Limitations Forward head;Flexed trunk;Rounded Shoulders    Posture Comments improved awareness and is making postural corrections      AROM   Cervical - Right Rotation 50    Cervical -  Left Rotation 60      Strength   Right Shoulder Flexion 4/5    Right Shoulder ABduction 4-/5    Right Shoulder External Rotation 4-/5    Left Shoulder Flexion 4/5    Left Shoulder ABduction 4-/5    Left Shoulder External Rotation 4-/5                           Trigger Point Dry Needling - 09/04/19 0001    Consent Given? Yes    Education Handout Provided Previously provided    Muscles Treated Head and Neck Upper trapezius;Cervical multifidi;Suboccipitals    Upper Trapezius Response Twitch reponse elicited;Palpable increased muscle length    Suboccipitals Response Twitch response elicited;Palpable increased muscle length    Cervical multifidi Response Twitch reponse elicited;Palpable increased muscle length                  PT Short Term Goals - 08/13/19 0853      PT SHORT TERM GOAL #4   Title be independent in initial HEP for neck    Status Achieved      PT SHORT TERM GOAL #5   Title demonstrate > or = to 40 degrees of cervical A/ROM rotation bilaterally to improve safety with driving    Status  Achieved             PT Long Term Goals - 09/04/19 0177      PT LONG TERM GOAL #5   Title be independent in advanced HEP for neck pain    Time 4    Period Weeks    Status On-going    Target Date 10/16/19      PT LONG TERM GOAL #6   Title reduce NDI to < or = to 10% to improve function with daily tasks    Baseline 26%    Time 4    Period Weeks    Status On-going    Target Date 10/16/19      PT LONG TERM GOAL #7   Title improve postural strength to sit with neutral/upright posture > or = to 75% of the time    Status Achieved      PT LONG TERM GOAL #8   Title demonstrate cervical A/ROM rotation to > or = to 50 degrees to improve safety with driving    Baseline 50 Rt, 60 Lt    Status Achieved      PT LONG TERM GOAL  #9   TITLE imrpove UE strength to > or = to 4-/5 to improve endurance for functional use    Baseline 4/5 abduction, 4-/5 flexion, ER 4-/5    Status Achieved                 Plan - 09/04/19 0931    Clinical Impression Statement Pt reports 35-40% overall improvement in symptoms since the start of care.  Pt with improved UE strength and cervical A/ROM although both still limited.  Pt continues to work on [employer] for further gains.  Pt is making postural corrections at home and demonstrates overall improved posture.  Pt will attend 2 more sessions for HEP advancement and 1 more session of dry needling.  Pt responded well to dry needling today with good response in cervical multifidi, upper traps and suboccipitals.    PT Frequency 2x / week   2 sessions probable   PT Duration 6 weeks    PT Treatment/Interventions ADLs/Self Care Home  Management;Vestibular;Gait training;Therapeutic activities;Therapeutic exercise;Balance training;Neuromuscular re-education;Patient/family education;Aquatic Therapy;Functional mobility training    PT Next Visit Plan pt as 2# weights at home and reports would use for UE HEP, begin this part of HEP. Pt also going to [employer].  1 more  session for HEP advancement and additional session for final DN    PT Home Exercise Plan Access Code: [email or web address] for neck    Consulted and Agree with Plan of Care Patient           Patient will benefit from skilled therapeutic intervention in order to improve the following deficits and impairments:  Difficulty walking, Decreased balance, Impaired vision/preception, Dizziness  Visit Diagnosis: Cervicalgia  Cramp and spasm  Abnormal posture  Muscle weakness (generalized)     Problem List Patient Active Problem List   Diagnosis Date Noted  . Vertigo 08/06/2019  . Cough 08/06/2019  . Mixed rhinitis 08/06/2019  . Gastroesophageal reflux disease 08/06/2019  . Trigeminal neuropathy 11/13/2017  . PMR (polymyalgia rheumatica) (HCC) 04/24/2017  . Hyponatremia 07/15/2015  . Hyperlipidemia 07/15/2015  . Esophageal spasm 07/15/2015  . Essential hypertension 07/15/2015  . Depression 07/15/2015  . Asthma 07/15/2015  . Tinnitus 07/15/2015  . Osteoporosis 07/15/2015  . Spasm   . Prinzmetal angina ([place])   . Mitral valve prolapse   . White matter changes      Sigurd Sos, PT 09/04/19 9:35 AM  [facility] [address], [place], [place], [address] Phone: [phone]   Fax:  [phone]  Name: Amanda Cannon MRN: [id number] Date of Birth: [birth year]

## 2019-09-05 ENCOUNTER — Other Ambulatory Visit: Payer: Self-pay | Admitting: Family Medicine

## 2019-09-06 ENCOUNTER — Ambulatory Visit: Payer: Medicare Other | Attending: Otolaryngology | Admitting: Physical Therapy

## 2019-09-06 ENCOUNTER — Encounter: Payer: Self-pay | Admitting: Physical Therapy

## 2019-09-06 ENCOUNTER — Other Ambulatory Visit: Payer: Self-pay

## 2019-09-06 DIAGNOSIS — R2681 Unsteadiness on feet: Secondary | ICD-10-CM | POA: Insufficient documentation

## 2019-09-06 DIAGNOSIS — R2689 Other abnormalities of gait and mobility: Secondary | ICD-10-CM | POA: Diagnosis not present

## 2019-09-06 DIAGNOSIS — R42 Dizziness and giddiness: Secondary | ICD-10-CM | POA: Insufficient documentation

## 2019-09-06 NOTE — Therapy (Addendum)
[facility] [address] [place], [place], [address] Phone: [phone]   Fax:  [phone]  Physical Therapy Treatment  Patient Details  Name: Amanda Cannon MRN: [id number] Date of Birth: 12-12-1945 Referring Provider (PT): Leta Baptist, MD   Encounter Date: 09/06/2019   PT End of Session - 09/06/19 1635    Visit Number 26   16 vestibular/ 10 neck - 26 total   Number of Visits 20   vestibular   Date for PT Re-Evaluation 10/06/19   vestibular   Authorization Type Medicare.    Progress Note Due on Visit 29    PT Start Time 1531    PT Stop Time 1615    PT Time Calculation (min) 44 min    Activity Tolerance Other (comment)   limited by dizziness   Behavior During Therapy [facility] for tasks assessed/performed           Past Medical History:  Diagnosis Date  . Alcohol use   . Anemia   . Anxiety   . Asthma   . Basal cell carcinoma    forehead  . Breast cancer ([place]) 1998   left   . Colon polyps   . GERD (gastroesophageal reflux disease)   . Glaucoma   . Hyperlipidemia   . Hypertension   . Hyponatremia    remote, mild  . Mitral valve prolapse   . Osteoarthritis    sees rheumatologist  . Osteoporosis    sees rheumatologist  . Personal history of radiation therapy 1998  . PMR (polymyalgia rheumatica) (HCC)    sees rheumatologist  . Pneumonia   . Prinzmetal angina ([place])   . White matter changes    ? ischemic sm vs per pt    Past Surgical History:  Procedure Laterality Date  . ABDOMINAL HYSTERECTOMY    . BREAST LUMPECTOMY Left 1998   cancer  . CESAREAN SECTION     x 4  . COLONOSCOPY  07/2012   in [place]  . HERNIA REPAIR  2005   abdominal hernia repair  . HYSTEROTOMY    . LYMPH NODE DISSECTION Left   . POLYPECTOMY      There were no vitals filed for this visit.   Subjective Assessment - 09/06/19 1536    Subjective Pt does demonstrate improvement in cervical ROM.  Pt reports she has been outside in the  heat a lot and feels more dizzy, feels like her head is pulling down and to the L.    Pertinent History bilateral symmetric sensorineural hearing loss:  bilateral occasional tinnitus; motion sickness; glaucoma; cataract lenses; HTN: osteoporosis; arthritis; trigeminal neuropathy:  polymyalgia rheumatica:  anxiety/depression    Currently in Pain? Yes    Pain Score 2     Pain Location Head    Pain Orientation Anterior    Pain Descriptors / Indicators Headache    Pain Type Acute pain              [facility] PT Assessment - 09/06/19 1539      Assessment   Medical Diagnosis Vertigo; Imbalance     Referring Provider (PT) Leta Baptist, MD    Onset Date/Surgical Date --   approx. 4 years ago   Prior Therapy for vertigo-at neurorehab      Precautions   Precautions None      Prior Function   Level of Independence Independent      Observation/Other Assessments   Focus on Therapeutic Outcomes (FOTO)  decreased to 56%  Other Surveys  Dizziness Handicap Inventory [facility])    Dizziness Handicap Inventory [facility])  increased to 66%      Ambulation/Gait   Ambulation/Gait Yes    Ambulation/Gait Assistance 5: Supervision    Ambulation/Gait Assistance Details close supervision due to increased imbalance today, shuffling of gait    Ambulation Distance (Feet) 300 Feet    Assistive device None    Gait Pattern Step-through pattern;Shuffle;Poor foot clearance - left;Poor foot clearance - right    Ambulation Surface Level;Indoor      Functional Gait  Assessment   Gait assessed  Yes    Gait Level Surface Walks 20 ft in less than 7 sec but greater than 5.5 sec, uses assistive device, slower speed, mild gait deviations, or deviates 6-10 in outside of the 12 in walkway width.    Change in Gait Speed Able to change speed, demonstrates mild gait deviations, deviates 6-10 in outside of the 12 in walkway width, or no gait deviations, unable to achieve a major change in velocity, or uses a change in velocity, or uses an  assistive device.    Gait with Horizontal Head Turns Performs head turns with moderate changes in gait velocity, slows down, deviates 10-15 in outside 12 in walkway width but recovers, can continue to walk.    Gait with Vertical Head Turns Performs task with moderate change in gait velocity, slows down, deviates 10-15 in outside 12 in walkway width but recovers, can continue to walk.    Gait and Pivot Turn Pivot turns safely in greater than 3 sec and stops with no loss of balance, or pivot turns safely within 3 sec and stops with mild imbalance, requires small steps to catch balance.    Step Over Obstacle Is able to step over one shoe box (4.5 in total height) but must slow down and adjust steps to clear box safely. May require verbal cueing.    Gait with Narrow Base of Support Is able to ambulate for 10 steps heel to toe with no staggering.    Gait with Eyes Closed Cannot walk 20 ft without assistance, severe gait deviations or imbalance, deviates greater than 15 in outside 12 in walkway width or will not attempt task.    Ambulating Backwards Walks 20 ft, uses assistive device, slower speed, mild gait deviations, deviates 6-10 in outside 12 in walkway width.    Steps Alternating feet, must use rail.    Total Score 16    FGA comment: 16/30 no hip pain, feet shuffling today.  Moderate dizziness today during FGA               Vestibular Assessment - 09/06/19 1606      Positional Sensitivities   Sit to Supine No dizziness    Supine to Left Side No dizziness    Supine to Right Side No dizziness    Supine to Sitting No dizziness    Nose to Right Knee No dizziness    Right Knee to Sitting Moderate dizziness    Nose to Left Knee No dizziness    Left Knee to Sitting Mild dizziness    Head Turning x 5 Moderate dizziness    Head Nodding x 5 No dizziness    Pivot Right in Standing Mild dizziness    Pivot Left in Standing No dizziness    Rolling Right No dizziness    Rolling Left No dizziness     Positional Sensitivities Comments bending down was most symptomatic  PT Education - 09/06/19 1633    Education Details decline in overall function/progress; discussed that it may be incidental but will recertify for 4 more visits to return to previous level of function and independence with HEP    Person(s) Educated Patient    Methods Explanation    Comprehension Verbalized understanding            PT Short Term Goals - 08/13/19 0853      PT SHORT TERM GOAL #4   Title be independent in initial HEP for neck    Status Achieved      PT SHORT TERM GOAL #5   Title demonstrate > or = to 40 degrees of cervical A/ROM rotation bilaterally to improve safety with driving    Status Achieved             PT Long Term Goals - 09/06/19 1553      PT LONG TERM GOAL #1   Title Decrease DHI by 10 points to indicate improvement in dizziness for improved quality of life.    Baseline DHI: 36% increased to 66%    Status Not Met      PT LONG TERM GOAL #2   Title Increase FGA score to >/= 24/30 points to demo improved balance during gait for incr. safety.    Baseline 21/30 > 16/30    Status Not Met      PT LONG TERM GOAL #3   Title Independent in HEP for balance and vestibular exercises.    Status Achieved      PT LONG TERM GOAL #4   Title Report symptoms no greater than 1/5 on MSQ for head turns/nods, body turns, and bending down to the ground to allow pt to be more independent with household activities    Baseline symptoms increased today    Status Not Met            New goals for recertification:  PT Short Term Goals - 09/06/19 1649      PT SHORT TERM GOAL #1   Title = LTG for vestibular           PT Long Term Goals - 09/06/19 1649      PT LONG TERM GOAL #1   Title Decrease DHI to <30% to indicate improvement in dizziness for improved quality of life.    Baseline DHI: 36% increased to 66%    Time 4    Period Weeks     Status Revised    Target Date 10/06/19      PT LONG TERM GOAL #2   Title Increase FGA score to >/= 23/30 points to demo improved balance during gait for incr. safety.    Baseline 21/30 > 16/30    Time 4    Period Weeks    Status Revised    Target Date 10/06/19      PT LONG TERM GOAL #3   Title Independent in HEP for balance and vestibular exercises.    Time 4    Period Weeks    Status Revised    Target Date 10/06/19      PT LONG TERM GOAL #4   Title Report symptoms no greater than 1/5 on MSQ for head turns/nods, body turns, and bending down to the ground to allow pt to be more independent with household activities    Baseline 3/5    Time 5    Period Weeks    Status Revised    Target Date 10/06/19  Plan - 09/06/19 1638    Clinical Impression Statement Performed assessment of progress towards vestibular goals.  Pt reports a significant increase in symptoms and significant decrease in stability this week after spending more time out in the heat.  Pt has experienced a significant decline based on FGA, MSQ, FOTO and DHI and demonstrates increased motion sensitivity, increased falls risk, and decrease in overall function. Pt met 1 LTG and is able to perform HEP independently.  Pt was making good progress and was on track to D/C today but due to significant decline, will recertify pt for 4 more visits to return patient to previous baseline, progress HEP and ensure safe D/C from therapy.  Pt agreeable to plan.    Personal Factors and Comorbidities Comorbidity 2;Past/Current Experience;Time since onset of injury/illness/exacerbation    Comorbidities trigeminal neuropathy, polymyalgia rheumatica, HTN, depression, arthritis, mitral valve prolapse; mild inferior abrasion in Rt external auditory canal per audiologist's report from 04-03-19 visit:  tinnitus, motion sickness, glaaucoma, sensorineural hearing loss, h/o breast cancer    Examination-Activity Limitations Locomotion  Level;Transfers;Bend;Stairs;Squat;Stand    Examination-Participation Restrictions Meal Prep;Cleaning;Community Activity;Driving;Shop    Stability/Clinical Decision Making --    Rehab Potential Good    PT Frequency 1x / week    PT Duration 4 weeks    PT Treatment/Interventions ADLs/Self Care Home Management;Vestibular;Gait training;Therapeutic activities;Therapeutic exercise;Balance training;Neuromuscular re-education;Patient/family education;Aquatic Therapy;Functional mobility training;Canalith Repostioning    PT Next Visit Plan Pt had decline - goals revised, will only do 4 more visits and then D/C.  Revise HEP.  x1 viewing, bending down to the ground, balance with eyes closed, gait with head turns/nods    PT Home Exercise Plan Access Code: [email or web address] for neck    Consulted and Agree with Plan of Care Patient           Patient will benefit from skilled therapeutic intervention in order to improve the following deficits and impairments:  Difficulty walking, Decreased balance, Impaired vision/preception, Dizziness  Visit Diagnosis: Unsteadiness on feet  Other abnormalities of gait and mobility  Dizziness and giddiness     Problem List Patient Active Problem List   Diagnosis Date Noted  . Vertigo 08/06/2019  . Cough 08/06/2019  . Mixed rhinitis 08/06/2019  . Gastroesophageal reflux disease 08/06/2019  . Trigeminal neuropathy 11/13/2017  . PMR (polymyalgia rheumatica) (HCC) 04/24/2017  . Hyponatremia 07/15/2015  . Hyperlipidemia 07/15/2015  . Esophageal spasm 07/15/2015  . Essential hypertension 07/15/2015  . Depression 07/15/2015  . Asthma 07/15/2015  . Tinnitus 07/15/2015  . Osteoporosis 07/15/2015  . Spasm   . Prinzmetal angina ([place])   . Mitral valve prolapse   . White matter changes    Rico Junker, PT, DPT 09/06/19    4:49 PM    [facility] [address] [place], [place], [address] Phone: [phone]    Fax:  [phone]  Name: Amanda Cannon MRN: [id number] Date of Birth: Jun 01, 1945

## 2019-09-10 ENCOUNTER — Ambulatory Visit: Payer: Medicare Other | Admitting: Physical Therapy

## 2019-09-13 ENCOUNTER — Other Ambulatory Visit: Payer: Self-pay

## 2019-09-13 ENCOUNTER — Encounter: Payer: Self-pay | Admitting: Physical Therapy

## 2019-09-13 ENCOUNTER — Ambulatory Visit: Payer: Medicare Other | Admitting: Physical Therapy

## 2019-09-13 DIAGNOSIS — M6281 Muscle weakness (generalized): Secondary | ICD-10-CM | POA: Diagnosis not present

## 2019-09-13 DIAGNOSIS — R2689 Other abnormalities of gait and mobility: Secondary | ICD-10-CM | POA: Diagnosis not present

## 2019-09-13 DIAGNOSIS — R252 Cramp and spasm: Secondary | ICD-10-CM | POA: Diagnosis not present

## 2019-09-13 DIAGNOSIS — R2681 Unsteadiness on feet: Secondary | ICD-10-CM

## 2019-09-13 DIAGNOSIS — R293 Abnormal posture: Secondary | ICD-10-CM | POA: Diagnosis not present

## 2019-09-13 DIAGNOSIS — M542 Cervicalgia: Secondary | ICD-10-CM | POA: Diagnosis not present

## 2019-09-13 NOTE — Therapy (Addendum)
[facility] [address] [place], [place], [address] Phone: [phone]   Fax:  [phone]  Physical Therapy Treatment  Patient Details  Name: Amanda Cannon MRN: [id number] Date of Birth: 1945/06/22 Referring Provider (PT): Leta Baptist, MD   Encounter Date: 09/13/2019   PT End of Session - 09/13/19 0841    Visit Number 27 (16 vestibular, 11 neck    Number of Visits 20    Date for PT Re-Evaluation 10/06/19    Authorization Type Medicare.    Authorization Time Period 05-09-19 - 08-06-19    Progress Note Due on Visit 29    PT Start Time 0838    PT Stop Time 0925    PT Time Calculation (min) 47 min    Activity Tolerance Patient tolerated treatment well           Past Medical History:  Diagnosis Date  . Alcohol use   . Anemia   . Anxiety   . Asthma   . Basal cell carcinoma    forehead  . Breast cancer ([place]) 1998   left   . Colon polyps   . GERD (gastroesophageal reflux disease)   . Glaucoma   . Hyperlipidemia   . Hypertension   . Hyponatremia    remote, mild  . Mitral valve prolapse   . Osteoarthritis    sees rheumatologist  . Osteoporosis    sees rheumatologist  . Personal history of radiation therapy 1998  . PMR (polymyalgia rheumatica) (HCC)    sees rheumatologist  . Pneumonia   . Prinzmetal angina ([place])   . White matter changes    ? ischemic sm vs per pt    Past Surgical History:  Procedure Laterality Date  . ABDOMINAL HYSTERECTOMY    . BREAST LUMPECTOMY Left 1998   cancer  . CESAREAN SECTION     x 4  . [place]  07/2012   in [place]  . HERNIA REPAIR  2005   abdominal hernia repair  . HYSTEROTOMY    . LYMPH NODE DISSECTION Left   . POLYPECTOMY      There were no vitals filed for this visit.   Subjective Assessment - 09/13/19 0843    Subjective After the last DN session my vestibular problem really increased, bad dizziness for days, the heat may be a contributing factor.     Pertinent History bilateral symmetric sensorineural hearing loss:  bilateral occasional tinnitus; motion sickness; glaucoma; cataract lenses; HTN: osteoporosis; arthritis; trigeminal neuropathy:  polymyalgia rheumatica:  anxiety/depression    Currently in Pain? Yes    Pain Score 2     Pain Location Neck    Pain Descriptors / Indicators Dull;Aching                             [facility] Adult PT Treatment/Exercise - 09/13/19 0001      Manual Therapy   Soft tissue mobilization Static stretching for Bil rotation, > RT:   Soft tissue to cervical Bil: SCM, scalenes, occiput                   PT Short Term Goals - 09/06/19 1649      PT SHORT TERM GOAL #1   Title = LTG for vestibular             PT Long Term Goals - 09/06/19 1649      PT LONG TERM GOAL #1   Title  Decrease DHI to <30% to indicate improvement in dizziness for improved quality of life.    Baseline DHI: 36% increased to 66%    Time 4    Period Weeks    Status Revised    Target Date 10/06/19      PT LONG TERM GOAL #2   Title Increase FGA score to >/= 23/30 points to demo improved balance during gait for incr. safety.    Baseline 21/30 > 16/30    Time 4    Period Weeks    Status Revised    Target Date 10/06/19      PT LONG TERM GOAL #3   Title Independent in HEP for balance and vestibular exercises.    Time 4    Period Weeks    Status Revised    Target Date 10/06/19      PT LONG TERM GOAL #4   Title Report symptoms no greater than 1/5 on MSQ for head turns/nods, body turns, and bending down to the ground to allow pt to be more independent with household activities    Baseline 3/5    Time 5    Period Weeks    Status Revised    Target Date 10/06/19                 Plan - 09/13/19 0842    Clinical Impression Statement Pt reports after her last dry needlign session her dizziness significantly increased. She thinks it may be more from the extreme heat and humidity as of late.  When she takes hersefl out of the heat her symptoms improved. Resting tone of cervical muscles contiues improve. 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    Personal Factors and Comorbidities Comorbidity 2;Past/Current Experience;Time since onset of injury/illness/exacerbation    Comorbidities trigeminal neuropathy, polymyalgia rheumatica, HTN, depression, arthritis, mitral valve prolapse; mild inferior abrasion in Rt external auditory canal per audiologist's report from 04-03-19 visit:  tinnitus, motion sickness, glaaucoma, sensorineural hearing loss, h/o breast cancer    Examination-Activity Limitations Locomotion Level;Transfers;Bend;Stairs;Squat;Stand    Examination-Participation Restrictions Meal Prep;Cleaning;Community Activity;Driving;Shop    Stability/Clinical Decision Making Evolving/Moderate complexity    Rehab Potential Good    PT Frequency 1x / week    PT Duration 4 weeks    PT Treatment/Interventions ADLs/Self Care Home Management;Vestibular;Gait training;Therapeutic activities;Therapeutic exercise;Balance training;Neuromuscular re-education;Patient/family education;Aquatic Therapy;Functional mobility training;Canalith Repostioning    PT Next Visit Plan 1 x more for DN, manual techniques, discharge per POC    PT Home Exercise Plan Access Code: [email or web address] for neck    Consulted and Agree with Plan of Care Patient           Patient will benefit from skilled therapeutic intervention in order to improve the following deficits and impairments:  Difficulty walking, Decreased balance, Impaired vision/preception, Dizziness  Visit Diagnosis: Unsteadiness on feet  Other abnormalities of gait and mobility     Problem List Patient Active Problem List   Diagnosis Date Noted  . Vertigo 08/06/2019  . Cough 08/06/2019  . Mixed rhinitis 08/06/2019  . Gastroesophageal reflux disease 08/06/2019  . Trigeminal neuropathy 11/13/2017  . PMR  (polymyalgia rheumatica) (HCC) 04/24/2017  . Hyponatremia 07/15/2015  . Hyperlipidemia 07/15/2015  . Esophageal spasm 07/15/2015  . Essential hypertension 07/15/2015  . Depression 07/15/2015  . Asthma 07/15/2015  . Tinnitus 07/15/2015  . Osteoporosis 07/15/2015  . Spasm   . Prinzmetal angina ([place])   . Mitral valve prolapse   .  White matter changes     Nakira Litzau, PTA 09/13/2019, 9:31 AM  [facility] [address], [place], [place], [address] Phone: [phone]   Fax:  [phone]  Name: Nusayba Cadenas MRN: [id number] Date of Birth: October 07, 1945

## 2019-09-17 ENCOUNTER — Encounter: Payer: Medicare Other | Admitting: Physical Therapy

## 2019-09-18 DIAGNOSIS — F4322 Adjustment disorder with anxiety: Secondary | ICD-10-CM | POA: Diagnosis not present

## 2019-09-18 DIAGNOSIS — F320 Major depressive disorder, single episode, mild: Secondary | ICD-10-CM | POA: Diagnosis not present

## 2019-09-23 ENCOUNTER — Other Ambulatory Visit: Payer: Self-pay

## 2019-09-23 ENCOUNTER — Encounter: Payer: Self-pay | Admitting: Physical Therapy

## 2019-09-23 ENCOUNTER — Ambulatory Visit: Payer: Medicare Other | Admitting: Physical Therapy

## 2019-09-23 DIAGNOSIS — R42 Dizziness and giddiness: Secondary | ICD-10-CM | POA: Diagnosis not present

## 2019-09-23 DIAGNOSIS — R2689 Other abnormalities of gait and mobility: Secondary | ICD-10-CM | POA: Diagnosis not present

## 2019-09-23 DIAGNOSIS — R2681 Unsteadiness on feet: Secondary | ICD-10-CM | POA: Diagnosis not present

## 2019-09-23 NOTE — Patient Instructions (Signed)
Gaze Stabilization: Standing Feet Apart (Compliant Surface)    Feet apart on pillow or folded blanket, keeping eyes on target on wall __6__ feet away, tilt head down 15-30 and move head side to side for _20_repetitions. Repeat while moving head up and down for 20 repetitions. Do __2__ sessions per day.  _____________________________________________________________________________________________________  Feet Apart (Compliant Surface) Head Motion - Eyes Closed    Stand on compliant surface with a chair in front of you, put one finger on the back of the chair for support: folded blanket with feet shoulder width apart. Close eyes and move head slowly, up and down 10 times and then side to side 10 times. Repeat 2 times per session. Do 1 sessions per day.   Backward Walking with Head turns/Head nods every 4 count    Walk backward with one hand touching counter top.  Turn your head to the right for a 4 count, head to the left for a 4 count.  Turn around and then look up for a 4 count, look down for a 4 count.  Repeat 4 laps.  Take long, even strides.    Copyright  VHI. All rights reserved.

## 2019-09-24 NOTE — Therapy (Addendum)
[facility] [address] [place], [place], [address] Phone: [phone]   Fax:  [phone]  Physical Therapy Treatment  Patient Details  Name: Amanda Cannon MRN: [id number] Date of Birth: 1945-11-30 Referring Provider (PT): Leta Baptist, MD   Encounter Date: 09/23/2019   PT End of Session - 09/24/19 1648    Visit Number 28   17 vestibular, 11 neck - 28 total   Number of Visits 20   vestibular   Date for PT Re-Evaluation 10/06/19   vestibular   Authorization Type Medicare.    Progress Note Due on Visit 29    PT Start Time 1705    PT Stop Time 1752    PT Time Calculation (min) 47 min    Activity Tolerance Patient tolerated treatment well    Behavior During Therapy WFL for tasks assessed/performed           Past Medical History:  Diagnosis Date  . Alcohol use   . Anemia   . Anxiety   . Asthma   . Basal cell carcinoma    forehead  . Breast cancer ([place]) 1998   left   . Colon polyps   . GERD (gastroesophageal reflux disease)   . Glaucoma   . Hyperlipidemia   . Hypertension   . Hyponatremia    remote, mild  . Mitral valve prolapse   . Osteoarthritis    sees rheumatologist  . Osteoporosis    sees rheumatologist  . Personal history of radiation therapy 1998  . PMR (polymyalgia rheumatica) (HCC)    sees rheumatologist  . Pneumonia   . Prinzmetal angina ([place])   . White matter changes    ? ischemic sm vs per pt    Past Surgical History:  Procedure Laterality Date  . ABDOMINAL HYSTERECTOMY    . BREAST LUMPECTOMY Left 1998   cancer  . CESAREAN SECTION     x 4  . COL[place]OSCOPY  07/2012   in [place]  . HERNIA REPAIR  2005   abdominal hernia repair  . HYSTEROTOMY    . LYMPH NODE DISSECTION Left   . POLYPECTOMY      There were no vitals filed for this visit.   Subjective Assessment - 09/23/19 1706    Subjective Pt feels much better than last vestibular session.  Thinks it is the heat that is  contributing to her dizziness and all other symptoms - believes it is a vasovagal response.  Had continued to do her exercises.    Pertinent History bilateral symmetric sensorineural hearing loss:  bilateral occasional tinnitus; motion sickness; glaucoma; cataract lenses; HTN: osteoporosis; arthritis; trigeminal neuropathy:  polymyalgia rheumatica:  anxiety/depression    Currently in Pain? No/denies           Reviewed and updated exercises below.  Initially attempted to perform with narrow BOS but pt unable to safely recover balance so maintained wide BOS but increased challenge by adding compliant surface.  Utilized Geologist, engineering for [employer] to assist with cervical proprioception and correct technique for x1 viewing.   Gaze Stabilization: Standing Feet Apart (Compliant Surface)    Feet apart on pillow or folded blanket, keeping eyes on target on wall __6__ feet away, tilt head down 15-30 and move head side to side for _20_repetitions. Repeat while moving head up and down for 20 repetitions. Do __2__ sessions per day.  _____________________________________________________________________________________________________  Feet Apart (Compliant Surface) Head Motion - Eyes Closed    Stand on compliant surface with a  chair in front of you, put one finger on the back of the chair for support: folded blanket with feet shoulder width apart. Close eyes and move head slowly, up and down 10 times and then side to side 10 times. Repeat 2 times per session. Do 1 sessions per day.   Backward Walking with Head turns/Head nods every 4 count    Walk backward with one hand touching counter top.  Turn your head to the right for a 4 count, head to the left for a 4 count.  Turn around and then look up for a 4 count, look down for a 4 count.  Repeat 4 laps.  Take long, even strides.      PT Education - 09/24/19 1647    Education Details due to pt returning to baseline function, updated and progressed HEP     Person(s) Educated Patient    Methods Explanation;Demonstration;Handout    Comprehension Verbalized understanding;Returned demonstration            PT Short Term Goals - 09/06/19 1649      PT SHORT TERM GOAL #1   Title = LTG for vestibular             PT Long Term Goals - 09/06/19 1649      PT LONG TERM GOAL #1   Title Decrease DHI to <30% to indicate improvement in dizziness for improved quality of life.    Baseline DHI: 36% increased to 66%    Time 4    Period Weeks    Status Revised    Target Date 10/06/19      PT LONG TERM GOAL #2   Title Increase FGA score to >/= 23/30 points to demo improved balance during gait for incr. safety.    Baseline 21/30 > 16/30    Time 4    Period Weeks    Status Revised    Target Date 10/06/19      PT LONG TERM GOAL #3   Title Independent in HEP for balance and vestibular exercises.    Time 4    Period Weeks    Status Revised    Target Date 10/06/19      PT LONG TERM GOAL #4   Title Report symptoms no greater than 1/5 on MSQ for head turns/nods, body turns, and bending down to the ground to allow pt to be more independent with household activities    Baseline 3/5    Time 5    Period Weeks    Status Revised    Target Date 10/06/19                 Plan - 09/24/19 1657    Clinical Impression Statement Pt reports significant improvement from previous vestibular session and believes symptoms are due to extreme heat.  Based on progress treatment session focused on progression of HEP with addition of compliant surfaces to exercises and changing gait with head turns to retro gait with head turns.  Pt continues to demonstrate poor cervical proprioception and poor awareness of abnormal postural strategies; continues to require verbal cues and visual feedback for self monitoring and self correction.  Will continue to progress towards LTG.    Personal Factors and Comorbidities Comorbidity 2;Past/Current Experience;Time since onset  of injury/illness/exacerbation    Comorbidities trigeminal neuropathy, polymyalgia rheumatica, HTN, depression, arthritis, mitral valve prolapse; mild inferior abrasion in Rt external auditory canal per audiologist's report from 04-03-19 visit:  tinnitus, motion sickness, glaaucoma, sensorineural hearing loss,  h/o breast cancer    Examination-Activity Limitations Locomotion Level;Transfers;Bend;Stairs;Squat;Stand    Examination-Participation Restrictions Meal Prep;Cleaning;Community Activity;Driving;Shop    Rehab Potential Good    PT Frequency 1x / week    PT Duration 4 weeks    PT Treatment/Interventions ADLs/Self Care Home Management;Vestibular;Gait training;Therapeutic activities;Therapeutic exercise;Balance training;Neuromuscular re-education;Patient/family education;Aquatic Therapy;Functional mobility training;Canalith Repostioning    PT Next Visit Plan continue to address postural control and balance strategies, rockerboard again, habituation to bending down to floor, walking with head turns in various directions-vary surfaces.  Neck proprioception with laser or mirror?    PT Home Exercise Plan Access Code: [email or web address] for neck    Consulted and Agree with Plan of Care Patient           Patient will benefit from skilled therapeutic intervention in order to improve the following deficits and impairments:  Difficulty walking, Decreased balance, Impaired vision/preception, Dizziness  Visit Diagnosis: Unsteadiness on feet  Other abnormalities of gait and mobility  Dizziness and giddiness     Problem List Patient Active Problem List   Diagnosis Date Noted  . Vertigo 08/06/2019  . Cough 08/06/2019  . Mixed rhinitis 08/06/2019  . Gastroesophageal reflux disease 08/06/2019  . Trigeminal neuropathy 11/13/2017  . PMR (polymyalgia rheumatica) (HCC) 04/24/2017  . Hyponatremia 07/15/2015  . Hyperlipidemia 07/15/2015  . Esophageal spasm 07/15/2015  . Essential hypertension 07/15/2015  .  Depression 07/15/2015  . Asthma 07/15/2015  . Tinnitus 07/15/2015  . Osteoporosis 07/15/2015  . Spasm   . Prinzmetal angina ([place])   . Mitral valve prolapse   . White matter changes     Rico Junker, PT, DPT 09/24/19    5:04 PM    [facility] [address] [place], [place], [address] Phone: [phone]   Fax:  [phone]  Name: Amanda Cannon MRN: [id number] Date of Birth: 1945-11-15

## 2019-09-30 NOTE — Progress Notes (Addendum)
Cardiology Office Note:    Date:  10/03/2019   ID:  Amanda Cannon, DOB 10-Feb-1945, MRN [id number]  PCP:  [name], MD  Cardiologist:  Sinclair Grooms, MD   Referring MD: [name], MD   Chief Complaint  Patient presents with  . Hypertension  . Hyperlipidemia  . Advice Only    Carotid disease    History of Present Illness:    Amanda Cannon is a 74 y.o. female with a hx of hx of hypertension, .polymyalgia rheumatica. hyperlipidemia,Prinzmetal's Angina,and less than 39% bilateral carotid obstruction 2018  Feels well.  No angina or neurological symptoms.  No medication side effects.  Denies orthopnea and PND.  Past Medical History:  Diagnosis Date  . Alcohol use   . Anemia   . Anxiety   . Asthma   . Basal cell carcinoma    forehead  . Breast cancer ([place]) 1998   left   . Colon polyps   . GERD (gastroesophageal reflux disease)   . Glaucoma   . Hyperlipidemia   . Hypertension   . Hyponatremia    remote, mild  . Mitral valve prolapse   . Osteoarthritis    sees rheumatologist  . Osteoporosis    sees rheumatologist  . Personal history of radiation therapy 1998  . PMR (polymyalgia rheumatica) (HCC)    sees rheumatologist  . Pneumonia   . Prinzmetal angina ([place])   . White matter changes    ? ischemic sm vs per pt    Past Surgical History:  Procedure Laterality Date  . ABDOMINAL HYSTERECTOMY    . BREAST LUMPECTOMY Left 1998   cancer  . CESAREAN SECTION     x 4  . [place]  07/2012   in [place]  . HERNIA REPAIR  2005   abdominal hernia repair  . HYSTEROTOMY    . LYMPH NODE DISSECTION Left   . POLYPECTOMY      Current Medications: Current Meds  Medication Sig  . Alirocumab (PRALUENT) 150 MG/ML SOAJ Inject 1 pen into the skin every 14 (fourteen) days.  Marland Kitchen amLODipine (NORVASC) 5 MG tablet TAKE 1 TABLET BY MOUTH EVERY DAY  . aspirin 81 MG tablet Take 81 mg by mouth daily.  . cetirizine (ZYRTEC) 10 MG tablet Take 10 mg by mouth 2  (two) times daily.   Marland Kitchen CLINPRO 5000 1.1 % PSTE See admin instructions.  Marland Kitchen denosumab (PROLIA) 60 MG/ML SOLN injection Inject 60 mg into the skin every 6 (six) months. Administer in upper arm, thigh, or abdomen  . famotidine (PEPCID) 40 MG tablet Take 1 tablet (40 mg total) by mouth daily.  . Fluticasone Propionate (FLONASE NA)   . furosemide (LASIX) 20 MG tablet Take 20 mg by mouth daily.  Marland Kitchen LORazepam (ATIVAN) 1 MG tablet Take 1 mg by mouth at bedtime as needed (spasms).  Marland Kitchen MAGNESIUM PO Take 400 mg by mouth daily.   . pantoprazole (PROTONIX) 20 MG tablet Take 1 tablet (20 mg total) by mouth daily.  . valsartan (DIOVAN) 160 MG tablet TAKE 1/2 TABLET BY MOUTH EVERY DAY     Allergies:   Tylenol [acetaminophen], Turmeric, Erythromycin, Erythromycin base, Garlic, Other, and Sulfa antibiotics   Social History   Socioeconomic History  . Marital status: Married    Spouse name: Zenia Resides   . Number of children: 3  . Years of education: 54  . Highest education level: Some college, no degree  Occupational History  . Occupation: Psychologist, occupational  Tobacco Use  .  Smoking status: Former Smoker    Packs/day: 1.00    Years: 7.00    Pack years: 7.00    Quit date: 01/25/1971    Years since quitting: 48.7  . Smokeless tobacco: Never Used  Vaping Use  . Vaping Use: Never used  Substance and Sexual Activity  . Alcohol use: Yes    Alcohol/week: 14.0 standard drinks    Types: 14 Standard drinks or equivalent per week    Comment: two drinks with burbon + water daily   . Drug use: No  . Sexual activity: Yes  Other Topics Concern  . 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 Difficulty of Paying Living  Expenses: Not hard at all  Food Insecurity: No Food Insecurity  . Worried About Charity fundraiser in the Last Year: Never true  . Ran Out of Food in the Last Year: Never true  Transportation Needs: No Transportation Needs  . Lack of Transportation (Medical): No  . Lack of Transportation (Non-Medical): No  Physical Activity: Insufficiently Active  . Days of Exercise per Week: 3 days  . Minutes of Exercise per Session: 20 min  Stress:   . Feeling of Stress : Not on file  Social Connections: Socially Integrated  . Frequency of Communication with Friends and Family: More than three times a week  . Frequency of Social Gatherings with Friends and Family: Once a week  . Attends Religious Services: More than 4 times per year  . Active Member of Clubs or Organizations: Yes  . Attends Archivist Meetings: More than 4 times per year  . Marital Status: Married     Family History: The patient's family history includes Cancer - Ovarian in her sister; Colon cancer in her cousin; Coronary artery disease (age of onset: 42) in her father; Coronary artery disease (age of onset: 42) in her mother. There is no history of Esophageal cancer, Rectal cancer, or Stomach cancer.  ROS:   Please see the history of present illness.    Osteoporosis/osteopenia.  All other systems reviewed and are negative.  EKGs/Labs/Other Studies Reviewed:    The following studies were reviewed today: No new imaging  EKG:  EKG normal sinus rhythm with normal appearance.  Recent Labs: No results found for requested labs within last 8760 hours.  Recent Lipid Panel    Component Value Date/Time   CHOL 216 (H) 09/12/2018 1013   TRIG 137 09/12/2018 1013   TRIG 174 (H) 06/24/2016 0848   HDL 103 09/12/2018 1013   HDL 74 06/24/2016 0848   CHOLHDL 2.1 09/12/2018 1013   LDLCALC 86 09/12/2018 1013    Physical Exam:    VS:  BP 126/82   Pulse 93   Ht 4\' 8"  (1.422 m)   Wt 99 lb 6.4 oz (45.1 kg)   BMI 22.29 kg/m      Wt Readings from Last 3 Encounters:  10/03/19 99 lb 6.4 oz (45.1 kg)  05/29/19 100 lb 8 oz (45.6 kg)  02/12/19 105 lb 9.6 oz (47.9 kg)     GEN: Appears younger than  stated age. No acute distress HEENT: Normal NECK: No JVD. LYMPHATICS: No lymphadenopathy CARDIAC:  RRR without murmur, gallop, or edema. VASCULAR:  Normal Pulses. No bruits. RESPIRATORY:  Clear to auscultation without rales, wheezing or rhonchi  ABDOMEN: Soft, non-tender, non-distended, No pulsatile mass, MUSCULOSKELETAL: No deformity  SKIN: Warm and dry NEUROLOGIC:  Alert and oriented x 3 PSYCHIATRIC:  Normal affect   ASSESSMENT:    1. Other hyperlipidemia   2. Prinzmetal angina ([place])   3. Essential hypertension   4. Mitral valve prolapse   5. Educated about COVID-19 virus infection    PLAN:    In order of problems listed above:  1. Doing well on Repatha.  LDL less than 100 in the setting of familial hyperlipidemia. 2. No angina. 3. Excellent blood pressure control on furosemide, Norvasc, and valsartan. 4. She does not have mitral valve prolapse 5. Vaccinated and seeking booster.  Medication is being practiced.  Encouraged more exertional activities to bring total weekly moderate activity up to 150 minutes.  Overall education and awareness concerning primary/secondary risk prevention was discussed in detail: LDL less than 70, hemoglobin A1c less than 7, blood pressure target less than 130/80 mmHg, >150 minutes of moderate aerobic activity per week, avoidance of smoking, weight control (via diet and exercise), and continued surveillance/management of/for obstructive sleep apnea.   Medication Adjustments/Labs and Tests Ordered: Current medicines are reviewed at length with the patient today.  Concerns regarding medicines are outlined above.  Orders Placed This Encounter  Procedures  . EKG 12-Lead   No orders of the defined types were placed in this encounter.   Patient Instructions  Medication  Instructions:  Your physician recommends that you continue on your current medications as directed. Please refer to the Current Medication list given to you today.  *If you need a refill on your cardiac medications before your next appointment, please call your pharmacy*   Lab Work: None If you have labs (blood work) drawn today and your tests are completely normal, you will receive your results only by: Marland Kitchen MyChart Message (if you have MyChart) OR . A paper copy in the mail If you have any lab test that is abnormal or we need to change your treatment, we will call you to review the results.   Testing/Procedures: None   Follow-Up: At [facility], you and your health needs are our priority.  As part of our continuing mission to provide you with exceptional heart care, we have created designated Provider Care Teams.  These Care Teams include your primary Cardiologist (physician) and Advanced Practice Providers (APPs -  Physician Assistants and Nurse Practitioners) who all work together to provide you with the care you need, when you need it.  We recommend signing up for the patient portal called "MyChart".  Sign up information is provided on this After Visit Summary.  MyChart is used to connect with patients for Virtual Visits (Telemedicine).  Patients are able to view lab/test results, encounter notes, upcoming appointments, etc.  Non-urgent messages can be sent to your provider as well.   To learn more about what you can do with MyChart, go to [email or web address].    Your next appointment:   12 month(s)  The format for your next appointment:   In Person  Provider:   You may see Sinclair Grooms, MD or one of the following Advanced Practice Providers on your designated Care Team:    Truitt Merle, NP  Cecilie Kicks, NP  Kathyrn Drown, NP  Other Instructions  Your provider recommends that you maintain 150 minutes per week of moderate aerobic activity.       Signed, Sinclair Grooms, MD  10/03/2019 4:16 PM    [facility]

## 2019-10-01 ENCOUNTER — Encounter: Payer: Medicare Other | Admitting: Physical Therapy

## 2019-10-02 ENCOUNTER — Other Ambulatory Visit: Payer: Self-pay

## 2019-10-02 ENCOUNTER — Encounter: Payer: Self-pay | Admitting: Physical Therapy

## 2019-10-02 ENCOUNTER — Ambulatory Visit: Payer: Medicare Other | Attending: Otolaryngology | Admitting: Physical Therapy

## 2019-10-02 DIAGNOSIS — R2689 Other abnormalities of gait and mobility: Secondary | ICD-10-CM | POA: Insufficient documentation

## 2019-10-02 DIAGNOSIS — R2681 Unsteadiness on feet: Secondary | ICD-10-CM | POA: Diagnosis not present

## 2019-10-02 DIAGNOSIS — R42 Dizziness and giddiness: Secondary | ICD-10-CM | POA: Diagnosis not present

## 2019-10-02 NOTE — Therapy (Addendum)
[facility] [address] [place], [place], [address] Phone: [phone]   Fax:  [phone]  Physical Therapy Treatment and 10th Visit Progress Note  Patient Details  Name: Amanda Cannon MRN: [id number] Date of Birth: 12-28-1945 Referring Provider (PT): Leta Baptist, MD   Encounter Date: 10/02/2019   Progress Note Reporting Period 08/13/2019 to 10/02/2019  See note below for Objective Data and Assessment of Progress/Goals.    PT End of Session - 10/02/19 0756    Visit Number 29   18 vestibular, 11 neck   Number of Visits 20   vestibular   Date for PT Re-Evaluation 10/06/19   vestibular   Authorization Type Medicare.    Progress Note Due on Visit 29    PT Start Time 0717    PT Stop Time 0800    PT Time Calculation (min) 43 min    Activity Tolerance Patient tolerated treatment well    Behavior During Therapy [facility] for tasks assessed/performed           Past Medical History:  Diagnosis Date  . Alcohol use   . Anemia   . Anxiety   . Asthma   . Basal cell carcinoma    forehead  . Breast cancer ([place]) 1998   left   . Colon polyps   . GERD (gastroesophageal reflux disease)   . Glaucoma   . Hyperlipidemia   . Hypertension   . Hyponatremia    remote, mild  . Mitral valve prolapse   . Osteoarthritis    sees rheumatologist  . Osteoporosis    sees rheumatologist  . Personal history of radiation therapy 1998  . PMR (polymyalgia rheumatica) (HCC)    sees rheumatologist  . Pneumonia   . Prinzmetal angina ([place])   . White matter changes    ? ischemic sm vs per pt    Past Surgical History:  Procedure Laterality Date  . ABDOMINAL HYSTERECTOMY    . BREAST LUMPECTOMY Left 1998   cancer  . CESAREAN SECTION     x 4  . [place]COPY  07/2012   in [place]  . HERNIA REPAIR  2005   abdominal hernia repair  . HYSTEROTOMY    . LYMPH NODE DISSECTION Left   . POLYPECTOMY      There were no vitals filed for this visit.    Subjective Assessment - 10/02/19 0719    Subjective Worked at [employer] this past weekend and is working again this weekend, stood for >6 hours.  Is also working at the [employer] this weekend but will be inside in the air conditioning.    Pertinent History bilateral symmetric sensorineural hearing loss:  bilateral occasional tinnitus; motion sickness; glaucoma; cataract lenses; HTN: osteoporosis; arthritis; trigeminal neuropathy:  polymyalgia rheumatica:  anxiety/depression    Currently in Pain? No/denies                              Vestibular Treatment/Exercise - 10/02/19 0724      Vestibular Treatment/Exercise   Vestibular Treatment Provided Gaze    Gaze Exercises X1 Viewing Horizontal;X1 Viewing Vertical      X1 Viewing Horizontal   Foot Position feet staggered, L then R forwards    Reps 2    Comments 30 seconds each with rest break in between due to dizziness; one finger touch for balance and for feedback for body position and to keep body from moving  X1 Viewing Vertical   Foot Position feet staggered, L then R forwards    Reps 2    Comments 30 seconds each with rest break in between due to dizziness; one finger touch for balance and for feedback for body position and to keep body from moving              Balance Exercises - 10/02/19 0741      Balance Exercises: Standing   Standing Eyes Closed Narrow base of support (BOS);Head turns;Solid surface;Other reps (comment)   feet together; 10 reps head nods/turns   Wall Bumps Hip;Eyes closed;10 reps   feet apart   Turning Right;Left;5 reps;Limitations    Turning Limitations in a series of 90 deg turns with copmensatory strategy of turning eyes/head first and then body             PT Education - 10/02/19 0756    Education Details updated and finalized HEP    Person(s) Educated Patient    Methods Explanation;Demonstration;Handout    Comprehension Verbalized understanding;Returned  demonstration          Gaze Stabilization: Standing Feet Partial Heel-Toe    Feet in partial heel-toe position (right foot forwards, left foot back) keeping eyes fixed on target on wall __3__ feet away, tilt head down 15-30 and move head side to side for __30__ seconds. Repeat while moving head up and down for __30__ seconds.  Switch feet and repeat 30 seconds each.  _________________________________________________________________________________________   Backward Walking with Head turns/Head nods every 4 count    Walk backward with one hand touching counter top.  Turn your head to the right for a 4 count, head to the left for a 4 count.  Turn around and then look up for a 4 count, look down for a 4 count.  Repeat 4 laps.  Take long, even strides.    _________________________________________________________________________________________  Feet Together, Head Motion - Eyes Closed      Standing in a corner with chair in front for support if needed.  With eyes closed and feet together, move head slowly, up and down 10 times with control.  Then move head up and down 10 times with control. Repeat 1 times per session. Do 2 sessions per day.   _________________________________________________________________________________________  Turning in Place: Compensatory Strategy    Standing in place, first move eyes to target at eye level located to right side. Keeping eyes on target, turn head then body toward target. Repeat sequence with target on each wall to complete full turn. Repeat __2__ times per session. Do __2__ sessions per day.  _________________________________________________________________________________________  Weight Shift: Anterior / Posterior (Righting / Equilibrium)    Start with back leaning against a wall, step feet further forwards.  Close your eyes.  Slowly shift weight forwards through shoulders and then hips over feet.  Stabilize and then shift  hips backwards slowly to wall.  Repeat __10__ times per session. Do __2__ sessions per day.  Copyright  VHI. All rights reserved.           PT Short Term Goals - 09/06/19 1649      PT SHORT TERM GOAL #1   Title = LTG for vestibular             PT Long Term Goals - 09/06/19 1649      PT LONG TERM GOAL #1   Title Decrease DHI to <30% to indicate improvement in dizziness for improved quality of life.    Baseline DHI:  36% increased to 66%    Time 4    Period Weeks    Status Revised    Target Date 10/06/19      PT LONG TERM GOAL #2   Title Increase FGA score to >/= 23/30 points to demo improved balance during gait for incr. safety.    Baseline 21/30 > 16/30    Time 4    Period Weeks    Status Revised    Target Date 10/06/19      PT LONG TERM GOAL #3   Title Independent in HEP for balance and vestibular exercises.    Time 4    Period Weeks    Status Revised    Target Date 10/06/19      PT LONG TERM GOAL #4   Title Report symptoms no greater than 1/5 on MSQ for head turns/nods, body turns, and bending down to the ground to allow pt to be more independent with household activities    Baseline 3/5    Time 5    Period Weeks    Status Revised    Target Date 10/06/19                 Plan - 10/02/19 0859    Clinical Impression Statement Pt reports not having a compliant surface to perform exercises on.  Treatment session focused on adjusting HEP to continue to address balance, vestibular and postural control impairments without compliant surface; utilized more narrow, staggered stance/BOS.  Also added compensatory strategies and balance reaction training to HEP.  Will assess LTG and plan to D/C next session.    Personal Factors and Comorbidities Comorbidity 2;Past/Current Experience;Time since onset of injury/illness/exacerbation    Comorbidities trigeminal neuropathy, polymyalgia rheumatica, HTN, depression, arthritis, mitral valve prolapse; mild inferior  abrasion in Rt external auditory canal per audiologist's report from 04-03-19 visit:  tinnitus, motion sickness, glaaucoma, sensorineural hearing loss, h/o breast cancer    Examination-Activity Limitations Locomotion Level;Transfers;Bend;Stairs;Squat;Stand    Examination-Participation Restrictions Meal Prep;Cleaning;Community Activity;Driving;Shop    Rehab Potential Good    PT Frequency 1x / week    PT Duration 4 weeks    PT Treatment/Interventions ADLs/Self Care Home Management;Vestibular;Gait training;Therapeutic activities;Therapeutic exercise;Balance training;Neuromuscular re-education;Patient/family education;Aquatic Therapy;Functional mobility training;Canalith Repostioning    PT Next Visit Plan Vestibular-check LTG, D/C    PT Home Exercise Plan Access Code: [email or web address] for neck    Consulted and Agree with Plan of Care Patient           Patient will benefit from skilled therapeutic intervention in order to improve the following deficits and impairments:  Difficulty walking, Decreased balance, Impaired vision/preception, Dizziness  Visit Diagnosis: Unsteadiness on feet  Other abnormalities of gait and mobility  Dizziness and giddiness     Problem List Patient Active Problem List   Diagnosis Date Noted  . Vertigo 08/06/2019  . Cough 08/06/2019  . Mixed rhinitis 08/06/2019  . Gastroesophageal reflux disease 08/06/2019  . Trigeminal neuropathy 11/13/2017  . PMR (polymyalgia rheumatica) (HCC) 04/24/2017  . Hyponatremia 07/15/2015  . Hyperlipidemia 07/15/2015  . Esophageal spasm 07/15/2015  . Essential hypertension 07/15/2015  . Depression 07/15/2015  . Asthma 07/15/2015  . Tinnitus 07/15/2015  . Osteoporosis 07/15/2015  . Spasm   . Prinzmetal angina ([place])   . Mitral valve prolapse   . White matter changes     Rico Junker, PT, DPT 10/02/19    9:02 AM    [facility] [address] [place], [place],  [address] Phone: [phone]   Fax:  [phone]  Name: Alanie Syler MRN: [id number] Date of Birth: 11-11-1945

## 2019-10-02 NOTE — Patient Instructions (Signed)
Gaze Stabilization: Standing Feet Partial Heel-Toe    Feet in partial heel-toe position (right foot forwards, left foot back) keeping eyes fixed on target on wall __3__ feet away, tilt head down 15-30 and move head side to side for __30__ seconds. Repeat while moving head up and down for __30__ seconds.  Switch feet and repeat 30 seconds each.  _____________________________________________________________________________________________________    Backward Walking with Head turns/Head nods every 4 count    Walk backward with one hand touching counter top.  Turn your head to the right for a 4 count, head to the left for a 4 count.  Turn around and then look up for a 4 count, look down for a 4 count.  Repeat 4 laps.  Take long, even strides.    ___________________________________________________________________________________________________________________________________________   Feet Together, Head Motion - Eyes Closed      Standing in a corner with chair in front for support if needed.  With eyes closed and feet together, move head slowly, up and down 10 times with control.  Then move head up and down 10 times with control. Repeat 1 times per session. Do 2 sessions per day.   __________________________________________________________________________________________________________________________________________Turning in Place: Compensatory Strategy    Standing in place, first move eyes to target at eye level located to right side. Keeping eyes on target, turn head then body toward target. Repeat sequence with target on each wall to complete full turn. Repeat __2__ times per session. Do __2__ sessions per day.  _______________________________________________________________________________________________________________________________________  Weight Shift: Anterior / Posterior (Righting / Equilibrium)    Start with back leaning against a wall, step feet further  forwards.  Close your eyes.  Slowly shift weight forwards through shoulders and then hips over feet.  Stabilize and then shift hips backwards slowly to wall.  Repeat __10__ times per session. Do __2__ sessions per day.  Copyright  VHI. All rights reserved.

## 2019-10-03 ENCOUNTER — Encounter: Payer: Self-pay | Admitting: Interventional Cardiology

## 2019-10-03 ENCOUNTER — Ambulatory Visit (INDEPENDENT_AMBULATORY_CARE_PROVIDER_SITE_OTHER): Payer: Medicare Other | Admitting: Interventional Cardiology

## 2019-10-03 VITALS — BP 126/82 | HR 93 | Ht <= 58 in | Wt 99.4 lb

## 2019-10-03 DIAGNOSIS — E7849 Other hyperlipidemia: Secondary | ICD-10-CM | POA: Diagnosis not present

## 2019-10-03 DIAGNOSIS — I201 Angina pectoris with documented spasm: Secondary | ICD-10-CM

## 2019-10-03 DIAGNOSIS — Z7189 Other specified counseling: Secondary | ICD-10-CM | POA: Diagnosis not present

## 2019-10-03 DIAGNOSIS — I10 Essential (primary) hypertension: Secondary | ICD-10-CM

## 2019-10-03 DIAGNOSIS — I341 Nonrheumatic mitral (valve) prolapse: Secondary | ICD-10-CM | POA: Diagnosis not present

## 2019-10-03 NOTE — Patient Instructions (Signed)
Medication Instructions:  Your physician recommends that you continue on your current medications as directed. Please refer to the Current Medication list given to you today.  *If you need a refill on your cardiac medications before your next appointment, please call your pharmacy*   Lab Work: None If you have labs (blood work) drawn today and your tests are completely normal, you will receive your results only by: . MyChart Message (if you have MyChart) OR . A paper copy in the mail If you have any lab test that is abnormal or we need to change your treatment, we will call you to review the results.   Testing/Procedures: None   Follow-Up: At [facility], you and your health needs are our priority.  As part of our continuing mission to provide you with exceptional heart care, we have created designated Provider Care Teams.  These Care Teams include your primary Cardiologist (physician) and Advanced Practice Providers (APPs -  Physician Assistants and Nurse Practitioners) who all work together to provide you with the care you need, when you need it.  We recommend signing up for the patient portal called "MyChart".  Sign up information is provided on this After Visit Summary.  MyChart is used to connect with patients for Virtual Visits (Telemedicine).  Patients are able to view lab/test results, encounter notes, upcoming appointments, etc.  Non-urgent messages can be sent to your provider as well.   To learn more about what you can do with MyChart, go to [email or web address].    Your next appointment:   12 month(s)  The format for your next appointment:   In Person  Provider:   You may see Henry W Smith III, MD or one of the following Advanced Practice Providers on your designated Care Team:    Lori Gerhardt, NP  Laura Ingold, NP  Jill McDaniel, NP    Other Instructions  Your provider recommends that you maintain 150 minutes per week of moderate aerobic activity.   

## 2019-10-04 DIAGNOSIS — F320 Major depressive disorder, single episode, mild: Secondary | ICD-10-CM | POA: Diagnosis not present

## 2019-10-05 DIAGNOSIS — Z23 Encounter for immunization: Secondary | ICD-10-CM | POA: Diagnosis not present

## 2019-10-07 DIAGNOSIS — M150 Primary generalized (osteo)arthritis: Secondary | ICD-10-CM | POA: Diagnosis not present

## 2019-10-07 DIAGNOSIS — M25551 Pain in right hip: Secondary | ICD-10-CM | POA: Diagnosis not present

## 2019-10-07 DIAGNOSIS — M810 Age-related osteoporosis without current pathological fracture: Secondary | ICD-10-CM | POA: Diagnosis not present

## 2019-10-07 DIAGNOSIS — M2550 Pain in unspecified joint: Secondary | ICD-10-CM | POA: Diagnosis not present

## 2019-10-07 DIAGNOSIS — M5030 Other cervical disc degeneration, unspecified cervical region: Secondary | ICD-10-CM | POA: Diagnosis not present

## 2019-10-07 DIAGNOSIS — Z6821 Body mass index (BMI) 21.0-21.9, adult: Secondary | ICD-10-CM | POA: Diagnosis not present

## 2019-10-08 ENCOUNTER — Encounter: Payer: Self-pay | Admitting: Family Medicine

## 2019-10-09 ENCOUNTER — Other Ambulatory Visit: Payer: Self-pay | Admitting: Internal Medicine

## 2019-10-09 ENCOUNTER — Ambulatory Visit: Payer: Medicare Other | Attending: Family Medicine

## 2019-10-09 ENCOUNTER — Other Ambulatory Visit: Payer: Self-pay

## 2019-10-09 DIAGNOSIS — R252 Cramp and spasm: Secondary | ICD-10-CM | POA: Diagnosis not present

## 2019-10-09 DIAGNOSIS — M810 Age-related osteoporosis without current pathological fracture: Secondary | ICD-10-CM

## 2019-10-09 DIAGNOSIS — R293 Abnormal posture: Secondary | ICD-10-CM | POA: Diagnosis not present

## 2019-10-09 DIAGNOSIS — M6281 Muscle weakness (generalized): Secondary | ICD-10-CM | POA: Insufficient documentation

## 2019-10-09 DIAGNOSIS — M542 Cervicalgia: Secondary | ICD-10-CM

## 2019-10-09 NOTE — Therapy (Signed)
[facility] [address] [place], [place], [address] Phone: [phone]   Fax:  [phone]  Physical Therapy Treatment  Patient Details  Name: Amanda Cannon MRN: [id number] Date of Birth: [birth year] Referring Provider (PT): Presley Raddle, MD   Encounter Date: 10/09/2019   PT End of Session - 10/09/19 1231    Visit Number 30   18 vestibular/ 12 neck   Progress Note Due on Visit 21    PT Start Time 1147    PT Stop Time 1228    PT Time Calculation (min) 41 min    Activity Tolerance Patient tolerated treatment well    Behavior During Therapy [facility] for tasks assessed/performed           Past Medical History:  Diagnosis Date  . Alcohol use   . Anemia   . Anxiety   . Asthma   . Basal cell carcinoma    forehead  . Breast cancer ([place]) 1998   left   . Colon polyps   . GERD (gastroesophageal reflux disease)   . Glaucoma   . Hyperlipidemia   . Hypertension   . Hyponatremia    remote, mild  . Mitral valve prolapse   . Osteoarthritis    sees rheumatologist  . Osteoporosis    sees rheumatologist  . Personal history of radiation therapy 1998  . PMR (polymyalgia rheumatica) (HCC)    sees rheumatologist  . Pneumonia   . Prinzmetal angina ([place])   . White matter changes    ? ischemic sm vs per pt    Past Surgical History:  Procedure Laterality Date  . ABDOMINAL HYSTERECTOMY    . BREAST LUMPECTOMY Left 1998   cancer  . CESAREAN SECTION     x 4  . COL[place]COPY  07/2012   in [place]  . HERNIA REPAIR  2005   abdominal hernia repair  . HYSTEROTOMY    . LYMPH NODE DISSECTION Left   . POLYPECTOMY      There were no vitals filed for this visit.   Subjective Assessment - 10/09/19 1153    Subjective I feel 10% better overall.  I really miss the massage.  I am getting more headaches in the past 2 months.    Pain Location Neck    Pain Descriptors / Indicators Aching;Dull    Pain Type Chronic pain    Pain  Onset More than a month ago    Pain Frequency Intermittent    Aggravating Factors  turning head, moving, heat/humidity    Pain Relieving Factors massage              [facility] PT Assessment - 10/09/19 0001      Assessment   Medical Diagnosis neck pain    Referring Provider (PT) Presley Raddle, MD      Prior Function   Level of Independence Independent      Observation/Other Assessments   Neck Disability Index  20% limitation      AROM   Cervical Flexion 40    Cervical Extension 10    Cervical - Right Side Bend 35    Cervical - Left Side Bend 25    Cervical - Right Rotation 50    Cervical - Left Rotation 50      Strength   Overall Strength Deficits    Right Shoulder Flexion 4/5    Right Shoulder ABduction 4-/5    Right Shoulder Internal Rotation 3-/5    Left Shoulder Flexion 4/5  Left Shoulder ABduction 4-/5    Left Shoulder External Rotation 4-/5                         [facility] Adult PT Treatment/Exercise - 10/09/19 0001      Manual Therapy   Manual Therapy Soft tissue mobilization;Myofascial release;Joint mobilization    Joint Mobilization PA mobs, sideglide C3-5    Soft tissue mobilization Static stretching for Bil rotation, > RT:   Soft tissue to cervical Bil: SCM, scalenes, occiput                   PT Short Term Goals - 09/06/19 1649      PT SHORT TERM GOAL #1   Title = LTG for vestibular             PT Long Term Goals - 10/09/19 1148      PT LONG TERM GOAL #5   Title be independent in advanced HEP for neck pain    Status Achieved      PT LONG TERM GOAL #6   Title reduce NDI to < or = to 10% to improve function with daily tasks    Baseline 20%    Status Partially Met      PT LONG TERM GOAL #7   Title improve postural strength to sit with neutral/upright posture > or = to 75% of the time    Status Achieved      PT LONG TERM GOAL #8   Title demonstrate cervical A/ROM rotation to > or = to 50 degrees to improve  safety with driving    Status Not Met      PT LONG TERM GOAL  #9   TITLE imrpove UE strength to > or = to 4-/5 to improve endurance for functional use    Status Partially Met                 Plan - 10/09/19 1230    Clinical Impression Statement Pt with lapse in treatment since 09/13/19.  Pt reports a 10% improvement in pain overall.  Pt with chronic and continued limited cervical A/ROM and UE strength.  Pt is independent in HEP for flexibility and mobility.  Pt with chronic tension.  Pt has been working on postural endurance and is making corrections at home.  Pt with reduced segmental mobility and increased pain with mobs today.  Pt will D/C to HEP.    PT Next Visit Plan D/C neck today.  Pt will continue with neuro for vestibular    Consulted and Agree with Plan of Care Patient           Patient will benefit from skilled therapeutic intervention in order to improve the following deficits and impairments:     Visit Diagnosis: Cervicalgia  Cramp and spasm  Abnormal posture  Muscle weakness (generalized)     Problem List Patient Active Problem List   Diagnosis Date Noted  . Vertigo 08/06/2019  . Cough 08/06/2019  . Mixed rhinitis 08/06/2019  . Gastroesophageal reflux disease 08/06/2019  . Trigeminal neuropathy 11/13/2017  . PMR (polymyalgia rheumatica) (HCC) 04/24/2017  . Hyponatremia 07/15/2015  . Hyperlipidemia 07/15/2015  . Esophageal spasm 07/15/2015  . Essential hypertension 07/15/2015  . Depression 07/15/2015  . Asthma 07/15/2015  . Tinnitus 07/15/2015  . Osteoporosis 07/15/2015  . Spasm   . Prinzmetal angina ([place])   . Mitral valve prolapse   . White matter changes    PHYSICAL  THERAPY DISCHARGE SUMMARY  Visits from Start of Care: 12  Current functional level related to goals / functional outcomes: See above for current status.   Remaining deficits: Chronic neck pain, UE weakness and limited cervical flexibility.  Pt has HEP to address.       Education / Equipment: HEP, posture Plan: Patient agrees to discharge.  Patient goals were partially met.                                                  being pleased with the current functional level.  ?????         Sigurd Sos, PT 10/09/19 12:33 PM   [facility] [address] [place], [place], [address] Phone: [phone]   Fax:  [phone]  Name: Koralee Wedeking MRN: [id number] Date of Birth: [birth year]

## 2019-10-11 ENCOUNTER — Other Ambulatory Visit: Payer: Self-pay

## 2019-10-11 ENCOUNTER — Ambulatory Visit: Payer: Medicare Other | Admitting: Physical Therapy

## 2019-10-11 ENCOUNTER — Encounter: Payer: Self-pay | Admitting: Physical Therapy

## 2019-10-11 DIAGNOSIS — R2681 Unsteadiness on feet: Secondary | ICD-10-CM | POA: Diagnosis not present

## 2019-10-11 DIAGNOSIS — R42 Dizziness and giddiness: Secondary | ICD-10-CM | POA: Diagnosis not present

## 2019-10-11 DIAGNOSIS — R2689 Other abnormalities of gait and mobility: Secondary | ICD-10-CM | POA: Diagnosis not present

## 2019-10-11 NOTE — Therapy (Signed)
[facility] [address] [place], [place], [address] Phone: [phone]   Fax:  [phone]  Physical Therapy Treatment and D/C Summary  Patient Details  Name: Amanda Cannon MRN: [id number] Date of Birth: [birth year] Referring Provider (PT): Leta Baptist, MD   Encounter Date: 10/11/2019   PT End of Session - 10/11/19 1648    Visit Number 31   19 vestibular, 12 neck   Authorization Type Medicare.    PT Start Time 1408    PT Stop Time 1447    PT Time Calculation (min) 39 min    Activity Tolerance Patient tolerated treatment well    Behavior During Therapy WFL for tasks assessed/performed           Past Medical History:  Diagnosis Date  . Alcohol use   . Anemia   . Anxiety   . Asthma   . Basal cell carcinoma    forehead  . Breast cancer ([place]) 1998   left   . Colon polyps   . GERD (gastroesophageal reflux disease)   . Glaucoma   . Hyperlipidemia   . Hypertension   . Hyponatremia    remote, mild  . Mitral valve prolapse   . Osteoarthritis    sees rheumatologist  . Osteoporosis    sees rheumatologist  . Personal history of radiation therapy 1998  . PMR (polymyalgia rheumatica) (HCC)    sees rheumatologist  . Pneumonia   . Prinzmetal angina ([place])   . White matter changes    ? ischemic sm vs per pt    Past Surgical History:  Procedure Laterality Date  . ABDOMINAL HYSTERECTOMY    . BREAST LUMPECTOMY Left 1998   cancer  . CESAREAN SECTION     x 4  . COLONOSCOPY  07/2012   in [place]  . HERNIA REPAIR  2005   abdominal hernia repair  . HYSTEROTOMY    . LYMPH NODE DISSECTION Left   . POLYPECTOMY      There were no vitals filed for this visit.   Subjective Assessment - 10/11/19 1411    Subjective Had blood work done and some levels were abnormal.  Has a follow up with PCP to discuss.  Pt still having dizziness in enclosed spaces; headache comes and goes.  Still working at [employer].  Got booster  COVID vaccine.    Pertinent History bilateral symmetric sensorineural hearing loss:  bilateral occasional tinnitus; motion sickness; glaucoma; cataract lenses; HTN: osteoporosis; arthritis; trigeminal neuropathy:  polymyalgia rheumatica:  anxiety/depression    Currently in Pain? Yes    Pain Onset More than a month ago              [facility] PT Assessment - 10/11/19 1423      Assessment   Medical Diagnosis Vertigo; Imbalance     Referring Provider (PT) Leta Baptist, MD      Observation/Other Assessments   Other Surveys  Dizziness Handicap Inventory [facility])    Dizziness Handicap Inventory [facility])  46      Ambulation/Gait   Ambulation/Gait --      Functional Gait  Assessment   Gait assessed  Yes    Gait Level Surface Walks 20 ft in less than 7 sec but greater than 5.5 sec, uses assistive device, slower speed, mild gait deviations, or deviates 6-10 in outside of the 12 in walkway width.    Change in Gait Speed Able to smoothly change walking speed without loss of balance or gait deviation.  Deviate no more than 6 in outside of the 12 in walkway width.    Gait with Horizontal Head Turns Performs head turns smoothly with slight change in gait velocity (eg, minor disruption to smooth gait path), deviates 6-10 in outside 12 in walkway width, or uses an assistive device.    Gait with Vertical Head Turns Performs task with slight change in gait velocity (eg, minor disruption to smooth gait path), deviates 6 - 10 in outside 12 in walkway width or uses assistive device    Gait and Pivot Turn Pivot turns safely within 3 sec and stops quickly with no loss of balance.    Step Over Obstacle Is able to step over 2 stacked shoe boxes taped together (9 in total height) without changing gait speed. No evidence of imbalance.    Gait with Narrow Base of Support Is able to ambulate for 10 steps heel to toe with no staggering.    Gait with Eyes Closed Walks 20 ft, slow speed, abnormal gait pattern, evidence for imbalance,  deviates 10-15 in outside 12 in walkway width. Requires more than 9 sec to ambulate 20 ft.    Ambulating Backwards Walks 20 ft, uses assistive device, slower speed, mild gait deviations, deviates 6-10 in outside 12 in walkway width.    Steps Alternating feet, no rail.    Total Score 24    FGA comment: 24/30; feet shuffling, mild dizziness with turns.  No hip pain               Vestibular Assessment - 10/11/19 1416      Positional Sensitivities   Nose to Right Knee No dizziness    Right Knee to Sitting Lightedness    Nose to Left Knee No dizziness    Left Knee to Sitting Lightheadedness    Head Turning x 5 Moderate dizziness    Head Nodding x 5 No dizziness    Pivot Right in Standing No dizziness    Pivot Left in Standing No dizziness    Positional Sensitivities Comments head turns and bending down over knee brought on most symptoms                              PT Short Term Goals - 09/06/19 1649      PT SHORT TERM GOAL #1   Title = LTG for vestibular             PT Long Term Goals - 10/11/19 1438      PT LONG TERM GOAL #1   Title Decrease DHI to <30% to indicate improvement in dizziness for improved quality of life.    Baseline 46%    Time 4    Period Weeks    Status Partially Met      PT LONG TERM GOAL #2   Title Increase FGA score to >/= 23/30 points to demo improved balance during gait for incr. safety.    Baseline 24/30    Time 4    Period Weeks    Status Achieved      PT LONG TERM GOAL #3   Title Independent in HEP for balance and vestibular exercises.    Time 4    Period Weeks    Status Achieved      PT LONG TERM GOAL #4   Title Report symptoms no greater than 1/5 on MSQ for head turns/nods, body turns, and bending down to the ground to  allow pt to be more independent with household activities    Baseline 3/5 head turns and 1/5 bending down; 0 for body turns    Time 5    Period Weeks    Status Partially Met                  Plan - 10/11/19 1649    Clinical Impression Statement Pt has made steady progress and has met 2/4 LTG, partially meeting other two.  Pt is independent with HEP and has been performing consistently.  She also demonstrates improvement in balance and decreased falls risk during dynamic gait as indicated by FGA score of 24/30; pt continues to demonstrate imbalance and falls risk when vision is removed or when performing head movement combined with ambulation.  Pt also demonstrates decreased motion sensitivity but continues to report symptoms of dizziness and pressure with repeated head movements and bending down to the floor.  Pt also reports increased function and decreased dizziness handicap.  Pt is safe and ready for D/C from vestibular rehab.    Personal Factors and Comorbidities Comorbidity 2;Past/Current Experience;Time since onset of injury/illness/exacerbation    Comorbidities trigeminal neuropathy, polymyalgia rheumatica, HTN, depression, arthritis, mitral valve prolapse; mild inferior abrasion in Rt external auditory canal per audiologist's report from 04-03-19 visit:  tinnitus, motion sickness, glaaucoma, sensorineural hearing loss, h/o breast cancer    Examination-Activity Limitations Locomotion Level;Transfers;Bend;Stairs;Squat;Stand    Examination-Participation Restrictions Meal Prep;Cleaning;Community Activity;Driving;Shop    Rehab Potential Good    PT Frequency 1x / week    PT Duration 4 weeks    PT Treatment/Interventions ADLs/Self Care Home Management;Vestibular;Gait training;Therapeutic activities;Therapeutic exercise;Balance training;Neuromuscular re-education;Patient/family education;Aquatic Therapy;Functional mobility training;Canalith Repostioning    PT Next Visit Plan D/C    PT Home Exercise Plan Access Code: [email or web address] for neck    Consulted and Agree with Plan of Care Patient           Patient will benefit from skilled therapeutic intervention in order to improve  the following deficits and impairments:  Difficulty walking, Decreased balance, Impaired vision/preception, Dizziness  Visit Diagnosis: Unsteadiness on feet  Other abnormalities of gait and mobility  Dizziness and giddiness     Problem List Patient Active Problem List   Diagnosis Date Noted  . Vertigo 08/06/2019  . Cough 08/06/2019  . Mixed rhinitis 08/06/2019  . Gastroesophageal reflux disease 08/06/2019  . Trigeminal neuropathy 11/13/2017  . PMR (polymyalgia rheumatica) (HCC) 04/24/2017  . Hyponatremia 07/15/2015  . Hyperlipidemia 07/15/2015  . Esophageal spasm 07/15/2015  . Essential hypertension 07/15/2015  . Depression 07/15/2015  . Asthma 07/15/2015  . Tinnitus 07/15/2015  . Osteoporosis 07/15/2015  . Spasm   . Prinzmetal angina ([place])   . Mitral valve prolapse   . White matter changes    PHYSICAL THERAPY DISCHARGE SUMMARY  Visits from Start of Care: 19  Current functional level related to goals / functional outcomes: See LTG achievement and impression statement above.   Remaining deficits: Motion sensitivity/dizziness with repeated head turns and reaching down to floor.  Mild imbalance   Education / Equipment: HEP  Plan: Patient agrees to discharge.  Patient goals were partially met. Patient is being discharged due to being pleased with the current functional level.  ?????      Rico Junker, PT, DPT 10/11/19    4:54 PM    [facility] [address] [place], [place], [address] Phone: [phone]   Fax:  [phone]  Name: Amanda Cannon MRN: [id number]  Date of Birth: 28-Feb-1945

## 2019-10-22 DIAGNOSIS — F320 Major depressive disorder, single episode, mild: Secondary | ICD-10-CM | POA: Diagnosis not present

## 2019-10-30 DIAGNOSIS — E871 Hypo-osmolality and hyponatremia: Secondary | ICD-10-CM | POA: Diagnosis not present

## 2019-11-03 DIAGNOSIS — Z23 Encounter for immunization: Secondary | ICD-10-CM | POA: Diagnosis not present

## 2019-11-05 DIAGNOSIS — F320 Major depressive disorder, single episode, mild: Secondary | ICD-10-CM | POA: Diagnosis not present

## 2019-11-11 DIAGNOSIS — M810 Age-related osteoporosis without current pathological fracture: Secondary | ICD-10-CM | POA: Diagnosis not present

## 2019-11-13 ENCOUNTER — Other Ambulatory Visit: Payer: Self-pay | Admitting: Allergy

## 2019-11-27 ENCOUNTER — Ambulatory Visit (INDEPENDENT_AMBULATORY_CARE_PROVIDER_SITE_OTHER): Payer: Medicare Other | Admitting: Family Medicine

## 2019-11-27 ENCOUNTER — Encounter: Payer: Self-pay | Admitting: Family Medicine

## 2019-11-27 ENCOUNTER — Other Ambulatory Visit: Payer: Self-pay

## 2019-11-27 VITALS — BP 100/60 | HR 100 | Temp 98.6°F | Ht <= 58 in | Wt 98.7 lb

## 2019-11-27 DIAGNOSIS — I10 Essential (primary) hypertension: Secondary | ICD-10-CM | POA: Diagnosis not present

## 2019-11-27 DIAGNOSIS — H832X9 Labyrinthine dysfunction, unspecified ear: Secondary | ICD-10-CM

## 2019-11-27 DIAGNOSIS — R7989 Other specified abnormal findings of blood chemistry: Secondary | ICD-10-CM

## 2019-11-27 DIAGNOSIS — R252 Cramp and spasm: Secondary | ICD-10-CM

## 2019-11-27 DIAGNOSIS — K219 Gastro-esophageal reflux disease without esophagitis: Secondary | ICD-10-CM

## 2019-11-27 DIAGNOSIS — R700 Elevated erythrocyte sedimentation rate: Secondary | ICD-10-CM | POA: Diagnosis not present

## 2019-11-27 DIAGNOSIS — M810 Age-related osteoporosis without current pathological fracture: Secondary | ICD-10-CM | POA: Diagnosis not present

## 2019-11-27 DIAGNOSIS — M542 Cervicalgia: Secondary | ICD-10-CM | POA: Diagnosis not present

## 2019-11-27 NOTE — Progress Notes (Signed)
Amanda Cannon DOB: [birth year] Encounter date: 11/27/2019  This is a 74 y.o. female who presents with No chief complaint on file.   History of present illness: Would like sodium, crp, sed rate rechecked. She has labs with her today since we don't get results from all of her specialists.   Just feeling discouraged. Getting more headaches. They aren't "bad". Dizziness, vision feels distorted. Feels like left side of head is under water. Does think some environmental affects. Just feels more constant - sometimes that left side of head is "really bad" when she taps on head feels like left side of head is under water. Dizziness started when she moved here - around 2018.   Stopped aleve for [place] to get accurate bloodwork. Does feel like she gets some benefit from [facility] and it helps to keep down inflammation which is helpful for her.   Last visit was 05/29/2019. /Allergies/asthma on 08/06/2019.  They are working on allergy control, following up on reflux control (currently using Protonix 20 mg in the morning and Pepcid 40 mg at night. Doesn't feel that this is working for her acid reflux - states that she is going to make follow up with Dr. Fuller Plan. Stomach has felt better after stopping aleve.   Hypertension/mitral valve prolapse/hyperlipidemia:follows with Dr. Tamala Julian. On Repatha.  LDL has been less than 100.  Did feel that vestibular sx improved with PT; but doesn't stay better unless she is constantly doing her neck exercises.   Hx PMR/osteoporosis/arthritis: on prolia, seeing Dr. Amil Amen. Doing well with this.  Hx Breast ca: -1998 -neg bracaper her report -s/p partial mastectomy L breast, hysterectomyand oophorectomy -after cancer, tamoxifen, LN dissection then they suggested ovaries to be removed. -Last mammogram was 08/05/2019: BI-RADS 2  Arthritis: shoulder, legs. Doesn't slow her down much. Migrates around. PMR - has occasional attacks. Has prednisone pack at home.   Eating  healthier - really working on this. Two meals per day. Most of meals at home.   HPI   Allergies  Allergen Reactions  . Tylenol [Acetaminophen] Cough  . Turmeric     Lowers BP  . Erythromycin     Stomach cramps  . Erythromycin Base Other (See Comments)    Stomach issues  . Garlic Swelling    GI upset  . Other     Beta-blockers-patient states it makes her crazy  . Sulfa Antibiotics Other (See Comments)    Doesn't recall   Current Meds  Medication Sig  . Alirocumab (PRALUENT) 150 MG/ML SOAJ Inject 1 pen into the skin every 14 (fourteen) days.  Marland Kitchen amLODipine (NORVASC) 5 MG tablet TAKE 1 TABLET BY MOUTH EVERY DAY  . aspirin 81 MG tablet Take 81 mg by mouth daily.  . cetirizine (ZYRTEC) 10 MG tablet Take 10 mg by mouth 2 (two) times daily.   Marland Kitchen CLINPRO 5000 1.1 % PSTE See admin instructions.  Marland Kitchen denosumab (PROLIA) 60 MG/ML SOLN injection Inject 60 mg into the skin every 6 (six) months. Administer in upper arm, thigh, or abdomen  . famotidine (PEPCID) 40 MG tablet Take 1 tablet (40 mg total) by mouth daily.  . Fluticasone Propionate (FLONASE NA)   . furosemide (LASIX) 20 MG tablet Take 20 mg by mouth daily.  Marland Kitchen LORazepam (ATIVAN) 1 MG tablet Take 1 mg by mouth at bedtime as needed (spasms).  Marland Kitchen MAGNESIUM PO Take 400 mg by mouth daily.   . pantoprazole (PROTONIX) 20 MG tablet TAKE 1 TABLET BY MOUTH EVERY DAY  . valsartan (DIOVAN)  160 MG tablet TAKE 1/2 TABLET BY MOUTH EVERY DAY    Review of Systems  Constitutional: Negative for chills, fatigue and fever.  Respiratory: Negative for cough, chest tightness, shortness of breath and wheezing.   Cardiovascular: Negative for chest pain, palpitations and leg swelling.  Neurological: Positive for dizziness and headaches (left sided head).    Objective:  BP 100/60 (BP Location: Right Arm, Patient Position: Sitting, Cuff Size: Normal)   Pulse 100   Temp 98.6 F (37 C) (Oral)   Ht 4\' 8"  (1.422 m)   Wt 98 lb 11.2 oz (44.8 kg)   BMI 22.13  kg/m   Weight: 98 lb 11.2 oz (44.8 kg)   BP Readings from Last 3 Encounters:  11/27/19 100/60  10/03/19 126/82  08/06/19 118/70   Wt Readings from Last 3 Encounters:  11/27/19 98 lb 11.2 oz (44.8 kg)  10/03/19 99 lb 6.4 oz (45.1 kg)  05/29/19 100 lb 8 oz (45.6 kg)    Physical Exam Constitutional:      General: She is not in acute distress.    Appearance: She is well-developed.  Eyes:     Pupils: Pupils are equal, round, and reactive to light.  Cardiovascular:     Rate and Rhythm: Normal rate and regular rhythm.     Heart sounds: Normal heart sounds. No murmur heard.  No friction rub.  Pulmonary:     Effort: Pulmonary effort is normal. No respiratory distress.     Breath sounds: Normal breath sounds. No wheezing or rales.  Musculoskeletal:     Right lower leg: No edema.     Left lower leg: No edema.  Neurological:     Mental Status: She is alert and oriented to person, place, and time.     Comments: She does have slight balance difficulty with getting up from seated position onto exam table.  Psychiatric:        Behavior: Behavior normal.     Assessment/Plan  1. Vestibular disequilibrium, unspecified laterality She has follow-up with neurology, ENT.  She is managing the symptoms.  She does feel better she does regular neck exercises, so encouraged her to do these.  2. High serum phosphorus for age We will plan to recheck blood work today.  3. Gastroesophageal reflux disease, unspecified whether esophagitis present Continue with Protonix 20 mg daily, Pepcid 40 mg daily.  4. Neck pain She feels that neck pain does contribute to headaches.  I am hopeful that source medicine will be able to perform regular osteopathic treatments to help with muscle discomforts. - Ambulatory referral to Sports Medicine  5. Essential hypertension Pressures well controlled/on low end.  Encouraged her to check regularly at home as we could potentially decrease medications.  Currently  taking amlodipine 5 mg, valsartan 160 mg.  Encouraged her to let me know if running this low regularly at home. - CBC with Differential/Platelet; Future - Comprehensive metabolic panel; Future - Comprehensive metabolic panel - CBC with Differential/Platelet  6. Osteoporosis, unspecified osteoporosis type, unspecified pathological fracture presence She is doing Prolia and following regularly with specialist for bone density monitoring.  She continues to take vitamin D. - VITAMIN D 25 Hydroxy (Vit-D Deficiency, Fractures); Future - VITAMIN D 25 Hydroxy (Vit-D Deficiency, Fractures)  7. Elevated sed rate We will plan to recheck blood work. - Sedimentation rate; Future - C-reactive protein; Future - C-reactive protein - Sedimentation rate  8. Leg cramps - Magnesium, RBC; Future - Phosphorus; Future - Phosphorus - Magnesium, RBC  Return for pending bloodwork; likely 6 month for CCV.      Micheline Rough, MD

## 2019-11-29 LAB — COMPREHENSIVE METABOLIC PANEL
AG Ratio: 1.7 (calc) (ref 1.0–2.5)
ALT: 15 U/L (ref 6–29)
AST: 29 U/L (ref 10–35)
Albumin: 4.7 g/dL (ref 3.6–5.1)
Alkaline phosphatase (APISO): 71 U/L (ref 37–153)
BUN: 24 mg/dL (ref 7–25)
CO2: 23 mmol/L (ref 20–32)
Calcium: 10.5 mg/dL — ABNORMAL HIGH (ref 8.6–10.4)
Chloride: 93 mmol/L — ABNORMAL LOW (ref 98–110)
Creat: 0.79 mg/dL (ref 0.60–0.93)
Globulin: 2.7 g/dL (calc) (ref 1.9–3.7)
Glucose, Bld: 99 mg/dL (ref 65–99)
Potassium: 4 mmol/L (ref 3.5–5.3)
Sodium: 132 mmol/L — ABNORMAL LOW (ref 135–146)
Total Bilirubin: 0.5 mg/dL (ref 0.2–1.2)
Total Protein: 7.4 g/dL (ref 6.1–8.1)

## 2019-11-29 LAB — CBC WITH DIFFERENTIAL/PLATELET
Absolute Monocytes: 886 cells/uL (ref 200–950)
Basophils Absolute: 43 cells/uL (ref 0–200)
Basophils Relative: 0.5 %
Eosinophils Absolute: 120 cells/uL (ref 15–500)
Eosinophils Relative: 1.4 %
HCT: 37.2 % (ref 35.0–45.0)
Hemoglobin: 12.6 g/dL (ref 11.7–15.5)
Lymphs Abs: 1290 cells/uL (ref 850–3900)
MCH: 33 pg (ref 27.0–33.0)
MCHC: 33.9 g/dL (ref 32.0–36.0)
MCV: 97.4 fL (ref 80.0–100.0)
MPV: 9.7 fL (ref 7.5–12.5)
Monocytes Relative: 10.3 %
Neutro Abs: 6261 cells/uL (ref 1500–7800)
Neutrophils Relative %: 72.8 %
Platelets: 307 10*3/uL (ref 140–400)
RBC: 3.82 10*6/uL (ref 3.80–5.10)
RDW: 12.7 % (ref 11.0–15.0)
Total Lymphocyte: 15 %
WBC: 8.6 10*3/uL (ref 3.8–10.8)

## 2019-11-29 LAB — C-REACTIVE PROTEIN: CRP: 4.3 mg/L (ref <8.0)

## 2019-11-29 LAB — MAGNESIUM, RBC: Magnesium RBC: 6.4 mg/dL (ref 4.0–6.4)

## 2019-11-29 LAB — SEDIMENTATION RATE: Sed Rate: 29 mm/h (ref 0–30)

## 2019-11-29 LAB — PHOSPHORUS: Phosphorus: 4.7 mg/dL — ABNORMAL HIGH (ref 2.1–4.3)

## 2019-11-29 LAB — VITAMIN D 25 HYDROXY (VIT D DEFICIENCY, FRACTURES): Vit D, 25-Hydroxy: 28 ng/mL — ABNORMAL LOW (ref 30–100)

## 2019-12-01 ENCOUNTER — Other Ambulatory Visit: Payer: Self-pay | Admitting: Family Medicine

## 2019-12-02 ENCOUNTER — Encounter: Payer: Self-pay | Admitting: Family Medicine

## 2019-12-04 ENCOUNTER — Other Ambulatory Visit: Payer: Self-pay

## 2019-12-04 ENCOUNTER — Other Ambulatory Visit: Payer: Medicare Other

## 2019-12-05 LAB — PTH, INTACT AND CALCIUM
Calcium: 10.3 mg/dL (ref 8.6–10.4)
PTH: 44 pg/mL (ref 14–64)

## 2019-12-11 NOTE — Addendum Note (Signed)
Addended by: Agnes Lawrence on: 12/11/2019 03:24 PM   Modules accepted: Orders

## 2019-12-25 ENCOUNTER — Telehealth: Payer: Self-pay | Admitting: Pharmacist

## 2019-12-25 MED ORDER — PRALUENT 150 MG/ML ~~LOC~~ SOAJ: 1.0000 "pen " | SUBCUTANEOUS | 3 refills | Status: DC

## 2019-12-25 NOTE — Telephone Encounter (Signed)
Pt called clinic inquiring about Praluent patient assistance for next year. She's currently enrolled in the [employer] and her application expires on 47/8/41. They are currently closed for re-enrollment due to lack of funding. Her household income is too high for the [employer] PASS program. She is aware she'd need to pay through her deductible and then would have a ~$50 copay per month for Praluent next year. Did call [facility] to see if they can push through a 3 month refill in the next few days so that she can receive an additional 3 months of medication at no cost. Her last fill date was 10/21/19. Pharmacy stated they were able to process rx for $0 copay. Pt is aware and was very appreciative.

## 2020-01-01 ENCOUNTER — Other Ambulatory Visit: Payer: Self-pay

## 2020-01-01 ENCOUNTER — Ambulatory Visit (INDEPENDENT_AMBULATORY_CARE_PROVIDER_SITE_OTHER): Payer: Medicare Other | Admitting: Family Medicine

## 2020-01-01 ENCOUNTER — Encounter: Payer: Self-pay | Admitting: Family Medicine

## 2020-01-01 ENCOUNTER — Ambulatory Visit (INDEPENDENT_AMBULATORY_CARE_PROVIDER_SITE_OTHER): Payer: Medicare Other

## 2020-01-01 VITALS — BP 122/80 | HR 88 | Ht <= 58 in | Wt 97.0 lb

## 2020-01-01 DIAGNOSIS — I201 Angina pectoris with documented spasm: Secondary | ICD-10-CM

## 2020-01-01 DIAGNOSIS — M542 Cervicalgia: Secondary | ICD-10-CM

## 2020-01-01 DIAGNOSIS — G4486 Cervicogenic headache: Secondary | ICD-10-CM

## 2020-01-01 DIAGNOSIS — M810 Age-related osteoporosis without current pathological fracture: Secondary | ICD-10-CM | POA: Diagnosis not present

## 2020-01-01 DIAGNOSIS — M353 Polymyalgia rheumatica: Secondary | ICD-10-CM

## 2020-01-01 DIAGNOSIS — R42 Dizziness and giddiness: Secondary | ICD-10-CM

## 2020-01-01 DIAGNOSIS — M5030 Other cervical disc degeneration, unspecified cervical region: Secondary | ICD-10-CM

## 2020-01-01 DIAGNOSIS — I776 Arteritis, unspecified: Secondary | ICD-10-CM

## 2020-01-01 DIAGNOSIS — E871 Hypo-osmolality and hyponatremia: Secondary | ICD-10-CM

## 2020-01-01 NOTE — Assessment & Plan Note (Signed)
Does have osteoporosis.  Bone density has been ordered for January.  Patient is being followed by her rheumatologist.  I did encourage vitamin D even with her mild low phosphorus I do feel it would be beneficial

## 2020-01-01 NOTE — Assessment & Plan Note (Signed)
Seems stable and chronic

## 2020-01-01 NOTE — Assessment & Plan Note (Signed)
Stable sedimentation rate and CRP normal in November

## 2020-01-01 NOTE — Assessment & Plan Note (Addendum)
DDD on xray  Concern though for possible vascular in nature with history of the polymyalgia rheumatica.  Patient did recently go have a regular sedimentation rate and CRP.  I do believe the neck is playing a fairly large role in medicine possibly causing cervicogenic headaches.  Patient does state that it seems to sometimes go up her face as well as going into her ear.  Patient has seen multiple different specialist for this including ENT and neurology.  Patient's last imaging of the brain was in 2019 and showed some mild white matter changes but otherwise fairly unremarkable.  Due to the potential vascular nature of this I do feel getting a CT head would be beneficial as well as with the mild weakness we noted of the left upper extremity in the C8 distribution.  I would like that an MRI of the neck and an MR angiogram of the neck to further evaluate the vascular aspect of this.  This could help [place] rule out things such as fibrous dysplasia or any type of other vascular pathology.  Patient has done physical therapy and given some other home exercises at this time. F/u again 4 weeks.   When patient was here we only discussed the potential x-rays and was going to further evaluate the chart.  Attempted to call patient already to tell her the rest of the plan.  Did leave a message for her to call back.  Discussed with patient primary care provider as well.  She agreed with this plan.  Did discuss with patient if any worsening symptoms such as a weakness, headaches, visual changes or any type of speech impediment or other signs of stroke to call 911 immediately.  Another addendum: Patient did call back and we did discuss plan with her.  Order CT head, MRI brain with and without contrast MRI cervical with and without contrast ordered

## 2020-01-01 NOTE — Patient Instructions (Addendum)
Good to see you  Neck xray on way out Scapular exercises given Tart cherry extract 1200mg  at night Vitamin D 2000 IU daily  Ice instead of heat Will look through chart to see if we need a different type of MRI See me again in 5-6 weeks but will be intouch before then.

## 2020-01-01 NOTE — Progress Notes (Addendum)
[name] [place] Phone: [phone] Subjective:   Amanda Cannon, am serving as a scribe for Dr. Hulan Saas. This visit occurred during the SARS-CoV-2 public health emergency.  Safety protocols were in place, including screening questions prior to the visit, additional usage of staff PPE, and extensive cleaning of exam room while observing appropriate contact time as indicated for disinfecting solutions.   I'm seeing this patient by the request  of:  Amanda Macadam, MD  CC: Neck pain   [id number]  Amanda Cannon is a 74 y.o. female coming in with complaint of Neck pain patient not in terrible pain so does not know how to really describe what is going on in the area she is having issues. Patient is very concerned about her vestibular issues (she does already go to rehab for) and that she has headaches. Patient states that she does not feel in anyway that she is is pain that is not tolerable. Patient locates pain to the Left side of neck and base of skull that has been going on for years, walking and laying causes more pain, and has tried dry needling that has helped.  Patient states that this has been going on for years but seems to be anything worsening.  Patient has had work-up for this previously.  Patient has seen multiple different providers.  Patient has work-up by ENT and neurology previously.  Patient also sees rheumatology for her polymyalgia rheumatica.  At this time everyone feels like she is relatively stable.  Patient feels like she has not made the improvement she would expect.  Was told to come here to be further evaluated for her which the neck could be contributing.   Patient has had an MRI brain with and without contrast in 2019 showing mild generalized cortical atrophy and mild chronic microvascular ischemic changes  Patient is accompanied with her husband today.  Past Medical History:  Diagnosis  Date  . Alcohol use   . Anemia   . Anxiety   . Asthma   . Basal cell carcinoma    forehead  . Breast cancer ([place]) 1998   left   . Colon polyps   . GERD (gastroesophageal reflux disease)   . Glaucoma   . Hyperlipidemia   . Hypertension   . Hyponatremia    remote, mild  . Mitral valve prolapse   . Osteoarthritis    sees rheumatologist  . Osteoporosis    sees rheumatologist  . Personal history of radiation therapy 1998  . PMR (polymyalgia rheumatica) (HCC)    sees rheumatologist  . Pneumonia   . Prinzmetal angina ([place])   . White matter changes    ? ischemic sm vs per pt   Past Surgical History:  Procedure Laterality Date  . ABDOMINAL HYSTERECTOMY    . BREAST LUMPECTOMY Left 1998   cancer  . CESAREAN SECTION     x 4  . COLONOSCOPY  07/2012   in [place]  . HERNIA REPAIR  2005   abdominal hernia repair  . HYSTEROTOMY    . LYMPH NODE DISSECTION Left   . POLYPECTOMY     Social History   Socioeconomic History  . Marital status: Married    Spouse name: Zenia Resides   . Number of children: 3  . Years of education: 67  . Highest education level: Some college, no degree  Occupational History  . Occupation: Psychologist, occupational  Tobacco Use  . Smoking status: Former  Smoker    Packs/day: 1.00    Years: 7.00    Pack years: 7.00    Quit date: 01/25/1971    Years since quitting: 48.9  . Smokeless tobacco: Never Used  Vaping Use  . Vaping Use: Never used  Substance and Sexual Activity  . Alcohol use: Yes    Alcohol/week: 14.0 standard drinks    Types: 14 Standard drinks or equivalent per week    Comment: two drinks with burbon + water daily   . Drug use: No  . Sexual activity: Yes  Other Topics Concern  . 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 Difficulty of Paying Living Expenses: Not hard at all  Food Insecurity: No Food Insecurity  . Worried About Charity fundraiser in the Last Year: Never true  . Ran Out of Food in the Last Year: Never true  Transportation Needs: No Transportation Needs  . Lack of Transportation (Medical): No  . Lack of Transportation (Non-Medical): No  Physical Activity: Insufficiently Active  . Days of Exercise per Week: 3 days  . Minutes of Exercise per Session: 20 min  Stress:   . Feeling of Stress : Not on file  Social Connections: Socially Integrated  . Frequency of Communication with Friends and Family: More than three times a week  . Frequency of Social Gatherings with Friends and Family: Once a week  . Attends Religious Services: More than 4 times per year  . Active Member of Clubs or Organizations: Yes  . Attends Archivist Meetings: More than 4 times per year  . Marital Status: Married   Allergies  Allergen Reactions  . Tylenol [Acetaminophen] Cough  . Turmeric     Lowers BP  . Erythromycin     Stomach cramps  . Erythromycin Base Other (See Comments)    Stomach issues  . Garlic Swelling    GI upset  . Other     Beta-blockers-patient states it makes her crazy  . Sulfa Antibiotics Other (See Comments)    Doesn't recall   Family History  Problem Relation Age of Onset  . Coronary artery disease Mother 94  . Coronary artery disease Father 12  . Cancer - Ovarian Sister   . Colon cancer Cousin        x 2  . Esophageal cancer Neg Hx   . Rectal cancer Neg Hx   . Stomach cancer Neg Hx     Current Outpatient Medications (Endocrine & Metabolic):  .  denosumab (PROLIA) 60 MG/ML SOLN injection, Inject 60 mg into the skin every 6 (six) months. Administer in upper arm, thigh, or abdomen  Current Outpatient Medications (Cardiovascular):  Marland Kitchen  Alirocumab (PRALUENT) 150 MG/ML SOAJ, Inject 1 pen into the  skin every 14 (fourteen) days. Marland Kitchen  amLODipine (NORVASC) 5 MG tablet, TAKE 1 TABLET BY MOUTH EVERY DAY .  furosemide (LASIX) 20 MG tablet, Take 20 mg by mouth daily. .  valsartan (DIOVAN) 160 MG tablet, TAKE 1/2 TABLET BY MOUTH EVERY DAY  Current Outpatient Medications (Respiratory):  .  cetirizine (ZYRTEC) 10 MG  tablet, Take 10 mg by mouth 2 (two) times daily.  .  Fluticasone Propionate (FLONASE NA),   Current Outpatient Medications (Analgesics):  .  aspirin 81 MG tablet, Take 81 mg by mouth daily.   Current Outpatient Medications (Other):  Marland Kitchen  CLINPRO 5000 1.1 % PSTE, See admin instructions. .  famotidine (PEPCID) 40 MG tablet, Take 1 tablet (40 mg total) by mouth daily. Marland Kitchen  LORazepam (ATIVAN) 1 MG tablet, Take 1 mg by mouth at bedtime as needed (spasms). Marland Kitchen  MAGNESIUM PO, Take 400 mg by mouth daily.  .  pantoprazole (PROTONIX) 20 MG tablet, TAKE 1 TABLET BY MOUTH EVERY DAY   Reviewed prior external information including notes and imaging from  primary care provider As well as notes that were available from care everywhere and other healthcare systems.  Past medical history, social, surgical and family history all reviewed in electronic medical record.  No pertanent information unless stated regarding to the chief complaint.   Review of Systems:  No nausea, vomiting,  constipation, abdominal pain, skin rash, fevers, chills, night sweats,  swollen lymph nodes,  chest pain, shortness of breath, mood changes. POSITIVE muscle aches, body aches, headache, intermittent visual changes, dizziness intermittent diarrhea and difficulty gaining weight   Objective  Blood pressure 122/80, pulse 88, height 4\' 8"  (1.422 m), weight 97 lb (44 kg), SpO2 98 %.   General: No apparent distress alert and oriented x3 mood and affect normal, dressed appropriately.  Patient does have some difficulty with movement memorization it appears low. HEENT: Pupils equal, extraocular movements intact  Respiratory:  Patient's speak in full sentences and does not appear short of breath  Patient's neck exam does have some mild loss of lordosis.  Worsening pain with extension of the neck.  With some mild dizziness that seems with kind of compression of the vascularity of the vertebral artery potentially.  No problem with the carotids.  Patient does have limited sidebending of the neck especially to the left. Left hand exam does show some mild weakness compared to the contralateral side in the C8 distribution.  Patient does have some arthritic changes.  No pronator drift noted.  Cranial nerves II through XII do appear to be intact. Deep tendon reflexes including bicep and tricep are intact.    Impression and Recommendations:     The above documentation has been reviewed and is accurate and complete Lyndal Pulley, DO Total time with patient, talking to other providers, and reviewing other imaging and labs greater than 65 minutes today.

## 2020-01-02 ENCOUNTER — Other Ambulatory Visit: Payer: Self-pay | Admitting: Family Medicine

## 2020-01-02 DIAGNOSIS — R42 Dizziness and giddiness: Secondary | ICD-10-CM

## 2020-01-03 ENCOUNTER — Other Ambulatory Visit: Payer: Self-pay

## 2020-01-03 ENCOUNTER — Telehealth: Payer: Self-pay | Admitting: *Deleted

## 2020-01-03 DIAGNOSIS — R42 Dizziness and giddiness: Secondary | ICD-10-CM

## 2020-01-03 NOTE — Telephone Encounter (Signed)
MRI changed. Patient notified via [facility].

## 2020-01-03 NOTE — Telephone Encounter (Signed)
Yes I think with and without would be helpful if possible

## 2020-01-03 NOTE — Telephone Encounter (Signed)
Spoke to pt, she would like clarification on the MRI/CT orders that were ordered recently. More specifically, she believes that the MRI brain should be with and without contrast based on what her & Dr. Tamala Julian had discussed previously.

## 2020-01-07 ENCOUNTER — Other Ambulatory Visit: Payer: Self-pay | Admitting: Family Medicine

## 2020-01-09 ENCOUNTER — Ambulatory Visit
Admission: RE | Admit: 2020-01-09 | Discharge: 2020-01-09 | Disposition: A | Discharge status: Home / Self Care | Payer: Medicare Other | Source: Ambulatory Visit | Attending: Family Medicine | Admitting: Family Medicine

## 2020-01-09 ENCOUNTER — Encounter: Payer: Self-pay | Admitting: Family Medicine

## 2020-01-09 DIAGNOSIS — S0990XA Unspecified injury of head, initial encounter: Secondary | ICD-10-CM | POA: Diagnosis not present

## 2020-01-09 DIAGNOSIS — R42 Dizziness and giddiness: Secondary | ICD-10-CM | POA: Diagnosis not present

## 2020-01-13 ENCOUNTER — Telehealth: Payer: Self-pay | Admitting: Family Medicine

## 2020-01-13 NOTE — Telephone Encounter (Signed)
Spoke with patient.  On another line and will call back to  schedule Medicare Annual Wellness Visit (AWV) either virtually or in office.   Last AWV 01/22/19  please schedule at anytime with [facility] Nurse Health Advisor 1 or 2   This should be a 45 minute visit.

## 2020-01-22 ENCOUNTER — Other Ambulatory Visit: Payer: Self-pay | Admitting: Pharmacist

## 2020-01-22 ENCOUNTER — Ambulatory Visit
Admission: RE | Admit: 2020-01-22 | Discharge: 2020-01-22 | Disposition: A | Discharge status: Home / Self Care | Payer: Medicare Other | Source: Ambulatory Visit | Attending: Family Medicine | Admitting: Family Medicine

## 2020-01-22 ENCOUNTER — Encounter: Payer: Self-pay | Admitting: Family Medicine

## 2020-01-22 DIAGNOSIS — M4802 Spinal stenosis, cervical region: Secondary | ICD-10-CM | POA: Diagnosis not present

## 2020-01-22 DIAGNOSIS — R42 Dizziness and giddiness: Secondary | ICD-10-CM

## 2020-01-22 DIAGNOSIS — I776 Arteritis, unspecified: Secondary | ICD-10-CM | POA: Diagnosis not present

## 2020-01-22 DIAGNOSIS — M47812 Spondylosis without myelopathy or radiculopathy, cervical region: Secondary | ICD-10-CM | POA: Diagnosis not present

## 2020-01-22 DIAGNOSIS — M5021 Other cervical disc displacement,  high cervical region: Secondary | ICD-10-CM | POA: Diagnosis not present

## 2020-01-22 DIAGNOSIS — M5030 Other cervical disc degeneration, unspecified cervical region: Secondary | ICD-10-CM

## 2020-01-22 DIAGNOSIS — M4319 Spondylolisthesis, multiple sites in spine: Secondary | ICD-10-CM | POA: Diagnosis not present

## 2020-01-22 MED ORDER — GADOBENATE DIMEGLUMINE 529 MG/ML IV SOLN
9.0000 mL | Freq: Once | INTRAVENOUS | Status: AC | PRN
  Administered 2020-01-22: 14:00:00 9 mL via INTRAVENOUS

## 2020-01-22 MED ORDER — PRALUENT 150 MG/ML ~~LOC~~ SOAJ: 1.0000 "pen " | SUBCUTANEOUS | 11 refills | Status: DC

## 2020-01-27 ENCOUNTER — Telehealth: Payer: Self-pay | Admitting: Pharmacist

## 2020-01-27 ENCOUNTER — Other Ambulatory Visit: Payer: Medicare Other

## 2020-01-27 MED ORDER — PRALUENT 150 MG/ML ~~LOC~~ SOAJ: 1.0000 "pen " | SUBCUTANEOUS | 3 refills | Status: DC

## 2020-01-27 NOTE — Telephone Encounter (Signed)
Called pt to re-enroll her in [facility] since it has reopened for renewals.  Will MyChart message pt the below message to bring to her pharmacy the next time she needs Praluent refilled so that she can continue to receive it for free. Pt was very appreciative for assistance.  Pharmacy Card ID [id number]  Group [id number] PCN [id number]  BIN [id number]

## 2020-02-03 ENCOUNTER — Other Ambulatory Visit: Payer: Medicare Other

## 2020-02-04 ENCOUNTER — Other Ambulatory Visit: Payer: Medicare Other

## 2020-02-06 ENCOUNTER — Ambulatory Visit: Payer: Medicare Other | Admitting: Allergy & Immunology

## 2020-02-07 DIAGNOSIS — E559 Vitamin D deficiency, unspecified: Secondary | ICD-10-CM | POA: Diagnosis not present

## 2020-02-07 DIAGNOSIS — N182 Chronic kidney disease, stage 2 (mild): Secondary | ICD-10-CM | POA: Diagnosis not present

## 2020-02-09 ENCOUNTER — Other Ambulatory Visit: Payer: Self-pay | Admitting: Allergy & Immunology

## 2020-02-11 ENCOUNTER — Encounter: Payer: Self-pay | Admitting: Family Medicine

## 2020-02-11 ENCOUNTER — Other Ambulatory Visit: Payer: Self-pay

## 2020-02-11 ENCOUNTER — Ambulatory Visit (INDEPENDENT_AMBULATORY_CARE_PROVIDER_SITE_OTHER): Payer: Medicare Other | Admitting: Family Medicine

## 2020-02-11 ENCOUNTER — Ambulatory Visit: Payer: Medicare Other | Admitting: Family Medicine

## 2020-02-11 DIAGNOSIS — M353 Polymyalgia rheumatica: Secondary | ICD-10-CM

## 2020-02-11 DIAGNOSIS — R42 Dizziness and giddiness: Secondary | ICD-10-CM | POA: Diagnosis not present

## 2020-02-11 DIAGNOSIS — E871 Hypo-osmolality and hyponatremia: Secondary | ICD-10-CM | POA: Diagnosis not present

## 2020-02-11 DIAGNOSIS — M5030 Other cervical disc degeneration, unspecified cervical region: Secondary | ICD-10-CM | POA: Diagnosis not present

## 2020-02-11 NOTE — Progress Notes (Signed)
[facility] [address] [place] Phone: [phone] Subjective:   I Amanda Cannon am serving as a Education administrator for Dr. Hulan Saas.  This visit occurred during the SARS-CoV-2 public health emergency.  Safety protocols were in place, including screening questions prior to the visit, additional usage of staff PPE, and extensive cleaning of exam room while observing appropriate contact time as indicated for disinfecting solutions.   I'm seeing this patient by the request  of:  [name], MD  CC: Neck pain follow-up  [id number]   01/01/2020 DDD on xray  Concern though for possible vascular in nature with history of the polymyalgia rheumatica.  Patient did recently go have a regular sedimentation rate and CRP.  I do believe the neck is playing a fairly large role in medicine possibly causing cervicogenic headaches.  Patient does state that it seems to sometimes go up her face as well as going into her ear.  Patient has seen multiple different specialist for this including ENT and neurology.  Patient's last imaging of the brain was in 2019 and showed some mild white matter changes but otherwise fairly unremarkable.  Due to the potential vascular nature of this I do feel getting a CT head would be beneficial as well as with the mild weakness we noted of the left upper extremity in the C8 distribution.  I would like that an MRI of the neck and an MR angiogram of the neck to further evaluate the vascular aspect of this.  This could help [place] rule out things such as fibrous dysplasia or any type of other vascular pathology.  Patient has done physical therapy and given some other home exercises at this time. F/u again 4 weeks.   When patient was here we only discussed the potential x-rays and was going to further evaluate the chart.  Attempted to call patient already to tell her the rest of the plan.  Did leave a message for her to call back.  Discussed  with patient primary care provider as well.  She agreed with this plan.  Did discuss with patient if any worsening symptoms such as a weakness, headaches, visual changes or any type of speech impediment or other signs of stroke to call 911 immediately.  Another addendum: Patient did call back and we did discuss plan with her.  Order CT head, MRI brain with and without contrast MRI cervical with and without contrast ordered  02/01/2020 Amanda Cannon is a 75 y.o. female coming in with complaint of neck pain. Patient states she has good and bad days. Doing ok right now.  Patient still states that does have the pain in the neck area is still on the left side.  Can radiate up to the left side of her head.  Still has some difficulty especially in certain light causes her to feel unstable.  Patient is going to see an eye doctor in the near future.    Patient did have advanced imaging.  Patient had an MRI of the brain with and without contrast that did show scattered white matter changes more reflective of mild chronic microvascular ischemia and patient's MR angiogram of the neck was fairly unremarkable  Cervical MRI though did show that patient had moderate bilateral C3-C4 and right C5-C6 foraminal foraminal narrowing and mild spinal canal stenosis noted from C3-C6.  Patient has already undergone physical therapy for the neck which patient states did not have any significant help.  Past Medical  History:  Diagnosis Date  . Alcohol use   . Anemia   . Anxiety   . Asthma   . Basal cell carcinoma    forehead  . Breast cancer ([place]) 1998   left   . Colon polyps   . GERD (gastroesophageal reflux disease)   . Glaucoma   . Hyperlipidemia   . Hypertension   . Hyponatremia    remote, mild  . Mitral valve prolapse   . Osteoarthritis    sees rheumatologist  . Osteoporosis    sees rheumatologist  . Personal history of radiation therapy 1998  . PMR (polymyalgia rheumatica) (HCC)    sees  rheumatologist  . Pneumonia   . Prinzmetal angina ([place])   . White matter changes    ? ischemic sm vs per pt   Past Surgical History:  Procedure Laterality Date  . ABDOMINAL HYSTERECTOMY    . BREAST LUMPECTOMY Left 1998   cancer  . CESAREAN SECTION     x 4  . COLONOSCOPY  07/2012   in [place]  . HERNIA REPAIR  2005   abdominal hernia repair  . HYSTEROTOMY    . LYMPH NODE DISSECTION Left   . POLYPECTOMY     Social History   Socioeconomic History  . Marital status: Married    Spouse name: Zenia Resides   . Number of children: 3  . Years of education: 32  . Highest education level: Some college, no degree  Occupational History  . Occupation: Psychologist, occupational  Tobacco Use  . Smoking status: Former Smoker    Packs/day: 1.00    Years: 7.00    Pack years: 7.00    Quit date: 01/25/1971    Years since quitting: 49.0  . Smokeless tobacco: Never Used  Vaping Use  . Vaping Use: Never used  Substance and Sexual Activity  . Alcohol use: Yes    Alcohol/week: 14.0 standard drinks    Types: 14 Standard drinks or equivalent per week    Comment: two drinks with burbon + water daily   . Drug use: No  . Sexual activity: Yes  Other Topics Concern  . 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 Tylenol [Acetaminophen] Cough  . Turmeric     Lowers BP  . Erythromycin     Stomach cramps  . Erythromycin Base Other (See Comments)    Stomach issues  . Garlic Swelling    GI upset  . Other      Beta-blockers-patient states it makes her crazy  . Sulfa Antibiotics Other (See Comments)    Doesn't recall   Family History  Problem Relation Age of Onset  . Coronary artery disease Mother 4  . Coronary artery disease Father 38  . Cancer - Ovarian Sister   . Colon cancer Cousin        x 2  . Esophageal cancer Neg Hx   .  Rectal cancer Neg Hx   . Stomach cancer Neg Hx     Current Outpatient Medications (Endocrine & Metabolic):  .  denosumab (PROLIA) 60 MG/ML SOLN injection, Inject 60 mg into the skin every 6 (six) months. Administer in upper arm, thigh, or abdomen  Current Outpatient Medications (Cardiovascular):  Marland Kitchen  Alirocumab (PRALUENT) 150 MG/ML SOAJ, Inject 1 pen into the skin every 14 (fourteen) days. Marland Kitchen  amLODipine (NORVASC) 5 MG tablet, TAKE 1 TABLET BY MOUTH EVERY DAY .  furosemide (LASIX) 20 MG tablet, Take 20 mg by mouth daily. .  valsartan (DIOVAN) 160 MG tablet, TAKE 1/2 TABLET BY MOUTH EVERY DAY  Current Outpatient Medications (Respiratory):  .  cetirizine (ZYRTEC) 10 MG tablet, Take 10 mg by mouth 2 (two) times daily.  .  Fluticasone Propionate (FLONASE NA),   Current Outpatient Medications (Analgesics):  .  aspirin 81 MG tablet, Take 81 mg by mouth daily.   Current Outpatient Medications (Other):  Marland Kitchen  CLINPRO 5000 1.1 % PSTE, See admin instructions. .  famotidine (PEPCID) 40 MG tablet, Take 1 tablet (40 mg total) by mouth daily. Marland Kitchen  LORazepam (ATIVAN) 1 MG tablet, Take 1 mg by mouth at bedtime as needed (spasms). Marland Kitchen  MAGNESIUM PO, Take 400 mg by mouth daily.  .  pantoprazole (PROTONIX) 20 MG tablet, TAKE 1 TABLET BY MOUTH EVERY DAY   Reviewed prior external information including notes and imaging from  primary care provider As well as notes that were available from care everywhere and other healthcare systems.  Past medical history, social, surgical and family history all reviewed in electronic medical record.  No pertanent information unless stated regarding  to the chief complaint.   Review of Systems:  Nonausea, vomiting, diarrhea, constipation, dizziness, abdominal pain, skin rash, fevers, chills, night sweats, weight loss, swollen lymph nodes, body aches, joint swelling, chest pain, shortness of breath, mood changes. POSITIVE muscle aches, headache, visual changes  Objective  Blood pressure 130/70, pulse 87, height 4\' 8"  (1.422 m), SpO2 99 %.   General: No apparent distress alert and oriented x3 mood and affect normal, dressed appropriately.  Patient seems to be more aware than previous exam.  Patient is accompanied with significant other HEENT: Pupils equal, extraocular movements intact today.  No nystagmus noted. Respiratory: Patient's speak in full sentences and does not appear short of breath  Cardiovascular: No lower extremity edema, non tender, no erythema  Neck exam does have some loss of lordosis.  Tenderness to palpation mostly on the left side of the neck mostly at C3-C4 with what appears to be a bony protuberance in the area.  Patient has a negative Spurling's.  Patient does state some mild increase in discomfort with extension of the neck though.  4+ out of 5 strength but symmetric of the upper extremities bilaterally.    Impression and Recommendations:     The above documentation has been reviewed and is accurate and complete Lyndal Pulley, DO

## 2020-02-11 NOTE — Assessment & Plan Note (Signed)
Patient does have some degenerative disc disease of the cervical spine.  I do think that there is a possibility for some cervicogenic headaches.  We discussed the potential referral to a headache specialist with neurology which patient declined at this moment.  Discussed with patient about icing regimen and other things such as potential injections which patient declined.  We discussed possible repeating physical therapy which patient also declined.  Patient states that this might be just something she has to live with.  I did discuss some of patient's other comorbidities including the hyponatremia could be contributing and she is following up with her nephrologist in the future.  No change in medications today.  We discussed at this point that if she did change her mind on the referral for vestibular neuro training or for neurology she just needs to reach out but otherwise I am here if she has any other questions.

## 2020-02-11 NOTE — Patient Instructions (Addendum)
Good to see you I am glad you are doing a little better Continue exercises Follow up with Dr. Posey Pronto for low sodium My other recommendations would be seeing Dr. Tomi Likens or vestibular neuro therapy I think seeing the eye doctor is a good idea IF you change your mind you can write me See me when you need me

## 2020-02-11 NOTE — Assessment & Plan Note (Signed)
Following up with nephrology.  Could be contributing to some of this.

## 2020-02-11 NOTE — Assessment & Plan Note (Signed)
Patient has had a history for this but feels like she is stable at the moment and follows up with rheumatology.

## 2020-02-11 NOTE — Assessment & Plan Note (Signed)
Still noted on most recent MRI in 2021 consistent with more of chronic microvascular ischemia discussed referral to neurology which patient declined but will consider talking to her primary care provider

## 2020-02-11 NOTE — Assessment & Plan Note (Signed)
Work-up was fairly unremarkable.  Patient does not want to go to vestibular neuro training at this moment.  Patient has had some mild trigeminal neuropathy as well and we discussed the possibility of referral to neurology which patient declined again.  Do not want to add other medications secondary to some of the confusion and off-balance that patient feels.  Patient will follow-up with primary care and her other providers including a eye doctor and nephrologist.

## 2020-02-13 ENCOUNTER — Encounter: Payer: Self-pay | Admitting: Allergy & Immunology

## 2020-02-13 ENCOUNTER — Ambulatory Visit (INDEPENDENT_AMBULATORY_CARE_PROVIDER_SITE_OTHER): Payer: Medicare Other | Admitting: Allergy & Immunology

## 2020-02-13 ENCOUNTER — Other Ambulatory Visit: Payer: Self-pay

## 2020-02-13 VITALS — BP 120/70 | HR 85 | Temp 97.9°F | Resp 16 | Ht <= 58 in | Wt 99.8 lb

## 2020-02-13 DIAGNOSIS — E871 Hypo-osmolality and hyponatremia: Secondary | ICD-10-CM | POA: Diagnosis not present

## 2020-02-13 DIAGNOSIS — R059 Cough, unspecified: Secondary | ICD-10-CM | POA: Diagnosis not present

## 2020-02-13 DIAGNOSIS — R42 Dizziness and giddiness: Secondary | ICD-10-CM

## 2020-02-13 DIAGNOSIS — I129 Hypertensive chronic kidney disease with stage 1 through stage 4 chronic kidney disease, or unspecified chronic kidney disease: Secondary | ICD-10-CM | POA: Diagnosis not present

## 2020-02-13 DIAGNOSIS — K219 Gastro-esophageal reflux disease without esophagitis: Secondary | ICD-10-CM | POA: Diagnosis not present

## 2020-02-13 DIAGNOSIS — J310 Chronic rhinitis: Secondary | ICD-10-CM

## 2020-02-13 DIAGNOSIS — N182 Chronic kidney disease, stage 2 (mild): Secondary | ICD-10-CM | POA: Diagnosis not present

## 2020-02-13 DIAGNOSIS — N2581 Secondary hyperparathyroidism of renal origin: Secondary | ICD-10-CM | POA: Diagnosis not present

## 2020-02-13 NOTE — Patient Instructions (Addendum)
1. Cough with postnasal drip (sensitization to molds) - Continue with cetrizine 10mg  twice daily.  - Continue with the vestibular rehabilitation.  - Continue with azelastine one spray per nostril nightly.  - Talk to Dr. Benjamine Mola about your shortness of breath with inspiration to see if you have vocal cord dysfunction.   2. Gastroesophageal reflux disease - You can stop the pantoprazole 40mg  to see if there is any worsening of symptoms.  - Continue with famotidine 40mg  at night.   3. Return in about 6 months (around 08/12/2020).   Please inform us of any Emergency Department visits, hospitalizations, or changes in symptoms. Call us before going to the ED for breathing or allergy symptoms since we might be able to fit you in for a sick visit. Feel free to contact us anytime with any questions, problems, or concerns.  It was a pleasure to see you again today!  Websites that have reliable patient information: 1. American Academy of Asthma, Allergy, and Immunology: www.aaaai.org 2. Food Allergy Research and Education (FARE): foodallergy.org 3. Mothers of Asthmatics: http://www.asthmacommunitynetwork.org 4. American College of Allergy, Asthma, and Immunology: www.acaai.org   COVID-19 Vaccine Information can be found at: ShippingScam.co.uk For questions related to vaccine distribution or appointments, please email vaccine@[email or web address] .[email or web address] or call [phone].     "Like" [place] on Facebook and Instagram for our latest updates!       Make sure you are registered to vote! If you have moved or changed any of your contact information, you will need to get this updated before voting!  In some cases, you MAY be able to register to vote online: [email or web address]

## 2020-02-14 ENCOUNTER — Ambulatory Visit
Admission: RE | Admit: 2020-02-14 | Discharge: 2020-02-14 | Disposition: A | Discharge status: Home / Self Care | Payer: Medicare Other | Source: Ambulatory Visit | Attending: Internal Medicine | Admitting: Internal Medicine

## 2020-02-14 ENCOUNTER — Encounter: Payer: Self-pay | Admitting: Allergy & Immunology

## 2020-02-14 DIAGNOSIS — M810 Age-related osteoporosis without current pathological fracture: Secondary | ICD-10-CM | POA: Diagnosis not present

## 2020-02-14 DIAGNOSIS — Z780 Asymptomatic menopausal state: Secondary | ICD-10-CM | POA: Diagnosis not present

## 2020-02-14 DIAGNOSIS — M8588 Other specified disorders of bone density and structure, other site: Secondary | ICD-10-CM | POA: Diagnosis not present

## 2020-02-14 NOTE — Progress Notes (Signed)
FOLLOW UP  Date of Service/Encounter:  02/14/20   Assessment:   Cough  Chronic rhinitis (molds) - consider intradermal testing in the future  Gastroesophageal reflux disease  Vertigo - followed by the vestibular rehab program with Dr. Benjamine Mola  Plan/Recommendations:   1. Cough with postnasal drip (sensitization to molds) - Continue with cetrizine 10mg  twice daily.  - Continue with the vestibular rehabilitation.  - Continue with azelastine one spray per nostril nightly.  - Talk to Dr. Benjamine Mola about your shortness of breath with inspiration to see if you have vocal cord dysfunction.   2. Gastroesophageal reflux disease - You can stop the pantoprazole 40mg  to see if there is any worsening of symptoms.  - Continue with famotidine 40mg  at night.   3. Return in about 6 months (around 08/12/2020).   Subjective:   Amanda Cannon is a 75 y.o. female presenting today for follow up of  Chief Complaint  Patient presents with   Dizziness    Same as before.   Cough    Occasional Post nasal drip    Amanda Cannon has a history of the following: Patient Active Problem List   Diagnosis Date Noted   Degenerative cervical disc 01/01/2020   Vertigo 08/06/2019   Cough 08/06/2019   Mixed rhinitis 08/06/2019   Gastroesophageal reflux disease 08/06/2019   Trigeminal neuropathy 11/13/2017   PMR (polymyalgia rheumatica) ([place]) 04/24/2017   Hyponatremia 07/15/2015   Hyperlipidemia 07/15/2015   Esophageal spasm 07/15/2015   Essential hypertension 07/15/2015   Depression 07/15/2015   Asthma 07/15/2015   Tinnitus 07/15/2015   Osteoporosis 07/15/2015   Spasm    Prinzmetal angina (HCC)    Mitral valve prolapse    White matter changes     History obtained from: chart review and patient.  Amanda Cannon is a 75 y.o. female presenting for a follow up visit.  She is a delightful female with a history of chronic rhinitis as well as reflux and vertigo presenting for a  follow-up visit.  At the last visit, which was in July 2021, we continue with cetirizine twice daily as well as vestibular rehabilitation.  We added on azelastine nasal spray 1 spray per nostril nightly.  We also continued with famotidine as well as pantoprazole.  In the interim, she has done very well.  She is here today for regular follow-up visit, although it is typical for her, she is very social.  She does not have much to say about her symptoms.  She does use the Astelin as well as the 1-2 times a day antihistamine and everything seems to be under fairly good control.  She will have some cough in her throat.  She points to her at the top of her lungs. She tells me that she sometimes has to cough to clear up some mucous, but it is "not that bad". She is fairly happy with how well she is doing.  She remains on her reflux medication.  She is open to stopping 1 or more of them.  She does not see a gastroenterologist.  We prescribed her with the pantoprazole and the famotidine.  She continues to see Dr. Benjamine Mola for treatment of her vertigo.  This seems to be going fairly well.  She is happy with how she is feeling from that perspective.  She does report that she does not have the occasional difficulty with breathing in.  This is early on in inspiration.  She has not been diagnosed with vocal cord dysfunction.  This  is certainly not a routine symptom and it is few and far between.  She actually has a paid job at the [employer] downtown.  She was working in the [employer], but she now has a different job of a sure it sounds like.  She has enjoyed this immensely.  She is fully boosted to the COVID-19 vaccine.  She is willing to get as many shots as they need her to.  She also has osteoporosis and is on Prolia for this.  She is a Fish farm manager from them.  Otherwise, there have been no changes to her past medical history, surgical history, family history, or social history.    Review of  Systems  Constitutional: Negative.  Negative for chills, fever, malaise/fatigue and weight loss.  HENT: Negative for congestion, ear discharge, ear pain and sinus pain.   Eyes: Negative for pain, discharge and redness.  Respiratory: Positive for cough. Negative for sputum production, shortness of breath and wheezing.   Cardiovascular: Negative.  Negative for chest pain and palpitations.  Gastrointestinal: Negative for abdominal pain, constipation, diarrhea, heartburn, nausea and vomiting.  Skin: Negative.  Negative for itching and rash.  Neurological: Negative for dizziness and headaches.  Endo/Heme/Allergies: Positive for environmental allergies. Does not bruise/bleed easily.       Objective:   Blood pressure 120/70, pulse 85, temperature 97.9 F (36.6 C), resp. rate 16, height 4' 7.75" (1.416 m), weight 99 lb 12.8 oz (45.3 kg), SpO2 100 %. Body mass index is 22.58 kg/m.   Physical Exam:  Physical Exam Constitutional:      Appearance: She is well-developed.  HENT:     Head: Normocephalic and atraumatic.     Right Ear: Tympanic membrane, ear canal and external ear normal.     Left Ear: Tympanic membrane and ear canal normal.     Nose: No nasal deformity, septal deviation, mucosal edema, rhinorrhea or epistaxis.     Right Sinus: No maxillary sinus tenderness or frontal sinus tenderness.     Left Sinus: No maxillary sinus tenderness or frontal sinus tenderness.     Mouth/Throat:     Mouth: Oropharynx is clear and moist. Mucous membranes are not pale and not dry.     Pharynx: Uvula midline.  Eyes:     General:        Right eye: No discharge.        Left eye: No discharge.     Extraocular Movements: EOM normal.     Conjunctiva/sclera: Conjunctivae normal.     Right eye: Right conjunctiva is not injected. No chemosis.    Left eye: Left conjunctiva is not injected. No chemosis.    Pupils: Pupils are equal, round, and reactive to light.  Cardiovascular:     Rate and Rhythm:  Normal rate and regular rhythm.     Heart sounds: Normal heart sounds.  Pulmonary:     Effort: Pulmonary effort is normal. No tachypnea, accessory muscle usage or respiratory distress.     Breath sounds: Normal breath sounds. No wheezing, rhonchi or rales.  Chest:     Chest wall: No tenderness.  Lymphadenopathy:     Cervical: No cervical adenopathy.  Skin:    Coloration: Skin is not pale.     Findings: No abrasion, erythema, petechiae or rash. Rash is not papular, urticarial or vesicular.  Neurological:     Mental Status: She is alert.  Psychiatric:        Mood and Affect: Mood and affect normal.  Diagnostic studies: none     Salvatore Marvel, MD  [facility] [place] [facility]

## 2020-02-25 ENCOUNTER — Encounter: Payer: Self-pay | Admitting: Family Medicine

## 2020-02-26 DIAGNOSIS — H838X3 Other specified diseases of inner ear, bilateral: Secondary | ICD-10-CM | POA: Diagnosis not present

## 2020-02-26 DIAGNOSIS — H6123 Impacted cerumen, bilateral: Secondary | ICD-10-CM | POA: Diagnosis not present

## 2020-02-26 DIAGNOSIS — H903 Sensorineural hearing loss, bilateral: Secondary | ICD-10-CM | POA: Diagnosis not present

## 2020-02-26 DIAGNOSIS — R42 Dizziness and giddiness: Secondary | ICD-10-CM | POA: Diagnosis not present

## 2020-02-28 NOTE — Progress Notes (Signed)
Subjective:   Ivonne Freeburg is a 75 y.o. female who presents for Medicare Annual (Subsequent) preventive examination.  Review of Systems    N/A  Cardiac Risk Factors include: advanced age (>9mn, >>91women);hypertension;dyslipidemia     Objective:    Today's Vitals   03/02/20 1047  BP: 110/70  Pulse: 82  Temp: 98.1 F (36.7 C)  TempSrc: Oral  SpO2: 99%  Weight: 99 lb 9 oz (45.2 kg)  Height: '4\' 8"'  (1.422 m)   Body mass index is 22.32 kg/m.  Advanced Directives 03/02/2020 05/10/2019 01/22/2019 11/09/2017 11/08/2016  Does Patient Have a Medical Advance Directive? Yes No Yes Yes No  Type of AParamedicof [place] will - [place] will - -  Does patient want to make changes to medical advance directive? No - Patient declined - No - Patient declined - -  Copy of [place] Chart? Yes - validated most recent copy scanned in chart (See row information) - No - copy requested - -  Would patient like information on creating a medical advance directive? - No - Patient declined - - -    Current Medications (verified) Outpatient Encounter Medications as of 03/02/2020  Medication Sig  . Alirocumab (PRALUENT) 150 MG/ML SOAJ Inject 1 pen into the skin every 14 (fourteen) days.  .Marland KitchenamLODipine (NORVASC) 5 MG tablet TAKE 1 TABLET BY MOUTH EVERY DAY  . aspirin 81 MG tablet Take 81 mg by mouth daily.  . cetirizine (ZYRTEC) 10 MG tablet Take 10 mg by mouth 2 (two) times daily.   .Marland Kitchendenosumab (PROLIA) 60 MG/ML SOLN injection Inject 60 mg into the skin every 6 (six) months. Administer in upper arm, thigh, or abdomen  . furosemide (LASIX) 20 MG tablet Take 20 mg by mouth daily.  .Marland KitchenLORazepam (ATIVAN) 1 MG tablet Take 1 mg by mouth at bedtime as needed (spasms).  .Marland KitchenMAGNESIUM PO Take 400 mg by mouth daily.   . valsartan (DIOVAN) 160 MG tablet TAKE 1/2 TABLET BY MOUTH EVERY DAY  . VITAMIN D PO Take by mouth.  .Marland KitchenCLINPRO 5000  1.1 % PSTE See admin instructions. (Patient not taking: Reported on 03/02/2020)  . famotidine (PEPCID) 40 MG tablet Take 1 tablet (40 mg total) by mouth daily. (Patient not taking: Reported on 03/02/2020)  . Fluticasone Propionate (FLONASE NA)  (Patient not taking: Reported on 03/02/2020)  . pantoprazole (PROTONIX) 20 MG tablet TAKE 1 TABLET BY MOUTH EVERY DAY (Patient not taking: Reported on 03/02/2020)   No facility-administered encounter medications on file as of 03/02/2020.    Allergies (verified) Tylenol [acetaminophen], Turmeric, Erythromycin, Erythromycin base, Other, and Sulfa antibiotics   History: Past Medical History:  Diagnosis Date  . Alcohol use   . Anemia   . Anxiety   . Asthma   . Basal cell carcinoma    forehead  . Breast cancer ([place] 1998   left   . Colon polyps   . GERD (gastroesophageal reflux disease)   . Glaucoma   . Hyperlipidemia   . Hypertension   . Hyponatremia    remote, mild  . Mitral valve prolapse   . Osteoarthritis    sees rheumatologist  . Osteoporosis    sees rheumatologist  . Personal history of radiation therapy 1998  . PMR (polymyalgia rheumatica) ([facility])    sees rheumatologist  . Pneumonia   . [place]   . White matter changes    ? ischemic sm vs  per pt   Past Surgical History:  Procedure Laterality Date  . ABDOMINAL HYSTERECTOMY    . BREAST LUMPECTOMY Left 1998   cancer  . CESAREAN SECTION     x 4  . COLONOSCOPY  07/2012   in [place]  . HERNIA REPAIR  2005   abdominal hernia repair  . HYSTEROTOMY    . LYMPH NODE DISSECTION Left   . POLYPECTOMY     Family History  Problem Relation Age of Onset  . Coronary artery disease Mother 32  . Coronary artery disease Father 64  . Cancer - Ovarian Sister   . Colon cancer Cousin        x 2  . Esophageal cancer Neg Hx   . Rectal cancer Neg Hx   . Stomach cancer Neg Hx    Social History   Socioeconomic History  . Marital status: Married    Spouse name: Zenia Resides   . Number of  children: 3  . Years of education: 59  . Highest education level: Some college, no degree  Occupational History  . Occupation: Psychologist, occupational  Tobacco Use  . Smoking status: Former Smoker    Packs/day: 1.00    Years: 7.00    Pack years: 7.00    Quit date: 01/25/1971    Years since quitting: 49.1  . Smokeless tobacco: Never Used  Vaping Use  . Vaping Use: Never used  Substance and Sexual Activity  . Alcohol use: Yes    Alcohol/week: 14.0 standard drinks    Types: 14 Standard drinks or equivalent per week    Comment: two drinks with burbon + water daily   . Drug use: No  . Sexual activity: Yes  Other Topics Concern  . 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 Difficulty of Paying Living Expenses: Not hard at all  Food Insecurity: No Food Insecurity  . Worried About Charity fundraiser in the Last Year: Never true  . Ran Out of Food in the Last Year: Never true  Transportation Needs: No Transportation Needs  . Lack of Transportation (Medical): No  . Lack of Transportation (Non-Medical): No  Physical Activity: Inactive  . Days of Exercise per Week: 0 days  . Minutes of Exercise per Session: 0 min  Stress: No Stress Concern Present  . Feeling of Stress : Not at all  Social Connections: Socially Integrated  . Frequency of Communication with Friends and Family: More than three times a week  . Frequency of Social Gatherings with Friends and Family: Once a week  . Attends Religious Services: More than 4 times per year  . Active Member of Clubs or Organizations: Yes  . Attends Archivist Meetings: More than 4 times per year  . Marital Status: Married    Tobacco  Counseling Counseling given: Not Answered   Clinical Intake:  Pre-visit preparation completed: Yes  Pain : No/denies pain     Nutritional Risks: Other (Comment) (constipation) Diabetes: No  How often do you need to have someone help you when you read instructions, pamphlets, or other written materials from  your doctor or pharmacy?: 4 - Often What is the last grade level you completed in school?: Some College  Diabetic? No  Interpreter Needed?: No  Information entered by :: Show Low of Daily Living In your present state of health, do you have any difficulty performing the following activities: 03/02/2020  Hearing? Y  Vision? Y  Comment has vestibular issue  Difficulty concentrating or making decisions? N  Walking or climbing stairs? N  Dressing or bathing? N  Doing errands, shopping? N  Preparing Food and eating ? N  Using the Toilet? N  In the past six months, have you accidently leaked urine? N  Do you have problems with loss of bowel control? N  Managing your Medications? N  Managing your Finances? N  Housekeeping or managing your Housekeeping? N  Some recent data might be hidden    Patient Care Team: [name], MD as PCP - General (Family Medicine) Belva Crome, MD as PCP - Cardiology (Cardiology)  Indicate any recent Medical Services you may have received from other than [facility] providers in the past year (date may be approximate).     Assessment:   This is a routine wellness examination for Ahmia.  Hearing/Vision screen  Hearing Screening   '125Hz'  '250Hz'  '500Hz'  '1000Hz'  '2000Hz'  '3000Hz'  '4000Hz'  '6000Hz'  '8000Hz'   Right ear:           Left ear:           Vision Screening Comments: Patient states gets eyes examined every 6 months due to increased optic pressure. Has hx of bilateral cataract extraction   Dietary issues and exercise activities discussed: Current Exercise Habits: The patient does not participate in regular exercise at present,  Exercise limited by: None identified  Goals    . Patient Stated     Patient would like to make social connections. She is having a difficult time finding volunteer opportunities during the pandemic.      Depression Screen PHQ 2/9 Scores 03/02/2020 01/22/2019 11/09/2017 05/15/2017 11/08/2016  PHQ - 2 Score 0 2 0 2 0  PHQ- 9 Score - 5 - 9 -    Fall Risk Fall Risk  03/02/2020 01/22/2019 11/09/2017 05/15/2017 11/08/2016  Falls in the past year? 0 0 No No No  Comment - - states she has had balancre issues; working on tai chi - -  Number falls in past yr: 0 - - - -  Injury with Fall? 0 - - - -  Risk for fall due to : No Fall Risks Medication side effect - - -  Follow up Falls evaluation completed;Falls prevention discussed Falls evaluation completed;Education provided;Falls prevention discussed - - -    FALL RISK PREVENTION PERTAINING TO THE HOME:  Any stairs in or around the home? Yes  If so, are there any without handrails? No  Home free of loose throw rugs in walkways, pet beds, electrical cords, etc? Yes  Adequate lighting in your home to reduce risk of falls? Yes   ASSISTIVE DEVICES UTILIZED TO PREVENT FALLS:  Life alert? No  Use of a cane, walker or w/c? No  Grab bars in the bathroom? Yes  Shower chair or bench in shower? Yes  Elevated toilet seat or a handicapped toilet? No   TIMED UP AND GO:  Was the test performed? Yes .  Length of time to ambulate 10 feet: 6 sec.   Gait slow and steady without use of assistive device  Cognitive Function: Normal cognitive status assessed  by direct observation by this Nurse Health Advisor. No abnormalities found.   MMSE - Mini Mental State Exam 11/09/2017 11/08/2016  Not completed: (No Data) (No Data)     6CIT Screen 01/22/2019  What Year? 0 points  What month? 0 points  What time? 0 points  Count back from 20 0 points  Months in reverse 0 points  Repeat phrase 0 points  Total Score 0    Immunizations Immunization History   Administered Date(s) Administered  . Fluad Quad(high Dose 65+) 10/11/2018  . Influenza, High Dose Seasonal PF 11/15/2016, 11/09/2017  . Influenza-Unspecified 10/25/2015, 11/03/2019  . PFIZER(Purple Top)SARS-COV-2 Vaccination 02/08/2019, 02/28/2019, 10/05/2019  . Pneumococcal Conjugate-13 07/24/2013    TDAP status: Due, Education has been provided regarding the importance of this vaccine. Advised may receive this vaccine at local pharmacy or Health Dept. Aware to provide a copy of the vaccination record if obtained from local pharmacy or Health Dept. Verbalized acceptance and understanding.  Flu Vaccine status: Up to date  Pneumococcal vaccine status: Up to date  Covid-19 vaccine status: Completed vaccines  Qualifies for Shingles Vaccine? Yes   Zostavax completed No   Shingrix Completed?: No.    Education has been provided regarding the importance of this vaccine. Patient has been advised to call insurance company to determine out of pocket expense if they have not yet received this vaccine. Advised may also receive vaccine at local pharmacy or Health Dept. Verbalized acceptance and understanding.  Screening Tests Health Maintenance  Topic Date Due  . TETANUS/TDAP  Never done  . COVID-19 Vaccine (4 - Booster for Pfizer series) 04/03/2020  . MAMMOGRAM  07/31/2020  . DEXA SCAN  02/13/2022  . COLONOSCOPY (Pts 45-46yr Insurance coverage will need to be confirmed)  09/28/2022  . INFLUENZA VACCINE  Completed  . Hepatitis C Screening  Completed  . PNA vac Low Risk Adult  Completed    Health Maintenance  Health Maintenance Due  Topic Date Due  . TETANUS/TDAP  Never done    Colorectal cancer screening: Type of screening: Colonoscopy. Completed 09/27/2017. Repeat every 5 years  Mammogram status: Completed 08/01/2019. Repeat every year  Bone Density status: Completed 02/14/2020. Results reflect: Bone density results: OSTEOPOROSIS. Repeat every 2 years.  Lung Cancer Screening:  (Low Dose CT Chest recommended if Age 76-80years, 30 pack-year currently smoking OR have quit w/in 15years.) does not qualify.   Lung Cancer Screening Referral: N/A   Additional Screening:  Hepatitis C Screening: does qualify; Completed 03/12/2018  Vision Screening: Recommended annual ophthalmology exams for early detection of glaucoma and other disorders of the eye. Is the patient up to date with their annual eye exam?  Yes  Who is the provider or what is the name of the office in which the patient attends annual eye exams? Dr. BValetta Close If pt is not established with a provider, would they like to be referred to a provider to establish care? No .   Dental Screening: Recommended annual dental exams for proper oral hygiene  Community Resource Referral / Chronic Care Management: CRR required this visit?  No   CCM required this visit?  No      Plan:     I have personally reviewed and noted the following in the patient's chart:   . Medical and social history . Use of alcohol, tobacco or illicit drugs  . Current medications and supplements . Functional ability and status . Nutritional status . Physical activity . Advanced directives . List of other  physicians . Hospitalizations, surgeries, and ER visits in previous 12 months . Vitals . Screenings to include cognitive, depression, and falls . Referrals and appointments  In addition, I have reviewed and discussed with patient certain preventive protocols, quality metrics, and best practice recommendations. A written personalized care plan for preventive services as well as general preventive health recommendations were provided to patient.     Ofilia Neas, LPN   05/02/6159   Nurse Notes: None

## 2020-03-02 ENCOUNTER — Ambulatory Visit (INDEPENDENT_AMBULATORY_CARE_PROVIDER_SITE_OTHER): Payer: Medicare Other

## 2020-03-02 ENCOUNTER — Other Ambulatory Visit: Payer: Self-pay

## 2020-03-02 ENCOUNTER — Other Ambulatory Visit: Payer: Self-pay | Admitting: Family Medicine

## 2020-03-02 VITALS — BP 110/70 | HR 82 | Temp 98.1°F | Ht <= 58 in | Wt 99.6 lb

## 2020-03-02 DIAGNOSIS — Z0000 Encounter for general adult medical examination without abnormal findings: Secondary | ICD-10-CM | POA: Diagnosis not present

## 2020-03-02 DIAGNOSIS — H0014 Chalazion left upper eyelid: Secondary | ICD-10-CM | POA: Diagnosis not present

## 2020-03-02 DIAGNOSIS — H40053 Ocular hypertension, bilateral: Secondary | ICD-10-CM | POA: Diagnosis not present

## 2020-03-02 NOTE — Patient Instructions (Signed)
Amanda Cannon , Thank you for taking time to come for your Medicare Wellness Visit. I appreciate your ongoing commitment to your health goals. Please review the following plan we discussed and let me know if I can assist you in the future.   Screening recommendations/referrals: Colonoscopy: Up to date, next due 09/28/2022 Mammogram: Up to date, next due 07/31/2020 Bone Density: Up to date, next due 02/13/2022 Recommended yearly ophthalmology/optometry visit for glaucoma screening and checkup Recommended yearly dental visit for hygiene and checkup  Vaccinations: Influenza vaccine: Up to date, next due fall 2022  Pneumococcal vaccine: Completed series  Tdap vaccine: Currently due, you may await and injury to receive as it will be covered at that time. Shingles vaccine: Currently due for Shingrix, if you wish to receive we recommend that you do so at your local pharmacy as it is less expensive.    Advanced directives: Copies on file   Conditions/risks identified: Recommend that you increase your physical exercise to  at least 30 minutes 3x per week   Next appointment: 06/23/2020 @ 10:30 am with Dr. Ethlyn Gallery    Preventive Care 76 Years and Older, Female Preventive care refers to lifestyle choices and visits with your health care provider that can promote health and wellness. What does preventive care include?  A yearly physical exam. This is also called an annual well check.  Dental exams once or twice a year.  Routine eye exams. Ask your health care provider how often you should have your eyes checked.  Personal lifestyle choices, including:  Daily care of your teeth and gums.  Regular physical activity.  Eating a healthy diet.  Avoiding tobacco and drug use.  Limiting alcohol use.  Practicing safe sex.  Taking low-dose aspirin every day.  Taking vitamin and mineral supplements as recommended by your health care provider. What happens during an annual well check? The  services and screenings done by your health care provider during your annual well check will depend on your age, overall health, lifestyle risk factors, and family history of disease. Counseling  Your health care provider may ask you questions about your:  Alcohol use.  Tobacco use.  Drug use.  Emotional well-being.  Home and relationship well-being.  Sexual activity.  Eating habits.  History of falls.  Memory and ability to understand (cognition).  Work and work Statistician.  Reproductive health. Screening  You may have the following tests or measurements:  Height, weight, and BMI.  Blood pressure.  Lipid and cholesterol levels. These may be checked every 5 years, or more frequently if you are over 51 years old.  Skin check.  Lung cancer screening. You may have this screening every year starting at age 76 if you have a 30-pack-year history of smoking and currently smoke or have quit within the past 15 years.  Fecal occult blood test (FOBT) of the stool. You may have this test every year starting at age 76.  Flexible sigmoidoscopy or colonoscopy. You may have a sigmoidoscopy every 5 years or a colonoscopy every 10 years starting at age 76.  Hepatitis C blood test.  Hepatitis B blood test.  Sexually transmitted disease (STD) testing.  Diabetes screening. This is done by checking your blood sugar (glucose) after you have not eaten for a while (fasting). You may have this done every 1-3 years.  Bone density scan. This is done to screen for osteoporosis. You may have this done starting at age 76.  Mammogram. This may be done every 1-2 years.  Talk to your health care provider about how often you should have regular mammograms. Talk with your health care provider about your test results, treatment options, and if necessary, the need for more tests. Vaccines  Your health care provider may recommend certain vaccines, such as:  Influenza vaccine. This is recommended  every year.  Tetanus, diphtheria, and acellular pertussis (Tdap, Td) vaccine. You may need a Td booster every 10 years.  Zoster vaccine. You may need this after age 76.  Pneumococcal 13-valent conjugate (PCV13) vaccine. One dose is recommended after age 76.  Pneumococcal polysaccharide (PPSV23) vaccine. One dose is recommended after age 76. Talk to your health care provider about which screenings and vaccines you need and how often you need them. This information is not intended to replace advice given to you by your health care provider. Make sure you discuss any questions you have with your health care provider. Document Released: 02/06/2015 Document Revised: 09/30/2015 Document Reviewed: 11/11/2014 Elsevier Interactive Patient Education  2017 [place] Prevention in the Home Falls can cause injuries. They can happen to people of all ages. There are many things you can do to make your home safe and to help prevent falls. What can I do on the outside of my home?  Regularly fix the edges of walkways and driveways and fix any cracks.  Remove anything that might make you trip as you walk through a door, such as a raised step or threshold.  Trim any bushes or trees on the path to your home.  Use bright outdoor lighting.  Clear any walking paths of anything that might make someone trip, such as rocks or tools.  Regularly check to see if handrails are loose or broken. Make sure that both sides of any steps have handrails.  Any raised decks and porches should have guardrails on the edges.  Have any leaves, snow, or ice cleared regularly.  Use sand or salt on walking paths during winter.  Clean up any spills in your garage right away. This includes oil or grease spills. What can I do in the bathroom?  Use night lights.  Install grab bars by the toilet and in the tub and shower. Do not use towel bars as grab bars.  Use non-skid mats or decals in the tub or shower.  If  you need to sit down in the shower, use a plastic, non-slip stool.  Keep the floor dry. Clean up any water that spills on the floor as soon as it happens.  Remove soap buildup in the tub or shower regularly.  Attach bath mats securely with double-sided non-slip rug tape.  Do not have throw rugs and other things on the floor that can make you trip. What can I do in the bedroom?  Use night lights.  Make sure that you have a light by your bed that is easy to reach.  Do not use any sheets or blankets that are too big for your bed. They should not hang down onto the floor.  Have a firm chair that has side arms. You can use this for support while you get dressed.  Do not have throw rugs and other things on the floor that can make you trip. What can I do in the kitchen?  Clean up any spills right away.  Avoid walking on wet floors.  Keep items that you use a lot in easy-to-reach places.  If you need to reach something above you, use a strong step stool  that has a grab bar.  Keep electrical cords out of the way.  Do not use floor polish or wax that makes floors slippery. If you must use wax, use non-skid floor wax.  Do not have throw rugs and other things on the floor that can make you trip. What can I do with my stairs?  Do not leave any items on the stairs.  Make sure that there are handrails on both sides of the stairs and use them. Fix handrails that are broken or loose. Make sure that handrails are as long as the stairways.  Check any carpeting to make sure that it is firmly attached to the stairs. Fix any carpet that is loose or worn.  Avoid having throw rugs at the top or bottom of the stairs. If you do have throw rugs, attach them to the floor with carpet tape.  Make sure that you have a light switch at the top of the stairs and the bottom of the stairs. If you do not have them, ask someone to add them for you. What else can I do to help prevent falls?  Wear shoes  that:  Do not have high heels.  Have rubber bottoms.  Are comfortable and fit you well.  Are closed at the toe. Do not wear sandals.  If you use a stepladder:  Make sure that it is fully opened. Do not climb a closed stepladder.  Make sure that both sides of the stepladder are locked into place.  Ask someone to hold it for you, if possible.  Clearly mark and make sure that you can see:  Any grab bars or handrails.  First and last steps.  Where the edge of each step is.  Use tools that help you move around (mobility aids) if they are needed. These include:  Canes.  Walkers.  Scooters.  Crutches.  Turn on the lights when you go into a dark area. Replace any light bulbs as soon as they burn out.  Set up your furniture so you have a clear path. Avoid moving your furniture around.  If any of your floors are uneven, fix them.  If there are any pets around you, be aware of where they are.  Review your medicines with your doctor. Some medicines can make you feel dizzy. This can increase your chance of falling. Ask your doctor what other things that you can do to help prevent falls. This information is not intended to replace advice given to you by your health care provider. Make sure you discuss any questions you have with your health care provider. Document Released: 11/06/2008 Document Revised: 06/18/2015 Document Reviewed: 02/14/2014 Elsevier Interactive Patient Education  2017 [employer].

## 2020-03-11 ENCOUNTER — Encounter: Payer: Self-pay | Admitting: Family Medicine

## 2020-03-16 ENCOUNTER — Other Ambulatory Visit (INDEPENDENT_AMBULATORY_CARE_PROVIDER_SITE_OTHER): Payer: Medicare Other

## 2020-03-16 ENCOUNTER — Other Ambulatory Visit: Payer: Self-pay

## 2020-03-16 ENCOUNTER — Encounter: Payer: Self-pay | Admitting: Family Medicine

## 2020-03-16 LAB — COMPREHENSIVE METABOLIC PANEL
ALT: 17 U/L (ref 0–35)
AST: 27 U/L (ref 0–37)
Albumin: 4.6 g/dL (ref 3.5–5.2)
Alkaline Phosphatase: 60 U/L (ref 39–117)
BUN: 13 mg/dL (ref 6–23)
CO2: 25 mEq/L (ref 19–32)
Calcium: 10.2 mg/dL (ref 8.4–10.5)
Chloride: 90 mEq/L — ABNORMAL LOW (ref 96–112)
Creatinine, Ser: 0.77 mg/dL (ref 0.40–1.20)
GFR: 75.48 mL/min (ref >60.00)
Glucose, Bld: 93 mg/dL (ref 70–99)
Potassium: 4.4 mEq/L (ref 3.5–5.1)
Sodium: 127 mEq/L — ABNORMAL LOW (ref 135–145)
Total Bilirubin: 0.7 mg/dL (ref 0.2–1.2)
Total Protein: 7.5 g/dL (ref 6.0–8.3)

## 2020-03-16 LAB — PHOSPHORUS: Phosphorus: 4 mg/dL (ref 2.3–4.6)

## 2020-03-16 LAB — C-REACTIVE PROTEIN: CRP: <1 mg/dL (ref 0.5–20.0)

## 2020-03-18 ENCOUNTER — Telehealth: Payer: Medicare Other | Admitting: Physician Assistant

## 2020-03-18 DIAGNOSIS — E871 Hypo-osmolality and hyponatremia: Secondary | ICD-10-CM

## 2020-03-19 DIAGNOSIS — F320 Major depressive disorder, single episode, mild: Secondary | ICD-10-CM | POA: Diagnosis not present

## 2020-03-19 NOTE — Telephone Encounter (Signed)
Basic metabolic panel in 7 to 10 days.

## 2020-03-19 NOTE — Telephone Encounter (Signed)
Okay to hold valsartan.  Blood pressure will elevate so therefore monitoring with home recordings is necessary.  If significant trend towards 140/80 or greater, please notify us.  Please check a basic

## 2020-03-20 ENCOUNTER — Telehealth (INDEPENDENT_AMBULATORY_CARE_PROVIDER_SITE_OTHER): Payer: Medicare Other | Admitting: Family Medicine

## 2020-03-20 DIAGNOSIS — R410 Disorientation, unspecified: Secondary | ICD-10-CM | POA: Diagnosis not present

## 2020-03-20 DIAGNOSIS — E871 Hypo-osmolality and hyponatremia: Secondary | ICD-10-CM | POA: Diagnosis not present

## 2020-03-20 DIAGNOSIS — Z7289 Other problems related to lifestyle: Secondary | ICD-10-CM | POA: Diagnosis not present

## 2020-03-20 DIAGNOSIS — Z789 Other specified health status: Secondary | ICD-10-CM

## 2020-03-20 NOTE — Progress Notes (Signed)
Virtual Visit via Video Note  I connected with Amanda Cannon  on 03/20/20 at  1:20 PM EST by a video enabled telemedicine application and verified that I am speaking with the correct person using two identifiers.  Location patient: home, [place] Location provider:work or home office Persons participating in the virtual visit: patient, provider  I discussed the limitations of evaluation and management by telemedicine and the availability of in person appointments. The patient expressed understanding and agreed to proceed.   HPI:  Acute telemedicine visit for concerns about her sodium: - has hyponatremia and reports recently her sodium was lower on labs and when it was low she had some confusion - "felt lost" -seeing a specialist for a vestibular disorder, also had an MRI with her neurologist - gets some dizziness from this as well -she is working with Dr. Posey Pronto, her nephrologist, and with this recent low sodium spell she reports he told her she needs to restrict her fluids, cut back on alcohol and consider stopping losartan and doing a different BP medication -Dr. Tamala Julian, her cardiologist, is working with her on adjusting the BP medication - they stopped the losartan and he will see her in 2 weeks -these recommendations were news to her and this is a lot for her to process -she reports she drink 2 small drinks of alcohol a day with ice and water and she struggles with the idea of giving it up and changing her medicaions -she reports she has had the sodium issue for a long time and it has been this low before and did not need to make these dramatic changes -she wanted to talk with someone who would listen to her and could not see PCP today -is doing ok now but she wonders if she should see her neurologist about the episode as well   ROS: See pertinent positives and negatives per HPI.  Past Medical History:  Diagnosis Date  . Alcohol use   . Anemia   . Anxiety   . Asthma   . Basal cell carcinoma     forehead  . Breast cancer ([facility]) 1998   left   . Colon polyps   . GERD (gastroesophageal reflux disease)   . Glaucoma   . Hyperlipidemia   . Hypertension   . Hyponatremia    remote, mild  . Mitral valve prolapse   . Osteoarthritis    sees rheumatologist  . Osteoporosis    sees rheumatologist  . Personal history of radiation therapy 1998  . PMR (polymyalgia rheumatica) ([facility])    sees rheumatologist  . Pneumonia   . Prinzmetal angina ([facility])   . White matter changes    ? ischemic sm vs per pt    Past Surgical History:  Procedure Laterality Date  . ABDOMINAL HYSTERECTOMY    . BREAST LUMPECTOMY Left 1998   cancer  . CESAREAN SECTION     x 4  . COLONOSCOPY  07/2012   in [place]  . HERNIA REPAIR  2005   abdominal hernia repair  . HYSTEROTOMY    . LYMPH NODE DISSECTION Left   . POLYPECTOMY       Current Outpatient Medications:  .  Alirocumab (PRALUENT) 150 MG/ML SOAJ, Inject 1 pen into the skin every 14 (fourteen) days., Disp: 6 mL, Rfl: 3 .  amLODipine (NORVASC) 5 MG tablet, TAKE 1 TABLET BY MOUTH EVERY DAY, Disp: 90 tablet, Rfl: 1 .  aspirin 81 MG tablet, Take 81 mg by mouth daily., Disp: , Rfl:  .  cetirizine (ZYRTEC) 10 MG tablet, Take 10 mg by mouth 2 (two) times daily. , Disp: , Rfl:  .  CLINPRO 5000 1.1 % PSTE, See admin instructions. (Patient not taking: Reported on 03/02/2020), Disp: , Rfl:  .  denosumab (PROLIA) 60 MG/ML SOLN injection, Inject 60 mg into the skin every 6 (six) months. Administer in upper arm, thigh, or abdomen, Disp: , Rfl:  .  famotidine (PEPCID) 40 MG tablet, Take 1 tablet (40 mg total) by mouth daily. (Patient not taking: Reported on 03/02/2020), Disp: 30 tablet, Rfl: 5 .  Fluticasone Propionate (FLONASE NA), , Disp: , Rfl:  .  furosemide (LASIX) 20 MG tablet, Take 20 mg by mouth daily., Disp: , Rfl:  .  LORazepam (ATIVAN) 1 MG tablet, Take 1 mg by mouth at bedtime as needed (spasms)., Disp: , Rfl:  .  MAGNESIUM PO, Take 400 mg by mouth daily. , Disp: ,  Rfl:  .  pantoprazole (PROTONIX) 20 MG tablet, TAKE 1 TABLET BY MOUTH EVERY DAY (Patient not taking: Reported on 03/02/2020), Disp: 90 tablet, Rfl: 1 .  valsartan (DIOVAN) 160 MG tablet, TAKE 1/2 TABLET BY MOUTH EVERY DAY, Disp: 45 tablet, Rfl: 1 .  VITAMIN D PO, Take by mouth., Disp: , Rfl:   EXAM:  VITALS per patient if applicable:  GENERAL: alert, oriented, appears well and in no acute distress  HEENT: atraumatic, conjunttiva clear, no obvious abnormalities on inspection of external nose and ears  NECK: normal movements of the head and neck  LUNGS: on inspection no signs of respiratory distress, breathing rate appears normal, no obvious gross SOB, gasping or wheezing  CV: no obvious cyanosis  MS: moves all visible extremities without noticeable abnormality  PSYCH/NEURO: pleasant and cooperative, no obvious depression or anxiety, speech and thought processing grossly intact  ASSESSMENT AND PLAN:  Discussed the following assessment and plan:  Hyponatremia  Alcohol use  Confusion  -I am glad that I got to talk with the needed today.  I can understand that the lifestyle changes are challenging for her.  I am glad she is feeling better today.  Advised that it would be very important,  however, to follow her nephrologist and cardiology recommendations in regards to her sodium.  Suggested that she cut back slowly and follow his guidelines strictly for the next several months.  Advised prompt PCP or nephrology follow-up if difficulty with this.  In terms of seeing neurology, advised if any further confusion develops, recurrent confusion, or confusion despite correction of the sodium, then it would be a good idea to get a further evaluation.    I discussed the assessment and treatment plan with the patient. The patient was provided an opportunity to ask questions and were answered. The patient agreed with the plan and demonstrated an understanding of the instructions.     Lucretia Kern, DO

## 2020-03-20 NOTE — Patient Instructions (Signed)
I know this is tough for you.   Please follow your nephrology and cardiology recommendations.  I really hope you are feeling better and the sodium is improving for you soon.  Please schedule prompt follow-up with [facility]'s office or nephrology if any further symptoms, or if you are having any difficulty following the treatment plan.

## 2020-03-24 ENCOUNTER — Ambulatory Visit (INDEPENDENT_AMBULATORY_CARE_PROVIDER_SITE_OTHER): Payer: Medicare Other | Admitting: Family Medicine

## 2020-03-24 ENCOUNTER — Ambulatory Visit (INDEPENDENT_AMBULATORY_CARE_PROVIDER_SITE_OTHER): Payer: Medicare Other

## 2020-03-24 ENCOUNTER — Encounter: Payer: Self-pay | Admitting: Family Medicine

## 2020-03-24 ENCOUNTER — Other Ambulatory Visit: Payer: Self-pay

## 2020-03-24 VITALS — BP 115/80 | HR 91 | Temp 97.8°F | Resp 16 | Ht <= 58 in | Wt 99.0 lb

## 2020-03-24 DIAGNOSIS — M150 Primary generalized (osteo)arthritis: Secondary | ICD-10-CM | POA: Diagnosis not present

## 2020-03-24 DIAGNOSIS — M5030 Other cervical disc degeneration, unspecified cervical region: Secondary | ICD-10-CM | POA: Diagnosis not present

## 2020-03-24 DIAGNOSIS — M654 Radial styloid tenosynovitis [de Quervain]: Secondary | ICD-10-CM | POA: Diagnosis not present

## 2020-03-24 DIAGNOSIS — Z6821 Body mass index (BMI) 21.0-21.9, adult: Secondary | ICD-10-CM | POA: Diagnosis not present

## 2020-03-24 DIAGNOSIS — M25531 Pain in right wrist: Secondary | ICD-10-CM

## 2020-03-24 DIAGNOSIS — M1990 Unspecified osteoarthritis, unspecified site: Secondary | ICD-10-CM | POA: Diagnosis not present

## 2020-03-24 DIAGNOSIS — M2550 Pain in unspecified joint: Secondary | ICD-10-CM | POA: Diagnosis not present

## 2020-03-24 DIAGNOSIS — M810 Age-related osteoporosis without current pathological fracture: Secondary | ICD-10-CM | POA: Diagnosis not present

## 2020-03-24 DIAGNOSIS — M25551 Pain in right hip: Secondary | ICD-10-CM | POA: Diagnosis not present

## 2020-03-24 DIAGNOSIS — M19031 Primary osteoarthritis, right wrist: Secondary | ICD-10-CM | POA: Diagnosis not present

## 2020-03-24 DIAGNOSIS — M19041 Primary osteoarthritis, right hand: Secondary | ICD-10-CM | POA: Diagnosis not present

## 2020-03-24 NOTE — Progress Notes (Signed)
ACUTE VISIT: Chief Complaint  Patient presents with  . Wrist Pain    Right wrist   HPI: [name] is a 75 y.o. right handed female with hx of PMR,OA,and HTN here with her husband today with above complaint. 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 She did not report this as worker's comp.  No hx of blunt trauma. + Edema, no erythema. Limitation of ROM. Pain is "bad", constant,exacerbated by palpation and movement with wrist and thumb. Pain is alleviated by rest.  She is concerned about possible stress fracture.  She has not noted rash,numbness,tingling,or ulcers. Negative for fever,chills,changes in appetite,or unusual fatigue.  She took Ibuprofen and Aleve.  She is trying to arrange appt with her rheumatologist.  Review of Systems  Constitutional: Positive for activity change. Negative for chills.  HENT: Negative for mouth sores and sore throat.   Respiratory: Negative for cough, shortness of breath and wheezing.   Cardiovascular: Negative for chest pain and palpitations.  Musculoskeletal: Negative for gait problem.  Skin: Negative for pallor and wound.  Psychiatric/Behavioral: Negative for confusion. The patient is nervous/anxious.   Rest see pertinent positives and negatives per HPI.  Current Outpatient Medications on File Prior to Visit  Medication Sig Dispense Refill  . Alirocumab (PRALUENT) 150 MG/ML SOAJ Inject 1 pen into the skin every 14 (fourteen) days. 6 mL 3  . amLODipine (NORVASC) 5 MG tablet TAKE 1 TABLET BY MOUTH EVERY DAY 90 tablet 1  . aspirin 81 MG tablet Take 81 mg by mouth daily.    . cetirizine (ZYRTEC) 10 MG tablet Take 10 mg by mouth 2 (two) times daily.     Marland Kitchen denosumab (PROLIA) 60 MG/ML SOLN injection Inject 60 mg into the skin every 6 (six) months. Administer in upper arm, thigh, or abdomen    . furosemide (LASIX) 20  MG tablet Take 20 mg by mouth daily.    Marland Kitchen LORazepam (ATIVAN) 1 MG tablet Take 1 mg by mouth at bedtime as needed (spasms).    Marland Kitchen MAGNESIUM PO Take 400 mg by mouth daily.     Marland Kitchen VITAMIN D PO Take by mouth.    Marland Kitchen CLINPRO 5000 1.1 % PSTE See admin instructions. (Patient not taking: Reported on 03/02/2020)    . famotidine (PEPCID) 40 MG tablet Take 1 tablet (40 mg total) by mouth daily. (Patient not taking: Reported on 03/02/2020) 30 tablet 5  . Fluticasone Propionate (FLONASE NA)  (Patient not taking: Reported on 03/02/2020)    . pantoprazole (PROTONIX) 20 MG tablet TAKE 1 TABLET BY MOUTH EVERY DAY (Patient not taking: Reported on 03/02/2020) 90 tablet 1  . valsartan (DIOVAN) 160 MG tablet TAKE 1/2 TABLET BY MOUTH EVERY DAY 45 tablet 1   No current facility-administered medications on file prior to visit.   Past Medical History:  Diagnosis Date  . Alcohol use   . Anemia   . Anxiety   . Asthma   . Basal cell carcinoma    forehead  . Breast cancer ([place]) 1998   left   . Colon polyps   . GERD (gastroesophageal reflux disease)   . Glaucoma   . Hyperlipidemia   . Hypertension   . Hyponatremia    remote, mild  . Mitral valve prolapse   . Osteoarthritis    sees rheumatologist  . Osteoporosis    sees rheumatologist  .  Personal history of radiation therapy 1998  . PMR (polymyalgia rheumatica) ([facility])    sees rheumatologist  . Pneumonia   . Prinzmetal angina ([place])   . White matter changes    ? ischemic sm vs per pt   Allergies  Allergen Reactions  . Tylenol [Acetaminophen] Cough  . Turmeric     Lowers BP  . Erythromycin     Stomach cramps  . Erythromycin Base Other (See Comments)    Stomach issues  . Other     Beta-blockers-patient states it makes her crazy  . Sulfa Antibiotics Other (See Comments)    Doesn't recall    Social History   Socioeconomic History  . Marital status: Married    Spouse name: Amanda Cannon   . Number of children: 3  . Years of education: 75  . Highest education  level: Some college, no degree  Occupational History  . Occupation: Psychologist, occupational  Tobacco Use  . Smoking status: Former Smoker    Packs/day: 1.00    Years: 7.00    Pack years: 7.00    Quit date: 01/25/1971    Years since quitting: 49.1  . Smokeless tobacco: Never Used  Vaping Use  . Vaping Use: Never used  Substance and Sexual Activity  . Alcohol use: Yes    Alcohol/week: 14.0 standard drinks    Types: 14 Standard drinks or equivalent per week    Comment: two drinks with burbon + water daily   . Drug use: No  . Sexual activity: Yes  Other Topics Concern  . 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 Difficulty of Paying Living Expenses: Not hard at all  Food Insecurity: No Food Insecurity  . Worried About Charity fundraiser in the Last Year: Never true  . Ran Out of Food in the Last Year: Never true  Transportation Needs: No Transportation Needs  . Lack of Transportation (Medical): No  . Lack of Transportation (Non-Medical): No  Physical Activity: Inactive  . Days of Exercise per Week: 0 days  . Minutes of Exercise per Session: 0 min  Stress: No Stress Concern Present  . Feeling of Stress : Not at all  Social Connections: Socially Integrated  . Frequency of Communication with Friends and Family: More than three times a week  . Frequency of Social Gatherings with Friends and Family: Once a week  . Attends Religious Services: More than 4 times per year  . Active Member of Clubs or Organizations: Yes  . Attends Archivist Meetings: More than 4 times per year  . Marital Status: Married    Vitals:   03/24/20 0903  BP: 115/80  Pulse: 91  Resp: 16  Temp: 97.8  F (36.6 C)  SpO2: 96%   Body mass index is 22.2 kg/m.  Physical Exam Vitals and nursing note reviewed.  Constitutional:      General: She is not in acute distress.    Appearance: She is not ill-appearing.  HENT:     Head: Normocephalic and atraumatic.  Eyes:     Conjunctiva/sclera: Conjunctivae normal.  Cardiovascular:  Rate and Rhythm: Normal rate and regular rhythm.  Pulmonary:     Effort: Pulmonary effort is normal. No respiratory distress.  Musculoskeletal:     Right wrist: Tenderness and snuff box tenderness present. No deformity. Decreased range of motion. Normal pulse.     Comments: Tenderness upon palpation of radial styloid, mild edema, no erythema appreciated. + Limitation of wrist ROM.  Moderate pain upon palpation of thenar area,around scaphoid. Pain also elicited on radial styloid with Finkelstein maneuver and with supination.   Skin:    General: Skin is warm.     Findings: No erythema, lesion or rash.  Neurological:     General: No focal deficit present.     Mental Status: She is alert and oriented to person, place, and time.     Gait: Gait normal.  Psychiatric:        Mood and Affect: Mood is anxious.   ASSESSMENT AND PLAN:  [name] was seen today for wrist pain.  Diagnoses and all orders for this visit: Orders Placed This Encounter  Procedures  . DG Wrist Complete Right  . DG Hand Complete Right    Right wrist pain We discussed possible etiologies. Acute exacerbation of OA ,sprain,tendinitis. X ray ordered today. She doe snot want a letter for work.  Tenosynovitis, de Quervain Discussed Dx,prognosis, and treatment options. She is planning on seeing rheumatologist. Wrist splint with thumb spica Rx given. She prefers no medication to manage pain.  Return if symptoms worsen or fail to improve.   Alisan Dokes G. Martin[place], MD  [facility]. [place] office.   A few things to remember from today's visit:   Tenosynovitis, de  Quervain  Right wrist pain - Plan: DG Wrist Complete Right, DG Hand Complete Right   De Quervain's Tenosynovitis  De Quervain's tenosynovitis is a condition that causes inflammation of the tendon on the thumb side of the wrist. Tendons are cords of tissue that connect bones to muscles. The tendons in the hand pass through a tunnel called a sheath. A slippery layer of tissue (synovium) lets the tendons move smoothly in the sheath. With de Quervain's tenosynovitis, the sheath swells or thickens, causing friction and pain. The condition is also called de Quervain's disease and de Quervain's syndrome. It occurs most often in women who are 71-72 years old. What are the causes? The exact cause of this condition is not known. It may be associated with overuse of the hand and wrist. What increases the risk? You are more likely to develop this condition if you:  Use your hands far more than normal, especially if you repeat certain movements that involve twisting your hand or using a tight grip.  Are pregnant.  Are a middle-aged woman.  Have rheumatoid arthritis.  Have diabetes. What are the signs or symptoms? The main symptom of this condition is pain on the thumb side of the wrist. The pain may get worse when you grasp something or turn your wrist. Other symptoms may include:  Pain that extends up the forearm.  Swelling of your wrist and hand.  Trouble moving the thumb and wrist.  A sensation of snapping in the wrist.  A bump filled with fluid (cyst) in the area of the pain. How is this diagnosed? This condition may be diagnosed based on:  Your symptoms and medical history.  A physical exam. During the exam, your health care provider may do a simple test Wynn Maudlin test) that involves pulling your thumb and wrist to see if  this causes pain. You may also need to have an X-ray or ultrasound. How is this treated? Treatment for this condition may include:  Avoiding any activity  that causes pain and swelling.  Taking medicines. Anti-inflammatory medicines and corticosteroid injections may be used to reduce inflammation and relieve pain.  Wearing a splint.  Having surgery. This may be needed if other treatments do not work. Once the pain and swelling have gone down, you may start:  Physical therapy. This includes exercises to improve movement and strength in your wrist and thumb.  Occupational therapy. This includes adjusting how you move your wrist. Follow these instructions at home: If you have a splint:  Wear the splint as told by your health care provider. Remove it only as told by your health care provider.  Loosen the splint if your fingers tingle, become numb, or turn cold and blue.  Keep the splint clean.  If the splint is not waterproof: ? Do not let it get wet. ? Cover it with a watertight covering when you take a bath or a shower. Managing pain, stiffness, and swelling  Avoid movements and activities that cause pain and swelling in the wrist area.  If directed, put ice on the painful area. This may be helpful after doing activities that involve the sore wrist. To do this: ? Put ice in a plastic bag. ? Place a towel between your skin and the bag. ? Leave the ice on for 20 minutes, 2-3 times a day. ? Remove the ice if your skin turns bright red. This is very important. If you cannot feel pain, heat, or cold, you have a greater risk of damage to the area.  Move your fingers often to reduce stiffness and swelling.  Raise (elevate) the injured area above the level of your heart while you are sitting or lying down.   General instructions  Return to your normal activities as told by your health care provider. Ask your health care provider what activities are safe for you.  Take over-the-counter and prescription medicines only as told by your health care provider.  Keep all follow-up visits. This is important. Contact a health care provider  if:  Your pain medicine does not help.  Your pain gets worse.  You develop new symptoms. Summary  De Quervain's tenosynovitis is a condition that causes inflammation of the tendon on the thumb side of the wrist.  The condition occurs most often in women who are 62-27 years old.  The exact cause of this condition is not known. It may be associated with overuse of the hand and wrist.  Treatment starts with avoiding activity that causes pain or swelling in the wrist area. Other treatments may include wearing a splint and taking medicine. Sometimes, surgery is needed. This information is not intended to replace advice given to you by your health care provider. Make sure you discuss any questions you have with your health care provider. Document Revised: 04/24/2019 Document Reviewed: 04/24/2019 Elsevier Patient Education  2021 [place].  Please be sure medication list is accurate. If a new problem present, please set up appointment sooner than planned today.

## 2020-03-24 NOTE — Patient Instructions (Addendum)
A few things to remember from today's visit:   Tenosynovitis, de Quervain  Right wrist pain - Plan: DG Wrist Complete Right, DG Hand Complete Right   De Quervain's Tenosynovitis  De Quervain's tenosynovitis is a condition that causes inflammation of the tendon on the thumb side of the wrist. Tendons are cords of tissue that connect bones to muscles. The tendons in the hand pass through a tunnel called a sheath. A slippery layer of tissue (synovium) lets the tendons move smoothly in the sheath. With de Quervain's tenosynovitis, the sheath swells or thickens, causing friction and pain. The condition is also called de Quervain's disease and de Quervain's syndrome. It occurs most often in women who are 34-51 years old. What are the causes? The exact cause of this condition is not known. It may be associated with overuse of the hand and wrist. What increases the risk? You are more likely to develop this condition if you:  Use your hands far more than normal, especially if you repeat certain movements that involve twisting your hand or using a tight grip.  Are pregnant.  Are a middle-aged woman.  Have rheumatoid arthritis.  Have diabetes. What are the signs or symptoms? The main symptom of this condition is pain on the thumb side of the wrist. The pain may get worse when you grasp something or turn your wrist. Other symptoms may include:  Pain that extends up the forearm.  Swelling of your wrist and hand.  Trouble moving the thumb and wrist.  A sensation of snapping in the wrist.  A bump filled with fluid (cyst) in the area of the pain. How is this diagnosed? This condition may be diagnosed based on:  Your symptoms and medical history.  A physical exam. During the exam, your health care provider may do a simple test Wynn Maudlin test) that involves pulling your thumb and wrist to see if this causes pain. You may also need to have an X-ray or ultrasound. How is this  treated? Treatment for this condition may include:  Avoiding any activity that causes pain and swelling.  Taking medicines. Anti-inflammatory medicines and corticosteroid injections may be used to reduce inflammation and relieve pain.  Wearing a splint.  Having surgery. This may be needed if other treatments do not work. Once the pain and swelling have gone down, you may start:  Physical therapy. This includes exercises to improve movement and strength in your wrist and thumb.  Occupational therapy. This includes adjusting how you move your wrist. Follow these instructions at home: If you have a splint:  Wear the splint as told by your health care provider. Remove it only as told by your health care provider.  Loosen the splint if your fingers tingle, become numb, or turn cold and blue.  Keep the splint clean.  If the splint is not waterproof: ? Do not let it get wet. ? Cover it with a watertight covering when you take a bath or a shower. Managing pain, stiffness, and swelling  Avoid movements and activities that cause pain and swelling in the wrist area.  If directed, put ice on the painful area. This may be helpful after doing activities that involve the sore wrist. To do this: ? Put ice in a plastic bag. ? Place a towel between your skin and the bag. ? Leave the ice on for 20 minutes, 2-3 times a day. ? Remove the ice if your skin turns bright red. This is very important. If you cannot  feel pain, heat, or cold, you have a greater risk of damage to the area.  Move your fingers often to reduce stiffness and swelling.  Raise (elevate) the injured area above the level of your heart while you are sitting or lying down.   General instructions  Return to your normal activities as told by your health care provider. Ask your health care provider what activities are safe for you.  Take over-the-counter and prescription medicines only as told by your health care provider.  Keep  all follow-up visits. This is important. Contact a health care provider if:  Your pain medicine does not help.  Your pain gets worse.  You develop new symptoms. Summary  De Quervain's tenosynovitis is a condition that causes inflammation of the tendon on the thumb side of the wrist.  The condition occurs most often in women who are 78-61 years old.  The exact cause of this condition is not known. It may be associated with overuse of the hand and wrist.  Treatment starts with avoiding activity that causes pain or swelling in the wrist area. Other treatments may include wearing a splint and taking medicine. Sometimes, surgery is needed. This information is not intended to replace advice given to you by your health care provider. Make sure you discuss any questions you have with your health care provider. Document Revised: 04/24/2019 Document Reviewed: 04/24/2019 Elsevier Patient Education  2021 [place].  Please be sure medication list is accurate. If a new problem present, please set up appointment sooner than planned today.

## 2020-03-30 ENCOUNTER — Other Ambulatory Visit: Payer: Medicare Other

## 2020-03-30 ENCOUNTER — Ambulatory Visit: Payer: Medicare Other | Admitting: Gastroenterology

## 2020-03-30 ENCOUNTER — Telehealth (INDEPENDENT_AMBULATORY_CARE_PROVIDER_SITE_OTHER): Payer: Medicare Other | Admitting: Family Medicine

## 2020-03-30 DIAGNOSIS — J069 Acute upper respiratory infection, unspecified: Secondary | ICD-10-CM | POA: Diagnosis not present

## 2020-03-30 NOTE — Progress Notes (Signed)
Pt had difficulty getting on call with provider.  This provider could see the pt but not hear the pt.  Pt states she would like to cancel the appt, however tells provider the below:  Virtual Visit via Telephone Note  I connected with Trellis Moment on 03/30/20 at 10:00 AM EST by telephone and verified that I am speaking with the correct person using two identifiers.   I discussed the limitations, risks, security and privacy concerns of performing an evaluation and management service by telephone and the availability of in person appointments. I also discussed with the patient that there may be a patient responsible charge related to this service. The patient expressed understanding and agreed to proceed.  Location patient: home Location provider: work or home office Participants present for the call: patient, provider Patient did not have a visit in the prior 7 days to address this/these issue(s).   History of Present Illness: Layrngitis, cough, fatigue, some HAs, decreased appetite-lost 4 lbs,  COVID test negative on Thursday.  Pt not drinking much 2/2 hyponatremia.  Having 2 cups of coffee and tea.    Has gatorade.   Observations/Objective: Patient sounds cheerful and well on the phone. I do not appreciate any SOB. Speech and thought processing are grossly intact. Patient reported vitals:  Assessment and Plan: Viral URI -continue supportive care -given precautions  Follow Up Instructions:  F/u prn   [id number] [id number] [id number] 11-20 [id number] 21-30 I did not refer this patient for an OV in the next 24 hours for this/these issue(s).  I discussed the assessment and treatment plan with the patient. The patient was provided an opportunity to ask questions and all were answered. The patient agreed with the plan and demonstrated an understanding of the instructions.   The patient was advised to call back or seek an in-person evaluation if the symptoms worsen or if the condition fails to  improve as anticipated.  I provided 7:35 minutes of non-face-to-face time during this encounter.   Billie Ruddy, MD

## 2020-03-31 ENCOUNTER — Telehealth: Payer: Self-pay | Admitting: Family Medicine

## 2020-03-31 ENCOUNTER — Encounter: Payer: Self-pay | Admitting: Family Medicine

## 2020-03-31 NOTE — Telephone Encounter (Signed)
Spoke with the pt to offer a virtual visit and patient declined as she stated she had a visit with Dr Volanda Napoleon yesterday and wanted to let PCP know she has Covid.  Message sent to PCP.

## 2020-03-31 NOTE — Telephone Encounter (Signed)
See Mychart message in which the pt was advised to call the office for a virtual visit.

## 2020-03-31 NOTE — Telephone Encounter (Signed)
FYI:  Pt called to let us know that she has COVID and do not know what to do and will be going back to bed.

## 2020-04-01 NOTE — Telephone Encounter (Signed)
Patient and husband tested positive on Monday, but has not been feeling well since last Thursday.  She is complaining of head, throat, and chest congestion.  She has a cough, but no fever.

## 2020-04-01 NOTE — Telephone Encounter (Signed)
Spoke and she is aware of Dr Berenice Bouton recommendations.  She is already taking Mucinex and is drinking Gatorade.  Patient will call back as needed.

## 2020-04-01 NOTE — Telephone Encounter (Signed)
Please see how she is feeling today? When did she test positive (Dr. Volanda Napoleon note is still pending so i'm not sure details)? Just wondering for symptom treatment options.

## 2020-04-01 NOTE — Telephone Encounter (Signed)
Ok. With this timeline, we don't really have outpatient treatment options (she is past the window of benefit with the oral antiviral treatment as well). I would encourage rest, fluids, mucinex (to help break up mucous) and keep an eye on breathing. If any worsening of symptoms let us know. Hopefully sick symptoms will be diminished due to vaccination status.

## 2020-04-04 ENCOUNTER — Encounter: Payer: Self-pay | Admitting: Family Medicine

## 2020-04-06 ENCOUNTER — Other Ambulatory Visit: Payer: Medicare Other

## 2020-04-07 DIAGNOSIS — M810 Age-related osteoporosis without current pathological fracture: Secondary | ICD-10-CM | POA: Diagnosis not present

## 2020-04-07 DIAGNOSIS — M654 Radial styloid tenosynovitis [de Quervain]: Secondary | ICD-10-CM | POA: Diagnosis not present

## 2020-04-07 DIAGNOSIS — M1990 Unspecified osteoarthritis, unspecified site: Secondary | ICD-10-CM | POA: Diagnosis not present

## 2020-04-07 DIAGNOSIS — M2550 Pain in unspecified joint: Secondary | ICD-10-CM | POA: Diagnosis not present

## 2020-04-07 DIAGNOSIS — Z79899 Other long term (current) drug therapy: Secondary | ICD-10-CM | POA: Diagnosis not present

## 2020-04-07 DIAGNOSIS — M5030 Other cervical disc degeneration, unspecified cervical region: Secondary | ICD-10-CM | POA: Diagnosis not present

## 2020-04-07 DIAGNOSIS — M150 Primary generalized (osteo)arthritis: Secondary | ICD-10-CM | POA: Diagnosis not present

## 2020-04-07 DIAGNOSIS — Z6820 Body mass index (BMI) 20.0-20.9, adult: Secondary | ICD-10-CM | POA: Diagnosis not present

## 2020-04-07 DIAGNOSIS — M25551 Pain in right hip: Secondary | ICD-10-CM | POA: Diagnosis not present

## 2020-04-11 ENCOUNTER — Encounter: Payer: Self-pay | Admitting: Family Medicine

## 2020-05-07 ENCOUNTER — Encounter: Payer: Self-pay | Admitting: Gastroenterology

## 2020-05-07 ENCOUNTER — Ambulatory Visit (INDEPENDENT_AMBULATORY_CARE_PROVIDER_SITE_OTHER): Payer: Medicare Other | Admitting: Gastroenterology

## 2020-05-07 VITALS — BP 126/80 | HR 92 | Ht <= 58 in | Wt 94.5 lb

## 2020-05-07 DIAGNOSIS — K5904 Chronic idiopathic constipation: Secondary | ICD-10-CM

## 2020-05-07 DIAGNOSIS — R1030 Lower abdominal pain, unspecified: Secondary | ICD-10-CM

## 2020-05-07 NOTE — Progress Notes (Signed)
    History of Present Illness: This is a 75 year old female with constipation.  She is accompanied by her husband.  She states her constipation worsened since she was advised to stop magnesium supplements due to electrolyte problems.  She was having an occasional loose stool but generally magnesium only lead to normal bowel movements.  She states it can be 2 to 4 days between bowel movements and she develops increased lower abdominal pain and rectal discomfort.  No other gastrointestinal complaints.  Colonoscopy 09/2017 - Diverticulosis in the left colon. - The examination was otherwise normal on direct and retroflexion views. - No specimens collected.  Current Medications, Allergies, Past Medical History, Past Surgical History, Family History and Social History were reviewed in [employer] record.   Physical Exam: General: Well developed, well nourished, no acute distress Head: Normocephalic and atraumatic Eyes: Sclerae anicteric, EOMI Ears: Normal auditory acuity Mouth: Not examined, mask on during Covid-19 pandemic Lungs: Clear throughout to auscultation Heart: Regular rate and rhythm; no murmurs, rubs or bruits Abdomen: Soft, non tender and non distended. No masses, hepatosplenomegaly or hernias noted. Normal Bowel sounds Rectal: Not done Musculoskeletal: Symmetrical with no gross deformities  Pulses:  Normal pulses noted Extremities: No clubbing, cyanosis, edema or deformities noted Neurological: Alert oriented x 4, grossly nonfocal Psychological:  Alert and cooperative. Normal mood and affect   Assessment and Recommendations:  1.  Chronic idiopathic constipation.  Adequate daily water and fiber intake.  Begin MiraLAX titrated between 1 and 3 doses per day for complete bowel movement every day or at least every other day.  She is advised to call if this is not effective.  2.  Family history of colon cancer in multiple second-degree relatives.  Personal  history of adenomatous colon polyps.  Surveillance colonoscopy due in September 2024.

## 2020-05-07 NOTE — Patient Instructions (Signed)
Take over the counter Miralax 1-3 x daily for constipation.  Thank you for choosing me and [facility].  Pricilla Riffle. Dagoberto Ligas., MD., [facility] Regal

## 2020-05-12 DIAGNOSIS — M810 Age-related osteoporosis without current pathological fracture: Secondary | ICD-10-CM | POA: Diagnosis not present

## 2020-05-13 DIAGNOSIS — Z9189 Other specified personal risk factors, not elsewhere classified: Secondary | ICD-10-CM | POA: Insufficient documentation

## 2020-05-13 DIAGNOSIS — M810 Age-related osteoporosis without current pathological fracture: Secondary | ICD-10-CM | POA: Diagnosis not present

## 2020-05-13 DIAGNOSIS — Z170 Estrogen receptor positive status [ER+]: Secondary | ICD-10-CM | POA: Diagnosis not present

## 2020-05-13 DIAGNOSIS — M353 Polymyalgia rheumatica: Secondary | ICD-10-CM | POA: Diagnosis not present

## 2020-05-13 DIAGNOSIS — C50512 Malignant neoplasm of lower-outer quadrant of left female breast: Secondary | ICD-10-CM | POA: Diagnosis not present

## 2020-05-13 DIAGNOSIS — N644 Mastodynia: Secondary | ICD-10-CM | POA: Diagnosis not present

## 2020-05-13 DIAGNOSIS — Z9229 Personal history of other drug therapy: Secondary | ICD-10-CM | POA: Diagnosis not present

## 2020-05-14 ENCOUNTER — Other Ambulatory Visit: Payer: Self-pay | Admitting: Family Medicine

## 2020-05-20 ENCOUNTER — Other Ambulatory Visit: Payer: Medicare Other

## 2020-05-22 DIAGNOSIS — Z23 Encounter for immunization: Secondary | ICD-10-CM | POA: Diagnosis not present

## 2020-05-26 DIAGNOSIS — L814 Other melanin hyperpigmentation: Secondary | ICD-10-CM | POA: Diagnosis not present

## 2020-05-26 DIAGNOSIS — L298 Other pruritus: Secondary | ICD-10-CM | POA: Diagnosis not present

## 2020-05-26 DIAGNOSIS — L570 Actinic keratosis: Secondary | ICD-10-CM | POA: Diagnosis not present

## 2020-05-26 DIAGNOSIS — D2272 Melanocytic nevi of left lower limb, including hip: Secondary | ICD-10-CM | POA: Diagnosis not present

## 2020-05-26 DIAGNOSIS — D692 Other nonthrombocytopenic purpura: Secondary | ICD-10-CM | POA: Diagnosis not present

## 2020-05-26 DIAGNOSIS — L821 Other seborrheic keratosis: Secondary | ICD-10-CM | POA: Diagnosis not present

## 2020-05-26 DIAGNOSIS — Z85828 Personal history of other malignant neoplasm of skin: Secondary | ICD-10-CM | POA: Diagnosis not present

## 2020-05-28 DIAGNOSIS — F320 Major depressive disorder, single episode, mild: Secondary | ICD-10-CM | POA: Diagnosis not present

## 2020-05-29 ENCOUNTER — Ambulatory Visit: Payer: Medicare Other | Admitting: Family Medicine

## 2020-06-05 ENCOUNTER — Encounter: Payer: Self-pay | Admitting: Family Medicine

## 2020-06-05 ENCOUNTER — Other Ambulatory Visit: Payer: Self-pay

## 2020-06-05 ENCOUNTER — Ambulatory Visit (INDEPENDENT_AMBULATORY_CARE_PROVIDER_SITE_OTHER): Payer: Medicare Other | Admitting: Family Medicine

## 2020-06-05 VITALS — BP 122/82 | HR 87 | Temp 98.1°F | Ht <= 58 in | Wt 96.2 lb

## 2020-06-05 DIAGNOSIS — Z1231 Encounter for screening mammogram for malignant neoplasm of breast: Secondary | ICD-10-CM | POA: Diagnosis not present

## 2020-06-05 DIAGNOSIS — M353 Polymyalgia rheumatica: Secondary | ICD-10-CM

## 2020-06-05 DIAGNOSIS — R42 Dizziness and giddiness: Secondary | ICD-10-CM

## 2020-06-05 DIAGNOSIS — E7849 Other hyperlipidemia: Secondary | ICD-10-CM

## 2020-06-05 DIAGNOSIS — E871 Hypo-osmolality and hyponatremia: Secondary | ICD-10-CM | POA: Diagnosis not present

## 2020-06-05 DIAGNOSIS — E559 Vitamin D deficiency, unspecified: Secondary | ICD-10-CM

## 2020-06-05 DIAGNOSIS — J4520 Mild intermittent asthma, uncomplicated: Secondary | ICD-10-CM | POA: Diagnosis not present

## 2020-06-05 DIAGNOSIS — I10 Essential (primary) hypertension: Secondary | ICD-10-CM | POA: Diagnosis not present

## 2020-06-05 DIAGNOSIS — K219 Gastro-esophageal reflux disease without esophagitis: Secondary | ICD-10-CM

## 2020-06-05 LAB — CBC WITH DIFFERENTIAL/PLATELET
Basophils Absolute: 0.1 10*3/uL (ref 0.0–0.1)
Basophils Relative: 0.8 % (ref 0.0–3.0)
Eosinophils Absolute: 0.2 10*3/uL (ref 0.0–0.7)
Eosinophils Relative: 2.6 % (ref 0.0–5.0)
HCT: 36.9 % (ref 36.0–46.0)
Hemoglobin: 12.6 g/dL (ref 12.0–15.0)
Lymphocytes Relative: 22.5 % (ref 12.0–46.0)
Lymphs Abs: 1.5 10*3/uL (ref 0.7–4.0)
MCHC: 34 g/dL (ref 30.0–36.0)
MCV: 96.4 fl (ref 78.0–100.0)
Monocytes Absolute: 0.8 10*3/uL (ref 0.1–1.0)
Monocytes Relative: 12.2 % — ABNORMAL HIGH (ref 3.0–12.0)
Neutro Abs: 4.1 10*3/uL (ref 1.4–7.7)
Neutrophils Relative %: 61.9 % (ref 43.0–77.0)
Platelets: 250 10*3/uL (ref 150.0–400.0)
RBC: 3.83 Mil/uL — ABNORMAL LOW (ref 3.87–5.11)
RDW: 14.2 % (ref 11.5–15.5)
WBC: 6.7 10*3/uL (ref 4.0–10.5)

## 2020-06-05 LAB — COMPREHENSIVE METABOLIC PANEL
ALT: 19 U/L (ref 0–35)
AST: 29 U/L (ref 0–37)
Albumin: 4.7 g/dL (ref 3.5–5.2)
Alkaline Phosphatase: 77 U/L (ref 39–117)
BUN: 20 mg/dL (ref 6–23)
CO2: 25 mEq/L (ref 19–32)
Calcium: 10 mg/dL (ref 8.4–10.5)
Chloride: 95 mEq/L — ABNORMAL LOW (ref 96–112)
Creatinine, Ser: 0.77 mg/dL (ref 0.40–1.20)
GFR: 75.36 mL/min (ref >60.00)
Glucose, Bld: 98 mg/dL (ref 70–99)
Potassium: 4.6 mEq/L (ref 3.5–5.1)
Sodium: 131 mEq/L — ABNORMAL LOW (ref 135–145)
Total Bilirubin: 0.5 mg/dL (ref 0.2–1.2)
Total Protein: 7.8 g/dL (ref 6.0–8.3)

## 2020-06-05 LAB — SEDIMENTATION RATE: Sed Rate: 38 mm/hr — ABNORMAL HIGH (ref 0–30)

## 2020-06-05 LAB — VITAMIN D 25 HYDROXY (VIT D DEFICIENCY, FRACTURES): VITD: 69.94 ng/mL (ref 30.00–100.00)

## 2020-06-05 LAB — PHOSPHORUS: Phosphorus: 4.1 mg/dL (ref 2.3–4.6)

## 2020-06-05 LAB — C-REACTIVE PROTEIN: CRP: <1 mg/dL (ref 0.5–20.0)

## 2020-06-05 NOTE — Progress Notes (Signed)
Amanda Cannon DOB: Jun 13, 1945 Encounter date: 06/05/2020  This is a 75 y.o. female who presents with Chief Complaint  Patient presents with  . Follow-up    History of present illness: Last visit with me was 6 months ago.  Saw Dr. Fuller Plan 4/14: They discussed constipation and she was instructed to use MiraLAX titrated 1-3 doses per day.  She is due for surveillance colonoscopy in September 2024. Was doing miralax - has found though that pumpkin seeds are helping. Did take magnesium other day to help. This seems to help her right away. Trying not to use it so that her labs will be ok.   She also had a virtual visit in March for cough fatigue headache weight loss and laryngitis. She did end up having COVID back in March. Got booster for COVID as well since that time. Cough is better; still feels voice is different. Some cough at night. Not sure it is not just allergies at this point. Energy level is ok. Not feeling sick.   She did have some recent hyponatremia and had a virtual visit back in February to review this.  She is working with her nephrologist, Dr. Posey Pronto, on fluid restriction as well as cutting back on alcohol.  She had some concerns as well about seeing neurology due to her ongoing vertigo as well as some of the cloudiness that occurred when she was more hyponatremic.headaches were an ongoing issue when we saw her last. Does feel like she has memory problems. Headaches are better - just some aching. Still with the vertigo - she is frustrated with the vestibular issue - affects her ability to walk straight - affects her daily - just walking through grocery store. She is trying certrizine, flonase. She does think aleve she is taking for arthritis is helpful for her. Was given sonata from psychiatry to help with sleep, but she isn't taking this because it is contraindicated with lorazepam and alcohol.   Allergies/asthma on 08/06/2019.  They are working on allergy control, following up on  reflux control (currently using Protonix 20 mg in the morning and Pepcid 40 mg at night. Doesn't feel that this is working for her acid reflux - states that she is going to make follow up with Dr. Fuller Plan. Stomach has felt better after stopping aleve.   Hypertension/mitral valve prolapse/hyperlipidemia:follows with Dr. Tamala Julian. On Repatha.  LDL has been less than 100.  Did feel that vestibular sx improved with PT; but doesn't stay better unless she is constantly doing her neck exercises. she is having startle reflexes - thinks related to her ear. Hear is pounding.   Hx PMR/osteoporosis/arthritis: on prolia, seeing Dr. Amil Amen. Doing well with this. Did have flare of arthritis in thumb after scanning tickets.    Allergies  Allergen Reactions  . Tylenol [Acetaminophen] Cough  . Turmeric     Lowers BP  . Erythromycin     Stomach cramps  . Erythromycin Base Other (See Comments)    Stomach issues  . Other     Beta-blockers-patient states it makes her crazy  . Sulfa Antibiotics Other (See Comments)    Doesn't recall   Current Meds  Medication Sig  . Alirocumab (PRALUENT) 150 MG/ML SOAJ Inject 1 pen into the skin every 14 (fourteen) days.  Marland Kitchen amLODipine (NORVASC) 5 MG tablet TAKE 1 TABLET BY MOUTH EVERY DAY  . aspirin 81 MG tablet Take 81 mg by mouth daily.  . cetirizine (ZYRTEC) 10 MG tablet Take 10 mg by mouth 2 (  two) times daily.   Marland Kitchen denosumab (PROLIA) 60 MG/ML SOLN injection Inject 60 mg into the skin every 6 (six) months. Administer in upper arm, thigh, or abdomen  . furosemide (LASIX) 20 MG tablet Take 20 mg by mouth daily.  Marland Kitchen LORazepam (ATIVAN) 1 MG tablet Take 1 mg by mouth at bedtime as needed (spasms).  Marland Kitchen MAGNESIUM PO Take 400 mg by mouth every 3 (three) days.  Marland Kitchen omeprazole (PRILOSEC) 20 MG capsule Take 20 mg by mouth daily.  Marland Kitchen VITAMIN D PO Take by mouth.    Review of Systems  Constitutional: Negative for chills, fatigue and fever.  Respiratory: Negative for cough, chest  tightness, shortness of breath and wheezing.   Cardiovascular: Negative for chest pain, palpitations and leg swelling.  Neurological: Positive for dizziness (still has issues with vertigo) and headaches (subtle; improved overall).  Psychiatric/Behavioral: Positive for sleep disturbance.    Objective:  BP 122/82 (BP Location: Right Arm, Patient Position: Sitting, Cuff Size: Normal)   Pulse 87   Temp 98.1 F (36.7 C) (Oral)   Ht 4' 8.25" (1.429 m)   Wt 96 lb 3.2 oz (43.6 kg)   SpO2 98%   BMI 21.38 kg/m   Weight: 96 lb 3.2 oz (43.6 kg)   BP Readings from Last 3 Encounters:  06/05/20 122/82  05/07/20 126/80  03/24/20 115/80   Wt Readings from Last 3 Encounters:  06/05/20 96 lb 3.2 oz (43.6 kg)  05/07/20 94 lb 8 oz (42.9 kg)  03/24/20 99 lb (44.9 kg)    Physical Exam Constitutional:      General: She is not in acute distress.    Appearance: She is well-developed.  Cardiovascular:     Rate and Rhythm: Normal rate and regular rhythm.     Heart sounds: Normal heart sounds. No murmur heard. No friction rub.  Pulmonary:     Effort: Pulmonary effort is normal. No respiratory distress.     Breath sounds: Normal breath sounds. No wheezing or rales.  Musculoskeletal:     Right lower leg: No edema.     Left lower leg: No edema.  Neurological:     Mental Status: She is alert and oriented to person, place, and time.  Psychiatric:        Behavior: Behavior normal.     Assessment/Plan  1. Essential hypertension Well-controlled.  Continue amlodipine 5 mg daily. - CBC with Differential/Platelet; Future - Comprehensive metabolic panel; Future  2. Mild intermittent asthma without complication Breathing has been well controlled.  No inhaler use.  Has recovered well from [place].  3. Gastroesophageal reflux disease, unspecified whether esophagitis present Omeprazole 20 mg daily.  4. Hyponatremia Recheck levels today.  She is on fluid restriction per nephrology. - Phosphorus;  Future  5. Other hyperlipidemia Has been well controlled on the Praluent.  She does have follow-up with cardiology.  I will repeat cholesterol today since we are drawing other blood work. - NMR, lipoprofile  6. PMR (polymyalgia rheumatica) (HCC) Follows with rheumatology regularly.  I am going to add on sed rate and CRP to today's blood work to forward to rheumatology.  Pain has been well controlled. - Sedimentation rate; Future - C-reactive protein; Future  7. Vertigo Vestibular in nature.  This is a daily inconvenience/frustration for her.  She has had to modify how she performs all tasks in order to accommodate for her vertigo.  She brought up question of using lorazepam for vertigo as this was something her daughter had brought to  her attention.  She already takes lorazepam once a day, and her psychiatrist did not want her to be on more of this medication.  We discussed trying to split her lorazepam in half and taking it twice daily to see if that helps at all.  I wonder if she may benefit from some of the migraine preventative treatments to help improve vertigo episodes.  I was going to reach out to neurology, but I think I will have patient call and set up a follow-up visit with Dr. Jaynee Eagles since there are multiple minute of treatment options that she has not yet tried it.  8. Vitamin D deficiency - VITAMIN D 25 Hydroxy (Vit-D Deficiency, Fractures); Future  9. Encounter for screening mammogram for malignant neoplasm of breast - MM DIGITAL SCREENING BILATERAL; Future    Return in about 6 months (around 12/06/2020) for physical exam.    Micheline Rough, MD

## 2020-06-07 LAB — NMR, LIPOPROFILE
Cholesterol, Total: 199 mg/dL (ref 100–199)
HDL Particle Number: 46.4 umol/L (ref >=30.5)
HDL-C: 89 mg/dL (ref >39)
LDL Particle Number: 743 nmol/L (ref <1000)
LDL Size: 21.6 nm (ref >20.5)
LDL-C (NIH Calc): 91 mg/dL (ref 0–99)
LP-IR Score: 40 (ref <=45)
Small LDL Particle Number: <90 nmol/L (ref <=527)
Triglycerides: 113 mg/dL (ref 0–149)

## 2020-06-09 ENCOUNTER — Telehealth: Payer: Self-pay | Admitting: *Deleted

## 2020-06-09 ENCOUNTER — Telehealth: Payer: Self-pay | Admitting: Family Medicine

## 2020-06-09 ENCOUNTER — Encounter: Payer: Self-pay | Admitting: Family Medicine

## 2020-06-09 DIAGNOSIS — R42 Dizziness and giddiness: Secondary | ICD-10-CM

## 2020-06-09 NOTE — Telephone Encounter (Signed)
Patient informed of the message below and agreed to contact Dr Jaynee Eagles for an appt.

## 2020-06-09 NOTE — Telephone Encounter (Signed)
Pt is calling stating that she needs a referral to Dr. Coral Ceo (neurologist) due to the condition that is new to the pt and the facility it requiring it.

## 2020-06-09 NOTE — Telephone Encounter (Signed)
-----   Message from [name], MD sent at 06/05/2020 12:32 PM EDT ----- You can hold this until her lab results note comes back: just thinking through options we use for migraine treatment; which may also benefit her vertigo; and I think she should just set up a follow up with Dr. Jaynee Eagles. There are a lot of medications that could help with decreasing episodes of vertigo and may be worth trying, but I think that this direction would come better from neurology. I had been thinking before just in terms of treating the vertigo; but if there are "vertigo migraines"/episodes happening, then this would broaden treatment possibilities.

## 2020-06-10 NOTE — Telephone Encounter (Signed)
Noted. Will place.

## 2020-06-10 NOTE — Telephone Encounter (Signed)
See results note. 

## 2020-06-10 NOTE — Telephone Encounter (Signed)
Pt returned the call to the office. 

## 2020-06-13 ENCOUNTER — Encounter: Payer: Self-pay | Admitting: Family Medicine

## 2020-06-13 ENCOUNTER — Ambulatory Visit (HOSPITAL_COMMUNITY)
Admission: EM | Admit: 2020-06-13 | Discharge: 2020-06-13 | Disposition: A | Discharge status: Home / Self Care | Payer: Medicare Other

## 2020-06-15 NOTE — Telephone Encounter (Signed)
Noted  

## 2020-06-16 ENCOUNTER — Emergency Department (HOSPITAL_BASED_OUTPATIENT_CLINIC_OR_DEPARTMENT_OTHER)
Admission: EM | Admit: 2020-06-16 | Discharge: 2020-06-16 | Disposition: A | Discharge status: Home / Self Care | Payer: Medicare Other | Attending: Emergency Medicine | Admitting: Emergency Medicine

## 2020-06-16 ENCOUNTER — Other Ambulatory Visit: Payer: Self-pay

## 2020-06-16 ENCOUNTER — Encounter: Payer: Self-pay | Admitting: Family Medicine

## 2020-06-16 ENCOUNTER — Encounter (HOSPITAL_BASED_OUTPATIENT_CLINIC_OR_DEPARTMENT_OTHER): Payer: Self-pay | Admitting: Emergency Medicine

## 2020-06-16 ENCOUNTER — Telehealth: Payer: Self-pay | Admitting: Family Medicine

## 2020-06-16 DIAGNOSIS — R1310 Dysphagia, unspecified: Secondary | ICD-10-CM | POA: Insufficient documentation

## 2020-06-16 DIAGNOSIS — Z853 Personal history of malignant neoplasm of breast: Secondary | ICD-10-CM | POA: Diagnosis not present

## 2020-06-16 DIAGNOSIS — Z79899 Other long term (current) drug therapy: Secondary | ICD-10-CM | POA: Insufficient documentation

## 2020-06-16 DIAGNOSIS — Z7982 Long term (current) use of aspirin: Secondary | ICD-10-CM | POA: Insufficient documentation

## 2020-06-16 DIAGNOSIS — K209 Esophagitis, unspecified without bleeding: Secondary | ICD-10-CM

## 2020-06-16 DIAGNOSIS — Z85828 Personal history of other malignant neoplasm of skin: Secondary | ICD-10-CM | POA: Insufficient documentation

## 2020-06-16 DIAGNOSIS — J45909 Unspecified asthma, uncomplicated: Secondary | ICD-10-CM | POA: Insufficient documentation

## 2020-06-16 DIAGNOSIS — Z87891 Personal history of nicotine dependence: Secondary | ICD-10-CM | POA: Insufficient documentation

## 2020-06-16 DIAGNOSIS — I10 Essential (primary) hypertension: Secondary | ICD-10-CM | POA: Insufficient documentation

## 2020-06-16 DIAGNOSIS — J392 Other diseases of pharynx: Secondary | ICD-10-CM | POA: Diagnosis not present

## 2020-06-16 DIAGNOSIS — Z923 Personal history of irradiation: Secondary | ICD-10-CM | POA: Insufficient documentation

## 2020-06-16 LAB — GROUP A STREP BY PCR: Group A Strep by PCR: NOT DETECTED

## 2020-06-16 MED ORDER — SUCRALFATE 1 G PO TABS: 1.0000 g | ORAL_TABLET | Freq: Two times a day (BID) | ORAL | 0 refills | Status: DC

## 2020-06-16 NOTE — ED Triage Notes (Signed)
Sore throat x 1 week, no fever, negative home covid test . Dry cough . Denies shortness of breath nor chest pain.

## 2020-06-16 NOTE — Telephone Encounter (Signed)
Spoke with the pt and offered a virtual visit with Dr Maudie Mercury.  Patient declined as she stated she would prefer to come in to the office and I advised her with all of the symptoms she has, a virtual visit would be the visit with any provider in this case.  Patient declined and stated she is going to an urgent care.

## 2020-06-16 NOTE — ED Provider Notes (Addendum)
[facility] EMERGENCY DEPT Provider Note   CSN: [id number] Arrival date & time: 06/16/20  1053     History Chief Complaint  Patient presents with  . Sore Throat    Amanda Cannon is a 75 y.o. female.  HPI   75 year old female presents emergency department for evaluation of throat irritation.  She states about 2 weeks ago when she was trying to swallow her home medication she felt like 1 pill got stuck.  She decided to go to bed and let it dissolve.  When she woke up she had throat irritation that has continued for the past couple weeks.  She says it is intermittently uncomfortable to swallow but when she looked in her throat the other day she noticed that there was some redness and got concerned.  She is about to go on vacation so wanted to come in to be tested make sure there was not an infectious process.  Patient has had no difficulty swallowing since then, is tolerating solids and liquids without difficulty.  Past Medical History:  Diagnosis Date  . Alcohol use   . Anemia   . Anxiety   . Asthma   . Basal cell carcinoma    forehead  . Breast cancer ([place]) 1998   left   . Colon polyps   . GERD (gastroesophageal reflux disease)   . Glaucoma   . Hyperlipidemia   . Hypertension   . Hyponatremia    remote, mild  . Mitral valve prolapse   . Osteoarthritis    sees rheumatologist  . Osteoporosis    sees rheumatologist  . Personal history of radiation therapy 1998  . PMR (polymyalgia rheumatica) (HCC)    sees rheumatologist  . Pneumonia   . Prinzmetal angina ([place])   . White matter changes    ? ischemic sm vs per pt    Patient Active Problem List   Diagnosis Date Noted  . Degenerative cervical disc 01/01/2020  . Vertigo 08/06/2019  . Cough 08/06/2019  . Mixed rhinitis 08/06/2019  . Gastroesophageal reflux disease 08/06/2019  . Trigeminal neuropathy 11/13/2017  . PMR (polymyalgia rheumatica) (HCC) 04/24/2017  . Hyponatremia 07/15/2015  .  Hyperlipidemia 07/15/2015  . Esophageal spasm 07/15/2015  . Essential hypertension 07/15/2015  . Depression 07/15/2015  . Asthma 07/15/2015  . Tinnitus 07/15/2015  . Osteoporosis 07/15/2015  . Spasm   . Prinzmetal angina ([place])   . Mitral valve prolapse   . White matter changes     Past Surgical History:  Procedure Laterality Date  . ABDOMINAL HYSTERECTOMY    . BREAST LUMPECTOMY Left 1998   cancer  . CESAREAN SECTION     x 4  . COLONOSCOPY  07/2012   in [place]  . HERNIA REPAIR  2005   abdominal hernia repair  . HYSTEROTOMY    . LYMPH NODE DISSECTION Left   . POLYPECTOMY       OB History   No obstetric history on file.     Family History  Problem Relation Age of Onset  . Coronary artery disease Mother 38  . Coronary artery disease Father 60  . Cancer - Ovarian Sister   . Colon cancer Cousin        x 2  . Esophageal cancer Neg Hx   . Rectal cancer Neg Hx   . Stomach cancer Neg Hx     Social History   Tobacco Use  . Smoking status: Former Smoker    Packs/day: 1.00    Years: 7.00  Pack years: 7.00    Quit date: 01/25/1971    Years since quitting: 49.4  . Smokeless tobacco: Never Used  Vaping Use  . Vaping Use: Never used  Substance Use Topics  . Alcohol use: Yes    Alcohol/week: 14.0 standard drinks    Types: 14 Standard drinks or equivalent per week    Comment: two drinks with burbon + water daily   . Drug use: No    Home Medications Prior to Admission medications   Medication Sig Start Date End Date Taking? Authorizing Provider  Alirocumab (PRALUENT) 150 MG/ML SOAJ Inject 1 pen into the skin every 14 (fourteen) days. 01/27/20   Belva Crome, MD  amLODipine (NORVASC) 5 MG tablet TAKE 1 TABLET BY MOUTH EVERY DAY 05/14/20   [name], MD  aspirin 81 MG tablet Take 81 mg by mouth daily.    [provider]  cetirizine (ZYRTEC) 10 MG tablet Take 10 mg by mouth 2 (two) times daily.     [provider]  denosumab (PROLIA) 60 MG/ML  SOLN injection Inject 60 mg into the skin every 6 (six) months. Administer in upper arm, thigh, or abdomen    [provider]  furosemide (LASIX) 20 MG tablet Take 20 mg by mouth daily.    [provider]  LORazepam (ATIVAN) 1 MG tablet Take 1 mg by mouth at bedtime as needed (spasms).    [provider]  MAGNESIUM PO Take 400 mg by mouth every 3 (three) days.    [provider]  omeprazole (PRILOSEC) 20 MG capsule Take 20 mg by mouth daily.    [provider]  VITAMIN D PO Take by mouth.    [provider]    Allergies    Tylenol [acetaminophen], Turmeric, Erythromycin, Erythromycin base, Other, and Sulfa antibiotics  Review of Systems   Review of Systems  Constitutional: Negative for chills and fever.  HENT: Positive for postnasal drip, sore throat and trouble swallowing. Negative for congestion, mouth sores and voice change.   Respiratory: Negative for shortness of breath.   Cardiovascular: Negative for chest pain.  Gastrointestinal: Negative for abdominal pain.  Skin: Negative for rash.  Neurological: Negative for headaches.    Physical Exam Updated Vital Signs BP (!) 173/92 (BP Location: Right Arm)   Pulse 95   Temp 98.2 F (36.8 C) (Oral)   Resp 16   SpO2 100%   Physical Exam Vitals and nursing note reviewed.  Constitutional:      Appearance: Normal appearance.  HENT:     Head: Normocephalic.     Mouth/Throat:     Mouth: Mucous membranes are moist.     Comments: No pharyngeal erythema, exudates, uvula is midline and not swollen, no submandibular or cervical lymphadenopathy Cardiovascular:     Rate and Rhythm: Normal rate.  Pulmonary:     Effort: Pulmonary effort is normal. No respiratory distress.  Abdominal:     Palpations: Abdomen is soft.     Tenderness: There is no abdominal tenderness.  Skin:    General: Skin is warm.  Neurological:     Mental Status: She is alert and oriented to person, place, and  time. Mental status is at baseline.  Psychiatric:        Mood and Affect: Mood normal.     ED Results / Procedures / Treatments   Labs (all labs ordered are listed, but only abnormal results are displayed) Labs Reviewed  GROUP A STREP BY PCR  EKG None  Radiology No results found.  Procedures Procedures   Medications Ordered in ED Medications - No data to display  ED Course  I have reviewed the triage vital signs and the nursing notes.  Pertinent labs & imaging results that were available during my care of the patient were reviewed by me and considered in my medical decision making (see chart for details).    MDM Rules/Calculators/A&P                          75 year old female presents the emergency department throat irritation after a pill got stuck and she went to sleep.  I believe she is suffering from esophagitis possibly form irritation of pill.  No CP or SOB. Physical exam is unremarkable, she is afebrile.  Will rule out strep.  Patient states she took a home COVID and flu test which was negative.  She is declining retesting in regards to that.  If the strep testing is negative anticipate treating her symptomatically for esophagitis, educating her on the proper way to take pills and having her follow-up with her primary doctor.  She displays no other symptoms of dysphagia.  Final Clinical Impression(s) / ED Diagnoses Final diagnoses:  None    Rx / DC Orders ED Discharge Orders    None       Lorelle Gibbs, DO 06/16/20 1212    Stephano Arrants, Alvin Critchley, DO 06/16/20 1214    Jaben Benegas, Alvin Critchley, DO 06/16/20 1356

## 2020-06-16 NOTE — Telephone Encounter (Signed)
Pt is calling in wanting to see if she can be worked in for a sore throat (red and white spots on her throat), sneezing and coughing.  Pt stated that she tried to go to [facility] but there were none pt would like to have a call back.

## 2020-06-16 NOTE — Discharge Instructions (Addendum)
You have been seen and discharged from the emergency department.  I believe you are suffering from esophageal irritation, most likely from when the pill was stuck a couple weeks ago.  Continue to take your Prilosec as directed, take the Carafate as prescribed.  Avoid spicy/hot foods.  Follow-up with your primary provider for reevaluation and further care.  If your symptoms do not improve you may require further evaluation/testing.  Take home medications as prescribed. If you have any worsening symptoms or further concerns for your health please return to an emergency department for further evaluation.

## 2020-06-16 NOTE — ED Notes (Signed)
Patient states had pill stuck in throat week ago and had sore throat since.  States has some nasal congestion but denies SOB or fever.  Non productive cought.

## 2020-06-16 NOTE — ED Notes (Signed)
Patient verbalizes understanding of discharge instructions. Opportunity for questioning and answers were provided. Armband removed by staff, pt discharged from ED.  

## 2020-06-16 NOTE — ED Notes (Signed)
ED Provider at bedside. 

## 2020-06-17 ENCOUNTER — Encounter: Payer: Self-pay | Admitting: Family Medicine

## 2020-06-19 ENCOUNTER — Encounter: Payer: Self-pay | Admitting: Family Medicine

## 2020-06-19 ENCOUNTER — Telehealth (INDEPENDENT_AMBULATORY_CARE_PROVIDER_SITE_OTHER): Payer: Medicare Other | Admitting: Family Medicine

## 2020-06-19 DIAGNOSIS — F439 Reaction to severe stress, unspecified: Secondary | ICD-10-CM

## 2020-06-19 DIAGNOSIS — J392 Other diseases of pharynx: Secondary | ICD-10-CM | POA: Diagnosis not present

## 2020-06-19 NOTE — Progress Notes (Signed)
Virtual Visit via Video Note  I connected with Trellis Moment on 06/19/20 at 10:30 AM EDT by a video enabled telemedicine application 2/2 [id number] pandemic and verified that I am speaking with the correct person using two identifiers.  Location patient: home Location provider:work or home office Persons participating in the virtual visit: patient, provider  I discussed the limitations of evaluation and management by telemedicine and the availability of in person appointments. The patient expressed understanding and agreed to proceed.   HPI: Pt upset.  Her brother is dying from [place], her best friend just died, and she felt she needed support from her health care provider/office and she did not get it.  Pt was upset she only could get a virtual visit for recent illness after swallowing a pill that got stuck in her throat and she has had difficulty scheduling appts with her pcp.  Attempted to be seen at numerous UCs but they were closed on the wknd.  Also wen to [facility] but did not want to wait around other sick pts.  Seen in [facility] ED 06/16/20, given carafate for esophagitis as strep test negative.  Pt does not feel like the medication is helping.  Pt tolerating solids and liquids, but felt some irritation in throat after swallowing a pill and eating a croissant.  Pt stopped taking flonase, ceterizine.  ROS: See pertinent positives and negatives per HPI.  Past Medical History:  Diagnosis Date  . Alcohol use   . Anemia   . Anxiety   . Asthma   . Basal cell carcinoma    forehead  . Breast cancer ([place]) 1998   left   . Colon polyps   . GERD (gastroesophageal reflux disease)   . Glaucoma   . Hyperlipidemia   . Hypertension   . Hyponatremia    remote, mild  . Mitral valve prolapse   . Osteoarthritis    sees rheumatologist  . Osteoporosis    sees rheumatologist  . Personal history of radiation therapy 1998  . PMR (polymyalgia rheumatica) (HCC)    sees rheumatologist  .  Pneumonia   . Prinzmetal angina ([place])   . White matter changes    ? ischemic sm vs per pt    Past Surgical History:  Procedure Laterality Date  . ABDOMINAL HYSTERECTOMY    . BREAST LUMPECTOMY Left 1998   cancer  . CESAREAN SECTION     x 4  . COLONOSCOPY  07/2012   in [place]  . HERNIA REPAIR  2005   abdominal hernia repair  . HYSTEROTOMY    . LYMPH NODE DISSECTION Left   . POLYPECTOMY      Family History  Problem Relation Age of Onset  . Coronary artery disease Mother 4  . Coronary artery disease Father 47  . Cancer - Ovarian Sister   . Colon cancer Cousin        x 2  . Esophageal cancer Neg Hx   . Rectal cancer Neg Hx   . Stomach cancer Neg Hx      Current Outpatient Medications:  .  Alirocumab (PRALUENT) 150 MG/ML SOAJ, Inject 1 pen into the skin every 14 (fourteen) days., Disp: 6 mL, Rfl: 3 .  amLODipine (NORVASC) 5 MG tablet, TAKE 1 TABLET BY MOUTH EVERY DAY, Disp: 90 tablet, Rfl: 1 .  aspirin 81 MG tablet, Take 81 mg by mouth daily., Disp: , Rfl:  .  cetirizine (ZYRTEC) 10 MG tablet, Take 10 mg by mouth 2 (two) times  daily. , Disp: , Rfl:  .  denosumab (PROLIA) 60 MG/ML SOLN injection, Inject 60 mg into the skin every 6 (six) months. Administer in upper arm, thigh, or abdomen, Disp: , Rfl:  .  furosemide (LASIX) 20 MG tablet, Take 20 mg by mouth daily., Disp: , Rfl:  .  LORazepam (ATIVAN) 1 MG tablet, Take 1 mg by mouth at bedtime as needed (spasms)., Disp: , Rfl:  .  MAGNESIUM PO, Take 400 mg by mouth every 3 (three) days., Disp: , Rfl:  .  omeprazole (PRILOSEC) 20 MG capsule, Take 20 mg by mouth daily., Disp: , Rfl:  .  sucralfate (CARAFATE) 1 g tablet, Take 1 tablet (1 g total) by mouth 2 (two) times daily for 10 days., Disp: 20 tablet, Rfl: 0 .  VITAMIN D PO, Take by mouth., Disp: , Rfl:   EXAM:  VITALS per patient if applicable:  RR between 12-20 bpm  GENERAL: alert, oriented, appears well and in no acute distress  HEENT: atraumatic, conjunctiva clear, no  obvious abnormalities on inspection of external nose and ears  NECK: normal movements of the head and neck  LUNGS: on inspection no signs of respiratory distress, breathing rate appears normal, no obvious gross SOB, gasping or wheezing  CV: no obvious cyanosis  MS: moves all visible extremities without noticeable abnormality  PSYCH/NEURO: pleasant and cooperative, no obvious depression or anxiety, speech and thought processing grossly intact  ASSESSMENT AND PLAN:  Discussed the following assessment and plan:  Throat irritation -noted after pill became stuck in throat. -Symptomatic treatment advised including gargling with warm salt water or Chloraseptic spray. -Patient reassured as eating and drinking will help dislodge/dissolve the pill.  Patient unable to increase p.o. intake of fluids 2/2 history of hyponatremia -Advised throat irritation self-limited -For continued or worsening symptoms consider referral to ENT for further evaluation  Stress -Self-care encouraged  Follow-up as needed.   I discussed the assessment and treatment plan with the patient. The patient was provided an opportunity to ask questions and all were answered. The patient agreed with the plan and demonstrated an understanding of the instructions.   The patient was advised to call back or seek an in-person evaluation if the symptoms worsen or if the condition fails to improve as anticipated.  Billie Ruddy, MD

## 2020-06-23 NOTE — Telephone Encounter (Signed)
Note was already forwarded to office admin

## 2020-06-30 NOTE — Telephone Encounter (Signed)
Spoke to patient in regards to below message. Pt had concern with being able to get into the office to see a provider when she is sick. I explained to the patient she currently is seeing a provider that is only in the office on Mon, Wed, and Fri. I did offer her the option  maybe seeing a different provider if she is requiring more access. She stated she would think and make a decision at a later date.

## 2020-07-01 DIAGNOSIS — K219 Gastro-esophageal reflux disease without esophagitis: Secondary | ICD-10-CM | POA: Diagnosis not present

## 2020-07-01 DIAGNOSIS — R1312 Dysphagia, oropharyngeal phase: Secondary | ICD-10-CM | POA: Diagnosis not present

## 2020-07-01 DIAGNOSIS — R490 Dysphonia: Secondary | ICD-10-CM | POA: Diagnosis not present

## 2020-07-23 ENCOUNTER — Ambulatory Visit (INDEPENDENT_AMBULATORY_CARE_PROVIDER_SITE_OTHER): Payer: Medicare Other | Admitting: Neurology

## 2020-07-23 ENCOUNTER — Encounter: Payer: Self-pay | Admitting: Neurology

## 2020-07-23 VITALS — BP 146/82 | HR 81 | Ht <= 58 in | Wt 95.0 lb

## 2020-07-23 DIAGNOSIS — M5442 Lumbago with sciatica, left side: Secondary | ICD-10-CM

## 2020-07-23 DIAGNOSIS — R270 Ataxia, unspecified: Secondary | ICD-10-CM | POA: Diagnosis not present

## 2020-07-23 DIAGNOSIS — R42 Dizziness and giddiness: Secondary | ICD-10-CM

## 2020-07-23 DIAGNOSIS — R269 Unspecified abnormalities of gait and mobility: Secondary | ICD-10-CM

## 2020-07-23 DIAGNOSIS — M546 Pain in thoracic spine: Secondary | ICD-10-CM | POA: Diagnosis not present

## 2020-07-23 DIAGNOSIS — G8929 Other chronic pain: Secondary | ICD-10-CM

## 2020-07-23 DIAGNOSIS — R2689 Other abnormalities of gait and mobility: Secondary | ICD-10-CM | POA: Diagnosis not present

## 2020-07-23 DIAGNOSIS — M5441 Lumbago with sciatica, right side: Secondary | ICD-10-CM

## 2020-07-23 NOTE — Patient Instructions (Addendum)
MRI thoracic and lumbar spine Find neuro-otologist

## 2020-07-23 NOTE — Progress Notes (Signed)
[id number]    Provider:  Dr Jaynee Eagles Referring Provider: [name], MD Primary Care Physician:  [name], MD  CC:  multiple neurologic symptoms  07/23/20: Here for dizziness. She had extensive testing  with Dr. Benjamine Mola who recommended vestibular therapy and she completed this > 6 weeks. MRI of the brain was unremarkable and mri c-spine showed no etiology. She continues to have dizziness. Dr. Clydene Pugh thinks she has a vestibular disorder. The vestibular therapy helped but she still continues ot have the dizziness. She has dizziness every day, she can't turn, she can't walk in the dark. When it is dark she has to use the wall and she feels off balance. If she looks straight ahead she can walk straight, the closeness of the aisles bothers focusing on her straight arm helps. She feels her memory is impaired. She keeps messing up her numbers, her words, she had an episode of hyponatremia. She has had an MRI of the brain and an MRI of the cervical spine in 01/22/2020. She feels she is not picking her feet up. She has had neuropathy a while in her leg on the left. She has low back pain. She itches in the middle of her back. Her voice feels lower, she was in the emergency room this past My for it. She had a thorough eval with Dr. Benjamine Mola and found edema in the larynx.   Interval history 04/24/2018: she feels her eustacian tube is involved. She went to Dr. Wilburn Cornelia. She feels it gets blocked. Flonase, decongestants doesn't help. She loses her words. It may be worse with the Lyrica but it was ongoing before. Feels it is getting worse. She notices and her family notices that she forgets conversations. Discussed the MRI brain. She has mild chronic changes small-vessel disease and mild atrophy.   HPI:  Amanda Cannon is a 75 y.o. female here as requested by Dr. Ethlyn Gallery for headache. Patient here with husband who also provides much information.   Past medical history osteoporosis,  glaucoma, PMR, arthritis, white matter lesions, finger spasms, neuropathy, breast cancer, hypertension, high cholesterol. Years ago patient reportedly had a scar and had minor surgery, a small face lift, and she has numbness in front of the ears due to that but many years later she is experiencing something different, sensory changes on the left side of the face, numbness has spread to the back of her head and ears and she feels like she is under water on the left side of the head and worse with the tv is "blaring". She gets numbness in the left eye lid. She feels like she has water in her head all the time, positional headache, ENT work up was negative. She has numbness on the left side of the face. Worse with getting upset and spreads. She gets a sudden pain in the back of her left eye and above her eye lasts one second and the other sensation is heaviness on the left. No significant pain or shooting, burning, tingling , just heaviness and an uncomfortable feeling in the last year not associated in the time course the face lift. She has extensive white matter changes in her brain and has been diagnosed with that, last imaging done in 2004 brain and 2011 cervical spine. She has multiple falls, imbalance, reported ataxia.  Reviewed notes, labs and imaging from outside physicians, which showed:  Review Dr. Julianne Rice notes.  Patient has facial numbness was started years ago.  Progressively worsening slowly and now  reports "I cannot take it anymore".  Symptoms include a numb feeling over the left upper face with tingling.and she has seen multiple neurologists. She had breast cancer and diagnosed with white matter ischemic disease in the past.   Reviewed notes sent with patient by Dr. Julianne Rice office.  [facility].  MRI of the cervical spine report showed at C3-C4 there is mild right paracentral left paracentral disc protrusion.  There are moderate bilateral neural foraminal stenoses.  At C4-C5  there is minimal degenerative central canal stenosis and market bilateral neuroforaminal stenosis.  At C5-C6 there are moderate bilateral neuroforaminal stenoses.  No evidence of cord compression.  Reviewed notes from [place].  75 year old woman with a history of multiple falls evaluate for intracranial abnormality.  MRI of the brain was performed, reviewed report, impression no acute intracranial abnormality, abnormal enhancement or recent infarct is identified.  Nonspecific white matter signal abnormalities which are felt likely related to chronic microvascular ischemic changes.  Also benign tonsillar ectopia a normal variant.  Review of Systems: Patient complains of symptoms per HPI as well as the following symptoms: imbalance . Pertinent negatives and positives per HPI. All others negative    Social History   Socioeconomic History   Marital status: Married    Spouse name: Zenia Resides    Number of children: 3   Years of education: 13   Highest education level: Some college, no degree  Occupational History   Occupation: Psychologist, occupational  Tobacco Use   Smoking status: Former    Packs/day: 1.00    Years: 7.00    Pack years: 7.00    Types: Cigarettes    Quit date: 01/25/1971    Years since quitting: 49.5   Smokeless tobacco: Never  Vaping Use   Vaping Use: Never used  Substance and Sexual Activity   Alcohol use: Yes    Alcohol/week: 14.0 standard drinks    Types: 14 Standard drinks or equivalent per week    Comment: two drinks with burbon + water daily    Drug use: No   Sexual activity: Yes  Other Topics Concern   Not on file  Social History Narrative   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 Situation: lives with husband      Spiritual Beliefs: Jewish      Lifestyle: active, diet is good      3 children + grandchildren   1 cat    Social Determinants of Radio broadcast assistant Strain: Low Risk    Difficulty of Paying  Living Expenses: Not hard at all  Food Insecurity: No Food Insecurity   Worried About Charity fundraiser in the Last Year: Never true   Arboriculturist in the Last Year: Never true  Transportation Needs: No Transportation Needs   Lack of Transportation (Medical): No   Lack of Transportation (Non-Medical): No  Physical Activity: Inactive   Days of Exercise per Week: 0 days   Minutes of Exercise per Session: 0 min  Stress: No Stress Concern Present   Feeling of Stress : Not at all  Social Connections: Socially Integrated   Frequency of Communication with Friends and Family: More than three times a week   Frequency of Social Gatherings with Friends and Family: Once a week   Attends Religious Services: More than 4 times per year   Active Member of [employer] or Organizations: Yes   Attends Club or  Organization Meetings: More than 4 times per year   Marital Status: Married  Human resources officer Violence: Not At Risk   Fear of Current or Ex-Partner: No   Emotionally Abused: No   Physically Abused: No   Sexually Abused: No    Family History  Problem Relation Age of Onset   Coronary artery disease Mother 26   Coronary artery disease Father 19   Cancer - Ovarian Sister    Colon cancer Cousin        x 2   Esophageal cancer Neg Hx    Rectal cancer Neg Hx    Stomach cancer Neg Hx     Past Medical History:  Diagnosis Date   Alcohol use    Anemia    Anxiety    Asthma    Basal cell carcinoma    forehead   Breast cancer ([place]) 1998   left    Colon polyps    GERD (gastroesophageal reflux disease)    Glaucoma    Hyperlipidemia    Hypertension    Hyponatremia    remote, mild   Mitral valve prolapse    Osteoarthritis    sees rheumatologist   Osteoporosis    sees rheumatologist   Personal history of radiation therapy 1998   PMR (polymyalgia rheumatica) ([place])    sees rheumatologist   Pneumonia    Prinzmetal angina ([place])    White matter changes    ? ischemic sm vs per pt    Past  Surgical History:  Procedure Laterality Date   ABDOMINAL HYSTERECTOMY     BREAST LUMPECTOMY Left 1998   cancer   CESAREAN SECTION     x 4   COLONOSCOPY  07/2012   in [place]  2005   abdominal hernia repair   HYSTEROTOMY     LYMPH NODE DISSECTION Left    POLYPECTOMY      Current Outpatient Medications  Medication Sig Dispense Refill   Alirocumab (PRALUENT) 150 MG/ML SOAJ Inject 1 pen into the skin every 14 (fourteen) days. 6 mL 3   amLODipine (NORVASC) 5 MG tablet TAKE 1 TABLET BY MOUTH EVERY DAY 90 tablet 1   aspirin 81 MG tablet Take 81 mg by mouth daily.     cetirizine (ZYRTEC) 10 MG tablet Take 10 mg by mouth 2 (two) times daily.      denosumab (PROLIA) 60 MG/ML SOLN injection Inject 60 mg into the skin every 6 (six) months. Administer in upper arm, thigh, or abdomen     famotidine (PEPCID) 20 MG tablet Take 20 mg by mouth at bedtime.     furosemide (LASIX) 20 MG tablet Take 20 mg by mouth daily.     LORazepam (ATIVAN) 1 MG tablet Take 1 mg by mouth at bedtime as needed (spasms).     MAGNESIUM PO Take 400 mg by mouth every 3 (three) days.     omeprazole (PRILOSEC) 20 MG capsule Take 20 mg by mouth daily.     VITAMIN D PO Take by mouth.     sucralfate (CARAFATE) 1 g tablet Take 1 tablet (1 g total) by mouth 2 (two) times daily for 10 days. 20 tablet 0   No current facility-administered medications for this visit.    Allergies as of 07/23/2020 - Review Complete 07/23/2020  Allergen Reaction Noted   Tylenol [acetaminophen] Cough 07/15/2015   Turmeric  04/10/2017   Erythromycin  05/06/2016   Erythromycin base Other (See Comments)    Other  05/06/2016   Sulfa antibiotics Other (See Comments) 05/06/2016    Vitals: BP (!) 146/82 (BP Location: Right Arm, Patient Position: Sitting)   Pulse 81   Ht 4' 8.25" (1.429 m)   Wt 95 lb (43.1 kg)   BMI 21.11 kg/m  Last Weight:  Wt Readings from Last 1 Encounters:  07/23/20 95 lb (43.1 kg)   Last Height:   Ht  Readings from Last 1 Encounters:  07/23/20 4' 8.25" (1.429 m)   Physical exam: Exam: Gen: NAD, conversant, well nourised, obese, well groomed                     CV: RRR, no MRG. No Carotid Bruits. No peripheral edema, warm, nontender Eyes: Conjunctivae clear without exudates or hemorrhage  Neuro: Detailed Neurologic Exam  Speech:    Speech is normal; fluent and spontaneous with normal comprehension.  Cognition:    The patient is oriented to person, place, and time;     recent and remote memory intact;     language fluent;     normal attention, concentration,     fund of knowledge Cranial Nerves:    The pupils are equal, round, and reactive to light. Visual fields are full to . Extraocular movements are intact. Trigeminal sensation is impaired on the left, the muscles of mastication are normal. Mild nasolabial flattening on the left but symmetric on smile may be normal variant. Bilateral L>R ptosis. The palate elevates in the midline. Hearing intact o voice. Voice is normal. Shoulder shrug is normal. The tongue has normal motion without fasciculations.   Coordination:   Low clearance but not ataxic or parkinsonian  Gait:    Heel-toe and tandem gait are intact with imbalance but did well surprisingly with tandem. She can get up without using hands. Her gait is cautious and she keeps her arms straight out to help with balance.   Motor Observation:    No asymmetry, no atrophy, and no involuntary movements noted. Tone:    Normal muscle tone.    Posture:    Posture is normal. normal erect    Strength: Very difficult motor exam, poor effort vs pain and giveway possibly arthritic pain, no focal weakness but generalized 4-4+ (stable, no changes from last exam)        Reflex Exam:  DTR's:    Deep tendon reflexes in the upper and lower extremities are symmetrical and 1-2+ bilaterally.   Toes:    The toes are down equiv going bilaterally.   Clonus:    Clonus is  absent.    Assessment/Plan:  Patient with multiple unexolained neurologic complaints including chronic dizzinnes, vertigo, ataxia - MRI brain and cervical spine has been completed. We can image her thoradici/lumbar to look for spinal lesions or meylopathy. And as far as the dizziness goes she has been extensively tested by neurology and ENT, at this point we can try and fins a neuro-otologist.  MRI of the brain w/wo contrast: I reviewed MRI of the brain and MR with trigeminal protocol with patient which showed mild atrophy and mild chronic microvascular changes which are normal for age.  Discussed these in general, pathophysiology and the brain atrophy and these white matter changes can be normal and aging.  Patient continues to have worries about her short-term memory loss.  At this time can considerformal neurocognitive testing.  I spent over 40 minutes of face-to-face and non-face-to-face time with patient on the  1. Ataxia   2. Gait abnormality  3. Chronic midline low back pain with bilateral sciatica   4. Chronic midline thoracic back pain   5. Imbalance   6. Dizziness    diagnosis.  This included previsit chart review, lab review, study review, order entry, electronic health record documentation, patient education on the different diagnostic and therapeutic options, counseling and coordination of care, risks and benefits of management, compliance, or risk factor reduction   Sarina Ill, MD  [facility] [address] [place], [place] [address]  Phone [phone] Fax [phone]

## 2020-07-28 ENCOUNTER — Encounter: Payer: Self-pay | Admitting: Neurology

## 2020-07-29 ENCOUNTER — Ambulatory Visit
Admission: RE | Admit: 2020-07-29 | Discharge: 2020-07-29 | Disposition: A | Discharge status: Home / Self Care | Payer: Medicare Other | Source: Ambulatory Visit | Attending: Neurology | Admitting: Neurology

## 2020-07-29 ENCOUNTER — Other Ambulatory Visit: Payer: Self-pay

## 2020-07-29 DIAGNOSIS — R269 Unspecified abnormalities of gait and mobility: Secondary | ICD-10-CM

## 2020-07-29 DIAGNOSIS — R2689 Other abnormalities of gait and mobility: Secondary | ICD-10-CM

## 2020-07-29 DIAGNOSIS — R270 Ataxia, unspecified: Secondary | ICD-10-CM

## 2020-07-29 DIAGNOSIS — G8929 Other chronic pain: Secondary | ICD-10-CM

## 2020-07-29 DIAGNOSIS — M5441 Lumbago with sciatica, right side: Secondary | ICD-10-CM

## 2020-07-29 DIAGNOSIS — M546 Pain in thoracic spine: Secondary | ICD-10-CM

## 2020-07-29 DIAGNOSIS — M5442 Lumbago with sciatica, left side: Secondary | ICD-10-CM

## 2020-08-03 ENCOUNTER — Other Ambulatory Visit: Payer: Self-pay | Admitting: Neurology

## 2020-08-03 DIAGNOSIS — H832X3 Labyrinthine dysfunction, bilateral: Secondary | ICD-10-CM

## 2020-08-12 DIAGNOSIS — R490 Dysphonia: Secondary | ICD-10-CM | POA: Diagnosis not present

## 2020-08-12 DIAGNOSIS — K219 Gastro-esophageal reflux disease without esophagitis: Secondary | ICD-10-CM | POA: Diagnosis not present

## 2020-08-14 ENCOUNTER — Ambulatory Visit
Admission: RE | Admit: 2020-08-14 | Discharge: 2020-08-14 | Disposition: A | Discharge status: Home / Self Care | Payer: Medicare Other | Source: Ambulatory Visit | Attending: Family Medicine | Admitting: Family Medicine

## 2020-08-14 ENCOUNTER — Other Ambulatory Visit: Payer: Self-pay

## 2020-08-14 DIAGNOSIS — Z1231 Encounter for screening mammogram for malignant neoplasm of breast: Secondary | ICD-10-CM | POA: Diagnosis not present

## 2020-08-24 ENCOUNTER — Ambulatory Visit: Payer: Medicare Other

## 2020-08-24 DIAGNOSIS — Z90710 Acquired absence of both cervix and uterus: Secondary | ICD-10-CM | POA: Diagnosis not present

## 2020-08-24 DIAGNOSIS — Z853 Personal history of malignant neoplasm of breast: Secondary | ICD-10-CM | POA: Diagnosis not present

## 2020-08-24 DIAGNOSIS — Z9079 Acquired absence of other genital organ(s): Secondary | ICD-10-CM | POA: Diagnosis not present

## 2020-08-24 DIAGNOSIS — Z01419 Encounter for gynecological examination (general) (routine) without abnormal findings: Secondary | ICD-10-CM | POA: Diagnosis not present

## 2020-08-24 DIAGNOSIS — Z90722 Acquired absence of ovaries, bilateral: Secondary | ICD-10-CM | POA: Diagnosis not present

## 2020-08-25 DIAGNOSIS — F320 Major depressive disorder, single episode, mild: Secondary | ICD-10-CM | POA: Diagnosis not present

## 2020-08-27 ENCOUNTER — Encounter: Payer: Self-pay | Admitting: Family Medicine

## 2020-09-08 DIAGNOSIS — Z961 Presence of intraocular lens: Secondary | ICD-10-CM | POA: Diagnosis not present

## 2020-09-08 DIAGNOSIS — H40053 Ocular hypertension, bilateral: Secondary | ICD-10-CM | POA: Diagnosis not present

## 2020-09-08 DIAGNOSIS — H524 Presbyopia: Secondary | ICD-10-CM | POA: Diagnosis not present

## 2020-10-06 ENCOUNTER — Ambulatory Visit (HOSPITAL_BASED_OUTPATIENT_CLINIC_OR_DEPARTMENT_OTHER): Payer: Medicare Other | Admitting: Nurse Practitioner

## 2020-10-06 ENCOUNTER — Telehealth (INDEPENDENT_AMBULATORY_CARE_PROVIDER_SITE_OTHER): Payer: Medicare Other | Admitting: Family Medicine

## 2020-10-06 ENCOUNTER — Other Ambulatory Visit: Payer: Self-pay

## 2020-10-06 DIAGNOSIS — B9689 Other specified bacterial agents as the cause of diseases classified elsewhere: Secondary | ICD-10-CM

## 2020-10-06 DIAGNOSIS — H109 Unspecified conjunctivitis: Secondary | ICD-10-CM

## 2020-10-06 DIAGNOSIS — N2581 Secondary hyperparathyroidism of renal origin: Secondary | ICD-10-CM | POA: Diagnosis not present

## 2020-10-06 DIAGNOSIS — J320 Chronic maxillary sinusitis: Secondary | ICD-10-CM

## 2020-10-06 DIAGNOSIS — N182 Chronic kidney disease, stage 2 (mild): Secondary | ICD-10-CM | POA: Diagnosis not present

## 2020-10-06 MED ORDER — AMOXICILLIN 875 MG PO TABS: 875.0000 mg | ORAL_TABLET | Freq: Two times a day (BID) | ORAL | 0 refills | Status: AC

## 2020-10-06 MED ORDER — POLYMYXIN B-TRIMETHOPRIM 10000-0.1 UNIT/ML-% OP SOLN: 2.0000 [drp] | OPHTHALMIC | 0 refills | Status: DC

## 2020-10-06 NOTE — Progress Notes (Signed)
Patient ID: TYRAN NIX, female   DOB: May 28, 1945, 75 y.o.   MRN: [id number]   This visit type was conducted due to national recommendations for restrictions regarding the COVID-19 pandemic in an effort to limit this patient's exposure and mitigate transmission in our community.   Virtual Visit via Video Note  I connected with Elynor Chumney on 10/06/20 at  1:30 PM EDT by a video enabled telemedicine application and verified that I am speaking with the correct person using two identifiers.  Location patient: home Location provider:work or home office Persons participating in the virtual visit: patient, provider  I discussed the limitations of evaluation and management by telemedicine and the availability of in person appointments. The patient expressed understanding and agreed to proceed.   HPI:  Ms. Mcgillis relates over 1 week history of sinus congestion and especially right maxillary facial pressure and pain.  She has had some yellowish nasal discharge.  Home COVID test negative.  She was around her great grandson recently who had some respiratory symptoms.  She has had some bilateral crusted eye symptoms past few days.  Some cough.  Advil helps some with her headaches and facial pain.  No bloody discharge.  No blurred vision.  Crusted eyes symptoms are especially worse in the morning and right eye involvement greater than left.  She has some itching in both eyes.  Increased malaise but no documented fever.  ROS: See pertinent positives and negatives per HPI.  Past Medical History:  Diagnosis Date   Alcohol use    Anemia    Anxiety    Asthma    Basal cell carcinoma    forehead   Breast cancer ([place]) 1998   left    Colon polyps    GERD (gastroesophageal reflux disease)    Glaucoma    Hyperlipidemia    Hypertension    Hyponatremia    remote, mild   Mitral valve prolapse    Osteoarthritis    sees rheumatologist   Osteoporosis    sees rheumatologist   Personal  history of radiation therapy 1998   PMR (polymyalgia rheumatica) ([place])    sees rheumatologist   Pneumonia    Prinzmetal angina ([place])    White matter changes    ? ischemic sm vs per pt    Past Surgical History:  Procedure Laterality Date   ABDOMINAL HYSTERECTOMY     BREAST LUMPECTOMY Left 1998   cancer   CESAREAN SECTION     x 4   COL[facility]OSCOPY  07/2012   in [facility]   HERNIA REPAIR  2005   abdominal hernia repair   HYSTEROTOMY     LYMPH NODE DISSECTION Left    POLYPECTOMY      Family History  Problem Relation Age of Onset   Coronary artery disease Mother 89   Coronary artery disease Father 15   Cancer - Ovarian Sister    Colon cancer Cousin        x 2   Esophageal cancer Neg Hx    Rectal cancer Neg Hx    Stomach cancer Neg Hx     SOCIAL HX: Former smoker.  Quit way back in 1973.   Current Outpatient Medications:    Alirocumab (PRALUENT) 150 MG/ML SOAJ, Inject 1 pen into the skin every 14 (fourteen) days., Disp: 6 mL, Rfl: 3   amLODipine (NORVASC) 5 MG tablet, TAKE 1 TABLET BY MOUTH EVERY DAY, Disp: 90 tablet, Rfl: 1   aspirin 81 MG tablet, Take 81 mg  by mouth daily., Disp: , Rfl:    cetirizine (ZYRTEC) 10 MG tablet, Take 10 mg by mouth 2 (two) times daily. , Disp: , Rfl:    denosumab (PROLIA) 60 MG/ML SOLN injection, Inject 60 mg into the skin every 6 (six) months. Administer in upper arm, thigh, or abdomen, Disp: , Rfl:    famotidine (PEPCID) 20 MG tablet, Take 20 mg by mouth at bedtime., Disp: , Rfl:    furosemide (LASIX) 20 MG tablet, Take 20 mg by mouth daily., Disp: , Rfl:    LORazepam (ATIVAN) 1 MG tablet, Take 1 mg by mouth at bedtime as needed (spasms)., Disp: , Rfl:    MAGNESIUM PO, Take 400 mg by mouth every 3 (three) days., Disp: , Rfl:    VITAMIN D PO, Take by mouth., Disp: , Rfl:    sucralfate (CARAFATE) 1 g tablet, Take 1 tablet (1 g total) by mouth 2 (two) times daily for 10 days., Disp: 20 tablet, Rfl: 0  EXAM:  VITALS per patient if  applicable:  GENERAL: alert, oriented, appears well and in no acute distress  HEENT: atraumatic, conjunttiva clear, no obvious abnormalities on inspection of external nose and ears  NECK: normal movements of the head and neck  LUNGS: on inspection no signs of respiratory distress, breathing rate appears normal, no obvious gross SOB, gasping or wheezing  CV: no obvious cyanosis  MS: moves all visible extremities without noticeable abnormality  PSYCH/NEURO: pleasant and cooperative, no obvious depression or anxiety, speech and thought processing grossly intact  ASSESSMENT AND PLAN:  Discussed the following assessment and plan:   Probable acute right maxillary sinusitis and bilateral conjunctivitis  -Amoxicillin 875 mg twice daily for 10 days -Warm compresses to both eyes several times daily -Polytrim ophthalmic drops 2 drops each eye every 4 hours while awake and be in touch if symptoms not improving over the next couple days    I discussed the assessment and treatment plan with the patient. The patient was provided an opportunity to ask questions and all were answered. The patient agreed with the plan and demonstrated an understanding of the instructions.   The patient was advised to call back or seek an in-person evaluation if the symptoms worsen or if the condition fails to improve as anticipated.     Carolann Littler, MD

## 2020-10-07 DIAGNOSIS — K219 Gastro-esophageal reflux disease without esophagitis: Secondary | ICD-10-CM | POA: Diagnosis not present

## 2020-10-07 DIAGNOSIS — R490 Dysphonia: Secondary | ICD-10-CM | POA: Diagnosis not present

## 2020-10-07 DIAGNOSIS — H6121 Impacted cerumen, right ear: Secondary | ICD-10-CM | POA: Diagnosis not present

## 2020-10-09 ENCOUNTER — Encounter: Payer: Self-pay | Admitting: Family Medicine

## 2020-10-09 DIAGNOSIS — M810 Age-related osteoporosis without current pathological fracture: Secondary | ICD-10-CM | POA: Diagnosis not present

## 2020-10-09 DIAGNOSIS — M25551 Pain in right hip: Secondary | ICD-10-CM | POA: Diagnosis not present

## 2020-10-09 DIAGNOSIS — M353 Polymyalgia rheumatica: Secondary | ICD-10-CM | POA: Diagnosis not present

## 2020-10-09 DIAGNOSIS — M1990 Unspecified osteoarthritis, unspecified site: Secondary | ICD-10-CM | POA: Diagnosis not present

## 2020-10-09 DIAGNOSIS — Z6820 Body mass index (BMI) 20.0-20.9, adult: Secondary | ICD-10-CM | POA: Diagnosis not present

## 2020-10-09 DIAGNOSIS — M654 Radial styloid tenosynovitis [de Quervain]: Secondary | ICD-10-CM | POA: Diagnosis not present

## 2020-10-09 DIAGNOSIS — M150 Primary generalized (osteo)arthritis: Secondary | ICD-10-CM | POA: Diagnosis not present

## 2020-10-09 DIAGNOSIS — M5030 Other cervical disc degeneration, unspecified cervical region: Secondary | ICD-10-CM | POA: Diagnosis not present

## 2020-10-12 DIAGNOSIS — H6521 Chronic serous otitis media, right ear: Secondary | ICD-10-CM | POA: Diagnosis not present

## 2020-10-12 DIAGNOSIS — H9071 Mixed conductive and sensorineural hearing loss, unilateral, right ear, with unrestricted hearing on the contralateral side: Secondary | ICD-10-CM | POA: Diagnosis not present

## 2020-10-12 DIAGNOSIS — H6981 Other specified disorders of Eustachian tube, right ear: Secondary | ICD-10-CM | POA: Diagnosis not present

## 2020-10-12 DIAGNOSIS — H838X1 Other specified diseases of right inner ear: Secondary | ICD-10-CM | POA: Diagnosis not present

## 2020-10-13 DIAGNOSIS — N2581 Secondary hyperparathyroidism of renal origin: Secondary | ICD-10-CM | POA: Diagnosis not present

## 2020-10-13 DIAGNOSIS — E871 Hypo-osmolality and hyponatremia: Secondary | ICD-10-CM | POA: Diagnosis not present

## 2020-10-13 DIAGNOSIS — N182 Chronic kidney disease, stage 2 (mild): Secondary | ICD-10-CM | POA: Diagnosis not present

## 2020-10-14 NOTE — Progress Notes (Signed)
Cardiology Office Note:    Date:  10/15/2020   ID:  Amanda Cannon, DOB [birth year], MRN [id number]  PCP:  [name], MD  Cardiologist:  Sinclair Grooms, MD   Referring MD: [name], MD   Chief Complaint  Patient presents with   Coronary Artery Disease     History of Present Illness:    Amanda Cannon is a 75 y.o. female with a hx of hypertension, polymyalgia rheumatica. hyperlipidemia, Prinzmetal's Angina, and less than 39% bilateral carotid obstruction 2018.     She denies shortness of breath.  She as well as her husband have been exposed to a viral syndrome via her grandchildren.  He has been sick for over 2 weeks.  They have coughing and sniffling.  They are COVID-negative x2.  She is on a prednisone Dosepak.  She denies chest pressure consistent with angina.  Very high lipid levels are now being well controlled with Praluent.  She is tolerating the Praluent without difficulty.   Past Medical History:  Diagnosis Date   Alcohol use    Anemia    Anxiety    Asthma    Basal cell carcinoma    forehead   Breast cancer ([place]) 1998   left    Colon polyps    GERD (gastroesophageal reflux disease)    Glaucoma    Hyperlipidemia    Hypertension    Hyponatremia    remote, mild   Mitral valve prolapse    Osteoarthritis    sees rheumatologist   Osteoporosis    sees rheumatologist   Personal history of radiation therapy 1998   PMR (polymyalgia rheumatica) ([place])    sees rheumatologist   Pneumonia    Prinzmetal angina ([place])    White matter changes    ? ischemic sm vs per pt    Past Surgical History:  Procedure Laterality Date   ABDOMINAL HYSTERECTOMY     BREAST LUMPECTOMY Left 1998   cancer   CESAREAN SECTION     x 4   COLONOSCOPY  07/2012   in [place]  2005   abdominal hernia repair   HYSTEROTOMY     LYMPH NODE DISSECTION Left    POLYPECTOMY      Current Medications: Current Meds  Medication Sig   Alirocumab  (PRALUENT) 150 MG/ML SOAJ Inject 1 pen into the skin every 14 (fourteen) days.   amLODipine (NORVASC) 5 MG tablet TAKE 1 TABLET BY MOUTH EVERY DAY   amoxicillin (AMOXIL) 875 MG tablet Take 1 tablet (875 mg total) by mouth 2 (two) times daily for 10 days.   aspirin 81 MG tablet Take 81 mg by mouth daily.   cetirizine (ZYRTEC) 10 MG tablet Take 10 mg by mouth 2 (two) times daily.    denosumab (PROLIA) 60 MG/ML SOLN injection Inject 60 mg into the skin every 6 (six) months. Administer in upper arm, thigh, or abdomen   famotidine (PEPCID) 20 MG tablet Take 20 mg by mouth 2 (two) times daily.   fluticasone (FLONASE) 50 MCG/ACT nasal spray Place 2 sprays into both nostrils daily.   furosemide (LASIX) 20 MG tablet Take 20 mg by mouth daily.   LORazepam (ATIVAN) 1 MG tablet Take 1 mg by mouth at bedtime as needed (spasms).   MAGNESIUM PO Take 400 mg by mouth every 3 (three) days.   predniSONE (STERAPRED UNI-PAK 21 TAB) 10 MG (21) TBPK tablet See admin instructions.   VITAMIN D PO Take  by mouth.     Allergies:   Tylenol [acetaminophen], Turmeric, Erythromycin, Erythromycin base, Other, and Sulfa antibiotics   Social History   Socioeconomic History   Marital status: Married    Spouse name: Amanda Cannon Resides    Number of children: 3   Years of education: 13   Highest education level: Some college, no degree  Occupational History   Occupation: Psychologist, occupational  Tobacco Use   Smoking status: Former    Packs/day: 1.00    Years: 7.00    Pack years: 7.00    Types: Cigarettes    Quit date: 01/25/1971    Years since quitting: 49.7   Smokeless tobacco: Never  Vaping Use   Vaping Use: Never used  Substance and Sexual Activity   Alcohol use: Yes    Alcohol/week: 14.0 standard drinks    Types: 14 Standard drinks or equivalent per week    Comment: two drinks with burbon + water daily    Drug use: No   Sexual activity: Yes  Other Topics Concern   Not on file  Social History Narrative   Work or School: retired  Geophysicist/field seismologist and retired Futures trader Situation: lives with husband      Spiritual Beliefs: Jewish      Lifestyle: active, diet is good      3 children + grandchildren   1 cat    Social Determinants of Radio broadcast assistant Strain: Low Risk    Difficulty of Paying Living Expenses: Not hard at all  Food Insecurity: No Food Insecurity   Worried About Charity fundraiser in the Last Year: Never true   Arboriculturist in the Last Year: Never true  Transportation Needs: No Transportation Needs   Lack of Transportation (Medical): No   Lack of Transportation (Non-Medical): No  Physical Activity: Inactive   Days of Exercise per Week: 0 days   Minutes of Exercise per Session: 0 min  Stress: No Stress Concern Present   Feeling of Stress : Not at all  Social Connections: Socially Integrated   Frequency of Communication with Friends and Family: More than three times a week   Frequency of Social Gatherings with Friends and Family: Once a week   Attends Religious Services: More than 4 times per year   Active Member of [employer] or Organizations: Yes   Attends Music therapist: More than 4 times per year   Marital Status: Married     Family History: The patient's family history includes Cancer - Ovarian in her sister; Colon cancer in her cousin; Coronary artery disease (age of onset: 62) in her father; Coronary artery disease (age of onset: 45) in her mother. There is no history of Esophageal cancer, Rectal cancer, or Stomach cancer.  ROS:   Please see the history of present illness.    2-week ongoing URI.  Laryngeal inflammation identified by Dr. Melene Plan.  Does not feel that Pepcid is helping.  A vestibular problem has been diagnosed as well and will be managed at [facility].  All other systems reviewed and are negative.  EKGs/Labs/Other Studies Reviewed:    The following studies were reviewed today: No new or recent cardiac  imaging  EKG:  EKG normal sinus rhythm with normal EKG appearance.  Recent Labs: 06/05/2020: ALT 19; BUN 20; Creatinine, Ser 0.77; Hemoglobin 12.6; Platelets 250.0; Potassium 4.6; Sodium 131  Recent Lipid Panel    Component Value Date/Time  CHOL 216 (H) 09/12/2018 1013   TRIG 137 09/12/2018 1013   TRIG 174 (H) 06/24/2016 0848   HDL 103 09/12/2018 1013   HDL 74 06/24/2016 0848   CHOLHDL 2.1 09/12/2018 1013   LDLCALC 86 09/12/2018 1013    Physical Exam:    VS:  BP 126/74   Pulse 82   Ht 4' 8.5" (1.435 m)   Wt 94 lb 9.6 oz (42.9 kg)   SpO2 99%   BMI 20.84 kg/m     Wt Readings from Last 3 Encounters:  10/15/20 94 lb 9.6 oz (42.9 kg)  07/23/20 95 lb (43.1 kg)  06/05/20 96 lb 3.2 oz (43.6 kg)     GEN: Small/petite and in no acute distress. HEENT: Normal NECK: No JVD. LYMPHATICS: No lymphadenopathy CARDIAC: Soft systolic murmur with click.  Prior echo however does not demonstrate evidence of mitral valve prolapse.. RRR no gallop, or edema. VASCULAR:  Normal Pulses. No bruits. RESPIRATORY:  Clear to auscultation without rales, wheezing or rhonchi  ABDOMEN: Soft, non-tender, non-distended, No pulsatile mass, MUSCULOSKELETAL: No deformity  SKIN: Warm and dry NEUROLOGIC:  Alert and oriented x 3 PSYCHIATRIC:  Normal affect   ASSESSMENT:    1. Prinzmetal angina (HCC)   2. Mitral valve prolapse   3. Essential hypertension   4. Other hyperlipidemia   5. Bilateral carotid artery stenosis    PLAN:    In order of problems listed above:  This is an uncertain diagnosis.  We do not have any confirmatory evidence of ST segment elevation associated with chest pain.  She does not use nitroglycerin. No definite diagnosis of prolapse although her exam today is somewhat suggestive. Excellent blood pressure control. Continue Praluent.  Excellent control currently. Follow expectantly.   Primary prevention discussed.   Medication Adjustments/Labs and Tests Ordered: Current  medicines are reviewed at length with the patient today.  Concerns regarding medicines are outlined above.  Orders Placed This Encounter  Procedures   EKG 12-Lead    No orders of the defined types were placed in this encounter.   Patient Instructions  Medication Instructions:  Your physician recommends that you continue on your current medications as directed. Please refer to the Current Medication list given to you today.  *If you need a refill on your cardiac medications before your next appointment, please call your pharmacy*   Lab Work: None If you have labs (blood work) drawn today and your tests are completely normal, you will receive your results only by: [place] (if you have MyChart) OR A paper copy in the mail If you have any lab test that is abnormal or we need to change your treatment, we will call you to review the results.   Testing/Procedures: None   Follow-Up: At [facility], you and your health needs are our priority.  As part of our continuing mission to provide you with exceptional heart care, we have created designated Provider Care Teams.  These Care Teams include your primary Cardiologist (physician) and Advanced Practice Providers (APPs -  Physician Assistants and Nurse Practitioners) who all work together to provide you with the care you need, when you need it.  We recommend signing up for the patient portal called "MyChart".  Sign up information is provided on this After Visit Summary.  MyChart is used to connect with patients for Virtual Visits (Telemedicine).  Patients are able to view lab/test results, encounter notes, upcoming appointments, etc.  Non-urgent messages can be sent to your provider as well.  To learn more about what you can do with MyChart, go to [email or web address].    Your next appointment:   1 year(s)  The format for your next appointment:   In Person  Provider:   You may see Sinclair Grooms, MD or one of the  following Advanced Practice Providers on your designated Care Team:   Cecilie Kicks, NP   Other Instructions     Signed, Sinclair Grooms, MD  10/15/2020 5:16 PM    [place]

## 2020-10-15 ENCOUNTER — Ambulatory Visit (INDEPENDENT_AMBULATORY_CARE_PROVIDER_SITE_OTHER): Payer: Medicare Other | Admitting: Interventional Cardiology

## 2020-10-15 ENCOUNTER — Other Ambulatory Visit: Payer: Self-pay

## 2020-10-15 ENCOUNTER — Encounter: Payer: Self-pay | Admitting: Interventional Cardiology

## 2020-10-15 VITALS — BP 126/74 | HR 82 | Ht <= 58 in | Wt 94.6 lb

## 2020-10-15 DIAGNOSIS — I201 Angina pectoris with documented spasm: Secondary | ICD-10-CM

## 2020-10-15 DIAGNOSIS — I6523 Occlusion and stenosis of bilateral carotid arteries: Secondary | ICD-10-CM

## 2020-10-15 DIAGNOSIS — I10 Essential (primary) hypertension: Secondary | ICD-10-CM

## 2020-10-15 DIAGNOSIS — E7849 Other hyperlipidemia: Secondary | ICD-10-CM | POA: Diagnosis not present

## 2020-10-15 DIAGNOSIS — I341 Nonrheumatic mitral (valve) prolapse: Secondary | ICD-10-CM

## 2020-10-15 NOTE — Patient Instructions (Signed)

## 2020-10-20 ENCOUNTER — Telehealth (INDEPENDENT_AMBULATORY_CARE_PROVIDER_SITE_OTHER): Payer: Medicare Other | Admitting: Family Medicine

## 2020-10-20 ENCOUNTER — Encounter: Payer: Self-pay | Admitting: Family Medicine

## 2020-10-20 VITALS — Temp 97.4°F

## 2020-10-20 DIAGNOSIS — R0981 Nasal congestion: Secondary | ICD-10-CM | POA: Diagnosis not present

## 2020-10-20 DIAGNOSIS — I201 Angina pectoris with documented spasm: Secondary | ICD-10-CM | POA: Diagnosis not present

## 2020-10-20 NOTE — Patient Instructions (Signed)
-  nasal saline twice daily   I hope you are feeling better soon!  Seek in person care promptly if your symptoms worsen, new concerns arise or you are not improving with treatment.  It was nice to meet you today. I help [place] out with telemedicine visits on Tuesdays and Thursdays and am available for visits on those days. If you have any concerns or questions following this visit please schedule a follow up visit with your Primary Care doctor or seek care at a local urgent care clinic to avoid delays in care.

## 2020-10-20 NOTE — Progress Notes (Signed)
Virtual Visit via Video Note  I connected with Amanda Cannon  on 10/20/20 at 11:00 AM EDT by a video enabled telemedicine application and verified that I am speaking with the correct person using two identifiers.  Location patient: home, [place] Location provider:work or home office Persons participating in the virtual visit: patient, provider, pt's husband  I discussed the limitations of evaluation and management by telemedicine and the availability of in person appointments. The patient expressed understanding and agreed to proceed.   HPI:  Acute telemedicine visit for sinus issues: -Onset: about 2-3 weeks ago after being around grandchild who was sick -has had abx from PCP and saw ENT and did steroids -Symptoms include: sinus congestion, still feels like hearing and R ear fullness as not resolved, rare cough, pnd -Denies: fever, CP, SOB, severe headache -Pertinent past medical history:see below -Pertinent medication allergies:   ROS: See pertinent positives and negatives per HPI.  Past Medical History:  Diagnosis Date   Alcohol use    Anemia    Anxiety    Asthma    Basal cell carcinoma    forehead   Breast cancer ([place]) 1998   left    Colon polyps    GERD (gastroesophageal reflux disease)    Glaucoma    Hyperlipidemia    Hypertension    Hyponatremia    remote, mild   Mitral valve prolapse    Osteoarthritis    sees rheumatologist   Osteoporosis    sees rheumatologist   Personal history of radiation therapy 1998   PMR (polymyalgia rheumatica) ([place])    sees rheumatologist   Pneumonia    Prinzmetal angina ([place])    White matter changes    ? ischemic sm vs per pt    Past Surgical History:  Procedure Laterality Date   ABDOMINAL HYSTERECTOMY     BREAST LUMPECTOMY Left 1998   cancer   CESAREAN SECTION     x 4   COLONOSCOPY  07/2012   in [place]  2005   abdominal hernia repair   HYSTEROTOMY     LYMPH NODE DISSECTION Left    POLYPECTOMY       Current  Outpatient Medications:    Alirocumab (PRALUENT) 150 MG/ML SOAJ, Inject 1 pen into the skin every 14 (fourteen) days., Disp: 6 mL, Rfl: 3   amLODipine (NORVASC) 5 MG tablet, TAKE 1 TABLET BY MOUTH EVERY DAY, Disp: 90 tablet, Rfl: 1   aspirin 81 MG tablet, Take 81 mg by mouth daily., Disp: , Rfl:    cetirizine (ZYRTEC) 10 MG tablet, Take 10 mg by mouth 2 (two) times daily. , Disp: , Rfl:    denosumab (PROLIA) 60 MG/ML SOLN injection, Inject 60 mg into the skin every 6 (six) months. Administer in upper arm, thigh, or abdomen, Disp: , Rfl:    famotidine (PEPCID) 20 MG tablet, Take 20 mg by mouth 2 (two) times daily., Disp: , Rfl:    fluticasone (FLONASE) 50 MCG/ACT nasal spray, Place 2 sprays into both nostrils daily., Disp: , Rfl:    furosemide (LASIX) 20 MG tablet, Take 20 mg by mouth daily., Disp: , Rfl:    LORazepam (ATIVAN) 1 MG tablet, Take 1 mg by mouth at bedtime as needed (spasms)., Disp: , Rfl:    MAGNESIUM PO, Take 400 mg by mouth every 3 (three) days., Disp: , Rfl:    Misc Natural Products (NEURIVA PO), Take by mouth daily., Disp: , Rfl:    PANTOPRAZOLE SODIUM PO, Take  by mouth., Disp: , Rfl:    VITAMIN D PO, Take by mouth., Disp: , Rfl:   EXAM:  VITALS per patient if applicable:  GENERAL: alert, oriented, appears well and in no acute distress  HEENT: atraumatic, conjunttiva clear, no obvious abnormalities on inspection of external nose and ears  NECK: normal movements of the head and neck  LUNGS: on inspection no signs of respiratory distress, breathing rate appears normal, no obvious gross SOB, gasping or wheezing  CV: no obvious cyanosis  MS: moves all visible extremities without noticeable abnormality  PSYCH/NEURO: pleasant and cooperative, no obvious depression or anxiety, speech and thought processing grossly intact  ASSESSMENT AND PLAN:  Discussed the following assessment and plan:  Nasal sinus congestion  -we discussed possible serious and likely etiologies,  options for evaluation and workup, limitations of telemedicine visit vs in person visit, treatment, treatment risks and precautions. Pt is agreeable to treatment via telemedicine at this moment. Possible VURI with ETD vs other. She has been treated with a steroid and an antibiotic which she does not feel helped. Denies any severe symptoms. She has opted to try twice daily nasal saline and a short course of nasal decongestant. Advised to seek prompt in person care if worsening, new symptoms arise, or if is not improving with treatment. Discussed options for inperson care if PCP office not available. Did let this patient know that I only do telemedicine on Tuesdays and Thursdays for [place]. Advised to schedule follow up visit with PCP or [facility] if any further questions or concerns to avoid delays in care.   I discussed the assessment and treatment plan with the patient. The patient was provided an opportunity to ask questions and all were answered. The patient agreed with the plan and demonstrated an understanding of the instructions.     Amanda Kern, DO

## 2020-11-08 ENCOUNTER — Other Ambulatory Visit: Payer: Self-pay | Admitting: Family Medicine

## 2020-11-10 DIAGNOSIS — Z23 Encounter for immunization: Secondary | ICD-10-CM | POA: Diagnosis not present

## 2020-11-11 DIAGNOSIS — M810 Age-related osteoporosis without current pathological fracture: Secondary | ICD-10-CM | POA: Diagnosis not present

## 2020-11-12 DIAGNOSIS — F320 Major depressive disorder, single episode, mild: Secondary | ICD-10-CM | POA: Diagnosis not present

## 2020-11-18 DIAGNOSIS — H6983 Other specified disorders of Eustachian tube, bilateral: Secondary | ICD-10-CM | POA: Diagnosis not present

## 2020-11-18 DIAGNOSIS — H838X3 Other specified diseases of inner ear, bilateral: Secondary | ICD-10-CM | POA: Diagnosis not present

## 2020-11-18 DIAGNOSIS — H903 Sensorineural hearing loss, bilateral: Secondary | ICD-10-CM | POA: Diagnosis not present

## 2020-11-18 DIAGNOSIS — R42 Dizziness and giddiness: Secondary | ICD-10-CM | POA: Diagnosis not present

## 2020-11-25 ENCOUNTER — Encounter: Payer: Self-pay | Admitting: Family Medicine

## 2020-11-26 ENCOUNTER — Ambulatory Visit (HOSPITAL_BASED_OUTPATIENT_CLINIC_OR_DEPARTMENT_OTHER): Payer: Medicare Other | Admitting: Nurse Practitioner

## 2020-11-26 ENCOUNTER — Ambulatory Visit: Payer: PRIVATE HEALTH INSURANCE | Admitting: Allergy & Immunology

## 2020-12-02 ENCOUNTER — Other Ambulatory Visit: Payer: Self-pay | Admitting: Family Medicine

## 2020-12-02 DIAGNOSIS — E7849 Other hyperlipidemia: Secondary | ICD-10-CM

## 2020-12-02 DIAGNOSIS — I10 Essential (primary) hypertension: Secondary | ICD-10-CM

## 2020-12-02 DIAGNOSIS — M353 Polymyalgia rheumatica: Secondary | ICD-10-CM

## 2020-12-02 DIAGNOSIS — M810 Age-related osteoporosis without current pathological fracture: Secondary | ICD-10-CM

## 2020-12-02 DIAGNOSIS — E871 Hypo-osmolality and hyponatremia: Secondary | ICD-10-CM

## 2020-12-06 DIAGNOSIS — Z23 Encounter for immunization: Secondary | ICD-10-CM | POA: Diagnosis not present

## 2020-12-07 ENCOUNTER — Other Ambulatory Visit (INDEPENDENT_AMBULATORY_CARE_PROVIDER_SITE_OTHER): Payer: Medicare Other

## 2020-12-07 DIAGNOSIS — M353 Polymyalgia rheumatica: Secondary | ICD-10-CM | POA: Diagnosis not present

## 2020-12-07 DIAGNOSIS — E7849 Other hyperlipidemia: Secondary | ICD-10-CM

## 2020-12-07 DIAGNOSIS — E871 Hypo-osmolality and hyponatremia: Secondary | ICD-10-CM

## 2020-12-07 DIAGNOSIS — M810 Age-related osteoporosis without current pathological fracture: Secondary | ICD-10-CM | POA: Diagnosis not present

## 2020-12-07 DIAGNOSIS — I10 Essential (primary) hypertension: Secondary | ICD-10-CM | POA: Diagnosis not present

## 2020-12-07 LAB — LIPID PANEL
Cholesterol: 201 mg/dL — ABNORMAL HIGH (ref 0–200)
HDL: 116.9 mg/dL (ref >39.00)
LDL Cholesterol: 60 mg/dL (ref 0–99)
NonHDL: 84.04
Total CHOL/HDL Ratio: 2
Triglycerides: 118 mg/dL (ref 0.0–149.0)
VLDL: 23.6 mg/dL (ref 0.0–40.0)

## 2020-12-07 LAB — CBC WITH DIFFERENTIAL/PLATELET
Basophils Absolute: 0 10*3/uL (ref 0.0–0.1)
Basophils Relative: 0.5 % (ref 0.0–3.0)
Eosinophils Absolute: 0.1 10*3/uL (ref 0.0–0.7)
Eosinophils Relative: 1.2 % (ref 0.0–5.0)
HCT: 38.8 % (ref 36.0–46.0)
Hemoglobin: 13.1 g/dL (ref 12.0–15.0)
Lymphocytes Relative: 11.2 % — ABNORMAL LOW (ref 12.0–46.0)
Lymphs Abs: 0.6 10*3/uL — ABNORMAL LOW (ref 0.7–4.0)
MCHC: 33.9 g/dL (ref 30.0–36.0)
MCV: 97.7 fl (ref 78.0–100.0)
Monocytes Absolute: 0.6 10*3/uL (ref 0.1–1.0)
Monocytes Relative: 11.5 % (ref 3.0–12.0)
Neutro Abs: 3.9 10*3/uL (ref 1.4–7.7)
Neutrophils Relative %: 75.6 % (ref 43.0–77.0)
Platelets: 264 10*3/uL (ref 150.0–400.0)
RBC: 3.97 Mil/uL (ref 3.87–5.11)
RDW: 14.6 % (ref 11.5–15.5)
WBC: 5.1 10*3/uL (ref 4.0–10.5)

## 2020-12-07 LAB — COMPREHENSIVE METABOLIC PANEL
ALT: 20 U/L (ref 0–35)
AST: 33 U/L (ref 0–37)
Albumin: 5 g/dL (ref 3.5–5.2)
Alkaline Phosphatase: 78 U/L (ref 39–117)
BUN: 20 mg/dL (ref 6–23)
CO2: 27 mEq/L (ref 19–32)
Calcium: 10.7 mg/dL — ABNORMAL HIGH (ref 8.4–10.5)
Chloride: 95 mEq/L — ABNORMAL LOW (ref 96–112)
Creatinine, Ser: 0.81 mg/dL (ref 0.40–1.20)
GFR: 70.67 mL/min (ref >60.00)
Glucose, Bld: 101 mg/dL — ABNORMAL HIGH (ref 70–99)
Potassium: 4.3 mEq/L (ref 3.5–5.1)
Sodium: 134 mEq/L — ABNORMAL LOW (ref 135–145)
Total Bilirubin: 0.6 mg/dL (ref 0.2–1.2)
Total Protein: 8.1 g/dL (ref 6.0–8.3)

## 2020-12-07 LAB — SEDIMENTATION RATE: Sed Rate: 39 mm/hr — ABNORMAL HIGH (ref 0–30)

## 2020-12-07 LAB — PHOSPHORUS: Phosphorus: 4.3 mg/dL (ref 2.3–4.6)

## 2020-12-07 LAB — C-REACTIVE PROTEIN: CRP: 1.2 mg/dL (ref 0.5–20.0)

## 2020-12-07 LAB — VITAMIN D 25 HYDROXY (VIT D DEFICIENCY, FRACTURES): VITD: 73.78 ng/mL (ref 30.00–100.00)

## 2020-12-08 ENCOUNTER — Telehealth: Payer: Self-pay | Admitting: Pharmacist

## 2020-12-08 NOTE — Telephone Encounter (Addendum)
Called pt to discuss. She's been taking Praluent since 2018 PharmD visit, dose increased from 75mg  to 150mg  in 2021 to target LDL goal < 70 given hx of CAD (1-39% bilateral carotid artery stenosis noted on 08/2016 carotid ultrasound and pt reported ischemic brain lesions). Previously intolerant to rosuvastatin 5mg  3x per week, atorvastatin 10mg  daily (muscle cramps with both statins) and ezetimibe 5-10mg  daily (dizziness, upset stomach). Reports adherence to Praluent injections and is tolerating therapy well.  Most recent labs show well controlled LDL of 60 (improved from NMR lipid panel 06/05/20 when LDL was 91 despite no med changes since then), TC mildly elevated at 201 however that's secondary to her high HDL. Does look like it's trended up since 2018 (66>>79>88>>103>>>>89>>116.9). Her medications are not contributing to this. Reports appetite is a bit less and she's lost some weight, about 93 lbs at home (BMI 20.5), but no notable lifestyle changes.   Pt reports she researched that high HDL carries risk. Discussed that there is some data that HDL > 80 in patients with CAD can increase CV mortality risk but we don't have a great way to measure functionality of HDL and would still recommend treating her LDL aggressively which we're currently doing. Don't recommend any medication changes based on her lab results. Asks if she needs to be seen sooner by Dr Tamala Julian, advised her she does not based on her lab results. She was last seen 9/22 and stable for 1 year follow up.  Pt also concerned about her relative lymphocyte count because it's decreased by 50%. Discussed that lymphocyte count is just slightly low and has historically trended in the normal range. Advised her that labs were reviewed by PCP earlier today and noted to be stable, can discuss further at 11/21 visit if any other concerns.

## 2020-12-08 NOTE — Telephone Encounter (Signed)
pt is wanting to let you know her hdl is 116.9 and is wanting to know if this is going to effect her other medications.. pt is on prauluent and cholestrol is 201.

## 2020-12-14 ENCOUNTER — Encounter: Payer: Self-pay | Admitting: Family Medicine

## 2020-12-14 ENCOUNTER — Ambulatory Visit (INDEPENDENT_AMBULATORY_CARE_PROVIDER_SITE_OTHER): Payer: Medicare Other | Admitting: Family Medicine

## 2020-12-14 VITALS — BP 124/80 | HR 77 | Temp 98.0°F | Ht <= 58 in | Wt 94.4 lb

## 2020-12-14 DIAGNOSIS — K219 Gastro-esophageal reflux disease without esophagitis: Secondary | ICD-10-CM | POA: Diagnosis not present

## 2020-12-14 DIAGNOSIS — Z23 Encounter for immunization: Secondary | ICD-10-CM | POA: Diagnosis not present

## 2020-12-14 DIAGNOSIS — E7849 Other hyperlipidemia: Secondary | ICD-10-CM

## 2020-12-14 DIAGNOSIS — E871 Hypo-osmolality and hyponatremia: Secondary | ICD-10-CM

## 2020-12-14 DIAGNOSIS — J4520 Mild intermittent asthma, uncomplicated: Secondary | ICD-10-CM

## 2020-12-14 DIAGNOSIS — D72819 Decreased white blood cell count, unspecified: Secondary | ICD-10-CM | POA: Diagnosis not present

## 2020-12-14 DIAGNOSIS — M810 Age-related osteoporosis without current pathological fracture: Secondary | ICD-10-CM

## 2020-12-14 DIAGNOSIS — I10 Essential (primary) hypertension: Secondary | ICD-10-CM

## 2020-12-14 DIAGNOSIS — M353 Polymyalgia rheumatica: Secondary | ICD-10-CM

## 2020-12-14 NOTE — Patient Instructions (Signed)
Try for one month to see if it helps with voice/heartburn: pepcid morning, bedtime and then pantoprazole 30 minutes prior to dinner.

## 2020-12-14 NOTE — Progress Notes (Signed)
TAYJAH LOBDELL DOB: 03-Apr-1945 Encounter date: 12/14/2020  This is a 75 y.o. female who presents with Chief Complaint  Patient presents with   Annual Exam    History of present illness: Seeing neuro otolaryngologist tomorrow at [facility]. Continues to have dizziness. She really has adjusted to living with this. Has seen both neurology and ENT at this point. Not regularly doing exercises for positional vertigo.   Has bumps under skin; just wondering what those are.   Voice is still not right. Feels that this may be related to acid reflux. She did have scope from ENT. At one point was more severe (she couldn't get appointment here then and ended up going to hospital). She was given medicine at that time for reflux, but she didn't know that's what it was for. Takes pepcid in the morning unless she asks for pantoprazole in the morning. Does have some chest pain in the morning; wakes up from sleep sometimes with it. Has been there awhile.   She doesn't really have specific concerns today. Moving with husband to assisted/independent living facility this week. Looking forward to exercise programs and good food there!   Takes the magnesium regularly (not daily) and takes miralax in between doses.   Some numbness toes left foot. Neuropathy for years; occasionally.   Follows with dermatology yearly, Dr. Elvera Lennox.  Sees Dr. Posey Pronto for nephrology management.  Follows with Dr. Tamala Julian for cardiology care. Also meeting with pharmacy in cardiology for lipid management.  Currently taking Praluent for cholesterol control. Has seen Dr. Jaynee Eagles, neurology for assessment of dizziness.  She has had MRI of the brain, cervical spine.  Extensive testing by neurology and ENT.  Suggested her neurology was to follow-up with neuro ENT specialist. Follows with Dr. Fuller Plan for GI management.  Last colonoscopy was 09/2017.  Diverticulosis noted in the left colon at that time.  Constipation has improved.  Due for repeat  colonoscopy 09/2022 due to family history.  Last mammogram 08/14/2020 was normal. Last bone density was 02/14/2020.  Osteoporosis is managed by rheumatology,Dr. Amil Amen. She is on prolia, vitamin D.   Allergies  Allergen Reactions   Tylenol [Acetaminophen] Cough   Turmeric     Lowers BP   Erythromycin     Stomach cramps   Erythromycin Base Other (See Comments)    Stomach issues   Other     Beta-blockers-patient states it makes her crazy   Sulfa Antibiotics Other (See Comments)    Doesn't recall   Current Meds  Medication Sig   Alirocumab (PRALUENT) 150 MG/ML SOAJ Inject 1 pen into the skin every 14 (fourteen) days.   amLODipine (NORVASC) 5 MG tablet TAKE 1 TABLET BY MOUTH EVERY DAY   aspirin 81 MG tablet Take 81 mg by mouth daily.   cetirizine (ZYRTEC) 10 MG tablet Take 10 mg by mouth 2 (two) times daily.    denosumab (PROLIA) 60 MG/ML SOLN injection Inject 60 mg into the skin every 6 (six) months. Administer in upper arm, thigh, or abdomen   famotidine (PEPCID) 20 MG tablet Take 20 mg by mouth daily.   fluticasone (FLONASE) 50 MCG/ACT nasal spray Place 2 sprays into both nostrils daily.   furosemide (LASIX) 20 MG tablet Take 20 mg by mouth daily.   LORazepam (ATIVAN) 1 MG tablet Take 1 mg by mouth at bedtime as needed (spasms).   MAGNESIUM PO Take 400 mg by mouth every 3 (three) days.   Misc Natural Products (NEURIVA PO) Take by mouth daily.  PANTOPRAZOLE SODIUM PO Take by mouth.   VITAMIN D PO Take by mouth.    Review of Systems  Constitutional:  Negative for activity change, appetite change, chills, fatigue, fever and unexpected weight change.  HENT:  Negative for congestion, ear pain, hearing loss, sinus pressure, sinus pain, sore throat and trouble swallowing.   Eyes:  Negative for pain and visual disturbance.  Respiratory:  Negative for cough, chest tightness, shortness of breath and wheezing.   Cardiovascular:  Negative for chest pain, palpitations and leg swelling.   Gastrointestinal:  Negative for abdominal pain, blood in stool, constipation, diarrhea, nausea and vomiting.  Genitourinary:  Negative for difficulty urinating and menstrual problem.  Musculoskeletal:  Positive for arthralgias (once she is up and moving; tends to do ok from joint standpoint). Negative for back pain.  Skin:  Negative for rash.  Neurological:  Positive for dizziness. Negative for weakness, numbness and headaches.  Hematological:  Negative for adenopathy. Does not bruise/bleed easily.  Psychiatric/Behavioral:  Negative for sleep disturbance and suicidal ideas. The patient is not nervous/anxious.    Objective:  BP 124/80 (BP Location: Right Arm, Patient Position: Sitting, Cuff Size: Normal)   Pulse 77   Temp 98 F (36.7 C) (Oral)   Ht 4' 8.25" (1.429 m)   Wt 94 lb 6.4 oz (42.8 kg)   SpO2 97%   BMI 20.98 kg/m   Weight: 94 lb 6.4 oz (42.8 kg)   BP Readings from Last 3 Encounters:  12/14/20 124/80  10/15/20 126/74  07/23/20 (!) 146/82   Wt Readings from Last 3 Encounters:  12/14/20 94 lb 6.4 oz (42.8 kg)  10/15/20 94 lb 9.6 oz (42.9 kg)  07/23/20 95 lb (43.1 kg)    Physical Exam Constitutional:      General: She is not in acute distress.    Appearance: She is well-developed.  HENT:     Head: Normocephalic and atraumatic.     Right Ear: External ear normal.     Left Ear: External ear normal.     Mouth/Throat:     Pharynx: No oropharyngeal exudate.  Eyes:     Conjunctiva/sclera: Conjunctivae normal.     Pupils: Pupils are equal, round, and reactive to light.  Neck:     Thyroid: No thyromegaly.  Cardiovascular:     Rate and Rhythm: Normal rate and regular rhythm.     Heart sounds: Normal heart sounds. No murmur heard.   No friction rub. No gallop.  Pulmonary:     Effort: Pulmonary effort is normal.     Breath sounds: Normal breath sounds.  Abdominal:     General: Bowel sounds are normal. There is no distension.     Palpations: Abdomen is soft. There is  no mass.     Tenderness: There is no abdominal tenderness. There is no guarding.     Hernia: No hernia is present.  Musculoskeletal:        General: No tenderness or deformity. Normal range of motion.     Cervical back: Normal range of motion and neck supple.  Lymphadenopathy:     Cervical: No cervical adenopathy.  Skin:    General: Skin is warm and dry.     Findings: No rash.  Neurological:     Mental Status: She is alert and oriented to person, place, and time.     Deep Tendon Reflexes: Reflexes normal.     Reflex Scores:      Tricep reflexes are 2+ on the right side and  2+ on the left side.      Bicep reflexes are 2+ on the right side and 2+ on the left side.      Brachioradialis reflexes are 2+ on the right side and 2+ on the left side.      Patellar reflexes are 2+ on the right side and 2+ on the left side.    Comments: She does have some balance difficulty with changes in position - sitting to standing, bending forward.  Psychiatric:        Speech: Speech normal.        Behavior: Behavior normal.        Thought Content: Thought content normal.    Assessment/Plan  1. Essential hypertension Currently taking 5 mg amlodipine.  Blood pressure well controlled.  2. Mild intermittent asthma without complication Allergies controlled with Zyrtec 10 mg daily, Flonase nasal spray.  3. Gastroesophageal reflux disease, unspecified whether esophagitis present Noting increased acid reflux at night and first thing in the morning.  She is not taking her antacids regularly.  We discussed 1 month trial of taking Pepcid in the morning (before eating and coffee drinking), pantoprazole 30 minutes before dinner, and Pepcid again at bedtime.  If symptoms or not improved with this would consider follow-up with GI for further evaluation.  4. Osteoporosis, unspecified osteoporosis type, unspecified pathological fracture presence Managed by rheumatology.  Currently on Prolia.  5.  Hyponatremia Sodium levels have been stable.  Continue to monitor.  6. Other hyperlipidemia Praluent injections.  Lipids are well controlled.  7. PMR (polymyalgia rheumatica) (HCC) Currently feels that joint pain is well managed with single dose of over-the-counter NSAID.  Limits use to bedtime.  8. Leukopenia, unspecified type - CBC with Differential/Platelet; Future  9. Need for pneumococcal vaccination - Pneumococcal conjugate vaccine 20-valent (Prevnar 20)  10. Hypercalcemia - Comprehensive metabolic panel.   Return in about 6 months (around 06/13/2021) for Chronic condition visit; repeat bloodwork 1-2 months.      Micheline Rough, MD

## 2020-12-15 DIAGNOSIS — R42 Dizziness and giddiness: Secondary | ICD-10-CM | POA: Diagnosis not present

## 2021-01-06 DIAGNOSIS — H832X2 Labyrinthine dysfunction, left ear: Secondary | ICD-10-CM | POA: Diagnosis not present

## 2021-01-06 DIAGNOSIS — R42 Dizziness and giddiness: Secondary | ICD-10-CM | POA: Diagnosis not present

## 2021-01-06 DIAGNOSIS — H903 Sensorineural hearing loss, bilateral: Secondary | ICD-10-CM | POA: Diagnosis not present

## 2021-01-13 ENCOUNTER — Telehealth: Payer: Self-pay | Admitting: Pharmacist

## 2021-01-13 NOTE — Telephone Encounter (Signed)
Pt called clinic stating Praluent copay is cost prohibitive. Looks like her [employer] expired on the 5th. I have renewed this for another year and called updated info to her pharmacy. Confirmed copay is $0 again, pt very appreciative for assistance.

## 2021-02-04 ENCOUNTER — Other Ambulatory Visit: Payer: Self-pay | Admitting: Family Medicine

## 2021-02-10 DIAGNOSIS — R2681 Unsteadiness on feet: Secondary | ICD-10-CM | POA: Diagnosis not present

## 2021-02-10 DIAGNOSIS — R42 Dizziness and giddiness: Secondary | ICD-10-CM | POA: Diagnosis not present

## 2021-02-16 DIAGNOSIS — R42 Dizziness and giddiness: Secondary | ICD-10-CM | POA: Diagnosis not present

## 2021-02-16 DIAGNOSIS — R2681 Unsteadiness on feet: Secondary | ICD-10-CM | POA: Diagnosis not present

## 2021-02-18 ENCOUNTER — Ambulatory Visit (INDEPENDENT_AMBULATORY_CARE_PROVIDER_SITE_OTHER): Payer: Medicare Other | Admitting: Allergy & Immunology

## 2021-02-18 ENCOUNTER — Other Ambulatory Visit: Payer: Self-pay

## 2021-02-18 ENCOUNTER — Encounter: Payer: Self-pay | Admitting: Allergy & Immunology

## 2021-02-18 VITALS — BP 124/60 | HR 88 | Temp 97.0°F | Resp 16 | Ht <= 58 in | Wt 96.6 lb

## 2021-02-18 DIAGNOSIS — R2681 Unsteadiness on feet: Secondary | ICD-10-CM | POA: Diagnosis not present

## 2021-02-18 DIAGNOSIS — R053 Chronic cough: Secondary | ICD-10-CM | POA: Diagnosis not present

## 2021-02-18 DIAGNOSIS — K219 Gastro-esophageal reflux disease without esophagitis: Secondary | ICD-10-CM

## 2021-02-18 DIAGNOSIS — J310 Chronic rhinitis: Secondary | ICD-10-CM | POA: Diagnosis not present

## 2021-02-18 DIAGNOSIS — R42 Dizziness and giddiness: Secondary | ICD-10-CM

## 2021-02-18 MED ORDER — GABAPENTIN 100 MG PO CAPS: 100.0000 mg | ORAL_CAPSULE | Freq: Every day | ORAL | 3 refills | Status: DC

## 2021-02-18 MED ORDER — AZELASTINE HCL 0.1 % NA SOLN: 2.0000 | Freq: Every day | NASAL | 5 refills | Status: DC

## 2021-02-18 MED ORDER — FAMOTIDINE 40 MG PO TABS: 40.0000 mg | ORAL_TABLET | Freq: Every day | ORAL | 5 refills | Status: DC

## 2021-02-18 MED ORDER — CETIRIZINE HCL 10 MG PO TABS: 10.0000 mg | ORAL_TABLET | Freq: Two times a day (BID) | ORAL | 5 refills | Status: DC

## 2021-02-18 NOTE — Progress Notes (Signed)
FOLLOW UP  Date of Service/Encounter:  02/18/21   Assessment:   Cough - with previously normal spirometry   Chronic rhinitis (molds) - consider intradermal testing in the future   Gastroesophageal reflux disease   Vertigo - followed by the vestibular rehab program with Dr. Benjamine Mola   Plan/Recommendations:   1. Cough with postnasal drip (sensitization to molds) - Continue with cetrizine 10mg  twice daily.  - Continue with the vestibular rehabilitation.  - Continue with azelastine one spray per nostril nightly.  - We are going to start you on gabapentin 100mg  nightly to see if this can help with the cough (in case this is a neurogenic cough).   2. Gastroesophageal reflux disease - Continue with famotidine 40mg  at night (you CAN increase to twice daily to see if that helps).  3. Return in about 1 year (around 02/18/2022).   Subjective:   Amanda Cannon is a 76 y.o. female presenting today for follow up of  Chief Complaint  Patient presents with   Follow-up   Cough    ANGLES TREVIZO has a history of the following: Patient Active Problem List   Diagnosis Date Noted   Degenerative cervical disc 01/01/2020   Vertigo 08/06/2019   Cough 08/06/2019   Mixed rhinitis 08/06/2019   Gastroesophageal reflux disease 08/06/2019   Trigeminal neuropathy 11/13/2017   PMR (polymyalgia rheumatica) ([place]) 04/24/2017   Hyponatremia 07/15/2015   Hyperlipidemia 07/15/2015   Esophageal spasm 07/15/2015   Essential hypertension 07/15/2015   Depression 07/15/2015   Asthma 07/15/2015   Tinnitus 07/15/2015   Osteoporosis 07/15/2015   Spasm    Prinzmetal angina (HCC)    Mitral valve prolapse    White matter changes     History obtained from: chart review and patient.  Amanda Cannon is a 76 y.o. female presenting for a follow up visit.  She was last seen in January 2022.  At that time, we continue with cetirizine as well as Astelin nasal spray.  She was having some shortness of breath which  I felt might be secondary to vocal cord dysfunction.  We discussed this as a possible diagnosis of vocal cord dysfunction.  For her reflux, we stopped his pantoprazole because of her history of osteoporosis.  We continued with famotidine 40 mg at night.  Since last visit, she has largely done okay.  She and her husband both got COVID-19 around March 2022.  They were able to manage this at home.  They are fully immunized, including the most recent by valent booster.  She is having a lot of coughing at night.  She thinks this might be secondary to RSV that they contracted when they were visiting family down in [place].  She has been using a Vicks cough medicine with honey with minimal relief.  She is wondering if there is another cough medicine that might work better.  She does have a history of reflux and has stopped her pantoprazole daily.  She will use it 1-2 times a week when her symptoms are particularly bothersome.  It seems to work the best, but she does not want to take it every day.  She has famotidine but is never tried taking it twice daily.  She saw Dr. Benjamine Mola at some point in the last 12 months and was told that her reflux was not well controlled.  There was apparently redness in her throat.  She has never been on gabapentin for this cough.  She remains on her cetirizine tablet in the  morning and occasionally at nighttime dose.  She also is on Flonase.  Otherwise, there have been no changes to her past medical history, surgical history, family history, or social history.    Review of Systems  Constitutional: Negative.  Negative for chills, fever, malaise/fatigue and weight loss.  HENT:  Negative for congestion, ear discharge, ear pain and sinus pain.   Eyes:  Negative for pain, discharge and redness.  Respiratory:  Positive for cough. Negative for sputum production, shortness of breath and wheezing.   Cardiovascular: Negative.  Negative for chest pain and palpitations.  Gastrointestinal:   Negative for abdominal pain, constipation, diarrhea, heartburn, nausea and vomiting.  Skin: Negative.  Negative for itching and rash.  Neurological:  Negative for dizziness and headaches.  Endo/Heme/Allergies:  Negative for environmental allergies. Does not bruise/bleed easily.      Objective:   Blood pressure 124/60, pulse 88, temperature (!) 97 F (36.1 C), temperature source Temporal, resp. rate 16, height 4' 8.25" (1.429 m), weight 96 lb 9.6 oz (43.8 kg), SpO2 99 %. Body mass index is 21.47 kg/m.   Physical Exam:  Physical Exam Vitals reviewed.  Constitutional:      Appearance: She is well-developed.  HENT:     Head: Normocephalic and atraumatic.     Right Ear: Tympanic membrane, ear canal and external ear normal.     Left Ear: Tympanic membrane, ear canal and external ear normal.     Nose: No nasal deformity, septal deviation, mucosal edema or rhinorrhea.     Right Turbinates: Not enlarged, swollen or pale.     Left Turbinates: Not enlarged, swollen or pale.     Right Sinus: No maxillary sinus tenderness or frontal sinus tenderness.     Left Sinus: No maxillary sinus tenderness or frontal sinus tenderness.     Comments: Some dried rhinorrhea present.     Mouth/Throat:     Mouth: Mucous membranes are not pale and not dry.     Pharynx: Uvula midline.  Eyes:     General:        Right eye: No discharge.        Left eye: No discharge.     Conjunctiva/sclera: Conjunctivae normal.     Right eye: Right conjunctiva is not injected. No chemosis.    Left eye: Left conjunctiva is not injected. No chemosis.    Pupils: Pupils are equal, round, and reactive to light.  Cardiovascular:     Rate and Rhythm: Normal rate and regular rhythm.     Heart sounds: Normal heart sounds.  Pulmonary:     Effort: Pulmonary effort is normal. No tachypnea, accessory muscle usage or respiratory distress.     Breath sounds: Normal breath sounds. No wheezing, rhonchi or rales.  Chest:     Chest  wall: No tenderness.  Lymphadenopathy:     Cervical: No cervical adenopathy.  Skin:    Coloration: Skin is not pale.     Findings: No abrasion, erythema, petechiae or rash. Rash is not papular, urticarial or vesicular.  Neurological:     Mental Status: She is alert.  Psychiatric:        Behavior: Behavior is cooperative.     Diagnostic studies:    Spirometry: results abnormal (FEV1: 1.02/75%, FVC: 1.21/65%, FEV1/FVC: 84%).    Spirometry consistent with possible restrictive disease.   Allergy Studies: none        Salvatore Marvel, MD  [facility]

## 2021-02-18 NOTE — Patient Instructions (Addendum)
1. Cough with postnasal drip (sensitization to molds) - Continue with cetrizine 10mg  twice daily.  - Continue with the vestibular rehabilitation.  - Continue with azelastine one spray per nostril nightly.  - We are going to start you on gabapentin 100mg  nightly to see if this can help with the cough (in case this is a neurogenic cough).   2. Gastroesophageal reflux disease - Continue with famotidine 40mg  at night (you CAN increase to twice daily to see if that helps).  3. Return in about 1 year (around 02/18/2022).    Please inform us of any Emergency Department visits, hospitalizations, or changes in symptoms. Call us before going to the ED for breathing or allergy symptoms since we might be able to fit you in for a sick visit. Feel free to contact us anytime with any questions, problems, or concerns.  It was a pleasure to see you and your family again today!  Websites that have reliable patient information: 1. American Academy of Asthma, Allergy, and Immunology: www.aaaai.org 2. Food Allergy Research and Education (FARE): foodallergy.org 3. Mothers of Asthmatics: http://www.asthmacommunitynetwork.org 4. American College of Allergy, Asthma, and Immunology: www.acaai.org   COVID-19 Vaccine Information can be found at: ShippingScam.co.uk For questions related to vaccine distribution or appointments, please email [email or web address] or call [phone].   We realize that you might be concerned about having an allergic reaction to the COVID19 vaccines. To help with that concern, WE ARE OFFERING THE COVID19 VACCINES IN OUR OFFICE! Ask the front desk for dates!     Like [place] on [employer] and Instagram for our latest updates!      A healthy democracy works best when [employer] participate! Make sure you are registered to vote! If you have moved or changed any of your contact information, you will need to get this updated before  voting!  In some cases, you MAY be able to register to vote online: [email or web address]

## 2021-02-22 ENCOUNTER — Encounter: Payer: Self-pay | Admitting: Neurology

## 2021-02-23 DIAGNOSIS — R2681 Unsteadiness on feet: Secondary | ICD-10-CM | POA: Diagnosis not present

## 2021-02-23 DIAGNOSIS — R42 Dizziness and giddiness: Secondary | ICD-10-CM | POA: Diagnosis not present

## 2021-02-23 NOTE — Telephone Encounter (Signed)
I agree, ER evaluation is best if she's having new weakness and speech difficulties

## 2021-03-03 DIAGNOSIS — R2681 Unsteadiness on feet: Secondary | ICD-10-CM | POA: Diagnosis not present

## 2021-03-03 DIAGNOSIS — R42 Dizziness and giddiness: Secondary | ICD-10-CM | POA: Diagnosis not present

## 2021-03-08 ENCOUNTER — Telehealth: Payer: Self-pay | Admitting: Family Medicine

## 2021-03-08 ENCOUNTER — Ambulatory Visit (INDEPENDENT_AMBULATORY_CARE_PROVIDER_SITE_OTHER): Payer: Medicare Other

## 2021-03-08 ENCOUNTER — Other Ambulatory Visit (INDEPENDENT_AMBULATORY_CARE_PROVIDER_SITE_OTHER): Payer: Medicare Other

## 2021-03-08 VITALS — BP 122/62 | HR 76 | Temp 98.7°F | Ht <= 58 in | Wt 95.8 lb

## 2021-03-08 DIAGNOSIS — D72819 Decreased white blood cell count, unspecified: Secondary | ICD-10-CM

## 2021-03-08 DIAGNOSIS — Z0000 Encounter for general adult medical examination without abnormal findings: Secondary | ICD-10-CM | POA: Diagnosis not present

## 2021-03-08 LAB — CBC WITH DIFFERENTIAL/PLATELET
Basophils Absolute: 0 10*3/uL (ref 0.0–0.1)
Basophils Relative: 0.5 % (ref 0.0–3.0)
Eosinophils Absolute: 0.2 10*3/uL (ref 0.0–0.7)
Eosinophils Relative: 3.1 % (ref 0.0–5.0)
HCT: 39.1 % (ref 36.0–46.0)
Hemoglobin: 13.1 g/dL (ref 12.0–15.0)
Lymphocytes Relative: 24.5 % (ref 12.0–46.0)
Lymphs Abs: 1.7 10*3/uL (ref 0.7–4.0)
MCHC: 33.4 g/dL (ref 30.0–36.0)
MCV: 96.6 fl (ref 78.0–100.0)
Monocytes Absolute: 0.8 10*3/uL (ref 0.1–1.0)
Monocytes Relative: 11.7 % (ref 3.0–12.0)
Neutro Abs: 4.1 10*3/uL (ref 1.4–7.7)
Neutrophils Relative %: 60.2 % (ref 43.0–77.0)
Platelets: 263 10*3/uL (ref 150.0–400.0)
RBC: 4.05 Mil/uL (ref 3.87–5.11)
RDW: 13.6 % (ref 11.5–15.5)
WBC: 6.9 10*3/uL (ref 4.0–10.5)

## 2021-03-08 NOTE — Progress Notes (Signed)
Subjective:   Amanda Cannon is a 76 y.o. female who presents for Medicare Annual (Subsequent) preventive examination.  Review of Systems     Cardiac Risk Factors include: advanced age (>39men, >74 women);hypertension     Objective:    Today's Vitals   03/08/21 1420  BP: 122/62  Pulse: 76  Temp: 98.7 F (37.1 C)  TempSrc: Oral  Weight: 95 lb 12.8 oz (43.5 kg)  Height: 4\' 8"  (1.422 m)   Body mass index is 21.48 kg/m.  Advanced Directives 06/16/2020 03/02/2020 05/10/2019 01/22/2019 11/09/2017 11/08/2016  Does Patient Have a Medical Advance Directive? Yes Yes No Yes Yes No  Type of Advance Directive Living will;Healthcare Power of [place];Living will - [place];Living will - -  Does patient want to make changes to medical advance directive? - No - Patient declined - No - Patient declined - -  Copy of [place] in Chart? - Yes - validated most recent copy scanned in chart (See row information) - No - copy requested - -  Would patient like information on creating a medical advance directive? - - No - Patient declined - - -    Current Medications (verified) Outpatient Encounter Medications as of 03/08/2021  Medication Sig   Alirocumab (PRALUENT) 150 MG/ML SOAJ Inject 1 pen into the skin every 14 (fourteen) days.   amLODipine (NORVASC) 5 MG tablet TAKE 1 TABLET BY MOUTH EVERY DAY   aspirin 81 MG tablet Take 81 mg by mouth daily.   denosumab (PROLIA) 60 MG/ML SOLN injection Inject 60 mg into the skin every 6 (six) months. Administer in upper arm, thigh, or abdomen   famotidine (PEPCID) 20 MG tablet Take 20 mg by mouth daily.   famotidine (PEPCID) 40 MG tablet Take 1 tablet (40 mg total) by mouth at bedtime.   fluticasone (FLONASE) 50 MCG/ACT nasal spray Place 2 sprays into both nostrils daily.   furosemide (LASIX) 20 MG tablet Take 20 mg by mouth daily.   gabapentin (NEURONTIN) 100 MG capsule Take 1 capsule (100  mg total) by mouth at bedtime.   LORazepam (ATIVAN) 1 MG tablet Take 1 mg by mouth at bedtime as needed (spasms).   MAGNESIUM PO Take 400 mg by mouth every 3 (three) days.   PANTOPRAZOLE SODIUM PO Take by mouth.   VITAMIN D PO Take by mouth.   azelastine (ASTELIN) 0.1 % nasal spray Place 2 sprays into both nostrils at bedtime. (Patient not taking: Reported on 03/08/2021)   cetirizine (ZYRTEC) 10 MG tablet Take 10 mg by mouth 2 (two) times daily.    cetirizine (ZYRTEC) 10 MG tablet Take 1 tablet (10 mg total) by mouth 2 (two) times daily.   No facility-administered encounter medications on file as of 03/08/2021.    Allergies (verified) Tylenol [acetaminophen], Turmeric, Erythromycin, Erythromycin base, Other, and Sulfa antibiotics   History: Past Medical History:  Diagnosis Date   Alcohol use    Anemia    Anxiety    Asthma    Basal cell carcinoma    forehead   Breast cancer ([place]) 1998   left    Colon polyps    GERD (gastroesophageal reflux disease)    Glaucoma    Hyperlipidemia    Hypertension    Hyponatremia    remote, mild   Mitral valve prolapse    Osteoarthritis    sees rheumatologist   Osteoporosis    sees rheumatologist   Personal history of radiation  therapy 1998   PMR (polymyalgia rheumatica) (HCC)    sees rheumatologist   Pneumonia    Prinzmetal angina ([place])    White matter changes    ? ischemic sm vs per pt   Past Surgical History:  Procedure Laterality Date   ABDOMINAL HYSTERECTOMY     BREAST LUMPECTOMY Left 1998   cancer   CESAREAN SECTION     x 4   COLONOSCOPY  07/2012   in [place]  2005   abdominal hernia repair   HYSTEROTOMY     LYMPH NODE DISSECTION Left    POLYPECTOMY     Family History  Problem Relation Age of Onset   Coronary artery disease Mother 1   Coronary artery disease Father 52   Cancer - Ovarian Sister    Colon cancer Cousin        x 2   Esophageal cancer Neg Hx    Rectal cancer Neg Hx    Stomach cancer Neg Hx     Social History   Socioeconomic History   Marital status: Married    Spouse name: Zenia Resides    Number of children: 3   Years of education: 23   Highest education level: Some college, no degree  Occupational History   Occupation: Psychologist, occupational  Tobacco Use   Smoking status: Former    Packs/day: 1.00    Years: 7.00    Pack years: 7.00    Types: Cigarettes    Quit date: 01/25/1971    Years since quitting: 50.1   Smokeless tobacco: Never  Vaping Use   Vaping Use: Never used  Substance and Sexual Activity   Alcohol use: Yes    Alcohol/week: 14.0 standard drinks    Types: 14 Standard drinks or equivalent per week    Comment: two drinks with burbon + water daily    Drug use: No   Sexual activity: Yes  Other Topics Concern   Not on file  Social History Narrative   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 Situation: lives with husband      Spiritual Beliefs: Jewish      Lifestyle: active, diet is good      3 children + grandchildren   1 cat    Social Determinants of Radio broadcast assistant Strain: Low Risk    Difficulty of Paying Living Expenses: Not hard at all  Food Insecurity: No Food Insecurity   Worried About Charity fundraiser in the Last Year: Never true   Arboriculturist in the Last Year: Never true  Transportation Needs: No Transportation Needs   Lack of Transportation (Medical): No   Lack of Transportation (Non-Medical): No  Physical Activity: Sufficiently Active   Days of Exercise per Week: 5 days   Minutes of Exercise per Session: 60 min  Stress: No Stress Concern Present   Feeling of Stress : Not at all  Social Connections: Socially Integrated   Frequency of Communication with Friends and Family: More than three times a week   Frequency of Social Gatherings with Friends and Family: More than three times a week   Attends Religious Services: More than 4 times per year   Active Member of [employer] or  Organizations: Yes   Attends Music therapist: More than 4 times per year   Marital Status: Married    Tobacco Counseling Counseling given: Not Answered   Clinical Intake:  Pre-visit preparation completed: Yes  Pain : No/denies pain     BMI - recorded: 21.46 Nutritional Status: BMI of 19-24  Normal Nutritional Risks: None Diabetes: No  How often do you need to have someone help you when you read instructions, pamphlets, or other written materials from your doctor or pharmacy?: 1 - Never  Diabetic? No  Interpreter Needed?: No  Information entered by :: Rolene Arbour LPN   Activities of Daily Living In your present state of health, do you have any difficulty performing the following activities: 03/08/2021  Hearing? N  Vision? N  Difficulty concentrating or making decisions? N  Walking or climbing stairs? N  Dressing or bathing? N  Doing errands, shopping? N  Preparing Food and eating ? N  Using the Toilet? N  In the past six months, have you accidently leaked urine? N  Do you have problems with loss of bowel control? N  Managing your Medications? N  Managing your Finances? N  Housekeeping or managing your Housekeeping? N  Some recent data might be hidden    Patient Care Team: [name], MD as PCP - General (Family Medicine) Belva Crome, MD as PCP - Cardiology (Cardiology) Elmarie Shiley, MD as Consulting Physician (Nephrology)  Indicate any recent Medical Services you may have received from other than [facility] providers in the past year (date may be approximate).     Assessment:   This is a routine wellness examination for [place].  Hearing/Vision screen Hearing Screening - Comments:: No hearing difficulty Vision Screening - Comments:: Wears reading glasses. Followed by Dr Jola Schmidt  Dietary issues and exercise activities discussed: Current Exercise Habits: Home exercise routine, Type of exercise: walking, Time (Minutes): 60,  Frequency (Times/Week): 5, Weekly Exercise (Minutes/Week): 300, Intensity: Moderate, Exercise limited by: None identified   Goals Addressed   None    Depression Screen PHQ 2/9 Scores 03/08/2021 12/14/2020 03/02/2020 01/22/2019 11/09/2017 05/15/2017 11/08/2016  PHQ - 2 Score 0 1 0 2 0 2 0  PHQ- 9 Score - 2 - 5 - 9 -    Fall Risk Fall Risk  03/08/2021 03/07/2021 12/14/2020 03/02/2020 01/22/2019  Falls in the past year? 0 0 1 0 0  Comment - - - - -  Number falls in past yr: 0 - 0 0 -  Injury with Fall? 0 - 1 0 -  Risk for fall due to : No Fall Risks - - No Fall Risks Medication side effect  Follow up - - - Falls evaluation completed;Falls prevention discussed Falls evaluation completed;Education provided;Falls prevention discussed    FALL RISK PREVENTION PERTAINING TO THE HOME:  Any stairs in or around the home? Yes  If so, are there any without handrails? No  Home free of loose throw rugs in walkways, pet beds, electrical cords, etc? Yes  Adequate lighting in your home to reduce risk of falls? Yes   ASSISTIVE DEVICES UTILIZED TO PREVENT FALLS:  Life alert? No  Use of a cane, walker or w/c? No  Grab bars in the bathroom? Yes  Shower chair or bench in shower? Yes  Elevated toilet seat or a handicapped toilet? Yes   TIMED UP AND GO:  Was the test performed? Yes .  Length of time to ambulate 10 feet: 5 sec.   Gait steady and fast without use of assistive device  Cognitive Function: MMSE - Mini Mental State Exam 11/09/2017 11/08/2016  Not completed: (No Data) (No Data)     6CIT Screen 03/08/2021 01/22/2019  What Year? 0 points 0 points  What month? 0 points 0 points  What time? 0 points 0 points  Count back from 20 0 points 0 points  Months in reverse 0 points 0 points  Repeat phrase 0 points 0 points  Total Score 0 0    Immunizations Immunization History  Administered Date(s) Administered   Fluad Quad(high Dose 65+) 10/11/2018   Influenza, High Dose Seasonal PF  11/15/2016, 11/09/2017   Influenza-Unspecified 10/25/2015, 11/03/2019, 11/23/2020   PFIZER(Purple Top)SARS-COV-2 Vaccination 02/08/2019, 02/28/2019, 10/05/2019, 05/22/2020   PNEUMOCOCCAL CONJUGATE-20 12/14/2020   Pfizer Covid-19 Vaccine Bivalent Booster 60yrs & up 12/07/2020   Pneumococcal Conjugate-13 07/24/2013    TDAP status: Due, Education has been provided regarding the importance of this vaccine. Advised may receive this vaccine at local pharmacy or Health Dept. Aware to provide a copy of the vaccination record if obtained from local pharmacy or Health Dept. Verbalized acceptance and understanding.  Flu Vaccine status: Up to date  Pneumococcal vaccine status: Up to date  Covid-19 vaccine status: Completed vaccines  Qualifies for Shingles Vaccine? Yes   Zostavax completed Yes   Shingrix Completed?: Yes  Screening Tests Health Maintenance  Topic Date Due   Zoster Vaccines- Shingrix (1 of 2) 03/16/2021 (Originally 10/28/1964)   TETANUS/TDAP  03/08/2022 (Originally 10/28/1964)   MAMMOGRAM  08/14/2021   DEXA SCAN  02/13/2022   COLONOSCOPY (Pts 45-75yrs Insurance coverage will need to be confirmed)  09/28/2022   Pneumonia Vaccine 32+ Years old  Completed   INFLUENZA VACCINE  Completed   COVID-19 Vaccine  Completed   Hepatitis C Screening  Completed   HPV VACCINES  Aged Out    Health Maintenance  There are no preventive care reminders to display for this patient.   Colorectal cancer screening: Type of screening: Colonoscopy. Completed 09/27/17. Repeat every 5 years  Mammogram status: Completed 08/14/20. Repeat every year  Bone Density status: Completed 02/14/20. Results reflect: Bone density results: OSTEOPENIA. Repeat every 2 years.  Lung Cancer Screening: (Low Dose CT Chest recommended if Age 61-80 years, 30 pack-year currently smoking OR have quit w/in 15years.) does not qualify.   Additional Screening:  Hepatitis C Screening: does qualify; Completed 03/12/18  Vision  Screening: Recommended annual ophthalmology exams for early detection of glaucoma and other disorders of the eye. Is the patient up to date with their annual eye exam?  Yes  Who is the provider or what is the name of the office in which the patient attends annual eye exams? Dr Wendall Mola If pt is not established with a provider, would they like to be referred to a provider to establish care? No .   Dental Screening: Recommended annual dental exams for proper oral hygiene  Community Resource Referral / Chronic Care Management:  CRR required this visit?  No   CCM required this visit?  No      Plan:     I have personally reviewed and noted the following in the patients chart:   Medical and social history Use of alcohol, tobacco or illicit drugs  Current medications and supplements including opioid prescriptions.  Functional ability and status Nutritional status Physical activity Advanced directives List of other physicians Hospitalizations, surgeries, and ER visits in previous 12 months Vitals Screenings to include cognitive, depression, and falls Referrals and appointments  In addition, I have reviewed and discussed with patient certain preventive protocols, quality metrics, and best practice recommendations. A written personalized care plan for preventive services as well as general preventive health  recommendations were provided to patient.     Criselda Peaches, LPN   [id number]   Nurse Notes: None

## 2021-03-08 NOTE — Patient Instructions (Addendum)
Amanda Cannon , Thank you for taking time to come for your Medicare Wellness Visit. I appreciate your ongoing commitment to your health goals. Please review the following plan we discussed and let me know if I can assist you in the future.   These are the goals we discussed:  Goals      Patient Stated     Patient would like to make social connections. She is having a difficult time finding volunteer opportunities during the pandemic.        This is a list of the screening recommended for you and due dates:  Health Maintenance  Topic Date Due   Zoster (Shingles) Vaccine (1 of 2) 03/16/2021*   Tetanus Vaccine  03/08/2022*   Mammogram  08/14/2021   DEXA scan (bone density measurement)  02/13/2022   Colon Cancer Screening  09/28/2022   Pneumonia Vaccine  Completed   Flu Shot  Completed   COVID-19 Vaccine  Completed   Hepatitis C Screening: USPSTF Recommendation to screen - Ages 18-79 yo.  Completed   HPV Vaccine  Aged Out  *Topic was postponed. The date shown is not the original due date.   Advanced directives: Yes Copy on file  Conditions/risks identified: None  Next appointment: Follow up in one year for your annual wellness visit    Preventive Care 65 Years and Older, Female Preventive care refers to lifestyle choices and visits with your health care provider that can promote health and wellness. What does preventive care include? A yearly physical exam. This is also called an annual well check. Dental exams once or twice a year. Routine eye exams. Ask your health care provider how often you should have your eyes checked. Personal lifestyle choices, including: Daily care of your teeth and gums. Regular physical activity. Eating a healthy diet. Avoiding tobacco and drug use. Limiting alcohol use. Practicing safe sex. Taking low-dose aspirin every day. Taking vitamin and mineral supplements as recommended by your health care provider. What happens during an annual  well check? The services and screenings done by your health care provider during your annual well check will depend on your age, overall health, lifestyle risk factors, and family history of disease. Counseling  Your health care provider may ask you questions about your: Alcohol use. Tobacco use. Drug use. Emotional well-being. Home and relationship well-being. Sexual activity. Eating habits. History of falls. Memory and ability to understand (cognition). Work and work Statistician. Reproductive health. Screening  You may have the following tests or measurements: Height, weight, and BMI. Blood pressure. Lipid and cholesterol levels. These may be checked every 5 years, or more frequently if you are over 42 years old. Skin check. Lung cancer screening. You may have this screening every year starting at age 53 if you have a 30-pack-year history of smoking and currently smoke or have quit within the past 15 years. Fecal occult blood test (FOBT) of the stool. You may have this test every year starting at age 6. Flexible sigmoidoscopy or colonoscopy. You may have a sigmoidoscopy every 5 years or a colonoscopy every 10 years starting at age 61. Hepatitis C blood test. Hepatitis B blood test. Sexually transmitted disease (STD) testing. Diabetes screening. This is done by checking your blood sugar (glucose) after you have not eaten for a while (fasting). You may have this done every 1-3 years. Bone density scan. This is done to screen for osteoporosis. You may have this done starting at age 3. Mammogram. This may be done every 1-2  years. Talk to your health care provider about how often you should have regular mammograms. Talk with your health care provider about your test results, treatment options, and if necessary, the need for more tests. Vaccines  Your health care provider may recommend certain vaccines, such as: Influenza vaccine. This is recommended every year. Tetanus, diphtheria,  and acellular pertussis (Tdap, Td) vaccine. You may need a Td booster every 10 years. Zoster vaccine. You may need this after age 30. Pneumococcal 13-valent conjugate (PCV13) vaccine. One dose is recommended after age 56. Pneumococcal polysaccharide (PPSV23) vaccine. One dose is recommended after age 3. Talk to your health care provider about which screenings and vaccines you need and how often you need them. This information is not intended to replace advice given to you by your health care provider. Make sure you discuss any questions you have with your health care provider. Document Released: 02/06/2015 Document Revised: 09/30/2015 Document Reviewed: 11/11/2014 Elsevier Interactive Patient Education  2017 [place] Prevention in the Home Falls can cause injuries. They can happen to people of all ages. There are many things you can do to make your home safe and to help prevent falls. What can I do on the outside of my home? Regularly fix the edges of walkways and driveways and fix any cracks. Remove anything that might make you trip as you walk through a door, such as a raised step or threshold. Trim any bushes or trees on the path to your home. Use bright outdoor lighting. Clear any walking paths of anything that might make someone trip, such as rocks or tools. Regularly check to see if handrails are loose or broken. Make sure that both sides of any steps have handrails. Any raised decks and porches should have guardrails on the edges. Have any leaves, snow, or ice cleared regularly. Use sand or salt on walking paths during winter. Clean up any spills in your garage right away. This includes oil or grease spills. What can I do in the bathroom? Use night lights. Install grab bars by the toilet and in the tub and shower. Do not use towel bars as grab bars. Use non-skid mats or decals in the tub or shower. If you need to sit down in the shower, use a plastic, non-slip  stool. Keep the floor dry. Clean up any water that spills on the floor as soon as it happens. Remove soap buildup in the tub or shower regularly. Attach bath mats securely with double-sided non-slip rug tape. Do not have throw rugs and other things on the floor that can make you trip. What can I do in the bedroom? Use night lights. Make sure that you have a light by your bed that is easy to reach. Do not use any sheets or blankets that are too big for your bed. They should not hang down onto the floor. Have a firm chair that has side arms. You can use this for support while you get dressed. Do not have throw rugs and other things on the floor that can make you trip. What can I do in the kitchen? Clean up any spills right away. Avoid walking on wet floors. Keep items that you use a lot in easy-to-reach places. If you need to reach something above you, use a strong step stool that has a grab bar. Keep electrical cords out of the way. Do not use floor polish or wax that makes floors slippery. If you must use wax, use non-skid floor  wax. Do not have throw rugs and other things on the floor that can make you trip. What can I do with my stairs? Do not leave any items on the stairs. Make sure that there are handrails on both sides of the stairs and use them. Fix handrails that are broken or loose. Make sure that handrails are as long as the stairways. Check any carpeting to make sure that it is firmly attached to the stairs. Fix any carpet that is loose or worn. Avoid having throw rugs at the top or bottom of the stairs. If you do have throw rugs, attach them to the floor with carpet tape. Make sure that you have a light switch at the top of the stairs and the bottom of the stairs. If you do not have them, ask someone to add them for you. What else can I do to help prevent falls? Wear shoes that: Do not have high heels. Have rubber bottoms. Are comfortable and fit you well. Are closed at the  toe. Do not wear sandals. If you use a stepladder: Make sure that it is fully opened. Do not climb a closed stepladder. Make sure that both sides of the stepladder are locked into place. Ask someone to hold it for you, if possible. Clearly mark and make sure that you can see: Any grab bars or handrails. First and last steps. Where the edge of each step is. Use tools that help you move around (mobility aids) if they are needed. These include: Canes. Walkers. Scooters. Crutches. Turn on the lights when you go into a dark area. Replace any light bulbs as soon as they burn out. Set up your furniture so you have a clear path. Avoid moving your furniture around. If any of your floors are uneven, fix them. If there are any pets around you, be aware of where they are. Review your medicines with your doctor. Some medicines can make you feel dizzy. This can increase your chance of falling. Ask your doctor what other things that you can do to help prevent falls. This information is not intended to replace advice given to you by your health care provider. Make sure you discuss any questions you have with your health care provider. Document Released: 11/06/2008 Document Revised: 06/18/2015 Document Reviewed: 02/14/2014 Elsevier Interactive Patient Education  2017 [employer].

## 2021-03-08 NOTE — Telephone Encounter (Signed)
[place] Information form to be filled out, placed in dr's folder.  Fax to: [phone], Attn: [employer]

## 2021-03-09 DIAGNOSIS — R2681 Unsteadiness on feet: Secondary | ICD-10-CM | POA: Diagnosis not present

## 2021-03-09 DIAGNOSIS — R42 Dizziness and giddiness: Secondary | ICD-10-CM | POA: Diagnosis not present

## 2021-03-09 LAB — COMPREHENSIVE METABOLIC PANEL
ALT: 14 U/L (ref 0–35)
AST: 25 U/L (ref 0–37)
Albumin: 4.8 g/dL (ref 3.5–5.2)
Alkaline Phosphatase: 56 U/L (ref 39–117)
BUN: 23 mg/dL (ref 6–23)
CO2: 28 mEq/L (ref 19–32)
Calcium: 10.6 mg/dL — ABNORMAL HIGH (ref 8.4–10.5)
Chloride: 96 mEq/L (ref 96–112)
Creatinine, Ser: 0.92 mg/dL (ref 0.40–1.20)
GFR: 60.54 mL/min (ref >60.00)
Glucose, Bld: 99 mg/dL (ref 70–99)
Potassium: 4.2 mEq/L (ref 3.5–5.1)
Sodium: 134 mEq/L — ABNORMAL LOW (ref 135–145)
Total Bilirubin: 0.5 mg/dL (ref 0.2–1.2)
Total Protein: 7.8 g/dL (ref 6.0–8.3)

## 2021-03-10 NOTE — Telephone Encounter (Signed)
Spoke with the patient and informed her the form was completed and faxed to [facility] at the number below.

## 2021-03-10 NOTE — Telephone Encounter (Signed)
Form returned to you 

## 2021-03-12 DIAGNOSIS — H40053 Ocular hypertension, bilateral: Secondary | ICD-10-CM | POA: Diagnosis not present

## 2021-03-12 DIAGNOSIS — D3132 Benign neoplasm of left choroid: Secondary | ICD-10-CM | POA: Diagnosis not present

## 2021-03-28 IMAGING — DX DG WRIST COMPLETE 3+V*R*
3 series · 3 of 3 positions shown · non-contrast
Comparison: None.

CLINICAL DATA: Right wrist and thenar pain, no known injury or
history of trauma

EXAM:
RIGHT WRIST - COMPLETE 3+ VIEW; RIGHT HAND - COMPLETE 3+ VIEW

[wrist pa]
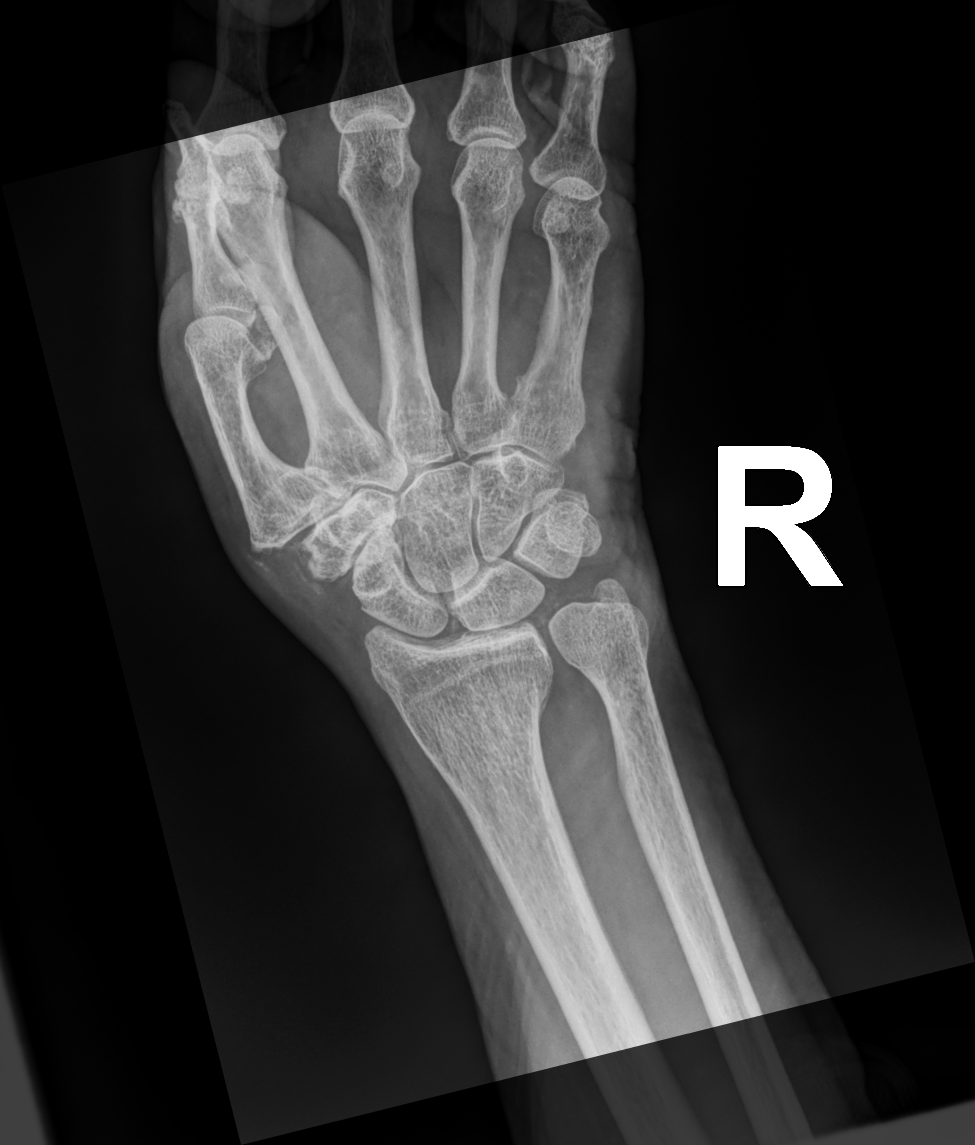

[wrist mlo]
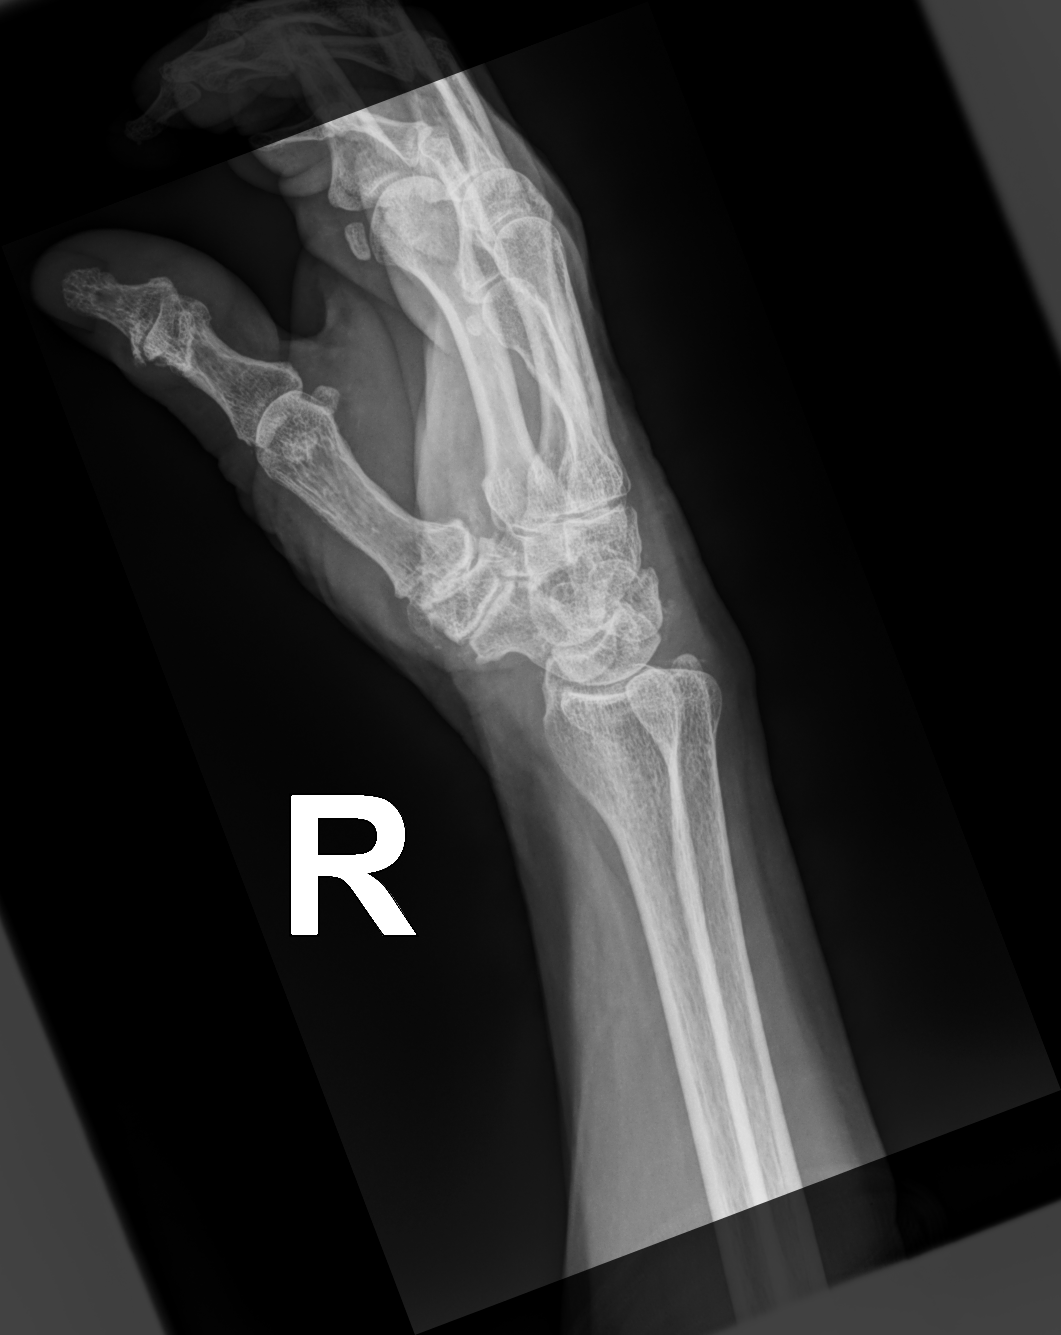

[wrist lat]
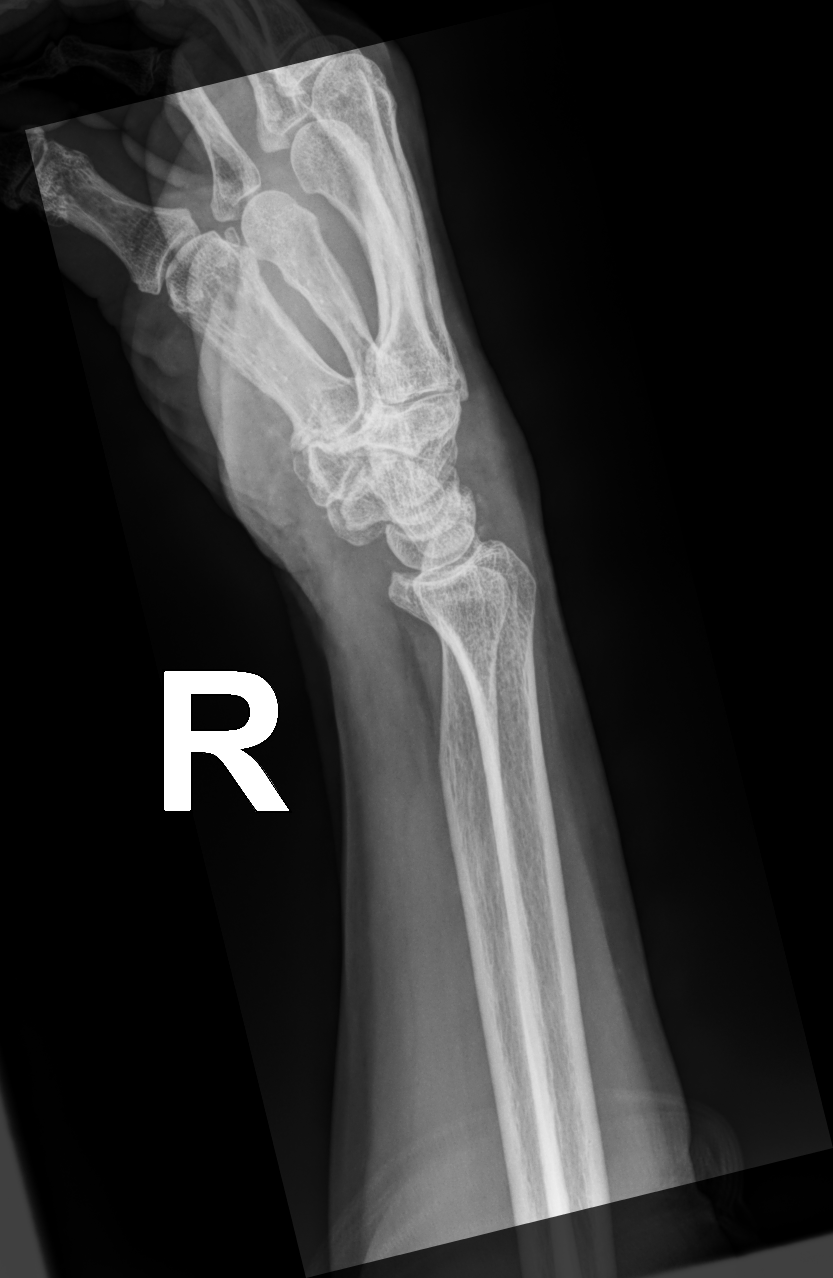

[3 of 3 positions shown; findings below may reference images not displayed]

FINDINGS: No fracture or dislocation of the right hand or wrist. There is
osteoarthritic pattern arthrosis about the hand and wrist, generally
mild, although focally severe at the thumb carpometacarpal joint and
STT joint. The carpus is normally aligned. Soft tissues are
unremarkable.
IMPRESSION: 1.  No fracture or dislocation of the right hand or wrist.

2. There is osteoarthritic pattern arthrosis about the hand and
wrist, generally mild, although focally severe at the thumb
carpometacarpal joint and STT joint.

## 2021-04-06 DIAGNOSIS — N2581 Secondary hyperparathyroidism of renal origin: Secondary | ICD-10-CM | POA: Diagnosis not present

## 2021-04-06 DIAGNOSIS — N390 Urinary tract infection, site not specified: Secondary | ICD-10-CM | POA: Diagnosis not present

## 2021-04-06 DIAGNOSIS — N182 Chronic kidney disease, stage 2 (mild): Secondary | ICD-10-CM | POA: Diagnosis not present

## 2021-04-08 DIAGNOSIS — M5030 Other cervical disc degeneration, unspecified cervical region: Secondary | ICD-10-CM | POA: Diagnosis not present

## 2021-04-08 DIAGNOSIS — M654 Radial styloid tenosynovitis [de Quervain]: Secondary | ICD-10-CM | POA: Diagnosis not present

## 2021-04-08 DIAGNOSIS — M810 Age-related osteoporosis without current pathological fracture: Secondary | ICD-10-CM | POA: Diagnosis not present

## 2021-04-08 DIAGNOSIS — M353 Polymyalgia rheumatica: Secondary | ICD-10-CM | POA: Diagnosis not present

## 2021-04-08 DIAGNOSIS — M25551 Pain in right hip: Secondary | ICD-10-CM | POA: Diagnosis not present

## 2021-04-08 DIAGNOSIS — Z6821 Body mass index (BMI) 21.0-21.9, adult: Secondary | ICD-10-CM | POA: Diagnosis not present

## 2021-04-08 DIAGNOSIS — M1991 Primary osteoarthritis, unspecified site: Secondary | ICD-10-CM | POA: Diagnosis not present

## 2021-04-09 ENCOUNTER — Other Ambulatory Visit: Payer: Self-pay | Admitting: Interventional Cardiology

## 2021-04-13 DIAGNOSIS — F320 Major depressive disorder, single episode, mild: Secondary | ICD-10-CM | POA: Diagnosis not present

## 2021-04-14 DIAGNOSIS — N2581 Secondary hyperparathyroidism of renal origin: Secondary | ICD-10-CM | POA: Diagnosis not present

## 2021-04-14 DIAGNOSIS — N182 Chronic kidney disease, stage 2 (mild): Secondary | ICD-10-CM | POA: Diagnosis not present

## 2021-04-14 DIAGNOSIS — I129 Hypertensive chronic kidney disease with stage 1 through stage 4 chronic kidney disease, or unspecified chronic kidney disease: Secondary | ICD-10-CM | POA: Diagnosis not present

## 2021-05-13 DIAGNOSIS — M810 Age-related osteoporosis without current pathological fracture: Secondary | ICD-10-CM | POA: Diagnosis not present

## 2021-05-13 DIAGNOSIS — Z1231 Encounter for screening mammogram for malignant neoplasm of breast: Secondary | ICD-10-CM | POA: Diagnosis not present

## 2021-05-13 DIAGNOSIS — C50512 Malignant neoplasm of lower-outer quadrant of left female breast: Secondary | ICD-10-CM | POA: Diagnosis not present

## 2021-05-13 DIAGNOSIS — M353 Polymyalgia rheumatica: Secondary | ICD-10-CM | POA: Diagnosis not present

## 2021-05-13 DIAGNOSIS — Z9229 Personal history of other drug therapy: Secondary | ICD-10-CM | POA: Diagnosis not present

## 2021-05-13 DIAGNOSIS — Z170 Estrogen receptor positive status [ER+]: Secondary | ICD-10-CM | POA: Diagnosis not present

## 2021-05-13 DIAGNOSIS — N644 Mastodynia: Secondary | ICD-10-CM | POA: Diagnosis not present

## 2021-05-13 DIAGNOSIS — Z9189 Other specified personal risk factors, not elsewhere classified: Secondary | ICD-10-CM | POA: Diagnosis not present

## 2021-05-25 DIAGNOSIS — K921 Melena: Secondary | ICD-10-CM | POA: Diagnosis not present

## 2021-05-25 DIAGNOSIS — R5381 Other malaise: Secondary | ICD-10-CM | POA: Diagnosis not present

## 2021-05-25 DIAGNOSIS — R5383 Other fatigue: Secondary | ICD-10-CM | POA: Diagnosis not present

## 2021-05-27 DIAGNOSIS — R143 Flatulence: Secondary | ICD-10-CM | POA: Diagnosis not present

## 2021-05-27 DIAGNOSIS — T887XXA Unspecified adverse effect of drug or medicament, initial encounter: Secondary | ICD-10-CM | POA: Diagnosis not present

## 2021-05-29 ENCOUNTER — Other Ambulatory Visit: Payer: Self-pay | Admitting: Family Medicine

## 2021-05-31 ENCOUNTER — Ambulatory Visit (INDEPENDENT_AMBULATORY_CARE_PROVIDER_SITE_OTHER): Payer: Medicare Other | Admitting: Family Medicine

## 2021-05-31 VITALS — BP 112/70 | HR 73 | Temp 98.1°F | Ht <= 58 in | Wt 97.7 lb

## 2021-05-31 DIAGNOSIS — E7849 Other hyperlipidemia: Secondary | ICD-10-CM

## 2021-05-31 DIAGNOSIS — I10 Essential (primary) hypertension: Secondary | ICD-10-CM

## 2021-05-31 DIAGNOSIS — H832X9 Labyrinthine dysfunction, unspecified ear: Secondary | ICD-10-CM | POA: Diagnosis not present

## 2021-05-31 LAB — COMPREHENSIVE METABOLIC PANEL
ALT: 14 U/L (ref 0–35)
AST: 25 U/L (ref 0–37)
Albumin: 4.7 g/dL (ref 3.5–5.2)
Alkaline Phosphatase: 59 U/L (ref 39–117)
BUN: 21 mg/dL (ref 6–23)
CO2: 26 mEq/L (ref 19–32)
Calcium: 10.1 mg/dL (ref 8.4–10.5)
Chloride: 95 mEq/L — ABNORMAL LOW (ref 96–112)
Creatinine, Ser: 0.87 mg/dL (ref 0.40–1.20)
GFR: 64.64 mL/min (ref >60.00)
Glucose, Bld: 92 mg/dL (ref 70–99)
Potassium: 4.2 mEq/L (ref 3.5–5.1)
Sodium: 134 mEq/L — ABNORMAL LOW (ref 135–145)
Total Bilirubin: 0.5 mg/dL (ref 0.2–1.2)
Total Protein: 8 g/dL (ref 6.0–8.3)

## 2021-05-31 LAB — CBC WITH DIFFERENTIAL/PLATELET
Basophils Absolute: 0 10*3/uL (ref 0.0–0.1)
Basophils Relative: 0.5 % (ref 0.0–3.0)
Eosinophils Absolute: 0.2 10*3/uL (ref 0.0–0.7)
Eosinophils Relative: 3.2 % (ref 0.0–5.0)
HCT: 40.3 % (ref 36.0–46.0)
Hemoglobin: 13.5 g/dL (ref 12.0–15.0)
Lymphocytes Relative: 22.7 % (ref 12.0–46.0)
Lymphs Abs: 1.3 10*3/uL (ref 0.7–4.0)
MCHC: 33.5 g/dL (ref 30.0–36.0)
MCV: 100 fl (ref 78.0–100.0)
Monocytes Absolute: 0.7 10*3/uL (ref 0.1–1.0)
Monocytes Relative: 11.9 % (ref 3.0–12.0)
Neutro Abs: 3.4 10*3/uL (ref 1.4–7.7)
Neutrophils Relative %: 61.7 % (ref 43.0–77.0)
Platelets: 256 10*3/uL (ref 150.0–400.0)
RBC: 4.03 Mil/uL (ref 3.87–5.11)
RDW: 13.4 % (ref 11.5–15.5)
WBC: 5.6 10*3/uL (ref 4.0–10.5)

## 2021-05-31 NOTE — Progress Notes (Signed)
?GWYNNETH FABIO ?DOB: 05/23/1945 ?Encounter date: 05/31/2021 ? ?This is a 76 y.o. female who presents with ?Chief Complaint  ?Patient presents with  ? Follow-up  ? ? ?History of present illness: ?After last visit had appointment with neuro otolaryngologist at [facility] for ongoing dizziness. Per notes it was suggested she try VRT but she was resistant and didn't want referral; she was not confident that it would be helpful. (Comments from neuro ent: Comments by Dr. Amado Coe: The slow saccades can be a normal age variant  The posturagraphy suggests vestibulospinal tract weakness which would respond very well to VRT.  The absent VEMP is normal for age also.  I think she will really benefit from VRT.) with this weather, she is not good from this standpoint.  ? ?At last visit they were moving into assisted living and their site is building doc office right underneath them. They will be transitioning to that provider. Gum has been swollen for 3 weeks.no pain with eating. Just hurts to the touch. She was worried that there was infection in her body. Didn't want to see dentist due to all of work that she is going to hear that she needs done. Not too bad all the time. Waxes and wanes. Using amubsol when tender. was washing with hydrogen peroxide. ? ?Follows with dermatology yearly, Dr. Elvera Lennox.  ?Sees Dr. Posey Pronto for nephrology management.  ?Follows with Dr. Tamala Julian for cardiology care. Also meeting with pharmacy in cardiology for lipid management.  Currently taking Praluent for cholesterol control. ?Follows with Dr. Fuller Plan for GI management.  Last colonoscopy was 09/2017.  Diverticulosis noted in the left colon at that time.  Constipation has improved.  Due for repeat colonoscopy 09/2022 due to family history. ?  ?Last mammogram 08/14/2020 was normal. ? ?Last bone density was 02/14/2020.  Osteoporosis is managed by rheumatology,Dr. Amil Amen. She is on prolia, vitamin D.  ? ?Only takes the pantoprazole if sx are bad; but does take the  famotidine.  ? ?Started probiotic which has helped with flatulance.  ? ? ?Allergies  ?Allergen Reactions  ? Tylenol [Acetaminophen] Cough  ? Turmeric   ?  Lowers BP  ? Erythromycin   ?  Stomach cramps  ? Erythromycin Base Other (See Comments)  ?  Stomach issues  ? Other   ?  Beta-blockers-patient states it makes her crazy  ? Sulfa Antibiotics Other (See Comments)  ?  Doesn't recall  ? ?Current Meds  ?Medication Sig  ? amLODipine (NORVASC) 5 MG tablet TAKE 1 TABLET BY MOUTH EVERY DAY  ? aspirin 81 MG tablet Take 81 mg by mouth daily.  ? cetirizine (ZYRTEC) 10 MG tablet Take 1 tablet (10 mg total) by mouth 2 (two) times daily.  ? denosumab (PROLIA) 60 MG/ML SOLN injection Inject 60 mg into the skin every 6 (six) months. Administer in upper arm, thigh, or abdomen  ? famotidine (PEPCID) 40 MG tablet Take 1 tablet (40 mg total) by mouth at bedtime.  ? fluticasone (FLONASE) 50 MCG/ACT nasal spray Place 2 sprays into both nostrils daily.  ? furosemide (LASIX) 20 MG tablet Take 20 mg by mouth daily.  ? LORazepam (ATIVAN) 1 MG tablet Take 1 mg by mouth at bedtime as needed (spasms).  ? MAGNESIUM PO Take 400 mg by mouth every 3 (three) days.  ? PANTOPRAZOLE SODIUM PO Take by mouth.  ? PRALUENT 150 MG/ML SOAJ INJECT 1 PEN INTO THE SKIN EVERY 14 (FOURTEEN) DAYS.  ? VITAMIN D PO Take by mouth.  ? ? ?  Review of Systems  ?Constitutional:  Negative for chills, fatigue and fever.  ?Respiratory:  Negative for cough, chest tightness, shortness of breath and wheezing.   ?Cardiovascular:  Negative for chest pain, palpitations and leg swelling.  ?Neurological:  Positive for light-headedness and numbness.  ? ?Objective: ? ?BP 112/70 (BP Location: Right Arm, Patient Position: Sitting, Cuff Size: Normal)   Pulse 73   Temp 98.1 ?F (36.7 ?C) (Oral)   Ht '4\' 8"'$  (1.422 m)   Wt 97 lb 11.2 oz (44.3 kg)   SpO2 97%   BMI 21.90 kg/m?   Weight: 97 lb 11.2 oz (44.3 kg)  ? ?BP Readings from Last 3 Encounters:  ?05/31/21 112/70  ?03/08/21 122/62   ?02/18/21 124/60  ? ?Wt Readings from Last 3 Encounters:  ?05/31/21 97 lb 11.2 oz (44.3 kg)  ?03/08/21 95 lb 12.8 oz (43.5 kg)  ?02/18/21 96 lb 9.6 oz (43.8 kg)  ? ? ?Physical Exam ?Constitutional:   ?   General: She is not in acute distress. ?   Appearance: She is well-developed.  ?Cardiovascular:  ?   Rate and Rhythm: Normal rate and regular rhythm.  ?   Heart sounds: Normal heart sounds. No murmur heard. ?  No friction rub.  ?Pulmonary:  ?   Effort: Pulmonary effort is normal. No respiratory distress.  ?   Breath sounds: Normal breath sounds. No wheezing or rales.  ?Musculoskeletal:  ?   Right lower leg: No edema.  ?   Left lower leg: No edema.  ?Neurological:  ?   Mental Status: She is alert and oriented to person, place, and time.  ?Psychiatric:     ?   Behavior: Behavior normal.  ? ? ?Assessment/Plan ? ? ?1. Essential hypertension ?Blood pressure has been stable.  Continue with current medications. ?- Comprehensive metabolic panel; Future ?- CBC with Differential/Platelet; Future ?- CBC with Differential/Platelet ?- Comprehensive metabolic panel ? ?2. Hypercalcemia ?We will recheck calcium levels as these were elevated on her recent blood work.  In the past, she has been fairly stable with levels. ?- PTH, Intact and Calcium ? ?3. Vestibular disequilibrium, unspecified laterality ?She was encouraged to call back to the neurologist at [facility] and ask for recommendations for vestibular rehab that was suggested by the neuro otolaryngologist.  I emphasized his comments that vestibular rehab should be helpful given his review of her imaging. ? ?4. Other hyperlipidemia ?Has been stable on Praluent.  Follows with lipid clinic. ? ?Return if symptoms worsen or fail to improve. ? ? ? ?Micheline Rough, MD ?

## 2021-05-31 NOTE — Patient Instructions (Signed)
*  you can call back to neuro and ask Dr. Magnus Sinning staff to help you with getting feedback from Dr. Amado Coe (the neuro-ENT) who suggested VRT. Ask Dr. Kathi Simpers staff what local options they would recommend and if there is something you can remotely? Or if they have closer office with a good provider they know.  ?

## 2021-06-01 ENCOUNTER — Telehealth: Payer: Medicare Other | Admitting: Family Medicine

## 2021-06-01 DIAGNOSIS — L814 Other melanin hyperpigmentation: Secondary | ICD-10-CM | POA: Diagnosis not present

## 2021-06-01 DIAGNOSIS — L821 Other seborrheic keratosis: Secondary | ICD-10-CM | POA: Diagnosis not present

## 2021-06-01 DIAGNOSIS — D692 Other nonthrombocytopenic purpura: Secondary | ICD-10-CM | POA: Diagnosis not present

## 2021-06-01 DIAGNOSIS — Z85828 Personal history of other malignant neoplasm of skin: Secondary | ICD-10-CM | POA: Diagnosis not present

## 2021-06-01 DIAGNOSIS — L570 Actinic keratosis: Secondary | ICD-10-CM | POA: Diagnosis not present

## 2021-06-01 LAB — PTH, INTACT AND CALCIUM
Calcium: 10.4 mg/dL (ref 8.6–10.4)
PTH: 47 pg/mL (ref 16–77)

## 2021-06-14 ENCOUNTER — Ambulatory Visit: Payer: Medicare Other | Admitting: Family Medicine

## 2021-06-22 ENCOUNTER — Ambulatory Visit (INDEPENDENT_AMBULATORY_CARE_PROVIDER_SITE_OTHER): Payer: Medicare Other | Admitting: Podiatry

## 2021-06-22 ENCOUNTER — Encounter: Payer: Self-pay | Admitting: Podiatry

## 2021-06-22 ENCOUNTER — Ambulatory Visit: Payer: Medicare Other

## 2021-06-22 DIAGNOSIS — M5030 Other cervical disc degeneration, unspecified cervical region: Secondary | ICD-10-CM | POA: Insufficient documentation

## 2021-06-22 DIAGNOSIS — Z23 Encounter for immunization: Secondary | ICD-10-CM | POA: Diagnosis not present

## 2021-06-22 DIAGNOSIS — M7742 Metatarsalgia, left foot: Secondary | ICD-10-CM | POA: Diagnosis not present

## 2021-06-22 DIAGNOSIS — M2041 Other hammer toe(s) (acquired), right foot: Secondary | ICD-10-CM | POA: Diagnosis not present

## 2021-06-22 DIAGNOSIS — M21611 Bunion of right foot: Secondary | ICD-10-CM | POA: Diagnosis not present

## 2021-06-22 DIAGNOSIS — M2042 Other hammer toe(s) (acquired), left foot: Secondary | ICD-10-CM | POA: Diagnosis not present

## 2021-06-22 DIAGNOSIS — M21612 Bunion of left foot: Secondary | ICD-10-CM

## 2021-06-22 DIAGNOSIS — M7741 Metatarsalgia, right foot: Secondary | ICD-10-CM

## 2021-06-22 DIAGNOSIS — Z9229 Personal history of other drug therapy: Secondary | ICD-10-CM | POA: Insufficient documentation

## 2021-06-22 HISTORY — DX: Other cervical disc degeneration, unspecified cervical region: M50.30

## 2021-06-22 NOTE — Progress Notes (Signed)
  Subjective:  Patient ID: Amanda Cannon, female    DOB: Dec 16, 1945,  MRN: [id number]  Chief Complaint  Patient presents with   Bunions     NP- L foot Toe crosses, L & R Bunion. L foot numbesss, L& R corns   Hammer Toe   Callouses    76 y.o. female presents with the above complaint. History confirmed with patient.  She has a callus on the left fifth toe which sometimes is painful.  Also has some pain over the bunion on the left foot as well.  She has difficulty finding shoes that fit correctly as she has very small feet.  She has numbness under the left third fourth toes  Objective:  Physical Exam: warm, good capillary refill, no trophic changes or ulcerative lesions, normal DP and PT pulses, normal sensory exam, and bilaterally she has semireducible hammertoe contractures with prominence of the metatarsal heads plantarly, tailor's bunion deformity, bunion over the medial first MTPJ and adductovarus fifth toe with corn..  Assessment:   1. Bilateral bunions   2. Hammertoes of both feet   3. Metatarsalgia of both feet      Plan:  Patient was evaluated and treated and all questions answered.  Discussed the etiology and treatment including surgical and non surgical treatment for painful bunions, tailor's bunion, and hammertoes.  We also discussed how this leads to increased plantar pressure and metatarsalgia as well as the thinning of the fat pad.  I recommended non surgical treatment including shoe gear changes and padding.  Silicone offloading pads and metatarsal pads were dispensed.   We also discussed shoe gear that is comfortable and not tapering.  She is already had some of this with new sketchers that have improved her pain.Marland Kitchen  She will return to see me as needed.  Return if symptoms worsen or fail to improve.

## 2021-06-22 NOTE — Patient Instructions (Signed)
More silicone toe caps, and felt metatarsal pads can be purchased from:  [employer] care section at pharmacy  [email or web address]

## 2021-07-01 ENCOUNTER — Other Ambulatory Visit: Payer: Self-pay | Admitting: Internal Medicine

## 2021-07-01 DIAGNOSIS — Z1231 Encounter for screening mammogram for malignant neoplasm of breast: Secondary | ICD-10-CM

## 2021-07-07 DIAGNOSIS — J343 Hypertrophy of nasal turbinates: Secondary | ICD-10-CM | POA: Diagnosis not present

## 2021-07-07 DIAGNOSIS — J342 Deviated nasal septum: Secondary | ICD-10-CM | POA: Diagnosis not present

## 2021-07-07 DIAGNOSIS — H903 Sensorineural hearing loss, bilateral: Secondary | ICD-10-CM | POA: Diagnosis not present

## 2021-07-07 DIAGNOSIS — H838X3 Other specified diseases of inner ear, bilateral: Secondary | ICD-10-CM | POA: Diagnosis not present

## 2021-07-07 DIAGNOSIS — J310 Chronic rhinitis: Secondary | ICD-10-CM | POA: Diagnosis not present

## 2021-08-05 DIAGNOSIS — I10 Essential (primary) hypertension: Secondary | ICD-10-CM | POA: Diagnosis not present

## 2021-08-16 ENCOUNTER — Ambulatory Visit
Admission: RE | Admit: 2021-08-16 | Discharge: 2021-08-16 | Disposition: A | Discharge status: Home / Self Care | Payer: Medicare Other | Source: Ambulatory Visit | Attending: Internal Medicine | Admitting: Internal Medicine

## 2021-08-16 DIAGNOSIS — Z1231 Encounter for screening mammogram for malignant neoplasm of breast: Secondary | ICD-10-CM | POA: Diagnosis not present

## 2021-08-18 IMAGING — MG MM DIGITAL SCREENING BILAT W/ TOMO AND CAD
8 series · 9 of 24 positions shown · non-contrast
Comparison: Previous exam(s).

CLINICAL DATA: Screening.

EXAM:
DIGITAL SCREENING BILATERAL MAMMOGRAM WITH TOMOSYNTHESIS AND CAD
TECHNIQUE: Bilateral screening digital craniocaudal and mediolateral oblique
mammograms were obtained. Bilateral screening digital breast
tomosynthesis was performed. The images were evaluated with
computer-aided detection.

[L MLO synth-2D]
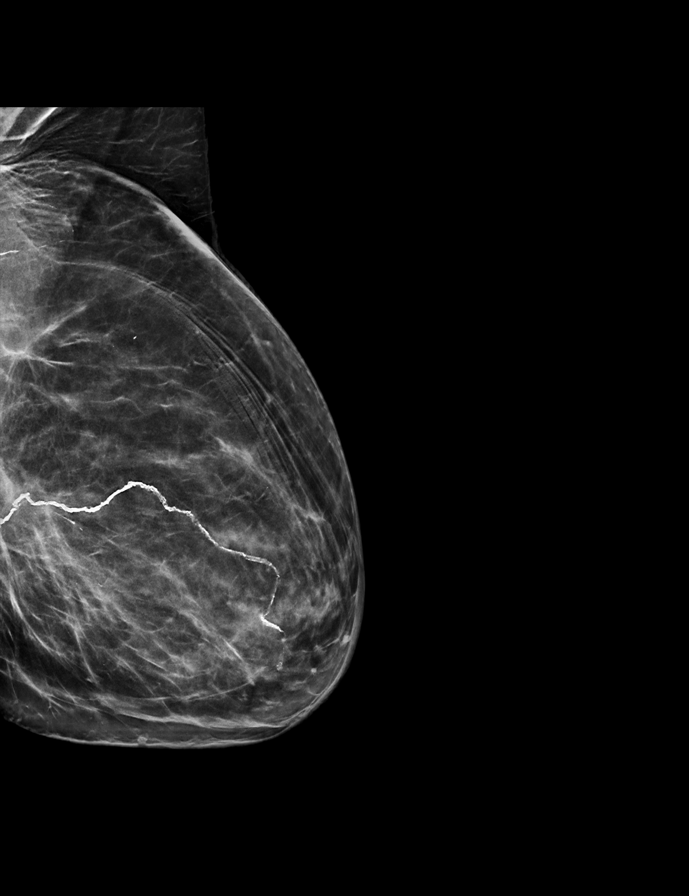

[R CC synth-2D]
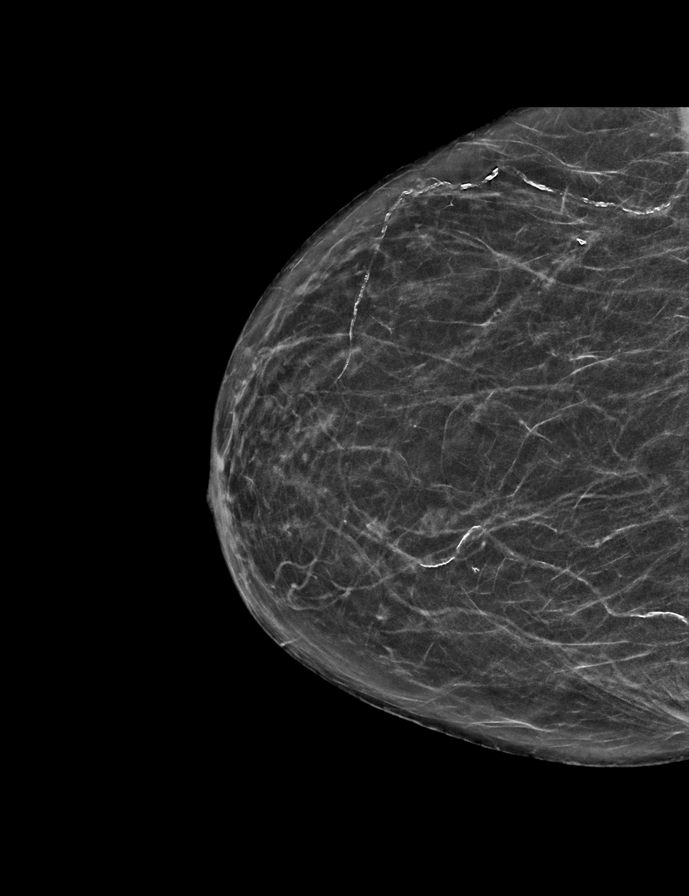

[L CC synth-2D]
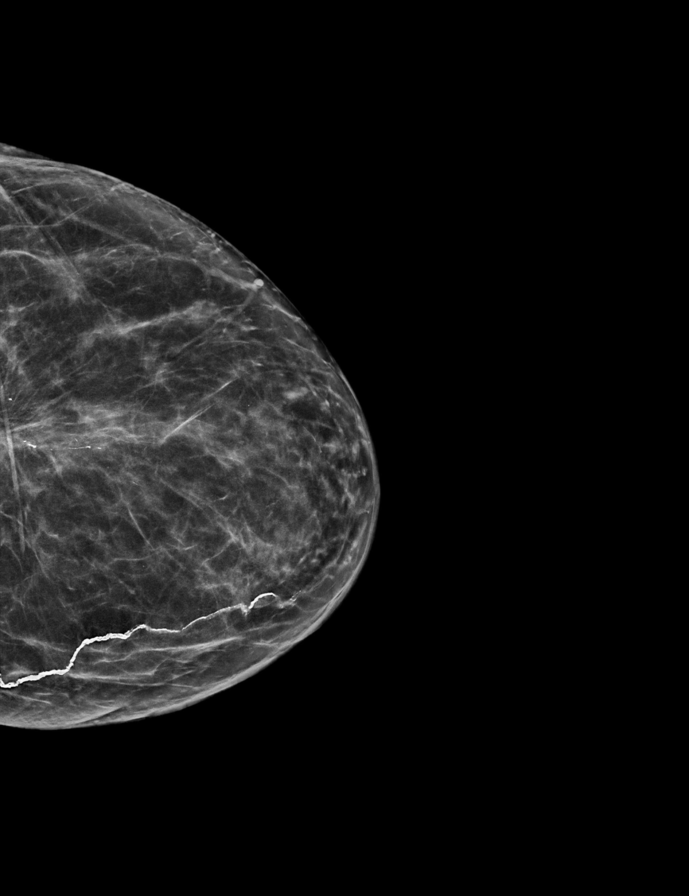

[R MLO synth-2D]
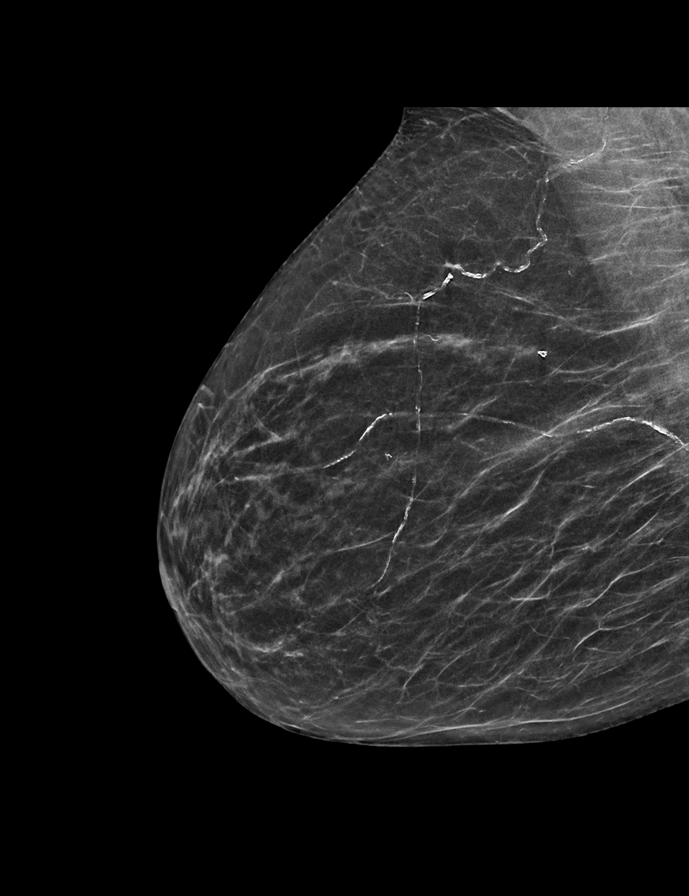

[R CC tomo · 2 of 49 frames shown]
[frame 16/49]
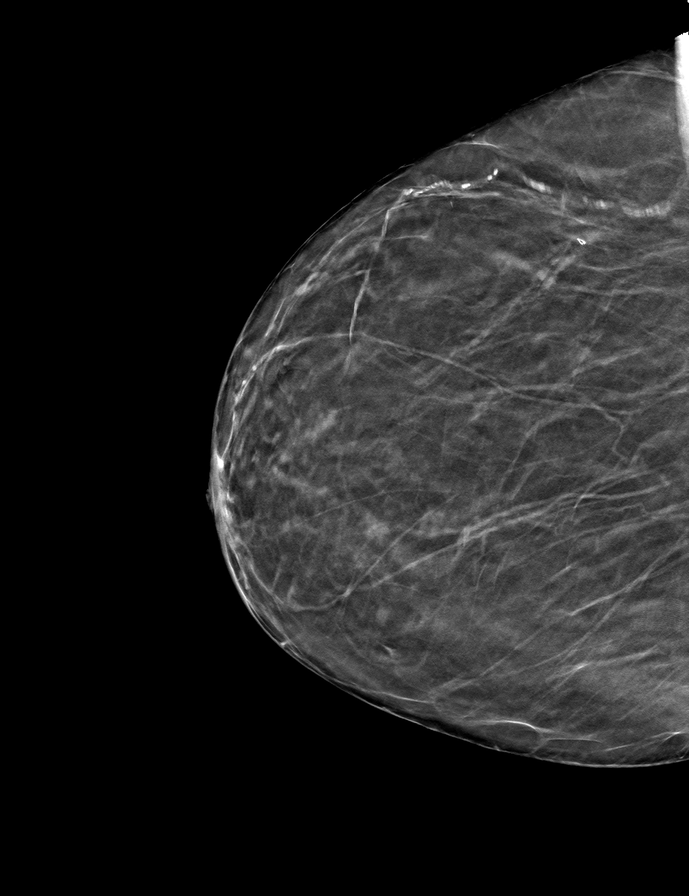
[frame 25/49]
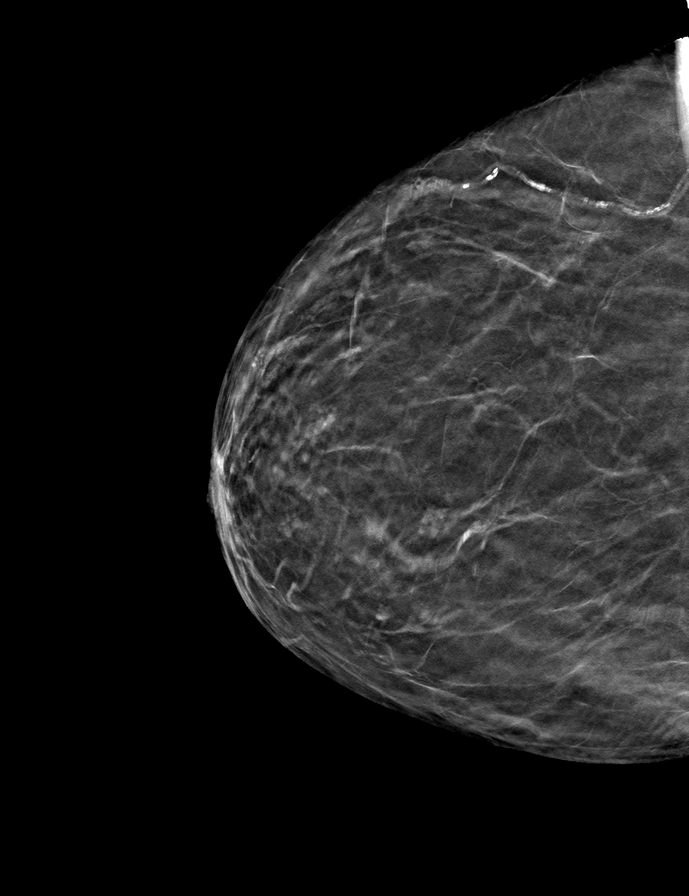

[L CC tomo · tomo slice 29/57.0]
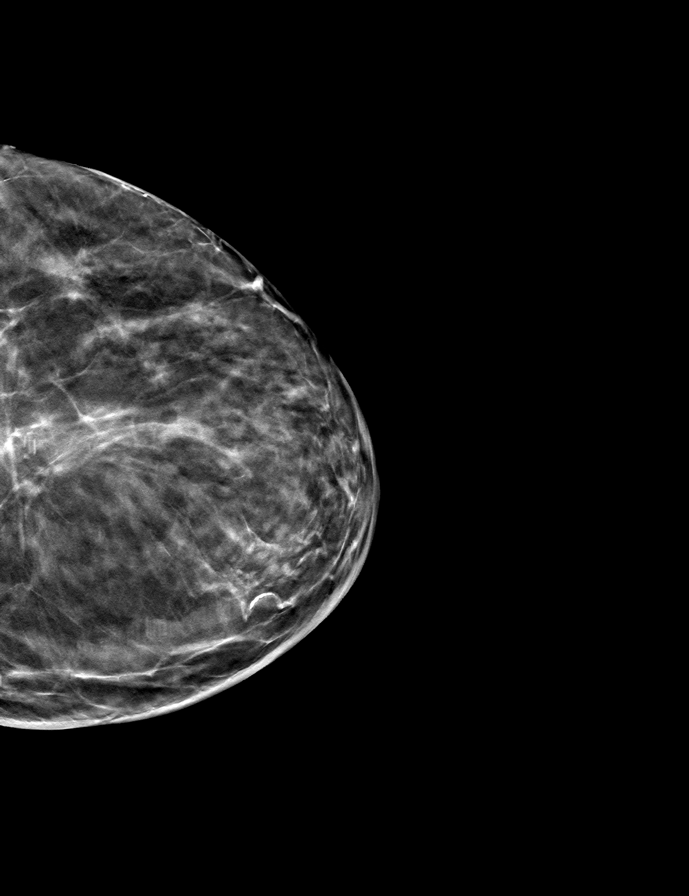

[R MLO tomo · tomo slice 23/44.0]
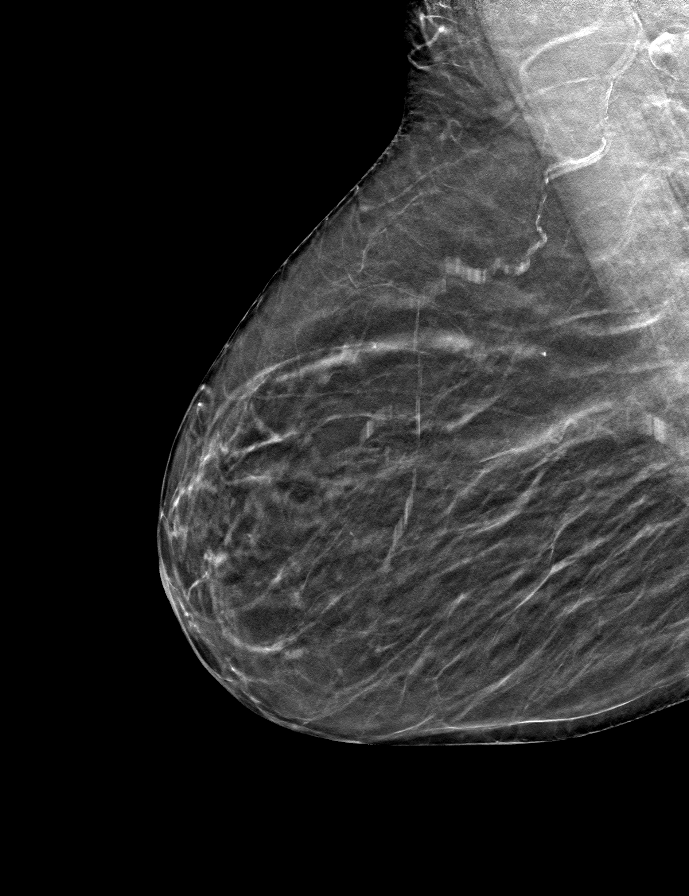

[L MLO tomo · tomo slice 33/64.0]
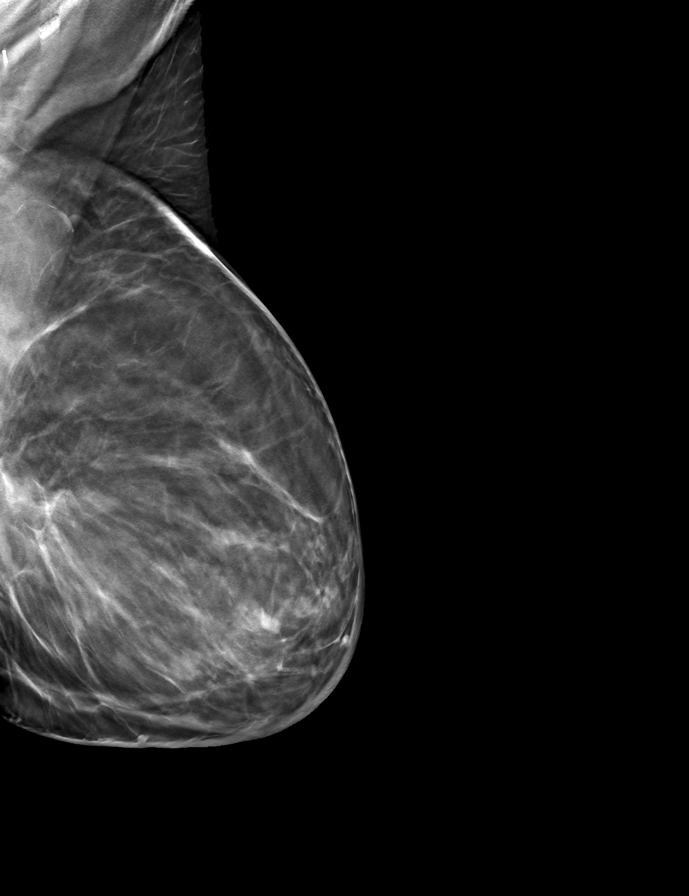

[9 of 24 positions shown; findings below may reference images not displayed]

ACR Breast Density Category b: There are scattered areas of
fibroglandular density.
FINDINGS: There are no findings suspicious for malignancy.
IMPRESSION: No mammographic evidence of malignancy. A result letter of this
screening mammogram will be mailed directly to the patient.

RECOMMENDATION:
Screening mammogram in one year. (Code:51-O-LD2)

BI-RADS CATEGORY  1: Negative.

## 2021-08-26 ENCOUNTER — Other Ambulatory Visit: Payer: Self-pay | Admitting: Allergy & Immunology

## 2021-09-06 DIAGNOSIS — H832X9 Labyrinthine dysfunction, unspecified ear: Secondary | ICD-10-CM

## 2021-09-06 DIAGNOSIS — R2681 Unsteadiness on feet: Secondary | ICD-10-CM | POA: Diagnosis not present

## 2021-09-06 DIAGNOSIS — H903 Sensorineural hearing loss, bilateral: Secondary | ICD-10-CM | POA: Diagnosis not present

## 2021-09-06 HISTORY — DX: Labyrinthine dysfunction, unspecified ear: H83.2X9

## 2021-09-13 DIAGNOSIS — Z961 Presence of intraocular lens: Secondary | ICD-10-CM | POA: Diagnosis not present

## 2021-09-13 DIAGNOSIS — H40053 Ocular hypertension, bilateral: Secondary | ICD-10-CM | POA: Diagnosis not present

## 2021-09-14 DIAGNOSIS — F320 Major depressive disorder, single episode, mild: Secondary | ICD-10-CM | POA: Diagnosis not present

## 2021-10-14 DIAGNOSIS — M654 Radial styloid tenosynovitis [de Quervain]: Secondary | ICD-10-CM | POA: Diagnosis not present

## 2021-10-14 DIAGNOSIS — M25551 Pain in right hip: Secondary | ICD-10-CM | POA: Diagnosis not present

## 2021-10-14 DIAGNOSIS — M353 Polymyalgia rheumatica: Secondary | ICD-10-CM | POA: Diagnosis not present

## 2021-10-14 DIAGNOSIS — Z23 Encounter for immunization: Secondary | ICD-10-CM | POA: Diagnosis not present

## 2021-10-14 DIAGNOSIS — M810 Age-related osteoporosis without current pathological fracture: Secondary | ICD-10-CM | POA: Diagnosis not present

## 2021-10-14 DIAGNOSIS — M5030 Other cervical disc degeneration, unspecified cervical region: Secondary | ICD-10-CM | POA: Diagnosis not present

## 2021-10-14 DIAGNOSIS — Z6821 Body mass index (BMI) 21.0-21.9, adult: Secondary | ICD-10-CM | POA: Diagnosis not present

## 2021-10-14 DIAGNOSIS — M1991 Primary osteoarthritis, unspecified site: Secondary | ICD-10-CM | POA: Diagnosis not present

## 2021-10-15 ENCOUNTER — Other Ambulatory Visit: Payer: Self-pay | Admitting: Internal Medicine

## 2021-10-15 DIAGNOSIS — M810 Age-related osteoporosis without current pathological fracture: Secondary | ICD-10-CM

## 2021-10-23 DIAGNOSIS — Z23 Encounter for immunization: Secondary | ICD-10-CM | POA: Diagnosis not present

## 2021-10-25 DIAGNOSIS — H832X9 Labyrinthine dysfunction, unspecified ear: Secondary | ICD-10-CM | POA: Diagnosis not present

## 2021-10-25 DIAGNOSIS — H40053 Ocular hypertension, bilateral: Secondary | ICD-10-CM | POA: Diagnosis not present

## 2021-10-27 ENCOUNTER — Encounter: Payer: Self-pay | Admitting: Interventional Cardiology

## 2021-10-27 DIAGNOSIS — E7849 Other hyperlipidemia: Secondary | ICD-10-CM

## 2021-10-27 DIAGNOSIS — I6523 Occlusion and stenosis of bilateral carotid arteries: Secondary | ICD-10-CM

## 2021-10-28 ENCOUNTER — Encounter: Payer: Self-pay | Admitting: Interventional Cardiology

## 2021-11-03 NOTE — Addendum Note (Signed)
Addended by: Molli Barrows on: 11/03/2021 08:44 AM   Modules accepted: Orders

## 2021-11-07 NOTE — Progress Notes (Signed)
Cardiology Office Note:    Date:  11/09/2021   ID:  Amanda Cannon, DOB 10-02-1945, MRN [id number]  PCP:  [name], MD (Inactive)  Cardiologist:  Sinclair Grooms, MD   Referring MD: No ref. provider found   Chief Complaint  Patient presents with   Follow-up    Angina with nonobstructive coronary arteries (ANOCA  v CA spasm)    History of Present Illness:    Amanda Cannon is a 76 y.o. female with a hx of hypertension, polymyalgia rheumatica. hyperlipidemia, Prinzmetal's Angina, and less than 39% bilateral carotid obstruction 2018.   Not really having any specific cardiac complaints other than occasional palpitation.  No associated other complaints and episodes are momentary.  No nitroglycerin use has been required.  Greater than 30 minutes of moderate physical activity 5 days of the week without precipitating any chest discomfort or dyspnea.  Past Medical History:  Diagnosis Date   Alcohol use    Anemia    Anxiety    Asthma    Basal cell carcinoma    forehead   Breast cancer ([facility]) 1998   left    Colon polyps    GERD (gastroesophageal reflux disease)    Glaucoma    Hyperlipidemia    Hypertension    Hyponatremia    remote, mild   Mitral valve prolapse    Osteoarthritis    sees rheumatologist   Osteoporosis    sees rheumatologist   Personal history of radiation therapy 1998   PMR (polymyalgia rheumatica) ([place])    sees rheumatologist   Pneumonia    Prinzmetal angina ([place])    White matter changes    ? ischemic sm vs per pt    Past Surgical History:  Procedure Laterality Date   ABDOMINAL HYSTERECTOMY     BREAST LUMPECTOMY Left 1998   cancer   CESAREAN SECTION     x 4   COLONOSCOPY  07/2012   in [place]  2005   abdominal hernia repair   HYSTEROTOMY     LYMPH NODE DISSECTION Left    POLYPECTOMY      Current Medications: Current Meds  Medication Sig   amLODipine (NORVASC) 5 MG tablet TAKE 1 TABLET BY MOUTH EVERY DAY    aspirin 81 MG tablet Take 81 mg by mouth daily.   cetirizine (ZYRTEC) 10 MG tablet Take 1 tablet (10 mg total) by mouth 2 (two) times daily.   Cholecalciferol 25 MCG (1000 UT) tablet Take by mouth.   denosumab (PROLIA) 60 MG/ML SOLN injection Inject 60 mg into the skin every 6 (six) months. Administer in upper arm, thigh, or abdomen   famotidine (PEPCID) 40 MG tablet TAKE 1 TABLET BY MOUTH EVERYDAY AT BEDTIME   fluticasone (FLONASE) 50 MCG/ACT nasal spray Place 2 sprays into both nostrils daily.   furosemide (LASIX) 20 MG tablet Take 20 mg by mouth daily.   LORazepam (ATIVAN) 1 MG tablet Take 1 mg by mouth at bedtime as needed (spasms).   magnesium oxide (MAG-OX) 400 MG tablet Take by mouth.   naproxen sodium (ALEVE) 220 MG tablet 1 tablet as needed   PANTOPRAZOLE SODIUM PO Take by mouth.   PRALUENT 150 MG/ML SOAJ INJECT 1 PEN INTO THE SKIN EVERY 14 (FOURTEEN) DAYS.   PRESCRIPTION MEDICATION Take 1 tablet by mouth 2 (two) times daily. Betahistane     Allergies:   Tylenol [acetaminophen], Turmeric, Betamethasone, Erythromycin, Erythromycin base, Other, and Sulfa antibiotics   Social  History   Socioeconomic History   Marital status: Married    Spouse name: Amanda Cannon    Number of children: 3   Years of education: 13   Highest education level: Associate degree: occupational, Hotel manager, or vocational program  Occupational History   Occupation: Psychologist, occupational  Tobacco Use   Smoking status: Former    Packs/day: 1.00    Years: 7.00    Total pack years: 7.00    Types: Cigarettes    Quit date: 01/25/1971    Years since quitting: 50.8   Smokeless tobacco: Never  Vaping Use   Vaping Use: Never used  Substance and Sexual Activity   Alcohol use: Yes    Alcohol/week: 14.0 standard drinks of alcohol    Types: 14 Standard drinks or equivalent per week    Comment: two drinks with burbon + water daily    Drug use: No   Sexual activity: Yes  Other Topics Concern   Not on file  Social History  Narrative   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 Situation: lives with husband      Spiritual Beliefs: Jewish      Lifestyle: active, diet is good      3 children + grandchildren   1 cat    Social Determinants of Health   Financial Resource Strain: Low Risk  (05/31/2021)   Overall Financial Resource Strain (CARDIA)    Difficulty of Paying Living Expenses: Not hard at all  Food Insecurity: No Food Insecurity (05/31/2021)   Hunger Vital Sign    Worried About Running Out of Food in the Last Year: Never true    [place] in the Last Year: Never true  Transportation Needs: No Transportation Needs (05/31/2021)   PRAPARE - Hydrologist (Medical): No    Lack of Transportation (Non-Medical): No  Physical Activity: Sufficiently Active (05/31/2021)   Exercise Vital Sign    Days of Exercise per Week: 5 days    Minutes of Exercise per Session: 40 min  Stress: No Stress Concern Present (05/31/2021)   [place]    Feeling of Stress : Not at all  Social Connections: [place] (05/31/2021)   Social Connection and Isolation Panel [NHANES]    Frequency of Communication with Friends and Family: More than three times a week    Frequency of Social Gatherings with Friends and Family: More than three times a week    Attends Religious Services: 1 to 4 times per year    Active Member of [employer] or Organizations: No    Attends Music therapist: More than 4 times per year    Marital Status: Married     Family History: The patient's family history includes Cancer - Ovarian in her sister; Colon cancer in her cousin; Coronary artery disease (age of onset: 35) in her father; Coronary artery disease (age of onset: 62) in her mother. There is no history of Esophageal cancer, Rectal cancer, or Stomach cancer.  ROS:   Please see the history of  present illness.    Vestibular issues that are being evaluated at [facility].  She has imbalance due to this problem.  All other systems reviewed and are negative.  EKGs/Labs/Other Studies Reviewed:    The following studies were reviewed today: No recent cardiac imaging:  2018 Carotid Doppler: Technically challenging study. Heterogeneous plaque, bilaterally. 1-39%  bilateral ICA stenosis. Normal subclavian arteries, bilaterally. Patent vertebral arteries with antegrade flow.  EKG:  EKG normal sinus rhythm with normal EKG appearance.  Mild T wave abnormality.  No significant change compared to September 2022.  Recent Labs: 05/31/2021: ALT 14; BUN 21; Creatinine, Ser 0.87; Hemoglobin 13.5; Platelets 256.0; Potassium 4.2; Sodium 134  Recent Lipid Panel    Component Value Date/Time   CHOL 201 (H) 12/07/2020 1134   CHOL 216 (H) 09/12/2018 1013   TRIG 118.0 12/07/2020 1134   TRIG 174 (H) 06/24/2016 0848   HDL 116.90 12/07/2020 1134   HDL 103 09/12/2018 1013   HDL 74 06/24/2016 0848   CHOLHDL 2 12/07/2020 1134   VLDL 23.6 12/07/2020 1134   LDLCALC 60 12/07/2020 1134   LDLCALC 86 09/12/2018 1013    Physical Exam:    VS:  BP 134/70   Pulse 82   Ht '4\' 8"'$  (1.422 m)   Wt 101 lb 6.4 oz (46 kg)   SpO2 99%   BMI 22.73 kg/m     Wt Readings from Last 3 Encounters:  11/09/21 101 lb 6.4 oz (46 kg)  05/31/21 97 lb 11.2 oz (44.3 kg)  03/08/21 95 lb 12.8 oz (43.5 kg)     GEN: Short stature, good weight. No acute distress HEENT: Normal NECK: No JVD. LYMPHATICS: No lymphadenopathy CARDIAC: No murmur. RRR no gallop, or edema. VASCULAR:  Normal Pulses. No bruits. RESPIRATORY:  Clear to auscultation without rales, wheezing or rhonchi  ABDOMEN: Soft, non-tender, non-distended, No pulsatile mass, MUSCULOSKELETAL: No deformity  SKIN: Warm and dry NEUROLOGIC:  Alert and oriented x 3 PSYCHIATRIC:  Normal affect   ASSESSMENT:    1. Essential hypertension   2. Other  hyperlipidemia   3. Bilateral carotid artery stenosis   4. Mitral valve prolapse    PLAN:    In order of problems listed above:  Well-controlled on current therapy that includes amlodipine 5 mg/day and furosemide. Continue Praluent.  Target LDL less than 70.  Most recent value was 60 in November 22. Please see Doppler study from 2018.  She has an upcoming Doppler study as part of the work-up for her vestibular disease process. She is does not have mitral valve prolapse clinically.  Echo from his prior cardiologist from 2017 suggested mild mitral prolapse.  At some point an echo should be repeated.   She and her husband request Dr. Skeet Latch as their cardiologist going forward.  Will set them for 1 year follow-up   Medication Adjustments/Labs and Tests Ordered: Current medicines are reviewed at length with the patient today.  Concerns regarding medicines are outlined above.  Orders Placed This Encounter  Procedures   EKG 12-Lead   No orders of the defined types were placed in this encounter.   Patient Instructions  Medication Instructions:  Your physician recommends that you continue on your current medications as directed. Please refer to the Current Medication list given to you today.  *If you need a refill on your cardiac medications before your next appointment, please call your pharmacy*  Lab Work: NONE  Testing/Procedures: NONE  Follow-Up: At [facility], you and your health needs are our priority.  As part of our continuing mission to provide you with exceptional heart care, we have created designated Provider Care Teams.  These Care Teams include your primary Cardiologist (physician) and Advanced Practice Providers (APPs -  Physician Assistants and Nurse Practitioners) who all work together to provide you with the care you need, when  you need it.  Your next appointment:   1 year(s)  The format for your next appointment:   In Person  Provider:    Skeet Latch, MD   Important Information About Sugar         Signed, Sinclair Grooms, MD  11/09/2021 8:55 AM    [place]

## 2021-11-09 ENCOUNTER — Encounter: Payer: Self-pay | Admitting: Interventional Cardiology

## 2021-11-09 ENCOUNTER — Ambulatory Visit: Payer: Medicare Other | Attending: Interventional Cardiology | Admitting: Interventional Cardiology

## 2021-11-09 VITALS — BP 134/70 | HR 82 | Ht <= 58 in | Wt 101.4 lb

## 2021-11-09 DIAGNOSIS — E7849 Other hyperlipidemia: Secondary | ICD-10-CM | POA: Diagnosis not present

## 2021-11-09 DIAGNOSIS — I10 Essential (primary) hypertension: Secondary | ICD-10-CM | POA: Insufficient documentation

## 2021-11-09 DIAGNOSIS — I6523 Occlusion and stenosis of bilateral carotid arteries: Secondary | ICD-10-CM | POA: Insufficient documentation

## 2021-11-09 DIAGNOSIS — I341 Nonrheumatic mitral (valve) prolapse: Secondary | ICD-10-CM | POA: Insufficient documentation

## 2021-11-09 NOTE — Patient Instructions (Signed)
Medication Instructions:  Your physician recommends that you continue on your current medications as directed. Please refer to the Current Medication list given to you today.  *If you need a refill on your cardiac medications before your next appointment, please call your pharmacy*  Lab Work: NONE  Testing/Procedures: NONE  Follow-Up: At [facility], you and your health needs are our priority.  As part of our continuing mission to provide you with exceptional heart care, we have created designated Provider Care Teams.  These Care Teams include your primary Cardiologist (physician) and Advanced Practice Providers (APPs -  Physician Assistants and Nurse Practitioners) who all work together to provide you with the care you need, when you need it.  Your next appointment:   1 year(s)  The format for your next appointment:   In Person  Provider:   Skeet Latch, MD   Important Information About Sugar

## 2021-11-16 ENCOUNTER — Ambulatory Visit (HOSPITAL_COMMUNITY)
Admission: RE | Admit: 2021-11-16 | Discharge: 2021-11-16 | Disposition: A | Discharge status: Home / Self Care | Payer: Medicare Other | Source: Ambulatory Visit | Attending: Interventional Cardiology | Admitting: Interventional Cardiology

## 2021-11-16 DIAGNOSIS — M810 Age-related osteoporosis without current pathological fracture: Secondary | ICD-10-CM | POA: Diagnosis not present

## 2021-11-16 DIAGNOSIS — I6523 Occlusion and stenosis of bilateral carotid arteries: Secondary | ICD-10-CM | POA: Insufficient documentation

## 2021-11-19 NOTE — Addendum Note (Signed)
Addended by: Molli Barrows on: 11/19/2021 02:56 PM   Modules accepted: Orders

## 2021-11-24 ENCOUNTER — Telehealth: Payer: Self-pay | Admitting: Family Medicine

## 2021-11-24 DIAGNOSIS — R260 Ataxic gait: Secondary | ICD-10-CM | POA: Diagnosis not present

## 2021-11-24 DIAGNOSIS — R42 Dizziness and giddiness: Secondary | ICD-10-CM | POA: Diagnosis not present

## 2021-11-24 DIAGNOSIS — H832X9 Labyrinthine dysfunction, unspecified ear: Secondary | ICD-10-CM | POA: Diagnosis not present

## 2021-11-24 NOTE — Telephone Encounter (Signed)
Patient called stating that she was recently seen at [facility] and it was suggested that she see a neck therapist. She wanted to know who Dr Tamala Julian would recommend.  Please advise.

## 2021-11-26 ENCOUNTER — Ambulatory Visit: Payer: Medicare Other | Attending: Interventional Cardiology

## 2021-11-26 DIAGNOSIS — E7849 Other hyperlipidemia: Secondary | ICD-10-CM | POA: Diagnosis not present

## 2021-11-26 LAB — HEPATIC FUNCTION PANEL
ALT: 16 IU/L (ref 0–32)
AST: 24 IU/L (ref 0–40)
Albumin: 4.8 g/dL (ref 3.8–4.8)
Alkaline Phosphatase: 67 IU/L (ref 44–121)
Bilirubin Total: 0.4 mg/dL (ref 0.0–1.2)
Bilirubin, Direct: 0.15 mg/dL (ref 0.00–0.40)
Total Protein: 7.4 g/dL (ref 6.0–8.5)

## 2021-11-26 LAB — LIPID PANEL
Chol/HDL Ratio: 1.9 ratio (ref 0.0–4.4)
Cholesterol, Total: 190 mg/dL (ref 100–199)
HDL: 102 mg/dL (ref >39)
LDL Chol Calc (NIH): 72 mg/dL (ref 0–99)
Triglycerides: 93 mg/dL (ref 0–149)
VLDL Cholesterol Cal: 16 mg/dL (ref 5–40)

## 2021-11-30 DIAGNOSIS — F5102 Adjustment insomnia: Secondary | ICD-10-CM | POA: Diagnosis not present

## 2021-11-30 DIAGNOSIS — H8190 Unspecified disorder of vestibular function, unspecified ear: Secondary | ICD-10-CM | POA: Diagnosis not present

## 2021-11-30 DIAGNOSIS — I10 Essential (primary) hypertension: Secondary | ICD-10-CM | POA: Diagnosis not present

## 2021-12-02 ENCOUNTER — Other Ambulatory Visit: Payer: Self-pay

## 2021-12-02 DIAGNOSIS — R2689 Other abnormalities of gait and mobility: Secondary | ICD-10-CM

## 2021-12-02 DIAGNOSIS — R279 Unspecified lack of coordination: Secondary | ICD-10-CM

## 2021-12-02 NOTE — Telephone Encounter (Signed)
Referral placed and patient notified.  

## 2021-12-02 NOTE — Telephone Encounter (Signed)
Patient called back following up. She asked if he had a recommendation or if he would want to see her?

## 2021-12-06 ENCOUNTER — Telehealth: Payer: Self-pay | Admitting: Pharmacist

## 2021-12-06 NOTE — Telephone Encounter (Signed)
Patient called to let us know that in 2024 her [place] will cover [place] and not Praluent. Will switch pt over to [place] after 01/24/22.

## 2021-12-10 NOTE — Therapy (Signed)
OUTPATIENT PHYSICAL THERAPY VESTIBULAR EVALUATION     Patient Name: Amanda Cannon MRN: [id number] DOB:03/22/1945, 76 y.o., female Today's Date: 12/13/2021  END OF SESSION:  PT End of Session - 12/13/21 1239     Visit Number 1    Number of Visits 17    Date for PT Re-Evaluation 02/07/22    Authorization Type Medicare/AARP    PT Start Time 0842    PT Stop Time 0933    PT Time Calculation (min) 51 min    Equipment Utilized During Treatment Gait belt    Activity Tolerance Patient tolerated treatment well    Behavior During Therapy WFL for tasks assessed/performed             Past Medical History:  Diagnosis Date   Alcohol use    Anemia    Anxiety    Asthma    Basal cell carcinoma    forehead   Breast cancer ([place]) 1998   left    Colon polyps    GERD (gastroesophageal reflux disease)    Glaucoma    Hyperlipidemia    Hypertension    Hyponatremia    remote, mild   Mitral valve prolapse    Osteoarthritis    sees rheumatologist   Osteoporosis    sees rheumatologist   Personal history of radiation therapy 1998   PMR (polymyalgia rheumatica) ([place])    sees rheumatologist   Pneumonia    Prinzmetal angina ([place])    White matter changes    ? ischemic sm vs per pt   Past Surgical History:  Procedure Laterality Date   ABDOMINAL HYSTERECTOMY     BREAST LUMPECTOMY Left 1998   cancer   CESAREAN SECTION     x 4   COLONOSCOPY  07/2012   in [place]  2005   abdominal hernia repair   HYSTEROTOMY     LYMPH NODE DISSECTION Left    POLYPECTOMY     Patient Active Problem List   Diagnosis Date Noted   DDD (degenerative disc disease), cervical 06/22/2021   History of tamoxifen therapy 05/13/2020   Increased risk of breast cancer 05/13/2020   Degenerative cervical disc 01/01/2020   Vertigo 08/06/2019   Cough 08/06/2019   Mixed rhinitis 08/06/2019   Gastroesophageal reflux disease 08/06/2019   Vestibular disorder 04/09/2019   Bilateral hearing loss  03/23/2018   ETD (Eustachian tube dysfunction), bilateral 03/23/2018   Facial numbness 03/23/2018   Age-related osteoporosis without current pathological fracture 01/15/2018   Breast pain, left 01/15/2018   Malignant neoplasm of lower-outer quadrant of left breast of female, estrogen receptor positive ([place]) 01/15/2018   Trigeminal neuropathy 11/13/2017   Pain in right hand 05/25/2017   PMR (polymyalgia rheumatica) ([place]) 04/24/2017   Hyponatremia 07/15/2015   Hyperlipidemia 07/15/2015   Esophageal spasm 07/15/2015   Essential hypertension 07/15/2015   Depression 07/15/2015   Asthma 07/15/2015   Tinnitus 07/15/2015   Osteoporosis 07/15/2015   Spasm    Prinzmetal angina ([place])    Mitral valve prolapse    White matter changes    Osteoarthritis 01/24/2013    PCP: [name], MD  REFERRING PROVIDER: Lyndal Pulley, DO  REFERRING DIAG: R26.89 (ICD-10-CM) - Balance disorder R27.9 (ICD-10-CM) - Coordination disorder  THERAPY DIAG:  Dizziness and giddiness  Unsteadiness on feet  Cervicalgia  ONSET DATE: 5 years ago  Rationale for Evaluation and Treatment: Rehabilitation  SUBJECTIVE:   SUBJECTIVE STATEMENT: Patient reports that she went to [facility] and was  diagnosed with atypical vestibular migraines. Was given a medication which she discontinued because she believes it was not good for her. Had a PT evaluation and reports that neck traction improved symptoms. Symptoms first started 5 years ago with N/T over L side of face and eyelid which has spread to the R side, occasional jabs in the head, L ear sounds like she is under water, trouble rotating head to L. Denies head trauma, infection/illness, otalgia. Reports occasional double vision, chronic tinnitus, occasional photo/phonophobia, head heaviness.  Reports hoarseness. Reports dizziness when she turns her head. Denies dizziness with bed mobility. Dizziness lasts seconds and describes episodes as imbalance and spinning.  Reports difficulty maintaining balance with EC in the shower.    Pt accompanied by: self and significant other  PERTINENT HISTORY: Anemia, anxiety, asthma, L breast CA s/p lumpectomy, GERD, HLD, HTN, osteoporosis, polymyalgia rheumatica  PAIN:  Are you having pain? Yes: NPRS scale: 6-7/10 Pain location: L head Pain description: numbness, heaviness Aggravating factors: constant Relieving factors: nothing  PRECAUTIONS: Fall and Other: osteoporosis   WEIGHT BEARING RESTRICTIONS: No  FALLS: Has patient fallen in last 6 months? 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  PLOF: Independent  PATIENT GOALS: improved balance  OBJECTIVE:   DIAGNOSTIC FINDINGS: none recent  COGNITION: Overall cognitive status: Within functional limits for tasks assessed   SENSATION: Intact with light pressure over body and face  POSTURE:  rounded shoulders and forward head   LOWER EXTREMITY MMT:   MMT Right eval Left eval  Hip flexion    Hip abduction    Hip adduction    Hip internal rotation    Hip external rotation    Knee flexion    Knee extension    Ankle dorsiflexion    Ankle plantarflexion    Ankle inversion    Ankle eversion    (Blank rows = not tested)  GAIT: Gait pattern:  short step length, slow, guarded gait Assistive device utilized: None Level of assistance: SBA  FUNCTIONAL TESTS:    M-CTSIB  Condition 1: Firm Surface, EO 30 Sec, Moderate Sway  Condition 2: Firm Surface, EC 3 Sec, Severe and LOB posteriorly  Sway  Condition 3: Foam Surface, EO NT d/t safety Sec,  -  Sway  Condition 4: Foam Surface, EC NT d/t safety Sec,  -  Sway     PATIENT SURVEYS:  FOTO 48  VESTIBULAR ASSESSMENT:  GENERAL OBSERVATION: pt wears readers     OCULOMOTOR EXAM:  Ocular Alignment: normal  Ocular ROM: No Limitations  Spontaneous  Nystagmus: absent  Gaze-Induced Nystagmus: absent  Smooth Pursuits: intact and pt reports sensation of "cross eyed"  Saccades: intact and c/o some wooziness  Convergence/Divergence: "trouble seeing" at ~ 1 foot away; c/o dizziness    VESTIBULAR - OCULAR REFLEX:   Slow VOR: Comment: c/o dizziness, diplopia, "not balanced" ; head nods/turns guarded and discontinuous  VOR Cancellation: Comment: no sx; trouble coordinating movement   Head-Impulse Test: HIT Right: slight positive  HIT Left: negative      POSITIONAL TESTING:  Right Roll Test: negative; c/o mild dizziness with movement, improved with holding position Left Roll Test: c/o mild dizziness with movement, improved with holding position Right Sidelying: negative; c/o very mild dizziness upon sitting up  Left Sidelying: negative; c/o very mild dizziness upon sitting up     VESTIBULAR TREATMENT:  DATE: 12/13/21   PATIENT EDUCATION: Education details: prognosis, POC, HEP, exam findings  Person educated: Patient and Spouse Education method: Explanation, Demonstration, Tactile cues, Verbal cues, and Handouts Education comprehension: verbalized understanding and returned demonstration  HOME EXERCISE PROGRAM: Access Code: [email or web address] URL: [email or web address] Date: 12/13/2021 Prepared by: [facility]  Exercises - Seated Left Head Turns Vestibular Habituation  - 1 x daily - 5 x weekly - 2-3 sets - 30 sec hold - Seated Head Nods Vestibular Habituation  - 1 x daily - 5 x weekly - 2-3 sets - 30 sec hold - Narrow Stance with Counter Support  - 1 x daily - 5 x weekly - 2-3 sets - 30 sec hold   GOALS: Goals reviewed with patient? Yes  SHORT TERM GOALS: Target date: 01/03/2022  Patient to be independent with initial HEP. Baseline: HEP initiated Goal status: INITIAL    LONG TERM  GOALS: Target date: 02/07/2022  Patient to be independent with advanced HEP. Baseline: Not yet initiated  Goal status: INITIAL  Patient to report 0/10 dizziness with standing vertical and horizontal VOR for 30 seconds. Baseline: Unable Goal status: INITIAL  Patient will report 0/10 dizziness with bed mobility.  Baseline: Symptomatic  Goal status: INITIAL  Patient to demonstrate mild sway with M-CTSIB condition with eyes closed/FIRM surface for 30 sec in order to improve safety in environments with uneven surfaces and dim lighting. Baseline: 3 sec before LOB Goal status: INITIAL  Patient to score at least 20/24 on DGI in order to decrease risk of falls. Baseline: NT Goal status: INITIAL  Patient to demonstrate cervical AROM WFL and without pain limiting in order to improve ability to scan environment. Baseline: Unable Goal status: INITIAL  Patient to score at least 55 on FOTO in order to indicate improved functional outcomes.  Baseline: 48 Goal status: INITIAL    ASSESSMENT:  CLINICAL IMPRESSION:   Patient is a 76 y/o F presenting to OPPT with husband with c/o dizziness for the past 5 years. Reports diagnosis of "atypical vestibular migraines" and was seen for evaluation by Dr. Lynelle Smoke on 11/24/21 which revealed "no clear evidence of BPPV, vestibular hypofunction, or cervicogenic dizziness" but significant balance impairments. Recommendation was to continued with vestibular rehab at a location closer to home. Patient today denies head trauma, infection/illness, otalgia. Reports occasional double vision, chronic tinnitus, occasional photo/phonophobia, head heaviness, hoarseness. Reports dizziness when she turns her head and difficulty maintaining balance with EC in the shower. Oculomotor exam revealed dizziness with smooth pursuits, saccades, VOR, convergence insufficiency, and positive R HIT. Positional testing was negative however patent with some motion sensitivity with  bed mobility. Also presented with rounded posture, gait deviations, limited use of vestibular system for balance. Patient was educated on gentle balance and VOR HEP and reported understanding. Would benefit from skilled PT services 2x/week for 8 weeks to address aforementioned impairments in order to optimize level of function.    OBJECTIVE IMPAIRMENTS: Abnormal gait, decreased balance, decreased ROM, dizziness, impaired flexibility, improper body mechanics, postural dysfunction, and pain.   ACTIVITY LIMITATIONS: carrying, lifting, bending, sitting, standing, squatting, sleeping, stairs, transfers, bed mobility, bathing, dressing, reach over head, and caring for others  PARTICIPATION LIMITATIONS: meal prep, cleaning, laundry, shopping, community activity, and church  PERSONAL FACTORS: Age, Fitness, Past/current experiences, Time since onset of injury/illness/exacerbation, and 3+ comorbidities: Anemia, anxiety, asthma, L breast CA s/p lumpectomy, GERD, HLD, HTN, osteoporosis, polymyalgia rheumatica  are also affecting patient's functional outcome.  REHAB POTENTIAL: Good  CLINICAL DECISION MAKING: Evolving/moderate complexity  EVALUATION COMPLEXITY: Moderate   PLAN:  PT FREQUENCY: 2x/week  PT DURATION: 8 weeks  PLANNED INTERVENTIONS: Therapeutic exercises, Therapeutic activity, Neuromuscular re-education, Balance training, Gait training, Patient/Family education, Self Care, Joint mobilization, Stair training, Vestibular training, Canalith repositioning, DME instructions, Aquatic Therapy, Dry Needling, Electrical stimulation, Cryotherapy, Moist heat, Taping, Manual therapy, and Re-evaluation  PLAN FOR NEXT SESSION: assess cervical AROM and palpation, DGI, reassess HEP, progress VOR and habituation    Janene Harvey, PT, DPT 12/13/21 12:59 PM  [facility] [address], [place], [place] [address] Phone # [phone] Fax #  [phone]

## 2021-12-13 ENCOUNTER — Other Ambulatory Visit: Payer: Self-pay

## 2021-12-13 ENCOUNTER — Ambulatory Visit: Payer: Medicare Other | Attending: Family Medicine | Admitting: Physical Therapy

## 2021-12-13 ENCOUNTER — Encounter: Payer: Self-pay | Admitting: Physical Therapy

## 2021-12-13 DIAGNOSIS — R2689 Other abnormalities of gait and mobility: Secondary | ICD-10-CM | POA: Insufficient documentation

## 2021-12-13 DIAGNOSIS — R2681 Unsteadiness on feet: Secondary | ICD-10-CM | POA: Diagnosis not present

## 2021-12-13 DIAGNOSIS — M542 Cervicalgia: Secondary | ICD-10-CM | POA: Insufficient documentation

## 2021-12-13 DIAGNOSIS — R279 Unspecified lack of coordination: Secondary | ICD-10-CM | POA: Diagnosis not present

## 2021-12-13 DIAGNOSIS — R42 Dizziness and giddiness: Secondary | ICD-10-CM | POA: Diagnosis not present

## 2021-12-20 ENCOUNTER — Telehealth: Payer: Self-pay | Admitting: Pharmacist

## 2021-12-20 NOTE — Telephone Encounter (Signed)
Key: [id number] PA for Pral[employer] submitted. Will need to be swtiched to [facility] in Jan.

## 2021-12-20 NOTE — Therapy (Signed)
OUTPATIENT PHYSICAL THERAPY VESTIBULAR TREATMENT     Patient Name: Amanda Cannon MRN: [id number] DOB:[birth year], 76 y.o., female Today's Date: 12/21/2021  END OF SESSION:  PT End of Session - 12/21/21 1704     Visit Number 2    Number of Visits 17    Date for PT Re-Evaluation 02/07/22    Authorization Type Medicare/[employer]ARP    PT Start Time 1620    PT Stop Time 1700    PT Time Calculation (min) 40 min    Equipment Utilized During Treatment Gait belt    Activity Tolerance Patient tolerated treatment well    Behavior During Therapy WFL for tasks assessed/performed              Past Medical History:  Diagnosis Date   Alcohol use    Anemia    Anxiety    Asthma    Basal cell carcinoma    forehead   Breast cancer ([place]) 1998   left    Colon polyps    GERD (gastroesophageal reflux disease)    Glaucoma    Hyperlipidemia    Hypertension    Hyponatremia    remote, mild   Mitral valve prolapse    Osteoarthritis    sees rheumatologist   Osteoporosis    sees rheumatologist   Personal history of radiation therapy 1998   PMR (polymyalgia rheumatica) ([place])    sees rheumatologist   Pneumonia    Prinzmetal angina ([place])    White matter changes    ? ischemic sm vs per pt   Past Surgical History:  Procedure Laterality Date   ABDOMINAL HYSTERECTOMY     BREAST LUMPECTOMY Left 1998   cancer   CESAREAN SECTION     x 4   COLONOSCOPY  07/2012   in [place]  2005   abdominal hernia repair   HYSTEROTOMY     LYMPH NODE DISSECTION Left    POLYPECTOMY     Patient Active Problem List   Diagnosis Date Noted   DDD (degenerative disc disease), cervical 06/22/2021   History of tamoxifen therapy 05/13/2020   Increased risk of breast cancer 05/13/2020   Degenerative cervical disc 01/01/2020   Vertigo 08/06/2019   Cough 08/06/2019   Mixed rhinitis 08/06/2019   Gastroesophageal reflux disease 08/06/2019   Vestibular disorder 04/09/2019   Bilateral hearing loss  03/23/2018   ETD (Eustachian tube dysfunction), bilateral 03/23/2018   Facial numbness 03/23/2018   Age-related osteoporosis without current pathological fracture 01/15/2018   Breast pain, left 01/15/2018   Malignant neoplasm of lower-outer quadrant of left breast of female, estrogen receptor positive ([place]) 01/15/2018   Trigeminal neuropathy 11/13/2017   Pain in right hand 05/25/2017   PMR (polymyalgia rheumatica) ([place]) 04/24/2017   Hyponatremia 07/15/2015   Hyperlipidemia 07/15/2015   Esophageal spasm 07/15/2015   Essential hypertension 07/15/2015   Depression 07/15/2015   Asthma 07/15/2015   Tinnitus 07/15/2015   Osteoporosis 07/15/2015   Spasm    Prinzmetal angina ([place])    Mitral valve prolapse    White matter changes    Osteoarthritis 01/24/2013    PCP: [name], MD  REFERRING PROVIDER: Lyndal Pulley, DO  REFERRING DIAG: R26.89 (ICD-10-CM) - Balance disorder R27.9 (ICD-10-CM) - Coordination disorder  THERAPY DIAG:  Dizziness and giddiness  Unsteadiness on feet  Cervicalgia  ONSET DATE: 5 years ago  Rationale for Evaluation and Treatment: Rehabilitation  SUBJECTIVE:   SUBJECTIVE STATEMENT: Noticing that when she drinks water or soda  or when playing seated volleyball her L side of the face gets numb. Had volleyball today and has this sensation currently.  Admits to not doing her HEP d/t being busy with work.    Pt accompanied by: self and significant other  PERTINENT HISTORY: Anemia, anxiety, asthma, L breast CA s/p lumpectomy, GERD, HLD, HTN, osteoporosis, polymyalgia rheumatica  PAIN:  Are you having pain? Yes: NPRS scale: 0/10 Pain location: L head Pain description: numbness, heaviness Aggravating factors: constant Relieving factors: nothing  PRECAUTIONS: Fall and Other: osteoporosis   WEIGHT BEARING RESTRICTIONS: No  FALLS: Has patient fallen in last 6 months? 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  PLOF: Independent  PATIENT GOALS: improved balance  OBJECTIVE:     TODAY'S TREATMENT: 12/21/21  CERVICAL ROM:   Active ROM A/PROM (deg) eval  Flexion 25  Extension 18  Right lateral flexion 25  Left lateral flexion 21  Right rotation 37  Left rotation 51   (Blank rows = not tested)  *no increased dizziness or other symptoms with neck movements    Activity Comments  Palpation  Very tight and tender in B UT, L scalenes and SCM attachment  DGI 13/24   Sitting head turns to targets 2x30" C/o feeling cross eyed  Sitting head nods to targets  2x30" Report of less intense than head turns; c/o "sensation" in th R side of head   Romberg 3x30"  Tendency for retropulsion, addressed with cues to "lean shoulders forward"        [facility] PT Assessment - 12/21/21 0001       Standardized Balance Assessment   Standardized Balance Assessment Dynamic Gait Index      Dynamic Gait Index   Level Surface Moderate Impairment    Change in Gait Speed Mild Impairment    Gait with Horizontal Head Turns Moderate Impairment    Gait with Vertical Head Turns Moderate Impairment    Gait and Pivot Turn Moderate Impairment    Step Over Obstacle Normal    Step Around Obstacles Mild Impairment    Steps Mild Impairment    Total Score 13    DGI comment: c/o dizziness after gait with turns/nods,              HOME EXERCISE PROGRAM Last updated: 12/21/21 Access Code: [email or web address] URL: [email or web address] Date: 12/21/2021 Prepared by: [facility]  Exercises - Seated Left Head Turns Vestibular Habituation  - 1 x daily - 5 x weekly - 2-3 sets - 30 sec hold - Seated Head Nods Vestibular Habituation  - 1 x daily - 5 x weekly - 2-3 sets - 30 sec hold - Narrow Stance with Counter Support  - 1 x daily - 5 x weekly - 2-3 sets - 30 sec  hold    PATIENT EDUCATION: Education details: edu on importance of HEP compliance and reasoning behind specific exercises; provided extra copy of HEP Person educated: Patient Education method: Explanation, Demonstration, Tactile cues, Verbal cues, and Handouts Education comprehension: verbalized understanding and returned demonstration    Below measures were taken at time of initial evaluation unless otherwise specified:   DIAGNOSTIC FINDINGS: none recent  COGNITION: Overall cognitive status: Within functional limits for tasks assessed   SENSATION: Intact with light pressure over body and face  POSTURE:  rounded shoulders and forward head  LOWER EXTREMITY MMT:   MMT Right eval Left eval  Hip flexion    Hip abduction    Hip adduction    Hip internal rotation    Hip external rotation    Knee flexion    Knee extension    Ankle dorsiflexion    Ankle plantarflexion    Ankle inversion    Ankle eversion    (Blank rows = not tested)  GAIT: Gait pattern:  short step length, slow, guarded gait Assistive device utilized: None Level of assistance: SBA  FUNCTIONAL TESTS:    M-CTSIB  Condition 1: Firm Surface, EO 30 Sec, Moderate Sway  Condition 2: Firm Surface, EC 3 Sec, Severe and LOB posteriorly  Sway  Condition 3: Foam Surface, EO NT d/t safety Sec,  -  Sway  Condition 4: Foam Surface, EC NT d/t safety Sec,  -  Sway     PATIENT SURVEYS:  FOTO 48  VESTIBULAR ASSESSMENT:  GENERAL OBSERVATION: pt wears readers     OCULOMOTOR EXAM:  Ocular Alignment: normal  Ocular ROM: No Limitations  Spontaneous Nystagmus: absent  Gaze-Induced Nystagmus: absent  Smooth Pursuits: intact and pt reports sensation of "cross eyed"  Saccades: intact and c/o some wooziness  Convergence/Divergence: "trouble seeing" at ~ 1 foot away; c/o dizziness    VESTIBULAR - OCULAR REFLEX:   Slow VOR: Comment: c/o dizziness, diplopia, "not balanced" ; head nods/turns guarded and  discontinuous  VOR Cancellation: Comment: no sx; trouble coordinating movement   Head-Impulse Test: HIT Right: slight positive  HIT Left: negative      POSITIONAL TESTING:  Right Roll Test: negative; c/o mild dizziness with movement, improved with holding position Left Roll Test: c/o mild dizziness with movement, improved with holding position Right Sidelying: negative; c/o very mild dizziness upon sitting up  Left Sidelying: negative; c/o very mild dizziness upon sitting up     VESTIBULAR TREATMENT:                                                                                                   DATE: 12/13/21   PATIENT EDUCATION: Education details: prognosis, POC, HEP, exam findings  Person educated: Patient and Spouse Education method: Explanation, Demonstration, Tactile cues, Verbal cues, and Handouts Education comprehension: verbalized understanding and returned demonstration  HOME EXERCISE PROGRAM: Access Code: [email or web address] URL: [email or web address] Date: 12/13/2021 Prepared by: [facility]  Exercises - Seated Left Head Turns Vestibular Habituation  - 1 x daily - 5 x weekly - 2-3 sets - 30 sec hold - Seated Head Nods Vestibular Habituation  - 1 x daily - 5 x weekly - 2-3 sets - 30 sec hold - Narrow Stance with Counter Support  - 1 x daily - 5 x weekly - 2-3 sets - 30 sec hold   GOALS: Goals reviewed with patient? Yes  SHORT TERM GOALS: Target date: 01/03/2022  Patient to be independent with initial HEP. Baseline: HEP initiated Goal status: IN PROGRESS    LONG TERM GOALS: Target date: 02/07/2022  Patient to be independent with advanced HEP. Baseline: Not  yet initiated  Goal status: IN PROGRESS  Patient to report 0/10 dizziness with standing vertical and horizontal VOR for 30 seconds. Baseline: Unable Goal status: IN PROGRESS  Patient will report 0/10 dizziness with bed mobility.  Baseline: Symptomatic  Goal  status: IN PROGRESS  Patient to demonstrate mild sway with M-CTSIB condition with eyes closed/FIRM surface for 30 sec in order to improve safety in environments with uneven surfaces and dim lighting. Baseline: 3 sec before LOB Goal status: IN PROGRESS  Patient to score at least 20/24 on DGI in order to decrease risk of falls. Baseline: 13 Goal status: IN PROGRESS  Patient to demonstrate cervical AROM WFL and without pain limiting in order to improve ability to scan environment. Baseline:  Active ROM A/PROM (deg) eval  Flexion 25  Extension 18  Right lateral flexion 25  Left lateral flexion 21  Right rotation 37  Left rotation 51   (Blank rows = not tested) Goal status: IN PROGRESS  Patient to score at least 55 on FOTO in order to indicate improved functional outcomes.  Baseline: 48 Goal status: IN PROGRESS    ASSESSMENT:  CLINICAL IMPRESSION: Patient arrived to session with report of increased sensation of facial N/T when drinking and playing seated volleyball. Cervical ROM appeared limited but nonpainful and without c/o dizziness or other increase in symptoms. Significant increase in soft tissue restriction evident with palpation over cervical musculature, L>R. Patient scored 13/24 on DGI, indicating increased risk of falls. Most difficulty evident with gait with head turns/nods and turns. Reviewed HEP for max carryover and understanding. Patient required education on purpose of these exercises as she admits to HEP noncompliance and reports that she was given VOR activities in the past. No complaints at end of session.    OBJECTIVE IMPAIRMENTS: Abnormal gait, decreased balance, decreased ROM, dizziness, impaired flexibility, improper body mechanics, postural dysfunction, and pain.   ACTIVITY LIMITATIONS: carrying, lifting, bending, sitting, standing, squatting, sleeping, stairs, transfers, bed mobility, bathing, dressing, reach over head, and caring for others  PARTICIPATION  LIMITATIONS: meal prep, cleaning, laundry, shopping, community activity, and church  PERSONAL FACTORS: Age, Fitness, Past/current experiences, Time since onset of injury/illness/exacerbation, and 3+ comorbidities: Anemia, anxiety, asthma, L breast CA s/p lumpectomy, GERD, HLD, HTN, osteoporosis, polymyalgia rheumatica  are also affecting patient's functional outcome.   REHAB POTENTIAL: Good  CLINICAL DECISION MAKING: Evolving/moderate complexity  EVALUATION COMPLEXITY: Moderate   PLAN:  PT FREQUENCY: 2x/week  PT DURATION: 8 weeks  PLANNED INTERVENTIONS: Therapeutic exercises, Therapeutic activity, Neuromuscular re-education, Balance training, Gait training, Patient/Family education, Self Care, Joint mobilization, Stair training, Vestibular training, Canalith repositioning, DME instructions, Aquatic Therapy, Dry Needling, Electrical stimulation, Cryotherapy, Moist heat, Taping, Manual therapy, and Re-evaluation  PLAN FOR NEXT SESSION: progress HEP with cervical ROM, strengthening, stretching, reassess HEP, progress VOR and habituation    Janene Harvey, PT, DPT 12/21/21 5:05 PM  [facility] [address] [place], [place] [address] Phone # [phone] Fax # [phone]

## 2021-12-20 NOTE — Telephone Encounter (Signed)
PA approved. Her plan will cover [place] next year, so will need to change over after Jan 1

## 2021-12-21 ENCOUNTER — Ambulatory Visit: Payer: Medicare Other | Admitting: Physical Therapy

## 2021-12-21 ENCOUNTER — Encounter: Payer: Self-pay | Admitting: Physical Therapy

## 2021-12-21 ENCOUNTER — Other Ambulatory Visit: Payer: Self-pay | Admitting: Pharmacist

## 2021-12-21 DIAGNOSIS — R2681 Unsteadiness on feet: Secondary | ICD-10-CM

## 2021-12-21 DIAGNOSIS — R42 Dizziness and giddiness: Secondary | ICD-10-CM

## 2021-12-21 DIAGNOSIS — M542 Cervicalgia: Secondary | ICD-10-CM | POA: Diagnosis not present

## 2021-12-21 DIAGNOSIS — R2689 Other abnormalities of gait and mobility: Secondary | ICD-10-CM | POA: Diagnosis not present

## 2021-12-21 DIAGNOSIS — R279 Unspecified lack of coordination: Secondary | ICD-10-CM | POA: Diagnosis not present

## 2021-12-21 MED ORDER — PRALUENT 150 MG/ML ~~LOC~~ SOAJ: 1.0000 "pen " | SUBCUTANEOUS | 0 refills | Status: DC

## 2021-12-23 ENCOUNTER — Ambulatory Visit: Payer: Medicare Other | Admitting: Physical Therapy

## 2021-12-23 NOTE — Therapy (Signed)
OUTPATIENT PHYSICAL THERAPY VESTIBULAR TREATMENT     Patient Name: Amanda Cannon MRN: [id number] DOB:[birth year], 76 y.o., female Today's Date: 12/24/2021  END OF SESSION:  PT End of Session - 12/24/21 0839     Visit Number 3    Number of Visits 17    Date for PT Re-Evaluation 02/07/22    Authorization Type Medicare/AARP    PT Start Time 0757    PT Stop Time 0840    PT Time Calculation (min) 43 min    Equipment Utilized During Treatment Gait belt    Activity Tolerance Patient tolerated treatment well    Behavior During Therapy WFL for tasks assessed/performed               Past Medical History:  Diagnosis Date   Alcohol use    Anemia    Anxiety    Asthma    Basal cell carcinoma    forehead   Breast cancer ([place]) 1998   left    Colon polyps    GERD (gastroesophageal reflux disease)    Glaucoma    Hyperlipidemia    Hypertension    Hyponatremia    remote, mild   Mitral valve prolapse    Osteoarthritis    sees rheumatologist   Osteoporosis    sees rheumatologist   Personal history of radiation therapy 1998   PMR (polymyalgia rheumatica) ([place])    sees rheumatologist   Pneumonia    Prinzmetal angina ([place])    White matter changes    ? ischemic sm vs per pt   Past Surgical History:  Procedure Laterality Date   ABDOMINAL HYSTERECTOMY     BREAST LUMPECTOMY Left 1998   cancer   CESAREAN SECTION     x 4   COLONOSCOPY  07/2012   in [place]  2005   abdominal hernia repair   HYSTEROTOMY     LYMPH NODE DISSECTION Left    POLYPECTOMY     Patient Active Problem List   Diagnosis Date Noted   DDD (degenerative disc disease), cervical 06/22/2021   History of tamoxifen therapy 05/13/2020   Increased risk of breast cancer 05/13/2020   Degenerative cervical disc 01/01/2020   Vertigo 08/06/2019   Cough 08/06/2019   Mixed rhinitis 08/06/2019   Gastroesophageal reflux disease 08/06/2019   Vestibular disorder 04/09/2019   Bilateral hearing loss  03/23/2018   ETD (Eustachian tube dysfunction), bilateral 03/23/2018   Facial numbness 03/23/2018   Age-related osteoporosis without current pathological fracture 01/15/2018   Breast pain, left 01/15/2018   Malignant neoplasm of lower-outer quadrant of left breast of female, estrogen receptor positive ([place]) 01/15/2018   Trigeminal neuropathy 11/13/2017   Pain in right hand 05/25/2017   PMR (polymyalgia rheumatica) ([place]) 04/24/2017   Hyponatremia 07/15/2015   Hyperlipidemia 07/15/2015   Esophageal spasm 07/15/2015   Essential hypertension 07/15/2015   Depression 07/15/2015   Asthma 07/15/2015   Tinnitus 07/15/2015   Osteoporosis 07/15/2015   Spasm    Prinzmetal angina ([place])    Mitral valve prolapse    White matter changes    Osteoarthritis 01/24/2013    PCP: [name], MD  REFERRING PROVIDER: Lyndal Pulley, DO  REFERRING DIAG: R26.89 (ICD-10-CM) - Balance disorder R27.9 (ICD-10-CM) - Coordination disorder  THERAPY DIAG:  Dizziness and giddiness  Unsteadiness on feet  Cervicalgia  ONSET DATE: 5 years ago  Rationale for Evaluation and Treatment: Rehabilitation  SUBJECTIVE:   SUBJECTIVE STATEMENT: Has been up since 4am. Doing okay.  Did her exercises. No pain, L eye feels blurry.    Pt accompanied by: self and significant other  PERTINENT HISTORY: Anemia, anxiety, asthma, L breast CA s/p lumpectomy, GERD, HLD, HTN, osteoporosis, polymyalgia rheumatica  PAIN:  Are you having pain? Yes: NPRS scale: 0/10 Pain location: L head Pain description: numbness, heaviness Aggravating factors: constant Relieving factors: nothing  PRECAUTIONS: Fall and Other: osteoporosis   WEIGHT BEARING RESTRICTIONS: No  FALLS: Has patient fallen in last 6 months? 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  PLOF:  Independent  PATIENT GOALS: improved balance  OBJECTIVE:     TODAY'S TREATMENT: 12/24/21 Activity Comments  cervical retraction 10x 3" 2 fingers at chin to guide motion; good ROM  cervical rotation SNAG 10x each  "That feels so good" required verbal and manual cueing for proper hand placement and alignment; to tolerance  cervical extension SNAG 10x To tolerance; c/o some dizziness   brandt daroff EO 3x each side  3/10 dizziness upon sitting up; PT assist sitting up and to increase speed  rolling EO/EC 30" CGA; mild sway EO, moderate sway EC  Standing EC + head turns/nods 2x30"  Cueing to decrease pace to improve stability; cueing to push down through toes to correct retropulsion; CGA-min A             HOME EXERCISE PROGRAM Last updated: 12/24/21 Access Code: [email or web address] N URL: [email or web address] Date: 12/24/2021 Prepared by: [facility]  Exercises - Seated Left Head Turns Vestibular Habituation  - 1 x daily - 5 x weekly - 2-3 sets - 30 sec hold - Seated Head Nods Vestibular Habituation  - 1 x daily - 5 x weekly - 2-3 sets - 30 sec hold - Narrow Stance with Counter Support  - 1 x daily - 5 x weekly - 2-3 sets - 30 sec hold - Mid-Lower Cervical Extension SNAG with Strap  - 1 x daily - 5 x weekly - 2 sets - 10 reps - Seated Assisted Cervical Rotation with Towel  - 1 x daily - 5 x weekly - 2 sets - 10 reps - Cervical Retraction with Overpressure  - 1 x daily - 5 x weekly - 2 sets - 10 reps - 3 sec hold    PATIENT EDUCATION: Education details: HEP update Person educated: Patient Education method: Explanation, Demonstration, Tactile cues, Verbal cues, and Handouts Education comprehension: verbalized understanding and returned demonstration     Below measures were taken at time of initial evaluation unless otherwise specified:   DIAGNOSTIC FINDINGS: none recent  COGNITION: Overall cognitive status: Within functional  limits for tasks assessed   SENSATION: Intact with light pressure over body and face  POSTURE:  rounded shoulders and forward head   LOWER EXTREMITY MMT:   MMT Right eval Left eval  Hip flexion    Hip abduction    Hip adduction    Hip internal rotation    Hip external rotation    Knee flexion    Knee extension    Ankle dorsiflexion    Ankle plantarflexion    Ankle inversion    Ankle eversion    (Blank rows = not tested)  GAIT: Gait pattern:  short step length, slow, guarded gait Assistive device utilized: None Level of assistance: SBA  FUNCTIONAL TESTS:    M-CTSIB  Condition 1: Firm Surface,  EO 30 Sec, Moderate Sway  Condition 2: Firm Surface, EC 3 Sec, Severe and LOB posteriorly  Sway  Condition 3: Foam Surface, EO NT d/t safety Sec,  -  Sway  Condition 4: Foam Surface, EC NT d/t safety Sec,  -  Sway     PATIENT SURVEYS:  FOTO 48  VESTIBULAR ASSESSMENT:  GENERAL OBSERVATION: pt wears readers     OCULOMOTOR EXAM:  Ocular Alignment: normal  Ocular ROM: No Limitations  Spontaneous Nystagmus: absent  Gaze-Induced Nystagmus: absent  Smooth Pursuits: intact and pt reports sensation of "cross eyed"  Saccades: intact and c/o some wooziness  Convergence/Divergence: "trouble seeing" at ~ 1 foot away; c/o dizziness    VESTIBULAR - OCULAR REFLEX:   Slow VOR: Comment: c/o dizziness, diplopia, "not balanced" ; head nods/turns guarded and discontinuous  VOR Cancellation: Comment: no sx; trouble coordinating movement   Head-Impulse Test: HIT Right: slight positive  HIT Left: negative      POSITIONAL TESTING:  Right Roll Test: negative; c/o mild dizziness with movement, improved with holding position Left Roll Test: c/o mild dizziness with movement, improved with holding position Right Sidelying: negative; c/o very mild dizziness upon sitting up  Left Sidelying: negative; c/o very mild dizziness upon sitting up     VESTIBULAR TREATMENT:                                                                                                    DATE: 12/13/21   PATIENT EDUCATION: Education details: prognosis, POC, HEP, exam findings  Person educated: Patient and Spouse Education method: Explanation, Demonstration, Tactile cues, Verbal cues, and Handouts Education comprehension: verbalized understanding and returned demonstration  HOME EXERCISE PROGRAM: Access Code: [email or web address] N URL: [email or web address] Date: 12/13/2021 Prepared by: [facility]  Exercises - Seated Left Head Turns Vestibular Habituation  - 1 x daily - 5 x weekly - 2-3 sets - 30 sec hold - Seated Head Nods Vestibular Habituation  - 1 x daily - 5 x weekly - 2-3 sets - 30 sec hold - Narrow Stance with Counter Support  - 1 x daily - 5 x weekly - 2-3 sets - 30 sec hold   GOALS: Goals reviewed with patient? Yes  SHORT TERM GOALS: Target date: 01/03/2022  Patient to be independent with initial HEP. Baseline: HEP initiated Goal status: IN PROGRESS    LONG TERM GOALS: Target date: 02/07/2022  Patient to be independent with advanced HEP. Baseline: Not yet initiated  Goal status: IN PROGRESS  Patient to report 0/10 dizziness with standing vertical and horizontal VOR for 30 seconds. Baseline: Unable Goal status: IN PROGRESS  Patient will report 0/10 dizziness with bed mobility.  Baseline: Symptomatic  Goal status: IN PROGRESS  Patient to demonstrate mild sway with M-CTSIB condition with eyes closed/FIRM surface for 30 sec in order to improve safety in environments with uneven surfaces and dim lighting. Baseline: 3 sec before LOB Goal status: IN PROGRESS  Patient to score at least 20/24 on DGI in order to decrease risk of falls. Baseline: 13 Goal status: IN  PROGRESS  Patient to demonstrate cervical AROM WFL and without pain limiting in order to improve ability to scan environment. Baseline:  Active ROM A/PROM (deg) eval   Flexion 25  Extension 18  Right lateral flexion 25  Left lateral flexion 21  Right rotation 37  Left rotation 51   (Blank rows = not tested) Goal status: IN PROGRESS  Patient to score at least 55 on FOTO in order to indicate improved functional outcomes.  Baseline: 48 Goal status: IN PROGRESS    ASSESSMENT:  CLINICAL IMPRESSION: Patient arrived to session with report of improved HEP compliance since last session. Worked on gentle cervical ROM activities with cueing for proper alignment and positioning. Patient did report of mild dizziness with cervical extension. Patient reported slightly improved N/T and pressure sensation over the L side of the head after cervical activities. Habituation activities were tolerated at slightly increased pace with assist from PT. Multisensory balance activities revealed most significant sway with EC and head nods; cueing required to slow speed to improve stability. Patient reported understanding of HEP update and without complaints at end of session.   OBJECTIVE IMPAIRMENTS: Abnormal gait, decreased balance, decreased ROM, dizziness, impaired flexibility, improper body mechanics, postural dysfunction, and pain.   ACTIVITY LIMITATIONS: carrying, lifting, bending, sitting, standing, squatting, sleeping, stairs, transfers, bed mobility, bathing, dressing, reach over head, and caring for others  PARTICIPATION LIMITATIONS: meal prep, cleaning, laundry, shopping, community activity, and church  PERSONAL FACTORS: Age, Fitness, Past/current experiences, Time since onset of injury/illness/exacerbation, and 3+ comorbidities: Anemia, anxiety, asthma, L breast CA s/p lumpectomy, GERD, HLD, HTN, osteoporosis, polymyalgia rheumatica  are also affecting patient's functional outcome.   REHAB POTENTIAL: Good  CLINICAL DECISION MAKING: Evolving/moderate complexity  EVALUATION COMPLEXITY: Moderate   PLAN:  PT FREQUENCY: 2x/week  PT DURATION: 8 weeks  PLANNED  INTERVENTIONS: Therapeutic exercises, Therapeutic activity, Neuromuscular re-education, Balance training, Gait training, Patient/Family education, Self Care, Joint mobilization, Stair training, Vestibular training, Canalith repositioning, DME instructions, Aquatic Therapy, Dry Needling, Electrical stimulation, Cryotherapy, Moist heat, Taping, Manual therapy, and Re-evaluation  PLAN FOR NEXT SESSION: progress HEP with cervical ROM, strengthening, stretching, reassess HEP, progress VOR and habituation    Janene Harvey, PT, DPT 12/24/21 8:41 AM  [facility] at [facility] [address] [place], [place] [address] Phone # [phone] Fax # [phone]

## 2021-12-24 ENCOUNTER — Encounter: Payer: Self-pay | Admitting: Physical Therapy

## 2021-12-24 ENCOUNTER — Ambulatory Visit: Payer: Medicare Other | Attending: Family Medicine | Admitting: Physical Therapy

## 2021-12-24 DIAGNOSIS — R42 Dizziness and giddiness: Secondary | ICD-10-CM | POA: Insufficient documentation

## 2021-12-24 DIAGNOSIS — R2681 Unsteadiness on feet: Secondary | ICD-10-CM | POA: Diagnosis not present

## 2021-12-24 DIAGNOSIS — M542 Cervicalgia: Secondary | ICD-10-CM | POA: Diagnosis not present

## 2021-12-27 ENCOUNTER — Other Ambulatory Visit: Payer: Self-pay | Admitting: Allergy & Immunology

## 2021-12-27 NOTE — Therapy (Signed)
OUTPATIENT PHYSICAL THERAPY VESTIBULAR TREATMENT     Patient Name: Amanda Cannon MRN: [id number] DOB:1945/01/27, 76 y.o., female Today's Date: 12/28/2021  END OF SESSION:  PT End of Session - 12/28/21 1222     Visit Number 4    Number of Visits 17    Date for PT Re-Evaluation 02/07/22    Authorization Type Medicare/AARP    PT Start Time 1137    PT Stop Time 1221    PT Time Calculation (min) 44 min    Equipment Utilized During Treatment Gait belt    Activity Tolerance Patient tolerated treatment well    Behavior During Therapy WFL for tasks assessed/performed                Past Medical History:  Diagnosis Date   Alcohol use    Anemia    Anxiety    Asthma    Basal cell carcinoma    forehead   Breast cancer ([place]) 1998   left    Colon polyps    GERD (gastroesophageal reflux disease)    Glaucoma    Hyperlipidemia    Hypertension    Hyponatremia    remote, mild   Mitral valve prolapse    Osteoarthritis    sees rheumatologist   Osteoporosis    sees rheumatologist   Personal history of radiation therapy 1998   PMR (polymyalgia rheumatica) ([place])    sees rheumatologist   Pneumonia    Prinzmetal angina ([place])    White matter changes    ? ischemic sm vs per pt   Past Surgical History:  Procedure Laterality Date   ABDOMINAL HYSTERECTOMY     BREAST LUMPECTOMY Left 1998   cancer   CESAREAN SECTION     x 4   COLONOSCOPY  07/2012   in [place]  2005   abdominal hernia repair   HYSTEROTOMY     LYMPH NODE DISSECTION Left    POLYPECTOMY     Patient Active Problem List   Diagnosis Date Noted   DDD (degenerative disc disease), cervical 06/22/2021   History of tamoxifen therapy 05/13/2020   Increased risk of breast cancer 05/13/2020   Degenerative cervical disc 01/01/2020   Vertigo 08/06/2019   Cough 08/06/2019   Mixed rhinitis 08/06/2019   Gastroesophageal reflux disease 08/06/2019   Vestibular disorder 04/09/2019   Bilateral hearing  loss 03/23/2018   ETD (Eustachian tube dysfunction), bilateral 03/23/2018   Facial numbness 03/23/2018   Age-related osteoporosis without current pathological fracture 01/15/2018   Breast pain, left 01/15/2018   Malignant neoplasm of lower-outer quadrant of left breast of female, estrogen receptor positive ([place]) 01/15/2018   Trigeminal neuropathy 11/13/2017   Pain in right hand 05/25/2017   PMR (polymyalgia rheumatica) ([place]) 04/24/2017   Hyponatremia 07/15/2015   Hyperlipidemia 07/15/2015   Esophageal spasm 07/15/2015   Essential hypertension 07/15/2015   Depression 07/15/2015   Asthma 07/15/2015   Tinnitus 07/15/2015   Osteoporosis 07/15/2015   Spasm    Prinzmetal angina ([place])    Mitral valve prolapse    White matter changes    Osteoarthritis 01/24/2013    PCP: [name], MD  REFERRING PROVIDER: Lyndal Pulley, DO  REFERRING DIAG: R26.89 (ICD-10-CM) - Balance disorder R27.9 (ICD-10-CM) - Coordination disorder  THERAPY DIAG:  Dizziness and giddiness  Unsteadiness on feet  Cervicalgia  ONSET DATE: 5 years ago  Rationale for Evaluation and Treatment: Rehabilitation  SUBJECTIVE:   SUBJECTIVE STATEMENT: Has done "everything" in her HEP.  Had a bad pain behind the L ear since last session- lasted 20 sec and went away with massage.    Pt accompanied by: self and significant other  PERTINENT HISTORY: Anemia, anxiety, asthma, L breast CA s/p lumpectomy, GERD, HLD, HTN, osteoporosis, polymyalgia rheumatica  PAIN:  Are you having pain? Yes: NPRS scale: 0/10 Pain location: B temples and head Pain description: numbness, heaviness Aggravating factors: constant Relieving factors: nothing  PRECAUTIONS: Fall and Other: osteoporosis   WEIGHT BEARING RESTRICTIONS: No  FALLS: Has patient fallen in last 6 months? No  LIVING ENVIRONMENT: Lives with: lives with their spouse Lives in: Other senior living- independent living  Stairs: No Has following equipment  at home: Electronics engineer and Grab bars  PLOF: Independent  PATIENT GOALS: improved balance  OBJECTIVE:     TODAY'S TREATMENT: 12/28/21 Activity Comments  standing head turns to targets 30" each standing wide and romberg Good speed; mod imbalance in romberg; mild dizziness   standing head nods to targets 30" each standing wide and romberg C/o mild head pressure; mild-mod sway   1/2 turns to targets  Good quick pace; cueing to increase step height and avoid pivoting; mild dizziness and mild-mod imbalance   wall bumps hip/shoulder strategy with EO/EC Frequent cueing to decrease pace, perform in correct sequencing  cervical retraction yellow TB 10x   Good form  Red TB row 10x  CGA required for safety d/t retropulsion  Red TB extension 10x  Cues for rhythmic breathing   standing wide on foam 30" Mild sway  standing wide on foam EC Mild sway  Standing romberg foam 30"  Mild sway  Standing on foam + head turns 30" Mild sway  Standing on foam + head nods 30" Moderate imbalance           HOME EXERCISE PROGRAM Last updated: 12/24/21 Access Code: [email or web address] N URL: [email or web address] Date: 12/24/2021 Prepared by: [facility]  Exercises - Seated Left Head Turns Vestibular Habituation  - 1 x daily - 5 x weekly - 2-3 sets - 30 sec hold - Seated Head Nods Vestibular Habituation  - 1 x daily - 5 x weekly - 2-3 sets - 30 sec hold - Narrow Stance with Counter Support  - 1 x daily - 5 x weekly - 2-3 sets - 30 sec hold - Mid-Lower Cervical Extension SNAG with Strap  - 1 x daily - 5 x weekly - 2 sets - 10 reps - Seated Assisted Cervical Rotation with Towel  - 1 x daily - 5 x weekly - 2 sets - 10 reps - Cervical Retraction with Overpressure  - 1 x daily - 5 x weekly - 2 sets - 10 reps - 3 sec hold     Below measures were taken at time of initial evaluation unless otherwise specified:   DIAGNOSTIC FINDINGS: none recent  COGNITION: Overall  cognitive status: Within functional limits for tasks assessed   SENSATION: Intact with light pressure over body and face  POSTURE:  rounded shoulders and forward head   LOWER EXTREMITY MMT:   MMT Right eval Left eval  Hip flexion    Hip abduction    Hip adduction    Hip internal rotation    Hip external rotation    Knee flexion    Knee extension    Ankle dorsiflexion    Ankle plantarflexion    Ankle inversion    Ankle eversion    (Blank rows = not tested)  GAIT: Gait pattern:  short step length, slow, guarded gait Assistive device utilized: None Level of assistance: SBA  FUNCTIONAL TESTS:    M-CTSIB  Condition 1: Firm Surface, EO 30 Sec, Moderate Sway  Condition 2: Firm Surface, EC 3 Sec, Severe and LOB posteriorly  Sway  Condition 3: Foam Surface, EO NT d/t safety Sec,  -  Sway  Condition 4: Foam Surface, EC NT d/t safety Sec,  -  Sway     PATIENT SURVEYS:  FOTO 48  VESTIBULAR ASSESSMENT:  GENERAL OBSERVATION: pt wears readers     OCULOMOTOR EXAM:  Ocular Alignment: normal  Ocular ROM: No Limitations  Spontaneous Nystagmus: absent  Gaze-Induced Nystagmus: absent  Smooth Pursuits: intact and pt reports sensation of "cross eyed"  Saccades: intact and c/o some wooziness  Convergence/Divergence: "trouble seeing" at ~ 1 foot away; c/o dizziness    VESTIBULAR - OCULAR REFLEX:   Slow VOR: Comment: c/o dizziness, diplopia, "not balanced" ; head nods/turns guarded and discontinuous  VOR Cancellation: Comment: no sx; trouble coordinating movement   Head-Impulse Test: HIT Right: slight positive  HIT Left: negative      POSITIONAL TESTING:  Right Roll Test: negative; c/o mild dizziness with movement, improved with holding position Left Roll Test: c/o mild dizziness with movement, improved with holding position Right Sidelying: negative; c/o very mild dizziness upon sitting up  Left Sidelying: negative; c/o very mild dizziness upon sitting up      VESTIBULAR TREATMENT:                                                                                                   DATE: 12/13/21   PATIENT EDUCATION: Education details: prognosis, POC, HEP, exam findings  Person educated: Patient and Spouse Education method: Explanation, Demonstration, Tactile cues, Verbal cues, and Handouts Education comprehension: verbalized understanding and returned demonstration  HOME EXERCISE PROGRAM: Access Code: [email or web address] N URL: [email or web address] Date: 12/13/2021 Prepared by: [facility]  Exercises - Seated Left Head Turns Vestibular Habituation  - 1 x daily - 5 x weekly - 2-3 sets - 30 sec hold - Seated Head Nods Vestibular Habituation  - 1 x daily - 5 x weekly - 2-3 sets - 30 sec hold - Narrow Stance with Counter Support  - 1 x daily - 5 x weekly - 2-3 sets - 30 sec hold   GOALS: Goals reviewed with patient? Yes  SHORT TERM GOALS: Target date: 01/03/2022  Patient to be independent with initial HEP. Baseline: HEP initiated Goal status: IN PROGRESS    LONG TERM GOALS: Target date: 02/07/2022  Patient to be independent with advanced HEP. Baseline: Not yet initiated  Goal status: IN PROGRESS  Patient to report 0/10 dizziness with standing vertical and horizontal VOR for 30 seconds. Baseline: Unable Goal status: IN PROGRESS  Patient will report 0/10 dizziness with bed mobility.  Baseline: Symptomatic  Goal status: IN PROGRESS  Patient to demonstrate mild sway with M-CTSIB condition with eyes closed/FIRM surface for 30 sec in order to improve safety in environments with uneven surfaces and dim lighting.  Baseline: 3 sec before LOB Goal status: IN PROGRESS  Patient to score at least 20/24 on DGI in order to decrease risk of falls. Baseline: 13 Goal status: IN PROGRESS  Patient to demonstrate cervical AROM WFL and without pain limiting in order to improve ability to scan  environment. Baseline:  Active ROM A/PROM (deg) eval  Flexion 25  Extension 18  Right lateral flexion 25  Left lateral flexion 21  Right rotation 37  Left rotation 51   (Blank rows = not tested) Goal status: IN PROGRESS  Patient to score at least 55 on FOTO in order to indicate improved functional outcomes.  Baseline: 48 Goal status: IN PROGRESS    ASSESSMENT:  CLINICAL IMPRESSION: Patient arrived to session with compliance with HEP. Patient performed head and body turns at good quick speed with some imbalance evident but only mild dizziness. Tolerance and confidence with quick movements appears significantly improved compared to initial eval. Worked on balance strategies with EC with patient demonstrating limited control and reliance on momentum. Able to progress to Romberg and stance on foam with several balance activities today. Patient tolerated session well despite c/o continued head pressure.   OBJECTIVE IMPAIRMENTS: Abnormal gait, decreased balance, decreased ROM, dizziness, impaired flexibility, improper body mechanics, postural dysfunction, and pain.   ACTIVITY LIMITATIONS: carrying, lifting, bending, sitting, standing, squatting, sleeping, stairs, transfers, bed mobility, bathing, dressing, reach over head, and caring for others  PARTICIPATION LIMITATIONS: meal prep, cleaning, laundry, shopping, community activity, and church  PERSONAL FACTORS: Age, Fitness, Past/current experiences, Time since onset of injury/illness/exacerbation, and 3+ comorbidities: Anemia, anxiety, asthma, L breast CA s/p lumpectomy, GERD, HLD, HTN, osteoporosis, polymyalgia rheumatica  are also affecting patient's functional outcome.   REHAB POTENTIAL: Good  CLINICAL DECISION MAKING: Evolving/moderate complexity  EVALUATION COMPLEXITY: Moderate   PLAN:  PT FREQUENCY: 2x/week  PT DURATION: 8 weeks  PLANNED INTERVENTIONS: Therapeutic exercises, Therapeutic activity, Neuromuscular  re-education, Balance training, Gait training, Patient/Family education, Self Care, Joint mobilization, Stair training, Vestibular training, Canalith repositioning, DME instructions, Aquatic Therapy, Dry Needling, Electrical stimulation, Cryotherapy, Moist heat, Taping, Manual therapy, and Re-evaluation  PLAN FOR NEXT SESSION: progress HEP with cervical ROM, strengthening, stretching, reassess HEP, progress VOR and habituation    Janene Harvey, PT, DPT 12/28/21 12:26 PM  [facility] at [facility] [address], [place], [place] [address] Phone # [phone] Fax # [phone]

## 2021-12-28 ENCOUNTER — Encounter: Payer: Self-pay | Admitting: Physical Therapy

## 2021-12-28 ENCOUNTER — Ambulatory Visit: Payer: Medicare Other | Admitting: Physical Therapy

## 2021-12-28 DIAGNOSIS — R2681 Unsteadiness on feet: Secondary | ICD-10-CM

## 2021-12-28 DIAGNOSIS — M542 Cervicalgia: Secondary | ICD-10-CM

## 2021-12-28 DIAGNOSIS — R42 Dizziness and giddiness: Secondary | ICD-10-CM

## 2021-12-29 ENCOUNTER — Ambulatory Visit (INDEPENDENT_AMBULATORY_CARE_PROVIDER_SITE_OTHER): Payer: Medicare Other | Admitting: Gastroenterology

## 2021-12-29 ENCOUNTER — Encounter: Payer: Self-pay | Admitting: Gastroenterology

## 2021-12-29 VITALS — BP 90/68 | HR 79 | Ht <= 58 in | Wt 98.5 lb

## 2021-12-29 DIAGNOSIS — Z86010 Personal history of colonic polyps: Secondary | ICD-10-CM

## 2021-12-29 DIAGNOSIS — R079 Chest pain, unspecified: Secondary | ICD-10-CM

## 2021-12-29 DIAGNOSIS — I6523 Occlusion and stenosis of bilateral carotid arteries: Secondary | ICD-10-CM

## 2021-12-29 DIAGNOSIS — K219 Gastro-esophageal reflux disease without esophagitis: Secondary | ICD-10-CM

## 2021-12-29 MED ORDER — FAMOTIDINE 40 MG PO TABS: 40.0000 mg | ORAL_TABLET | Freq: Two times a day (BID) | ORAL | 11 refills | Status: DC

## 2021-12-29 NOTE — Patient Instructions (Addendum)
We have sent the following medications to your pharmacy for you to pick up at your convenience: famotidine.   You have been scheduled for a Barium Esophogram at [facility] (1st floor of the hospital) on 01/06/22 at 10:00am. Please arrive 30 minutes prior to your appointment for registration. Make certain not to have anything to eat or drink 3 hours prior to your test. If you need to reschedule for any reason, please contact radiology at [phone] to do so. __________________________________________________________________ A barium swallow is an examination that concentrates on views of the esophagus. This tends to be a double contrast exam (barium and two liquids which, when combined, create a gas to distend the wall of the oesophagus) or single contrast (non-ionic iodine based). The study is usually tailored to your symptoms so a good history is essential. Attention is paid during the study to the form, structure and configuration of the esophagus, looking for functional disorders (such as aspiration, dysphagia, achalasia, motility and reflux) EXAMINATION You may be asked to change into a gown, depending on the type of swallow being performed. A radiologist and radiographer will perform the procedure. The radiologist will advise you of the type of contrast selected for your procedure and direct you during the exam. You will be asked to stand, sit or lie in several different positions and to hold a small amount of fluid in your mouth before being asked to swallow while the imaging is performed .In some instances you may be asked to swallow barium coated marshmallows to assess the motility of a solid food bolus. The exam can be recorded as a digital or video fluoroscopy procedure. POST PROCEDURE It will take 1-2 days for the barium to pass through your system. To facilitate this, it is important, unless otherwise directed, to increase your fluids for the next 24-48hrs and to resume your normal  diet.  This test typically takes about 30 minutes to perform. __________________________________________________________________________________  The [facility] providers would like to encourage you to use [facility] to communicate with providers for non-urgent requests or questions.  Due to long hold times on the telephone, sending your provider a message by [facility] may be a faster and more efficient way to get a response.  Please allow 48 business hours for a response.  Please remember that this is for non-urgent requests.   Due to recent changes in healthcare laws, you may see the results of your imaging and laboratory studies on MyChart before your provider has had a chance to review them.  We understand that in some cases there may be results that are confusing or concerning to you. Not all laboratory results come back in the same time frame and the provider may be waiting for multiple results in order to interpret others.  Please give [place] 48 hours in order for your provider to thoroughly review all the results before contacting the office for clarification of your results.   Thank you for choosing me and [facility].  Pricilla Riffle. Dagoberto Ligas., MD., Marval Regal

## 2021-12-29 NOTE — Progress Notes (Signed)
    Assessment     Chest symptoms, etiology unclear, not typical for esophageal symptoms. CIC Family history of colon cancer, multiple second-degree relatives and personal history of adenomatous colon polyps Periumbilical hernia   Recommendations    Schedule barium esophagram.  We discussed the option of EGD.  She prefers to avoid sedation.  Reconsider EGD pending esophagram findings Continue MIralax prn and MgOx prn Surveillance colonoscopy in Sept 2024 Expectant management of hernia.  If symptoms worsen surgical referral for further evaluation   HPI    This is a 76 year old female with GERD and intermittent chest symptoms.  She is accompanied by her husband.  She describes very brief episodes of twisting or flipping sensation in her upper chest and neck.  The symptoms occur randomly.  They are not associated with swallowing meals or exertion.  She is maintained on famotidine 40 mg twice daily for GERD with very good symptom control.  She discontinued pantoprazole due to her history of osteoporosis.  EGD June 2011 showed gastritis. Path: PPI effect   Labs / Imaging       Latest Ref Rng & Units 11/26/2021    7:53 AM 05/31/2021   10:00 AM 03/08/2021    1:55 PM  Hepatic Function  Total Protein 6.0 - 8.5 g/dL 7.4  8.0  7.8   Albumin 3.8 - 4.8 g/dL 4.8  4.7  4.8   AST 0 - 40 IU/L _0 ALT 0 - 32 IU/L _1 Alk Phosphatase 44 - 121 IU/L 67  59  56   Total Bilirubin 0.0 - 1.2 mg/dL 0.4  0.5  0.5   Bilirubin, Direct 0.00 - 0.40 mg/dL 0.15          Latest Ref Rng & Units 05/31/2021   10:00 AM 03/08/2021    1:55 PM 12/07/2020   11:34 AM  CBC  WBC 4.0 - 10.5 K/uL 5.6  6.9  5.1   Hemoglobin 12.0 - 15.0 g/dL 13.5  13.1  13.1   Hematocrit 36.0 - 46.0 % 40.3  39.1  38.8   Platelets 150.0 - 400.0 K/uL 256.0  263.0  264.0     Current Medications, Allergies, Past Medical History, Past Surgical History, Family History and Social History were reviewed in [employer] record.   Physical Exam: General: Well developed, well nourished, elderly, no acute distress Head: Normocephalic and atraumatic Eyes: Sclerae anicteric, EOMI Ears: Normal auditory acuity Mouth: No deformities or lesions noted Lungs: Clear throughout to auscultation Heart: Regular rate and rhythm; No murmurs, rubs or bruits Abdomen: Soft, non tender and non distended. No masses, hepatosplenomegaly noted.  Well healed periumbilical incision, periumbilical hernia.  Normal Bowel sounds Rectal: Not done Musculoskeletal: Symmetrical with no gross deformities  Pulses:  Normal pulses noted Extremities: No edema or deformities noted Neurological: Alert oriented x 4, grossly nonfocal Psychological:  Alert and cooperative. Normal mood and affect   Sieara Bremer T. Fuller Plan, MD 12/29/2021, 2:32 PM

## 2021-12-29 NOTE — Therapy (Signed)
OUTPATIENT PHYSICAL THERAPY VESTIBULAR TREATMENT     Patient Name: Amanda Cannon MRN: [id number] DOB:12-Nov-1945, 76 y.o., female Today's Date: 12/30/2021  END OF SESSION:  PT End of Session - 12/30/21 1441     Visit Number 5    Number of Visits 17    Date for PT Re-Evaluation 02/07/22    Authorization Type Medicare/AARP    PT Start Time 1402    PT Stop Time 1441    PT Time Calculation (min) 39 min    Activity Tolerance Patient tolerated treatment well    Behavior During Therapy WFL for tasks assessed/performed                Past Medical History:  Diagnosis Date   Alcohol use    Anemia    Anxiety    Asthma    Basal cell carcinoma    forehead   Breast cancer ([place]) 1998   left    Colon polyps    GERD (gastroesophageal reflux disease)    Glaucoma    Hyperlipidemia    Hypertension    Hyponatremia    remote, mild   Mitral valve prolapse    Osteoarthritis    sees rheumatologist   Osteoporosis    sees rheumatologist   Personal history of radiation therapy 1998   PMR (polymyalgia rheumatica) ([place])    sees rheumatologist   Pneumonia    Prinzmetal angina ([place])    White matter changes    ? ischemic sm vs per pt   Past Surgical History:  Procedure Laterality Date   ABDOMINAL HYSTERECTOMY     BREAST LUMPECTOMY Left 1998   cancer   CESAREAN SECTION     x 4   COLONOSCOPY  07/2012   in [place]  2005   abdominal hernia repair   HYSTEROTOMY     LYMPH NODE DISSECTION Left    POLYPECTOMY     Patient Active Problem List   Diagnosis Date Noted   DDD (degenerative disc disease), cervical 06/22/2021   History of tamoxifen therapy 05/13/2020   Increased risk of breast cancer 05/13/2020   Degenerative cervical disc 01/01/2020   Vertigo 08/06/2019   Cough 08/06/2019   Mixed rhinitis 08/06/2019   Gastroesophageal reflux disease 08/06/2019   Vestibular disorder 04/09/2019   Bilateral hearing loss 03/23/2018   ETD (Eustachian tube  dysfunction), bilateral 03/23/2018   Facial numbness 03/23/2018   Age-related osteoporosis without current pathological fracture 01/15/2018   Breast pain, left 01/15/2018   Malignant neoplasm of lower-outer quadrant of left breast of female, estrogen receptor positive ([place]) 01/15/2018   Trigeminal neuropathy 11/13/2017   Pain in right hand 05/25/2017   PMR (polymyalgia rheumatica) ([place]) 04/24/2017   Hyponatremia 07/15/2015   Hyperlipidemia 07/15/2015   Esophageal spasm 07/15/2015   Essential hypertension 07/15/2015   Depression 07/15/2015   Asthma 07/15/2015   Tinnitus 07/15/2015   Osteoporosis 07/15/2015   Spasm    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 Almost fell while dancing yesterday.  Pt accompanied by: self and significant other  PERTINENT HISTORY: Anemia, anxiety, asthma, L breast CA s/p lumpectomy, GERD, HLD, HTN, osteoporosis, polymyalgia rheumatica  PAIN:  Are you having pain? Yes: NPRS scale: 5/10 Pain location: B temples and head Pain description: numbness, heaviness Aggravating factors: constant Relieving factors: nothing  PRECAUTIONS: Fall and Other: osteoporosis   WEIGHT BEARING RESTRICTIONS: No  FALLS: Has patient fallen in last 6 months? 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  PLOF: Independent  PATIENT GOALS:  improved balance  OBJECTIVE:     TODAY'S TREATMENT: 12/30/21 Activity Comments  Sitting cervical extension SNAG 10x To tolerance; reported mild dizziness but no change in head pressure   brandt daroff EO/EC 2x each side 6/10 head pressure/woozy; 7/10 dizziness with EC  rolling EO/EC 2x each Able to perform at quick pace without c/o dizziness or head pressure   Cervical traction  In sitting reported 3-4/10 head pressure, reduced to 2/10 with traction for only 5 sec  STM to cervical musculature, manual cervical traction with towel 3x30", suboccipital release, gentle grade II-III R and L unilateral cervical jt mobs Report of improved head pressure upon sitting; very hypomobile throughout C-spine and soft tissue restriction in R>L paraspinals, suboccipitals     PATIENT EDUCATION: Education details: spoke to pt about symptoms and how traction seems to improve it, however personal traction unit may not be beneficial d/t transient and short-lived benefit; answered pt's questions on possible need to f/u with with neurology to address pt's concerns about possible trigeminal neuralgia  Person educated: Patient Education method: Explanation, Demonstration, Tactile cues, and Verbal cues Education comprehension: verbalized understanding and returned demonstration   HOME EXERCISE PROGRAM Last updated: 12/24/21 Access Code: [email or web address] URL: [email or web address] Date: 12/24/2021 Prepared by: [facility]  Exercises - Seated Left Head Turns Vestibular Habituation  - 1 x daily - 5 x weekly - 2-3 sets - 30 sec hold - Seated Head Nods Vestibular Habituation  - 1 x daily - 5 x weekly - 2-3 sets - 30 sec hold - Narrow Stance with Counter Support  - 1 x daily - 5 x weekly - 2-3 sets - 30 sec hold - Mid-Lower Cervical Extension SNAG with Strap  - 1 x daily - 5 x weekly - 2 sets - 10 reps - Seated Assisted Cervical Rotation with Towel  - 1 x daily - 5 x  weekly - 2 sets - 10 reps - Cervical Retraction with Overpressure  - 1 x daily - 5 x weekly - 2 sets - 10 reps - 3 sec hold     Below measures were taken at time of initial evaluation unless otherwise specified:   DIAGNOSTIC FINDINGS: none recent  COGNITION: Overall cognitive status: Within functional limits for tasks assessed   SENSATION: Intact with light pressure over body and face  POSTURE:  rounded shoulders and forward head   LOWER EXTREMITY MMT:   MMT Right eval Left eval  Hip flexion    Hip abduction    Hip adduction    Hip internal rotation    Hip external rotation    Knee flexion    Knee extension    Ankle dorsiflexion    Ankle plantarflexion    Ankle inversion    Ankle eversion    (Blank rows = not tested)  GAIT: Gait pattern:  short step length, slow, guarded gait Assistive device utilized: None Level of assistance: SBA  FUNCTIONAL TESTS:    M-CTSIB  Condition 1: Firm Surface, EO 30 Sec, Moderate Sway  Condition 2: Firm Surface, EC 3 Sec, Severe and LOB posteriorly  Sway  Condition 3: Foam Surface, EO NT d/t safety Sec,  -  Sway  Condition 4: Foam Surface, EC NT d/t safety Sec,  -  Sway     PATIENT SURVEYS:  FOTO 48  VESTIBULAR ASSESSMENT:  GENERAL OBSERVATION: pt wears readers     OCULOMOTOR EXAM:  Ocular Alignment: normal  Ocular ROM: No Limitations  Spontaneous Nystagmus: absent  Gaze-Induced Nystagmus: absent  Smooth Pursuits: intact and pt reports sensation of "cross eyed"  Saccades: intact and c/o some wooziness  Convergence/Divergence: "trouble seeing" at ~ 1 foot away; c/o dizziness    VESTIBULAR - OCULAR REFLEX:   Slow VOR: Comment: c/o dizziness, diplopia, "not balanced" ; head nods/turns guarded and discontinuous  VOR Cancellation: Comment: no sx; trouble coordinating movement   Head-Impulse Test: HIT Right: slight positive  HIT Left: negative      POSITIONAL TESTING:  Right Roll Test: negative; c/o mild dizziness  with movement, improved with holding position Left Roll Test: c/o mild dizziness with movement, improved with holding position Right Sidelying: negative; c/o very mild dizziness upon sitting up  Left Sidelying: negative; c/o very mild dizziness upon sitting up     VESTIBULAR TREATMENT:                                                                                                   DATE: 12/13/21   PATIENT EDUCATION: Education details: prognosis, POC, HEP, exam findings  Person educated: Patient and Spouse Education method: Explanation, Demonstration, Tactile cues, Verbal cues, and Handouts Education comprehension: verbalized understanding and returned demonstration  HOME EXERCISE PROGRAM: Access Code: [email or web address] URL: [email or web address] Date: 12/13/2021 Prepared by: [facility]  Exercises - Seated Left Head Turns Vestibular Habituation  - 1 x daily - 5 x weekly - 2-3 sets - 30 sec hold - Seated Head Nods Vestibular Habituation  - 1 x daily - 5 x weekly - 2-3 sets - 30 sec hold - Narrow Stance with Counter Support  - 1 x daily - 5 x weekly - 2-3 sets - 30 sec hold   GOALS: Goals reviewed with patient? Yes  SHORT TERM GOALS: Target date: 01/03/2022  Patient to be independent with initial HEP. Baseline: HEP initiated Goal status: IN PROGRESS    LONG TERM GOALS: Target date: 02/07/2022  Patient to be independent with advanced HEP. Baseline: Not yet initiated  Goal status: IN PROGRESS  Patient to report 0/10 dizziness with standing vertical and horizontal VOR for 30 seconds. Baseline: Unable Goal status: IN PROGRESS  Patient will report 0/10 dizziness with bed mobility.  Baseline: Symptomatic  Goal status: IN PROGRESS  Patient to demonstrate mild sway with M-CTSIB condition with eyes closed/FIRM surface for 30 sec in order to improve safety in environments with uneven surfaces and dim lighting. Baseline: 3 sec before  LOB Goal status: IN PROGRESS  Patient to score at least 20/24 on DGI in order to decrease risk of falls. Baseline: 13 Goal status: IN PROGRESS  Patient to demonstrate cervical AROM WFL and without pain limiting in order to improve ability to scan environment. Baseline:  Active ROM A/PROM (deg) eval  Flexion 25  Extension 18  Right lateral flexion 25  Left lateral flexion 21  Right rotation 37  Left rotation 51   (Blank rows = not tested) Goal status: IN PROGRESS  Patient to score at least 55 on FOTO in order to indicate improved functional outcomes.  Baseline: 48 Goal status: IN PROGRESS    ASSESSMENT:  CLINICAL IMPRESSION: Patient arrived to session with report of increased head pressure sensation after drinking water earlier. Cervical extension stretching was tolerated well and brought on very mild dizziness but no change in head pressure. Worked on progressing habituation to [facility] with mild increase in symptoms. Able to perform rolling without symptoms with EO/EC- patient does not report any symptoms upon laying down but symptoms increased immediately upon sitting up. Trialed manual cervical traction in sitting which did improve this sensation transiently. Manual therapy focused on improving joint mobility and soft tissue restriction- patient reported longer-lasting improvement in sx. Noted "I feel a lot better" upon leaving.   OBJECTIVE IMPAIRMENTS: Abnormal gait, decreased balance, decreased ROM, dizziness, impaired flexibility, improper body mechanics, postural dysfunction, and pain.   ACTIVITY LIMITATIONS: carrying, lifting, bending, sitting, standing, squatting, sleeping, stairs, transfers, bed mobility, bathing, dressing, reach over head, and caring for others  PARTICIPATION LIMITATIONS: meal prep, cleaning, laundry, shopping, community activity, and church  PERSONAL FACTORS: Age, Fitness, Past/current experiences, Time since onset of injury/illness/exacerbation, and 3+  comorbidities: Anemia, anxiety, asthma, L breast CA s/p lumpectomy, GERD, HLD, HTN, osteoporosis, polymyalgia rheumatica  are also affecting patient's functional outcome.   REHAB POTENTIAL: Good  CLINICAL DECISION MAKING: Evolving/moderate complexity  EVALUATION COMPLEXITY: Moderate   PLAN:  PT FREQUENCY: 2x/week  PT DURATION: 8 weeks  PLANNED INTERVENTIONS: Therapeutic exercises, Therapeutic activity, Neuromuscular re-education, Balance training, Gait training, Patient/Family education, Self Care, Joint mobilization, Stair training, Vestibular training, Canalith repositioning, DME instructions, Aquatic Therapy, Dry Needling, Electrical stimulation, Cryotherapy, Moist heat, Taping, Manual therapy, and Re-evaluation  PLAN FOR NEXT SESSION: progress HEP with cervical ROM, strengthening, stretching, reassess HEP, progress VOR and habituation    Janene Harvey, PT, DPT 12/30/21 2:46 PM  [facility] at [facility] [address] [place], [place] [address] Phone # [phone] Fax # [phone]

## 2021-12-30 ENCOUNTER — Ambulatory Visit: Payer: Medicare Other | Admitting: Physical Therapy

## 2021-12-30 ENCOUNTER — Encounter: Payer: Self-pay | Admitting: Physical Therapy

## 2021-12-30 DIAGNOSIS — R2681 Unsteadiness on feet: Secondary | ICD-10-CM | POA: Diagnosis not present

## 2021-12-30 DIAGNOSIS — M542 Cervicalgia: Secondary | ICD-10-CM

## 2021-12-30 DIAGNOSIS — R42 Dizziness and giddiness: Secondary | ICD-10-CM | POA: Diagnosis not present

## 2022-01-03 NOTE — Therapy (Incomplete)
OUTPATIENT PHYSICAL THERAPY VESTIBULAR TREATMENT     Patient Name: Amanda Cannon MRN: [id number] DOB:[birth year], 76 y.o., female Today's Date: 01/03/2022  END OF SESSION:       Past Medical History:  Diagnosis Date   Alcohol use    Anemia    Anxiety    Asthma    Basal cell carcinoma    forehead   Breast cancer ([place]) 1998   left    Colon polyps    GERD (gastroesophageal reflux disease)    Glaucoma    Hyperlipidemia    Hypertension    Hyponatremia    remote, mild   Mitral valve prolapse    Osteoarthritis    sees rheumatologist   Osteoporosis    sees rheumatologist   Personal history of radiation therapy 1998   PMR (polymyalgia rheumatica) ([place])    sees rheumatologist   Pneumonia    Prinzmetal angina ([place])    White matter changes    ? ischemic sm vs per pt   Past Surgical History:  Procedure Laterality Date   ABDOMINAL HYSTERECTOMY     BREAST LUMPECTOMY Left 1998   cancer   CESAREAN SECTION     x 4   COLONOSCOPY  07/2012   in [place]  2005   abdominal hernia repair   HYSTEROTOMY     LYMPH NODE DISSECTION Left    POLYPECTOMY     Patient Active Problem List   Diagnosis Date Noted   DDD (degenerative disc disease), cervical 06/22/2021   History of tamoxifen therapy 05/13/2020   Increased risk of breast cancer 05/13/2020   Degenerative cervical disc 01/01/2020   Vertigo 08/06/2019   Cough 08/06/2019   Mixed rhinitis 08/06/2019   Gastroesophageal reflux disease 08/06/2019   Vestibular disorder 04/09/2019   Bilateral hearing loss 03/23/2018   ETD (Eustachian tube dysfunction), bilateral 03/23/2018   Facial numbness 03/23/2018   Age-related osteoporosis without current pathological fracture 01/15/2018   Breast pain, left 01/15/2018   Malignant neoplasm of lower-outer quadrant of left breast of female, estrogen receptor positive ([place]) 01/15/2018   Trigeminal neuropathy 11/13/2017   Pain in right hand 05/25/2017   PMR (polymyalgia  rheumatica) ([place]) 04/24/2017   Hyponatremia 07/15/2015   Hyperlipidemia 07/15/2015   Esophageal spasm 07/15/2015   Essential hypertension 07/15/2015   Depression 07/15/2015   Asthma 07/15/2015   Tinnitus 07/15/2015   Osteoporosis 07/15/2015   Spasm    Prinzmetal angina ([place])    Mitral valve prolapse    White matter changes    Osteoarthritis 01/24/2013    PCP: [name], MD  REFERRING PROVIDER: Lyndal Pulley, DO  REFERRING DIAG: R26.89 (ICD-10-CM) - Balance disorder R27.9 (ICD-10-CM) - Coordination disorder  THERAPY DIAG:  No diagnosis found.  ONSET DATE: 5 years ago  Rationale for Evaluation and Treatment: Rehabilitation  SUBJECTIVE:   SUBJECTIVE STATEMENT: Reports some head pressure after drinking some water. Almost fell while dancing yesterday.    Pt accompanied by: self and significant other  PERTINENT HISTORY: Anemia, anxiety, asthma, L breast CA s/p lumpectomy, GERD, HLD, HTN, osteoporosis, polymyalgia rheumatica  PAIN:  Are you having pain? Yes: NPRS scale: 5/10 Pain location: B temples and head Pain description: numbness, heaviness Aggravating factors: constant Relieving factors: nothing  PRECAUTIONS: Fall and Other: osteoporosis   WEIGHT BEARING RESTRICTIONS: No  FALLS: Has patient fallen in last 6 months? No  LIVING ENVIRONMENT: Lives with: lives with their spouse Lives in: Other senior living- independent living  Stairs: No  Has following equipment at home: Shower bench and Grab bars  PLOF: Independent  PATIENT GOALS: improved balance  OBJECTIVE:      TODAY'S TREATMENT: 01/04/22 Activity Comments                        HOME EXERCISE PROGRAM Last updated: 12/24/21 Access Code: [email or web address] N URL: [email or web address] Date: 12/24/2021 Prepared by: [facility]  Exercises - Seated Left Head Turns Vestibular Habituation  - 1 x daily - 5 x weekly - 2-3 sets - 30 sec  hold - Seated Head Nods Vestibular Habituation  - 1 x daily - 5 x weekly - 2-3 sets - 30 sec hold - Narrow Stance with Counter Support  - 1 x daily - 5 x weekly - 2-3 sets - 30 sec hold - Mid-Lower Cervical Extension SNAG with Strap  - 1 x daily - 5 x weekly - 2 sets - 10 reps - Seated Assisted Cervical Rotation with Towel  - 1 x daily - 5 x weekly - 2 sets - 10 reps - Cervical Retraction with Overpressure  - 1 x daily - 5 x weekly - 2 sets - 10 reps - 3 sec hold     Below measures were taken at time of initial evaluation unless otherwise specified:   DIAGNOSTIC FINDINGS: none recent  COGNITION: Overall cognitive status: Within functional limits for tasks assessed   SENSATION: Intact with light pressure over body and face  POSTURE:  rounded shoulders and forward head   LOWER EXTREMITY MMT:   MMT Right eval Left eval  Hip flexion    Hip abduction    Hip adduction    Hip internal rotation    Hip external rotation    Knee flexion    Knee extension    Ankle dorsiflexion    Ankle plantarflexion    Ankle inversion    Ankle eversion    (Blank rows = not tested)  GAIT: Gait pattern:  short step length, slow, guarded gait Assistive device utilized: None Level of assistance: SBA  FUNCTIONAL TESTS:    M-CTSIB  Condition 1: Firm Surface, EO 30 Sec, Moderate Sway  Condition 2: Firm Surface, EC 3 Sec, Severe and LOB posteriorly  Sway  Condition 3: Foam Surface, EO NT d/t safety Sec,  -  Sway  Condition 4: Foam Surface, EC NT d/t safety Sec,  -  Sway     PATIENT SURVEYS:  FOTO 48  VESTIBULAR ASSESSMENT:  GENERAL OBSERVATION: pt wears readers     OCULOMOTOR EXAM:  Ocular Alignment: normal  Ocular ROM: No Limitations  Spontaneous Nystagmus: absent  Gaze-Induced Nystagmus: absent  Smooth Pursuits: intact and pt reports sensation of "cross eyed"  Saccades: intact and c/o some wooziness  Convergence/Divergence: "trouble seeing" at ~ 1 foot away; c/o dizziness     VESTIBULAR - OCULAR REFLEX:   Slow VOR: Comment: c/o dizziness, diplopia, "not balanced" ; head nods/turns guarded and discontinuous  VOR Cancellation: Comment: no sx; trouble coordinating movement   Head-Impulse Test: HIT Right: slight positive  HIT Left: negative      POSITIONAL TESTING:  Right Roll Test: negative; c/o mild dizziness with movement, improved with holding position Left Roll Test: c/o mild dizziness with movement, improved with holding position Right Sidelying: negative; c/o very mild dizziness upon sitting up  Left Sidelying: negative; c/o very mild dizziness upon sitting up     VESTIBULAR TREATMENT:  DATE: 12/13/21   PATIENT EDUCATION: Education details: prognosis, POC, HEP, exam findings  Person educated: Patient and Spouse Education method: Explanation, Demonstration, Tactile cues, Verbal cues, and Handouts Education comprehension: verbalized understanding and returned demonstration  HOME EXERCISE PROGRAM: Access Code: [email or web address] N URL: [email or web address] Date: 12/13/2021 Prepared by: [facility]  Exercises - Seated Left Head Turns Vestibular Habituation  - 1 x daily - 5 x weekly - 2-3 sets - 30 sec hold - Seated Head Nods Vestibular Habituation  - 1 x daily - 5 x weekly - 2-3 sets - 30 sec hold - Narrow Stance with Counter Support  - 1 x daily - 5 x weekly - 2-3 sets - 30 sec hold   GOALS: Goals reviewed with patient? Yes  SHORT TERM GOALS: Target date: 01/03/2022  Patient to be independent with initial HEP. Baseline: HEP initiated Goal status: IN PROGRESS    LONG TERM GOALS: Target date: 02/07/2022  Patient to be independent with advanced HEP. Baseline: Not yet initiated  Goal status: IN PROGRESS  Patient to report 0/10 dizziness with standing vertical and horizontal VOR for 30  seconds. Baseline: Unable Goal status: IN PROGRESS  Patient will report 0/10 dizziness with bed mobility.  Baseline: Symptomatic  Goal status: IN PROGRESS  Patient to demonstrate mild sway with M-CTSIB condition with eyes closed/FIRM surface for 30 sec in order to improve safety in environments with uneven surfaces and dim lighting. Baseline: 3 sec before LOB Goal status: IN PROGRESS  Patient to score at least 20/24 on DGI in order to decrease risk of falls. Baseline: 13 Goal status: IN PROGRESS  Patient to demonstrate cervical AROM WFL and without pain limiting in order to improve ability to scan environment. Baseline:  Active ROM A/PROM (deg) eval  Flexion 25  Extension 18  Right lateral flexion 25  Left lateral flexion 21  Right rotation 37  Left rotation 51   (Blank rows = not tested) Goal status: IN PROGRESS  Patient to score at least 55 on FOTO in order to indicate improved functional outcomes.  Baseline: 48 Goal status: IN PROGRESS    ASSESSMENT:  CLINICAL IMPRESSION: Patient arrived to session with report of increased head pressure sensation after drinking water earlier. Cervical extension stretching was tolerated well and brought on very mild dizziness but no change in head pressure. Worked on progressing habituation to [facility] with mild increase in symptoms. Able to perform rolling without symptoms with EO/EC- patient does not report any symptoms upon laying down but symptoms increased immediately upon sitting up. Trialed manual cervical traction in sitting which did improve this sensation transiently. Manual therapy focused on improving joint mobility and soft tissue restriction- patient reported longer-lasting improvement in sx. Noted "I feel a lot better" upon leaving.   OBJECTIVE IMPAIRMENTS: Abnormal gait, decreased balance, decreased ROM, dizziness, impaired flexibility, improper body mechanics, postural dysfunction, and pain.   ACTIVITY LIMITATIONS: carrying,  lifting, bending, sitting, standing, squatting, sleeping, stairs, transfers, bed mobility, bathing, dressing, reach over head, and caring for others  PARTICIPATION LIMITATIONS: meal prep, cleaning, laundry, shopping, community activity, and church  PERSONAL FACTORS: Age, Fitness, Past/current experiences, Time since onset of injury/illness/exacerbation, and 3+ comorbidities: Anemia, anxiety, asthma, L breast CA s/p lumpectomy, GERD, HLD, HTN, osteoporosis, polymyalgia rheumatica  are also affecting patient's functional outcome.   REHAB POTENTIAL: Good  CLINICAL DECISION MAKING: Evolving/moderate complexity  EVALUATION COMPLEXITY: Moderate   PLAN:  PT FREQUENCY: 2x/week  PT DURATION: 8 weeks  PLANNED  INTERVENTIONS: Therapeutic exercises, Therapeutic activity, Neuromuscular re-education, Balance training, Gait training, Patient/Family education, Self Care, Joint mobilization, Stair training, Vestibular training, Canalith repositioning, DME instructions, Aquatic Therapy, Dry Needling, Electrical stimulation, Cryotherapy, Moist heat, Taping, Manual therapy, and Re-evaluation  PLAN FOR NEXT SESSION: progress HEP with cervical ROM, strengthening, stretching, reassess HEP, progress VOR and habituation    Janene Harvey, PT, DPT 01/03/22 8:30 AM  [facility] at [facility] [address] [place], [place] [address] Phone # [phone] Fax # [phone]

## 2022-01-04 ENCOUNTER — Ambulatory Visit: Payer: Medicare Other | Admitting: Physical Therapy

## 2022-01-04 ENCOUNTER — Other Ambulatory Visit: Payer: Self-pay | Admitting: *Deleted

## 2022-01-04 MED ORDER — FLUTICASONE PROPIONATE 50 MCG/ACT NA SUSP: 2.0000 | Freq: Every day | NASAL | 2 refills | Status: DC

## 2022-01-04 NOTE — Telephone Encounter (Signed)
Patient called and expressed that she and her husband both have Covid and that they both needed refills for Flonase. I have sent in refills for patient and her husband to [facility] on [address] as requested.

## 2022-01-06 ENCOUNTER — Other Ambulatory Visit (HOSPITAL_COMMUNITY): Payer: Medicare Other

## 2022-01-06 ENCOUNTER — Ambulatory Visit: Payer: Medicare Other | Admitting: Physical Therapy

## 2022-01-11 ENCOUNTER — Ambulatory Visit: Payer: Medicare Other | Admitting: Physical Therapy

## 2022-01-13 ENCOUNTER — Ambulatory Visit: Payer: Medicare Other | Admitting: Physical Therapy

## 2022-01-13 NOTE — Therapy (Signed)
OUTPATIENT PHYSICAL THERAPY VESTIBULAR TREATMENT     Patient Name: Amanda Cannon MRN: [id number] DOB:July 07, 1945, 76 y.o., female Today's Date: 01/18/2022  END OF SESSION:  PT End of Session - 01/18/22 0920     Visit Number 6    Number of Visits 17    Date for PT Re-Evaluation 02/07/22    Authorization Type Medicare/AARP    PT Start Time 0836    PT Stop Time 0918    PT Time Calculation (min) 42 min    Equipment Utilized During Treatment Gait belt    Activity Tolerance Patient tolerated treatment well    Behavior During Therapy WFL for tasks assessed/performed                 Past Medical History:  Diagnosis Date   Alcohol use    Anemia    Anxiety    Asthma    Basal cell carcinoma    forehead   Breast cancer ([place]) 1998   left    Colon polyps    GERD (gastroesophageal reflux disease)    Glaucoma    Hyperlipidemia    Hypertension    Hyponatremia    remote, mild   Mitral valve prolapse    Osteoarthritis    sees rheumatologist   Osteoporosis    sees rheumatologist   Personal history of radiation therapy 1998   PMR (polymyalgia rheumatica) ([place])    sees rheumatologist   Pneumonia    Prinzmetal angina ([place])    White matter changes    ? ischemic sm vs per pt   Past Surgical History:  Procedure Laterality Date   ABDOMINAL HYSTERECTOMY     BREAST LUMPECTOMY Left 1998   cancer   CESAREAN SECTION     x 4   COLONOSCOPY  07/2012   in [place]  2005   abdominal hernia repair   HYSTEROTOMY     LYMPH NODE DISSECTION Left    POLYPECTOMY     Patient Active Problem List   Diagnosis Date Noted   DDD (degenerative disc disease), cervical 06/22/2021   History of tamoxifen therapy 05/13/2020   Increased risk of breast cancer 05/13/2020   Degenerative cervical disc 01/01/2020   Vertigo 08/06/2019   Cough 08/06/2019   Mixed rhinitis 08/06/2019   Gastroesophageal reflux disease 08/06/2019   Vestibular disorder 04/09/2019   Bilateral hearing  loss 03/23/2018   ETD (Eustachian tube dysfunction), bilateral 03/23/2018   Facial numbness 03/23/2018   Age-related osteoporosis without current pathological fracture 01/15/2018   Breast pain, left 01/15/2018   Malignant neoplasm of lower-outer quadrant of left breast of female, estrogen receptor positive ([place]) 01/15/2018   Trigeminal neuropathy 11/13/2017   Pain in right hand 05/25/2017   PMR (polymyalgia rheumatica) ([place]) 04/24/2017   Hyponatremia 07/15/2015   Hyperlipidemia 07/15/2015   Esophageal spasm 07/15/2015   Essential hypertension 07/15/2015   Depression 07/15/2015   Asthma 07/15/2015   Tinnitus 07/15/2015   Osteoporosis 07/15/2015   Spasm    Prinzmetal angina ([place])    Mitral valve prolapse    White matter changes    Osteoarthritis 01/24/2013    PCP: [name], MD  REFERRING PROVIDER: Lyndal Pulley, DO  REFERRING DIAG: R26.89 (ICD-10-CM) - Balance disorder R27.9 (ICD-10-CM) - Coordination disorder  THERAPY DIAG:  Dizziness and giddiness  Unsteadiness on feet  Cervicalgia  ONSET DATE: 5 years ago  Rationale for Evaluation and Treatment: Rehabilitation  SUBJECTIVE:   SUBJECTIVE STATEMENT: Had COVID and believes that  she has long-term COVID d/t  constantly blowing nose, spitting up, problems with her ear, and decreased appetite. Has not seen PCP about this. Reports that she was in a great mood after last appointment, however feels like the symptoms have "slid back to where I was" however notes that N/T is a little better.   Pt accompanied by: self and significant other  PERTINENT HISTORY: Anemia, anxiety, asthma, L breast CA s/p lumpectomy, GERD, HLD, HTN, osteoporosis, polymyalgia rheumatica  PAIN:  Are you having pain? Yes: NPRS scale: 5/10 Pain location: B temples and head Pain description: numbness, heaviness Aggravating factors: constant Relieving factors: nothing  PRECAUTIONS: Fall and Other: osteoporosis   WEIGHT BEARING  RESTRICTIONS: No  FALLS: Has patient fallen in last 6 months? 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  PLOF: Independent  PATIENT GOALS: improved balance  OBJECTIVE:      TODAY'S TREATMENT: 01/18/22 Activity Comments  STM and manual TPR to cervical musculature, central grade II/III PAs, translational jt mobs grade II/III, gentle manual cervical traction with towel 3x30"   Soft tissue restriction in R>L scalenes, SCM, suboccipitals, cervical paraspinals; hypomobility evident throughtout C-spine with    Pt reports "head feels lighter" but remaining N/T over face   Cervical rotation SNAG 5x each side To tolerance; pt reported "I just don't feel good"   Vitals R arm 135/27mHg, 98% spO2, 79bpm   D2 flexion ball to cone 10x each side 3.5-5/10 dizziness   1/2 turns to targets + alt toe tap on cone 10x  Reported more difficulty/dizziness to L; frequent cues to find target upon turning and min-mod A for stability     PATIENT EDUCATION: Education details: edu on proper hydration during illness and encouraged patient to f/u with PCP if symptoms continue Person educated: Patient Education method: Explanation Education comprehension: verbalized understanding    HOME EXERCISE PROGRAM Last updated: 12/24/21 Access Code: [email or web address] N URL: [email or web address] Date: 12/24/2021 [facility]  Exercises - Seated Left Head Turns Vestibular Habituation  - 1 x daily - 5 x weekly - 2-3 sets - 30 sec hold - Seated Head Nods Vestibular Habituation  - 1 x daily - 5 x weekly - 2-3 sets - 30 sec hold - Narrow Stance with Counter Support  - 1 x daily - 5 x weekly - 2-3 sets - 30 sec hold - Mid-Lower Cervical Extension SNAG with Strap  - 1 x daily - 5 x weekly - 2 sets - 10 reps - Seated  Assisted Cervical Rotation with Towel  - 1 x daily - 5 x weekly - 2 sets - 10 reps - Cervical Retraction with Overpressure  - 1 x daily - 5 x weekly - 2 sets - 10 reps - 3 sec hold     Below measures were taken at time of initial evaluation unless otherwise specified:   DIAGNOSTIC FINDINGS: none recent  COGNITION: Overall cognitive status: Within functional limits for tasks assessed   SENSATION: Intact with light pressure over body and face  POSTURE:  rounded shoulders and forward head   LOWER EXTREMITY MMT:   MMT Right eval Left eval  Hip flexion    Hip abduction    Hip adduction    Hip internal rotation    Hip external rotation    Knee flexion    Knee  extension    Ankle dorsiflexion    Ankle plantarflexion    Ankle inversion    Ankle eversion    (Blank rows = not tested)  GAIT: Gait pattern:  short step length, slow, guarded gait Assistive device utilized: None Level of assistance: SBA  FUNCTIONAL TESTS:    M-CTSIB  Condition 1: Firm Surface, EO 30 Sec, Moderate Sway  Condition 2: Firm Surface, EC 3 Sec, Severe and LOB posteriorly  Sway  Condition 3: Foam Surface, EO NT d/t safety Sec,  -  Sway  Condition 4: Foam Surface, EC NT d/t safety Sec,  -  Sway     PATIENT SURVEYS:  FOTO 48  VESTIBULAR ASSESSMENT:  GENERAL OBSERVATION: pt wears readers     OCULOMOTOR EXAM:  Ocular Alignment: normal  Ocular ROM: No Limitations  Spontaneous Nystagmus: absent  Gaze-Induced Nystagmus: absent  Smooth Pursuits: intact and pt reports sensation of "cross eyed"  Saccades: intact and c/o some wooziness  Convergence/Divergence: "trouble seeing" at ~ 1 foot away; c/o dizziness    VESTIBULAR - OCULAR REFLEX:   Slow VOR: Comment: c/o dizziness, diplopia, "not balanced" ; head nods/turns guarded and discontinuous  VOR Cancellation: Comment: no sx; trouble coordinating movement   Head-Impulse Test: HIT Right: slight positive  HIT Left: negative      POSITIONAL  TESTING:  Right Roll Test: negative; c/o mild dizziness with movement, improved with holding position Left Roll Test: c/o mild dizziness with movement, improved with holding position Right Sidelying: negative; c/o very mild dizziness upon sitting up  Left Sidelying: negative; c/o very mild dizziness upon sitting up     VESTIBULAR TREATMENT:                                                                                                   DATE: 12/13/21   PATIENT EDUCATION: Education details: prognosis, POC, HEP, exam findings  Person educated: Patient and Spouse Education method: Explanation, Demonstration, Tactile cues, Verbal cues, and Handouts Education comprehension: verbalized understanding and returned demonstration  HOME EXERCISE PROGRAM: Access Code: [email or web address] N URL: [email or web address] Date: 12/13/2021 Prepared by: [facility]  Exercises - Seated Left Head Turns Vestibular Habituation  - 1 x daily - 5 x weekly - 2-3 sets - 30 sec hold - Seated Head Nods Vestibular Habituation  - 1 x daily - 5 x weekly - 2-3 sets - 30 sec hold - Narrow Stance with Counter Support  - 1 x daily - 5 x weekly - 2-3 sets - 30 sec hold   GOALS: Goals reviewed with patient? Yes  SHORT TERM GOALS: Target date: 01/03/2022  Patient to be independent with initial HEP. Baseline: HEP initiated Goal status: IN PROGRESS    LONG TERM GOALS: Target date: 02/07/2022  Patient to be independent with advanced HEP. Baseline: Not yet initiated  Goal status: IN PROGRESS  Patient to report 0/10 dizziness with standing vertical and horizontal VOR for 30 seconds. Baseline: Unable Goal status: IN PROGRESS  Patient will report 0/10 dizziness with bed mobility.  Baseline: Symptomatic  Goal status: IN  PROGRESS  Patient to demonstrate mild sway with M-CTSIB condition with eyes closed/FIRM surface for 30 sec in order to improve safety in environments with  uneven surfaces and dim lighting. Baseline: 3 sec before LOB Goal status: IN PROGRESS  Patient to score at least 20/24 on DGI in order to decrease risk of falls. Baseline: 13 Goal status: IN PROGRESS  Patient to demonstrate cervical AROM WFL and without pain limiting in order to improve ability to scan environment. Baseline:  Active ROM A/PROM (deg) eval  Flexion 25  Extension 18  Right lateral flexion 25  Left lateral flexion 21  Right rotation 37  Left rotation 51   (Blank rows = not tested) Goal status: IN PROGRESS  Patient to score at least 55 on FOTO in order to indicate improved functional outcomes.  Baseline: 48 Goal status: IN PROGRESS    ASSESSMENT:  CLINICAL IMPRESSION: Patient arrived to session after a few missed PT sessions d/t COVID; reports some remaining symptoms including congestion and poor appetite. Patient tolerated MT focusing on improving cervical soft tissue restriction and joint mobility. Patient reported marked improvement in head pressure sensation after MT. Proceeded with balance training with bending and turning activities which did bring on some dizziness and PT assist required to help recover balance with turns and SLS activities. Encouraged patient to f/u with PCP about post-COVID symptoms. No complaints at end of session.   OBJECTIVE IMPAIRMENTS: Abnormal gait, decreased balance, decreased ROM, dizziness, impaired flexibility, improper body mechanics, postural dysfunction, and pain.   ACTIVITY LIMITATIONS: carrying, lifting, bending, sitting, standing, squatting, sleeping, stairs, transfers, bed mobility, bathing, dressing, reach over head, and caring for others  PARTICIPATION LIMITATIONS: meal prep, cleaning, laundry, shopping, community activity, and church  PERSONAL FACTORS: Age, Fitness, Past/current experiences, Time since onset of injury/illness/exacerbation, and 3+ comorbidities: Anemia, anxiety, asthma, L breast CA s/p lumpectomy, GERD,  HLD, HTN, osteoporosis, polymyalgia rheumatica  are also affecting patient's functional outcome.   REHAB POTENTIAL: Good  CLINICAL DECISION MAKING: Evolving/moderate complexity  EVALUATION COMPLEXITY: Moderate   PLAN:  PT FREQUENCY: 2x/week  PT DURATION: 8 weeks  PLANNED INTERVENTIONS: Therapeutic exercises, Therapeutic activity, Neuromuscular re-education, Balance training, Gait training, Patient/Family education, Self Care, Joint mobilization, Stair training, Vestibular training, Canalith repositioning, DME instructions, Aquatic Therapy, Dry Needling, Electrical stimulation, Cryotherapy, Moist heat, Taping, Manual therapy, and Re-evaluation  PLAN FOR NEXT SESSION: progress HEP with cervical ROM, strengthening, stretching, reassess HEP, progress VOR and habituation    Janene Harvey, PT, DPT 01/18/22 9:25 AM   [facility] at [facility] [address] [place], [place] [address] Phone # [phone] Fax # [phone]

## 2022-01-18 ENCOUNTER — Encounter: Payer: Self-pay | Admitting: Physical Therapy

## 2022-01-18 ENCOUNTER — Telehealth: Payer: Self-pay | Admitting: Gastroenterology

## 2022-01-18 ENCOUNTER — Ambulatory Visit: Payer: Medicare Other | Admitting: Physical Therapy

## 2022-01-18 ENCOUNTER — Ambulatory Visit: Payer: Medicare Other | Admitting: Internal Medicine

## 2022-01-18 VITALS — BP 110/70 | HR 104 | Resp 18 | Ht <= 58 in | Wt 98.2 lb

## 2022-01-18 DIAGNOSIS — R2681 Unsteadiness on feet: Secondary | ICD-10-CM

## 2022-01-18 DIAGNOSIS — U099 Post covid-19 condition, unspecified: Secondary | ICD-10-CM | POA: Diagnosis not present

## 2022-01-18 DIAGNOSIS — R051 Acute cough: Secondary | ICD-10-CM

## 2022-01-18 DIAGNOSIS — M542 Cervicalgia: Secondary | ICD-10-CM

## 2022-01-18 DIAGNOSIS — R42 Dizziness and giddiness: Secondary | ICD-10-CM

## 2022-01-18 DIAGNOSIS — G9332 Myalgic encephalomyelitis/chronic fatigue syndrome: Secondary | ICD-10-CM | POA: Diagnosis not present

## 2022-01-18 NOTE — Telephone Encounter (Signed)
Spoke with patient & provided radiology scheduling number so that she could reschedule swallow study.

## 2022-01-18 NOTE — Telephone Encounter (Signed)
PT is calling to reschedule DG ESOPHAGUS W DOUBLE CM Please advise. Thank you

## 2022-01-18 NOTE — Progress Notes (Signed)
   Acute Office Visit  Subjective:     Patient ID: Amanda Cannon, female    DOB: Mar 03, 1945, 76 y.o.   MRN: [id number]  Chief Complaint  Patient presents with   URI    Cold symptoms    HPI Patient is in today for feeling tired, she has COVID infection more than 2 weeks ago. She still  Review of Systems  Constitutional:  Positive for malaise/fatigue.  HENT:  Positive for congestion.   Respiratory:  Positive for cough.   Cardiovascular: Negative.         Objective:    BP 110/70 (BP Location: Left Arm, Patient Position: Sitting, Cuff Size: Normal)   Pulse (!) 104   Resp 18   Ht 4\' 8"  (1.422 m)   Wt 98 lb 3 oz (44.5 kg)   SpO2 98%   BMI 22.01 kg/m    Physical Exam Constitutional:      Appearance: Normal appearance.  HENT:     Head: Normocephalic and atraumatic.  Cardiovascular:     Rate and Rhythm: Normal rate and regular rhythm.     Heart sounds: Normal heart sounds.  Pulmonary:     Effort: Pulmonary effort is normal.     Breath sounds: Normal breath sounds.  Neurological:     Mental Status: She is alert.     No results found for any visits on 01/18/22.      Assessment & Plan:   Problem List Items Addressed This Visit       Other   Cough    She is getting better so will monitor      Post-COVID chronic fatigue - Primary    She will gradually increase acticity and drink plenty of fluid, if not better then will re-evaluate.       No orders of the defined types were placed in this encounter.   No follow-ups on file.  Garwin Brothers, MD

## 2022-01-19 ENCOUNTER — Telehealth: Payer: Self-pay | Admitting: Physical Therapy

## 2022-01-19 NOTE — Telephone Encounter (Signed)
Patient called requesting call back. Spoke to patient who said she was seen by a doctor yesterday and "nothing was really wrong with be except for my blood pressure." Reports that she later got sick and experienced diarrhea and cancelled upcoming PT session as a result. Advised patient to return when she feels recovered from illness. Patient reported understanding.

## 2022-01-19 NOTE — Therapy (Incomplete)
OUTPATIENT PHYSICAL THERAPY VESTIBULAR TREATMENT     Patient Name: Amanda Cannon MRN: [id number] DOB:Jul 22, 1945, 76 y.o., female Today's Date: 01/18/2022  END OF SESSION:  PT End of Session - 01/18/22 0920     Visit Number 6    Number of Visits 17    Date for PT Re-Evaluation 02/07/22    Authorization Type Medicare/[employer]ARP    PT Start Time 0836    PT Stop Time 0918    PT Time Calculation (min) 42 min    Equipment Utilized During Treatment Gait belt    Activity Tolerance Patient tolerated treatment well    Behavior During Therapy WFL for tasks assessed/performed                 Past Medical History:  Diagnosis Date   Alcohol use    Anemia    Anxiety    Asthma    Basal cell carcinoma    forehead   Breast cancer ([place]) 1998   left    Colon polyps    GERD (gastroesophageal reflux disease)    Glaucoma    Hyperlipidemia    Hypertension    Hyponatremia    remote, mild   Mitral valve prolapse    Osteoarthritis    sees rheumatologist   Osteoporosis    sees rheumatologist   Personal history of radiation therapy 1998   PMR (polymyalgia rheumatica) ([place])    sees rheumatologist   Pneumonia    Prinzmetal angina ([place])    White matter changes    ? ischemic sm vs per pt   Past Surgical History:  Procedure Laterality Date   ABDOMINAL HYSTERECTOMY     BREAST LUMPECTOMY Left 1998   cancer   CESAREAN SECTION     x 4   COLONOSCOPY  07/2012   in [place]  2005   abdominal hernia repair   HYSTEROTOMY     LYMPH NODE DISSECTION Left    POLYPECTOMY     Patient Active Problem List   Diagnosis Date Noted   DDD (degenerative disc disease), cervical 06/22/2021   History of tamoxifen therapy 05/13/2020   Increased risk of breast cancer 05/13/2020   Degenerative cervical disc 01/01/2020   Vertigo 08/06/2019   Cough 08/06/2019   Mixed rhinitis 08/06/2019   Gastroesophageal reflux disease 08/06/2019   Vestibular disorder 04/09/2019   Bilateral hearing  loss 03/23/2018   ETD (Eustachian tube dysfunction), bilateral 03/23/2018   Facial numbness 03/23/2018   Age-related osteoporosis without current pathological fracture 01/15/2018   Breast pain, left 01/15/2018   Malignant neoplasm of lower-outer quadrant of left breast of female, estrogen receptor positive ([place]) 01/15/2018   Trigeminal neuropathy 11/13/2017   Pain in right hand 05/25/2017   PMR (polymyalgia rheumatica) ([place]) 04/24/2017   Hyponatremia 07/15/2015   Hyperlipidemia 07/15/2015   Esophageal spasm 07/15/2015   Essential hypertension 07/15/2015   Depression 07/15/2015   Asthma 07/15/2015   Tinnitus 07/15/2015   Osteoporosis 07/15/2015   Spasm    Prinzmetal angina ([place])    Mitral valve prolapse    White matter changes    Osteoarthritis 01/24/2013    PCP: [name], MD  REFERRING PROVIDER: Lyndal Pulley, DO  REFERRING DIAG: R26.89 (ICD-10-CM) - Balance disorder R27.9 (ICD-10-CM) - Coordination disorder  THERAPY DIAG:  Dizziness and giddiness  Unsteadiness on feet  Cervicalgia  ONSET DATE: 5 years ago  Rationale for Evaluation and Treatment: Rehabilitation  SUBJECTIVE:   SUBJECTIVE STATEMENT: Had COVID and believes that  she has long-term COVID d/t  constantly blowing nose, spitting up, problems with her ear, and decreased appetite. Has not seen PCP about this. Reports that she was in a great mood after last appointment, however feels like the symptoms have "slid back to where I was" however notes that N/T is a little better.   Pt accompanied by: self and significant other  PERTINENT HISTORY: Anemia, anxiety, asthma, L breast CA s/p lumpectomy, GERD, HLD, HTN, osteoporosis, polymyalgia rheumatica  PAIN:  Are you having pain? Yes: NPRS scale: 5/10 Pain location: B temples and head Pain description: numbness, heaviness Aggravating factors: constant Relieving factors: nothing  PRECAUTIONS: Fall and Other: osteoporosis   WEIGHT BEARING  RESTRICTIONS: No  FALLS: Has patient fallen in last 6 months? 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  PLOF: Independent  PATIENT GOALS: improved balance  OBJECTIVE:    TODAY'S TREATMENT: 01/20/22 Activity Comments                        HOME EXERCISE PROGRAM Last updated: 12/24/21 Access Code: [email or web address] URL: [email or web address] Date: 12/24/2021 Prepared by: [facility]  Exercises - Seated Left Head Turns Vestibular Habituation  - 1 x daily - 5 x weekly - 2-3 sets - 30 sec hold - Seated Head Nods Vestibular Habituation  - 1 x daily - 5 x weekly - 2-3 sets - 30 sec hold - Narrow Stance with Counter Support  - 1 x daily - 5 x weekly - 2-3 sets - 30 sec hold - Mid-Lower Cervical Extension SNAG with Strap  - 1 x daily - 5 x weekly - 2 sets - 10 reps - Seated Assisted Cervical Rotation with Towel  - 1 x daily - 5 x weekly - 2 sets - 10 reps - Cervical Retraction with Overpressure  - 1 x daily - 5 x weekly - 2 sets - 10 reps - 3 sec hold     Below measures were taken at time of initial evaluation unless otherwise specified:   DIAGNOSTIC FINDINGS: none recent  COGNITION: Overall cognitive status: Within functional limits for tasks assessed   SENSATION: Intact with light pressure over body and face  POSTURE:  rounded shoulders and forward head   LOWER EXTREMITY MMT:   MMT Right eval Left eval  Hip flexion    Hip abduction    Hip adduction    Hip internal rotation    Hip external rotation    Knee flexion    Knee extension    Ankle dorsiflexion    Ankle plantarflexion    Ankle inversion    Ankle eversion    (Blank rows = not tested)  GAIT: Gait pattern:  short step length, slow, guarded gait Assistive device utilized: None Level of assistance:  SBA  FUNCTIONAL TESTS:    M-CTSIB  Condition 1: Firm Surface, EO 30 Sec, Moderate Sway  Condition 2: Firm Surface, EC 3 Sec, Severe and LOB posteriorly  Sway  Condition 3: Foam Surface, EO NT d/t safety Sec,  -  Sway  Condition 4: Foam Surface, EC NT d/t safety Sec,  -  Sway     PATIENT SURVEYS:  FOTO 48  VESTIBULAR ASSESSMENT:  GENERAL OBSERVATION: pt wears readers     OCULOMOTOR EXAM:  Ocular Alignment: normal  Ocular ROM: No  Limitations  Spontaneous Nystagmus: absent  Gaze-Induced Nystagmus: absent  Smooth Pursuits: intact and pt reports sensation of "cross eyed"  Saccades: intact and c/o some wooziness  Convergence/Divergence: "trouble seeing" at ~ 1 foot away; c/o dizziness    VESTIBULAR - OCULAR REFLEX:   Slow VOR: Comment: c/o dizziness, diplopia, "not balanced" ; head nods/turns guarded and discontinuous  VOR Cancellation: Comment: no sx; trouble coordinating movement   Head-Impulse Test: HIT Right: slight positive  HIT Left: negative      POSITIONAL TESTING:  Right Roll Test: negative; c/o mild dizziness with movement, improved with holding position Left Roll Test: c/o mild dizziness with movement, improved with holding position Right Sidelying: negative; c/o very mild dizziness upon sitting up  Left Sidelying: negative; c/o very mild dizziness upon sitting up     VESTIBULAR TREATMENT:                                                                                                   DATE: 12/13/21   PATIENT EDUCATION: Education details: prognosis, POC, HEP, exam findings  Person educated: Patient and Spouse Education method: Explanation, Demonstration, Tactile cues, Verbal cues, and Handouts Education comprehension: verbalized understanding and returned demonstration  HOME EXERCISE PROGRAM: Access Code: [email or web address] URL: [email or web address] Date: 12/13/2021 Prepared by: [facility]  Exercises -  Seated Left Head Turns Vestibular Habituation  - 1 x daily - 5 x weekly - 2-3 sets - 30 sec hold - Seated Head Nods Vestibular Habituation  - 1 x daily - 5 x weekly - 2-3 sets - 30 sec hold - Narrow Stance with Counter Support  - 1 x daily - 5 x weekly - 2-3 sets - 30 sec hold   GOALS: Goals reviewed with patient? Yes  SHORT TERM GOALS: Target date: 01/03/2022  Patient to be independent with initial HEP. Baseline: HEP initiated Goal status: IN PROGRESS    LONG TERM GOALS: Target date: 02/07/2022  Patient to be independent with advanced HEP. Baseline: Not yet initiated  Goal status: IN PROGRESS  Patient to report 0/10 dizziness with standing vertical and horizontal VOR for 30 seconds. Baseline: Unable Goal status: IN PROGRESS  Patient will report 0/10 dizziness with bed mobility.  Baseline: Symptomatic  Goal status: IN PROGRESS  Patient to demonstrate mild sway with M-CTSIB condition with eyes closed/FIRM surface for 30 sec in order to improve safety in environments with uneven surfaces and dim lighting. Baseline: 3 sec before LOB Goal status: IN PROGRESS  Patient to score at least 20/24 on DGI in order to decrease risk of falls. Baseline: 13 Goal status: IN PROGRESS  Patient to demonstrate cervical AROM WFL and without pain limiting in order to improve ability to scan environment. Baseline:  Active ROM A/PROM (deg) eval  Flexion 25  Extension 18  Right lateral flexion 25  Left lateral flexion 21  Right rotation 37  Left rotation 51   (Blank rows = not tested) Goal status: IN PROGRESS  Patient to score at least 55 on FOTO in order to indicate improved functional outcomes.  Baseline: 48 Goal status: IN PROGRESS    ASSESSMENT:  CLINICAL IMPRESSION: Patient arrived to session after a few missed PT sessions d/t COVID; reports some remaining symptoms including congestion and poor appetite. Patient tolerated MT focusing on improving cervical soft tissue restriction  and joint mobility. Patient reported marked improvement in head pressure sensation after MT. Proceeded with balance training with bending and turning activities which did bring on some dizziness and PT assist required to help recover balance with turns and SLS activities. Encouraged patient to f/u with PCP about post-COVID symptoms. No complaints at end of session.   OBJECTIVE IMPAIRMENTS: Abnormal gait, decreased balance, decreased ROM, dizziness, impaired flexibility, improper body mechanics, postural dysfunction, and pain.   ACTIVITY LIMITATIONS: carrying, lifting, bending, sitting, standing, squatting, sleeping, stairs, transfers, bed mobility, bathing, dressing, reach over head, and caring for others  PARTICIPATION LIMITATIONS: meal prep, cleaning, laundry, shopping, community activity, and church  PERSONAL FACTORS: Age, Fitness, Past/current experiences, Time since onset of injury/illness/exacerbation, and 3+ comorbidities: Anemia, anxiety, asthma, L breast CA s/p lumpectomy, GERD, HLD, HTN, osteoporosis, polymyalgia rheumatica  are also affecting patient's functional outcome.   REHAB POTENTIAL: Good  CLINICAL DECISION MAKING: Evolving/moderate complexity  EVALUATION COMPLEXITY: Moderate   PLAN:  PT FREQUENCY: 2x/week  PT DURATION: 8 weeks  PLANNED INTERVENTIONS: Therapeutic exercises, Therapeutic activity, Neuromuscular re-education, Balance training, Gait training, Patient/Family education, Self Care, Joint mobilization, Stair training, Vestibular training, Canalith repositioning, DME instructions, Aquatic Therapy, Dry Needling, Electrical stimulation, Cryotherapy, Moist heat, Taping, Manual therapy, and Re-evaluation  PLAN FOR NEXT SESSION: progress HEP with cervical ROM, strengthening, stretching, reassess HEP, progress VOR and habituation    Janene Harvey, PT, DPT 01/18/22 9:25 AM  [facility] [address] [place], [place] [address] Phone # [phone] Fax # [phone]

## 2022-01-20 ENCOUNTER — Ambulatory Visit: Payer: Medicare Other | Admitting: Physical Therapy

## 2022-01-20 NOTE — Therapy (Incomplete)
OUTPATIENT PHYSICAL THERAPY VESTIBULAR TREATMENT     Patient Name: Amanda Cannon MRN: [id number] DOB:[birth year], 76 y.o., female Today's Date: 01/20/2022  END OF SESSION:        Past Medical History:  Diagnosis Date   Alcohol use    Anemia    Anxiety    Asthma    Basal cell carcinoma    forehead   Breast cancer ([place]) 1998   left    Colon polyps    GERD (gastroesophageal reflux disease)    Glaucoma    Hyperlipidemia    Hypertension    Hyponatremia    remote, mild   Mitral valve prolapse    Osteoarthritis    sees rheumatologist   Osteoporosis    sees rheumatologist   Personal history of radiation therapy 1998   PMR (polymyalgia rheumatica) ([place])    sees rheumatologist   Pneumonia    Prinzmetal angina ([place])    White matter changes    ? ischemic sm vs per pt   Past Surgical History:  Procedure Laterality Date   ABDOMINAL HYSTERECTOMY     BREAST LUMPECTOMY Left 1998   cancer   CESAREAN SECTION     x 4   COLONOSCOPY  07/2012   in [place]  2005   abdominal hernia repair   HYSTEROTOMY     LYMPH NODE DISSECTION Left    POLYPECTOMY     Patient Active Problem List   Diagnosis Date Noted   Vestibular disequilibrium 09/06/2021   DDD (degenerative disc disease), cervical 06/22/2021   History of tamoxifen therapy 05/13/2020   Increased risk of breast cancer 05/13/2020   Degenerative cervical disc 01/01/2020   Vertigo 08/06/2019   Cough 08/06/2019   Mixed rhinitis 08/06/2019   Gastroesophageal reflux disease 08/06/2019   Vestibular disorder 04/09/2019   Bilateral hearing loss 03/23/2018   ETD (Eustachian tube dysfunction), bilateral 03/23/2018   Facial numbness 03/23/2018   Age-related osteoporosis without current pathological fracture 01/15/2018   Breast pain, left 01/15/2018   Malignant neoplasm of lower-outer quadrant of left breast of female, estrogen receptor positive ([place]) 01/15/2018   Trigeminal neuropathy 11/13/2017   Pain in  right hand 05/25/2017   PMR (polymyalgia rheumatica) ([place]) 04/24/2017   Hyponatremia 07/15/2015   Hyperlipidemia 07/15/2015   Esophageal spasm 07/15/2015   Essential hypertension 07/15/2015   Depression 07/15/2015   Asthma 07/15/2015   Tinnitus 07/15/2015   Osteoporosis 07/15/2015   Spasm    Prinzmetal angina ([place])    Mitral valve prolapse    White matter changes    Osteoarthritis 01/24/2013    PCP: [name], MD  REFERRING PROVIDER: Lyndal Pulley, DO  REFERRING DIAG: R26.89 (ICD-10-CM) - Balance disorder R27.9 (ICD-10-CM) - Coordination disorder  THERAPY DIAG:  No diagnosis found.  ONSET DATE: 5 years ago  Rationale for Evaluation and Treatment: Rehabilitation  SUBJECTIVE:   SUBJECTIVE STATEMENT: Had COVID and believes that she has long-term COVID d/t  constantly blowing nose, spitting up, problems with her ear, and decreased appetite. Has not seen PCP about this. Reports that she was in a great mood after last appointment, however feels like the symptoms have "slid back to where I was" however notes that N/T is a little better.   Pt accompanied by: self and significant other  PERTINENT HISTORY: Anemia, anxiety, asthma, L breast CA s/p lumpectomy, GERD, HLD, HTN, osteoporosis, polymyalgia rheumatica  PAIN:  Are you having pain? Yes: NPRS scale: 5/10 Pain location: B temples and  head Pain description: numbness, heaviness Aggravating factors: constant Relieving factors: nothing  PRECAUTIONS: Fall and Other: osteoporosis   WEIGHT BEARING RESTRICTIONS: No  FALLS: Has patient fallen in last 6 months? 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  PLOF: Independent  PATIENT GOALS: improved balance  OBJECTIVE:    TODAY'S TREATMENT: 01/25/21 Activity Comments                        HOME EXERCISE  PROGRAM Last updated: 12/24/21 Access Code: [email or web address] URL: [email or web address] Date: 12/24/2021 Prepared by: [facility]  Exercises - Seated Left Head Turns Vestibular Habituation  - 1 x daily - 5 x weekly - 2-3 sets - 30 sec hold - Seated Head Nods Vestibular Habituation  - 1 x daily - 5 x weekly - 2-3 sets - 30 sec hold - Narrow Stance with Counter Support  - 1 x daily - 5 x weekly - 2-3 sets - 30 sec hold - Mid-Lower Cervical Extension SNAG with Strap  - 1 x daily - 5 x weekly - 2 sets - 10 reps - Seated Assisted Cervical Rotation with Towel  - 1 x daily - 5 x weekly - 2 sets - 10 reps - Cervical Retraction with Overpressure  - 1 x daily - 5 x weekly - 2 sets - 10 reps - 3 sec hold     Below measures were taken at time of initial evaluation unless otherwise specified:   DIAGNOSTIC FINDINGS: none recent  COGNITION: Overall cognitive status: Within functional limits for tasks assessed   SENSATION: Intact with light pressure over body and face  POSTURE:  rounded shoulders and forward head   LOWER EXTREMITY MMT:   MMT Right eval Left eval  Hip flexion    Hip abduction    Hip adduction    Hip internal rotation    Hip external rotation    Knee flexion    Knee extension    Ankle dorsiflexion    Ankle plantarflexion    Ankle inversion    Ankle eversion    (Blank rows = not tested)  GAIT: Gait pattern:  short step length, slow, guarded gait Assistive device utilized: None Level of assistance: SBA  FUNCTIONAL TESTS:    M-CTSIB  Condition 1: Firm Surface, EO 30 Sec, Moderate Sway  Condition 2: Firm Surface, EC 3 Sec, Severe and LOB posteriorly  Sway  Condition 3: Foam Surface, EO NT d/t safety Sec,  -  Sway  Condition 4: Foam Surface, EC NT d/t safety Sec,  -  Sway     PATIENT SURVEYS:  FOTO 48  VESTIBULAR ASSESSMENT:  GENERAL OBSERVATION: pt wears readers     OCULOMOTOR EXAM:  Ocular Alignment:  normal  Ocular ROM: No Limitations  Spontaneous Nystagmus: absent  Gaze-Induced Nystagmus: absent  Smooth Pursuits: intact and pt reports sensation of "cross eyed"  Saccades: intact and c/o some wooziness  Convergence/Divergence: "trouble seeing" at ~ 1 foot away; c/o dizziness    VESTIBULAR - OCULAR REFLEX:   Slow VOR: Comment: c/o dizziness, diplopia, "not balanced" ; head nods/turns guarded and discontinuous  VOR Cancellation: Comment: no sx; trouble coordinating movement   Head-Impulse Test: HIT Right: slight positive  HIT Left: negative      POSITIONAL TESTING:  Right Roll Test: negative; c/o mild dizziness  with movement, improved with holding position Left Roll Test: c/o mild dizziness with movement, improved with holding position Right Sidelying: negative; c/o very mild dizziness upon sitting up  Left Sidelying: negative; c/o very mild dizziness upon sitting up     VESTIBULAR TREATMENT:                                                                                                   DATE: 12/13/21   PATIENT EDUCATION: Education details: prognosis, POC, HEP, exam findings  Person educated: Patient and Spouse Education method: Explanation, Demonstration, Tactile cues, Verbal cues, and Handouts Education comprehension: verbalized understanding and returned demonstration  HOME EXERCISE PROGRAM: Access Code: [email or web address] N URL: [email or web address] Date: 12/13/2021 Prepared by: [facility]  Exercises - Seated Left Head Turns Vestibular Habituation  - 1 x daily - 5 x weekly - 2-3 sets - 30 sec hold - Seated Head Nods Vestibular Habituation  - 1 x daily - 5 x weekly - 2-3 sets - 30 sec hold - Narrow Stance with Counter Support  - 1 x daily - 5 x weekly - 2-3 sets - 30 sec hold   GOALS: Goals reviewed with patient? Yes  SHORT TERM GOALS: Target date: 01/03/2022  Patient to be independent with initial HEP. Baseline: HEP  initiated Goal status: IN PROGRESS    LONG TERM GOALS: Target date: 02/07/2022  Patient to be independent with advanced HEP. Baseline: Not yet initiated  Goal status: IN PROGRESS  Patient to report 0/10 dizziness with standing vertical and horizontal VOR for 30 seconds. Baseline: Unable Goal status: IN PROGRESS  Patient will report 0/10 dizziness with bed mobility.  Baseline: Symptomatic  Goal status: IN PROGRESS  Patient to demonstrate mild sway with M-CTSIB condition with eyes closed/FIRM surface for 30 sec in order to improve safety in environments with uneven surfaces and dim lighting. Baseline: 3 sec before LOB Goal status: IN PROGRESS  Patient to score at least 20/24 on DGI in order to decrease risk of falls. Baseline: 13 Goal status: IN PROGRESS  Patient to demonstrate cervical AROM WFL and without pain limiting in order to improve ability to scan environment. Baseline:  Active ROM A/PROM (deg) eval  Flexion 25  Extension 18  Right lateral flexion 25  Left lateral flexion 21  Right rotation 37  Left rotation 51   (Blank rows = not tested) Goal status: IN PROGRESS  Patient to score at least 55 on FOTO in order to indicate improved functional outcomes.  Baseline: 48 Goal status: IN PROGRESS    ASSESSMENT:  CLINICAL IMPRESSION: Patient arrived to session after a few missed PT sessions d/t COVID; reports some remaining symptoms including congestion and poor appetite. Patient tolerated MT focusing on improving cervical soft tissue restriction and joint mobility. Patient reported marked improvement in head pressure sensation after MT. Proceeded with balance training with bending and turning activities which did bring on some dizziness and PT assist required to help recover balance with turns and SLS activities. Encouraged patient to f/u with PCP about post-COVID symptoms. No complaints at  end of session.   OBJECTIVE IMPAIRMENTS: Abnormal gait, decreased balance,  decreased ROM, dizziness, impaired flexibility, improper body mechanics, postural dysfunction, and pain.   ACTIVITY LIMITATIONS: carrying, lifting, bending, sitting, standing, squatting, sleeping, stairs, transfers, bed mobility, bathing, dressing, reach over head, and caring for others  PARTICIPATION LIMITATIONS: meal prep, cleaning, laundry, shopping, community activity, and church  PERSONAL FACTORS: Age, Fitness, Past/current experiences, Time since onset of injury/illness/exacerbation, and 3+ comorbidities: Anemia, anxiety, asthma, L breast CA s/p lumpectomy, GERD, HLD, HTN, osteoporosis, polymyalgia rheumatica  are also affecting patient's functional outcome.   REHAB POTENTIAL: Good  CLINICAL DECISION MAKING: Evolving/moderate complexity  EVALUATION COMPLEXITY: Moderate   PLAN:  PT FREQUENCY: 2x/week  PT DURATION: 8 weeks  PLANNED INTERVENTIONS: Therapeutic exercises, Therapeutic activity, Neuromuscular re-education, Balance training, Gait training, Patient/Family education, Self Care, Joint mobilization, Stair training, Vestibular training, Canalith repositioning, DME instructions, Aquatic Therapy, Dry Needling, Electrical stimulation, Cryotherapy, Moist heat, Taping, Manual therapy, and Re-evaluation  PLAN FOR NEXT SESSION: progress HEP with cervical ROM, strengthening, stretching, reassess HEP, progress VOR and habituation    Janene Harvey, PT, DPT 01/20/22 4:41 PM  [facility] at [facility] [address] [place], [place] [address] Phone # [phone] Fax # [phone]

## 2022-01-25 ENCOUNTER — Ambulatory Visit: Payer: Medicare Other | Admitting: Physical Therapy

## 2022-01-25 ENCOUNTER — Telehealth: Payer: Self-pay | Admitting: Pharmacist

## 2022-01-25 DIAGNOSIS — H40053 Ocular hypertension, bilateral: Secondary | ICD-10-CM | POA: Diagnosis not present

## 2022-01-25 MED ORDER — REPATHA SURECLICK 140 MG/ML ~~LOC~~ SOAJ: 1.0000 | SUBCUTANEOUS | 11 refills | Status: DC

## 2022-01-25 NOTE — Therapy (Signed)
OUTPATIENT PHYSICAL THERAPY VESTIBULAR TREATMENT     Patient Name: Amanda Cannon MRN: [id number] DOB:09/16/1945, 76 y.o., female Today's Date: 01/27/2022  END OF SESSION:  PT End of Session - 01/27/22 0926     Visit Number 7    Number of Visits 17    Date for PT Re-Evaluation 02/07/22    Authorization Type Medicare/AARP    PT Start Time 0838    PT Stop Time 0921    PT Time Calculation (min) 43 min    Equipment Utilized During Treatment Gait belt    Activity Tolerance Patient tolerated treatment well    Behavior During Therapy WFL for tasks assessed/performed                  Past Medical History:  Diagnosis Date   Alcohol use    Anemia    Anxiety    Asthma    Basal cell carcinoma    forehead   Breast cancer ([facility]) 1998   left    Colon polyps    GERD (gastroesophageal reflux disease)    Glaucoma    Hyperlipidemia    Hypertension    Hyponatremia    remote, mild   Mitral valve prolapse    Osteoarthritis    sees rheumatologist   Osteoporosis    sees rheumatologist   Personal history of radiation therapy 1998   PMR (polymyalgia rheumatica) ([facility])    sees rheumatologist   Pneumonia    Prinzmetal angina ([facility])    White matter changes    ? ischemic sm vs per pt   Past Surgical History:  Procedure Laterality Date   ABDOMINAL HYSTERECTOMY     BREAST LUMPECTOMY Left 1998   cancer   CESAREAN SECTION     x 4   COLONOSCOPY  07/2012   in [place]   HERNIA REPAIR  2005   abdominal hernia repair   HYSTEROTOMY     LYMPH NODE DISSECTION Left    POLYPECTOMY     Patient Active Problem List   Diagnosis Date Noted   Vestibular disequilibrium 09/06/2021   DDD (degenerative disc disease), cervical 06/22/2021   History of tamoxifen therapy 05/13/2020   Increased risk of breast cancer 05/13/2020   Degenerative cervical disc 01/01/2020   Vertigo 08/06/2019   Cough 08/06/2019   Mixed rhinitis 08/06/2019   Gastroesophageal reflux disease 08/06/2019   Vestibular  disorder 04/09/2019   Bilateral hearing loss 03/23/2018   ETD (Eustachian tube dysfunction), bilateral 03/23/2018   Facial numbness 03/23/2018   Age-related osteoporosis without current pathological fracture 01/15/2018   Breast pain, left 01/15/2018   Malignant neoplasm of lower-outer quadrant of left breast of female, estrogen receptor positive ([facility]) 01/15/2018   Trigeminal neuropathy 11/13/2017   Pain in right hand 05/25/2017   PMR (polymyalgia rheumatica) ([facility]) 04/24/2017   Hyponatremia 07/15/2015   Hyperlipidemia 07/15/2015   Esophageal spasm 07/15/2015   Essential hypertension 07/15/2015   Depression 07/15/2015   Asthma 07/15/2015   Tinnitus 07/15/2015   Osteoporosis 07/15/2015   Spasm    Prinzmetal angina ([facility])    Mitral valve prolapse    White matter changes    Osteoarthritis 01/24/2013    PCP: Koberlein, Junell C, MD  REFERRING PROVIDER: Smith, Zachary M, DO  REFERRING DIAG: R26.89 (ICD-10-CM) - Balance disorder R27.9 (ICD-10-CM) - Coordination disorder  THERAPY DIAG:  Dizziness and giddiness  Unsteadiness on feet  Cervicalgia  ONSET DATE: 5 years ago  Rationale for Evaluation and Treatment: Rehabilitation  SUBJECTIVE:   SUBJECTIVE   STATEMENT: The L ear feels very clogged- this got worse since last session as she had to stay in bed sick. Feeling better now, even though her voice is still hoarse. Did some exercises but is not back to volleyball. Feels like before getting sick she was on the road to recovery, and still thinks she is.   Pt accompanied by: self and significant other  PERTINENT HISTORY: Anemia, anxiety, asthma, L breast CA s/p lumpectomy, GERD, HLD, HTN, osteoporosis, polymyalgia rheumatica  PAIN:  Are you having pain? Yes: NPRS scale: 5-6/10 Pain location: B temples and head Pain description: numbness, heaviness Aggravating factors: constant Relieving factors: nothing  PRECAUTIONS: Fall and Other: osteoporosis   WEIGHT BEARING  RESTRICTIONS: No  FALLS: Has patient fallen in last 6 months? 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  PLOF: Independent  PATIENT GOALS: improved balance  OBJECTIVE:    TODAY'S TREATMENT: 01/27/21 Activity Comments  cervical retraction 10x3" With 2 finger OP; good ROM  horizontal ABD yellow TB 2x10 Cues to maintain elbows straight and control eccentric phase   B shoulder ER yellow TB 2x10 Cues to control eccentric phase   placing cone down on floor, sidestepping over it 10x each side Report of wooziness and imbalance, report of better on 2nd trial; CGA-min A  fwd/back stepping + head nods to targets 10x each LE CGA-min A for imbalance; c/o increased head pressure and dizziness with looking overhead, up to 7/10  tandem walk fwd/back  CGA-min A; occasional imbalance   Gait + head turns/nods 2x19f C/o wobbly, woozy, head pressure with head nods > turns; CGA-min A    PATIENT EDUCATION: Education details: HEP update- to be performed at counter for safety; remainder of POC Person educated: Patient Education method: Explanation, Demonstration, Tactile cues, Verbal cues, and Handouts Education comprehension: verbalized understanding and returned demonstration    HOME EXERCISE PROGRAM Last updated: 01/27/21 Access Code: [email or web address] URL: [email or web address] Date: 01/27/2022 [facility]  Exercises - Seated Left Head Turns Vestibular Habituation  - 1 x daily - 5 x weekly - 2-3 sets - 30 sec hold - Seated Head Nods Vestibular Habituation  - 1 x daily - 5 x weekly - 2-3 sets - 30 sec hold - Narrow Stance with Counter Support  - 1 x daily - 5 x weekly - 2-3 sets - 30 sec hold - Mid-Lower Cervical Extension SNAG with Strap  - 1 x daily - 5 x weekly - 2 sets - 10 reps - Seated Assisted  Cervical Rotation with Towel  - 1 x daily - 5 x weekly - 2 sets - 10 reps - Cervical Retraction with Overpressure  - 1 x daily - 5 x weekly - 2 sets - 10 reps - 3 sec hold - Step Forward/back with Support  and head nods - 1 x daily - 5 x weekly - 2 sets - 10 reps     Below measures were taken at time of initial evaluation unless otherwise specified:   DIAGNOSTIC FINDINGS: none recent  COGNITION: Overall cognitive status: Within functional limits for tasks assessed   SENSATION: Intact with light pressure over body and face  POSTURE:  rounded shoulders and forward head   LOWER EXTREMITY MMT:   MMT Right eval Left eval  Hip flexion    Hip abduction  Hip adduction    Hip internal rotation    Hip external rotation    Knee flexion    Knee extension    Ankle dorsiflexion    Ankle plantarflexion    Ankle inversion    Ankle eversion    (Blank rows = not tested)  GAIT: Gait pattern:  short step length, slow, guarded gait Assistive device utilized: None Level of assistance: SBA  FUNCTIONAL TESTS:    M-CTSIB  Condition 1: Firm Surface, EO 30 Sec, Moderate Sway  Condition 2: Firm Surface, EC 3 Sec, Severe and LOB posteriorly  Sway  Condition 3: Foam Surface, EO NT d/t safety Sec,  -  Sway  Condition 4: Foam Surface, EC NT d/t safety Sec,  -  Sway     PATIENT SURVEYS:  FOTO 48  VESTIBULAR ASSESSMENT:  GENERAL OBSERVATION: pt wears readers     OCULOMOTOR EXAM:  Ocular Alignment: normal  Ocular ROM: No Limitations  Spontaneous Nystagmus: absent  Gaze-Induced Nystagmus: absent  Smooth Pursuits: intact and pt reports sensation of "cross eyed"  Saccades: intact and c/o some wooziness  Convergence/Divergence: "trouble seeing" at ~ 1 foot away; c/o dizziness    VESTIBULAR - OCULAR REFLEX:   Slow VOR: Comment: c/o dizziness, diplopia, "not balanced" ; head nods/turns guarded and discontinuous  VOR Cancellation: Comment: no sx; trouble coordinating movement    Head-Impulse Test: HIT Right: slight positive  HIT Left: negative      POSITIONAL TESTING:  Right Roll Test: negative; c/o mild dizziness with movement, improved with holding position Left Roll Test: c/o mild dizziness with movement, improved with holding position Right Sidelying: negative; c/o very mild dizziness upon sitting up  Left Sidelying: negative; c/o very mild dizziness upon sitting up     VESTIBULAR TREATMENT:                                                                                                   DATE: 12/13/21   PATIENT EDUCATION: Education details: prognosis, POC, HEP, exam findings  Person educated: Patient and Spouse Education method: Explanation, Demonstration, Tactile cues, Verbal cues, and Handouts Education comprehension: verbalized understanding and returned demonstration  HOME EXERCISE PROGRAM: Access Code: [email or web address] URL: [email or web address] Date: 12/13/2021 Prepared by: [facility]  Exercises - Seated Left Head Turns Vestibular Habituation  - 1 x daily - 5 x weekly - 2-3 sets - 30 sec hold - Seated Head Nods Vestibular Habituation  - 1 x daily - 5 x weekly - 2-3 sets - 30 sec hold - Narrow Stance with Counter Support  - 1 x daily - 5 x weekly - 2-3 sets - 30 sec hold   GOALS: Goals reviewed with patient? Yes  SHORT TERM GOALS: Target date: 01/03/2022  Patient to be independent with initial HEP. Baseline: HEP initiated Goal status: MET    LONG TERM GOALS: Target date: 02/07/2022  Patient to be independent with advanced HEP. Baseline: Not yet initiated  Goal status: IN PROGRESS  Patient to report 0/10 dizziness with standing vertical and horizontal VOR for 30 seconds.  Baseline: Unable Goal status: IN PROGRESS  Patient will report 0/10 dizziness with bed mobility.  Baseline: Symptomatic  Goal status: IN PROGRESS  Patient to demonstrate mild sway with M-CTSIB condition with  eyes closed/FIRM surface for 30 sec in order to improve safety in environments with uneven surfaces and dim lighting. Baseline: 3 sec before LOB Goal status: IN PROGRESS  Patient to score at least 20/24 on DGI in order to decrease risk of falls. Baseline: 13 Goal status: IN PROGRESS  Patient to demonstrate cervical AROM WFL and without pain limiting in order to improve ability to scan environment. Baseline:  Active ROM A/PROM (deg) eval  Flexion 25  Extension 18  Right lateral flexion 25  Left lateral flexion 21  Right rotation 37  Left rotation 51   (Blank rows = not tested) Goal status: IN PROGRESS  Patient to score at least 55 on FOTO in order to indicate improved functional outcomes.  Baseline: 48 Goal status: IN PROGRESS    ASSESSMENT:  CLINICAL IMPRESSION: Patient arrived to session with report of recovering from illness since last session. Notes worsened L sided head and ear pressure since being sick. However, reports that vestibular therapy is helping her symptoms. Patient performed periscapular strengthening activities with cueing for max eccentric control. Balance activities revealed good improvement in stability and tolerance for head motions with gait; particularly when comparing to initial eval. Patient reported most significant increase in head pressure with cervical extension. Updated HEP with activity which was well-tolerated today and with edu on safety. Patient reported understanding of all edu provided and without complaints at end of session.    OBJECTIVE IMPAIRMENTS: Abnormal gait, decreased balance, decreased ROM, dizziness, impaired flexibility, improper body mechanics, postural dysfunction, and pain.   ACTIVITY LIMITATIONS: carrying, lifting, bending, sitting, standing, squatting, sleeping, stairs, transfers, bed mobility, bathing, dressing, reach over head, and caring for others  PARTICIPATION LIMITATIONS: meal prep, cleaning, laundry, shopping, community  activity, and church  PERSONAL FACTORS: Age, Fitness, Past/current experiences, Time since onset of injury/illness/exacerbation, and 3+ comorbidities: Anemia, anxiety, asthma, L breast CA s/p lumpectomy, GERD, HLD, HTN, osteoporosis, polymyalgia rheumatica  are also affecting patient's functional outcome.   REHAB POTENTIAL: Good  CLINICAL DECISION MAKING: Evolving/moderate complexity  EVALUATION COMPLEXITY: Moderate   PLAN:  PT FREQUENCY: 2x/week  PT DURATION: 8 weeks  PLANNED INTERVENTIONS: Therapeutic exercises, Therapeutic activity, Neuromuscular re-education, Balance training, Gait training, Patient/Family education, Self Care, Joint mobilization, Stair training, Vestibular training, Canalith repositioning, DME instructions, Aquatic Therapy, Dry Needling, Electrical stimulation, Cryotherapy, Moist heat, Taping, Manual therapy, and Re-evaluation  PLAN FOR NEXT SESSION: work on habituating head extension motions; progress HEP with cervical ROM, strengthening, stretching, reassess HEP, progress VOR and habituation    Yevgeniya Kovalenko, PT, DPT 01/27/22 9:29 AM  [facility] at [facility] [address], [address] [place], [place] [address] Phone # [phone] Fax # [phone] 

## 2022-01-25 NOTE — Telephone Encounter (Signed)
Pt's formulary changing from [place] to [employer] for 2024, prior auth submitted and approved through 01/25/23. Refill sent in, left message for pt. She may need to tell the pharmacy to run her [place] with the [place] rx.

## 2022-01-26 ENCOUNTER — Encounter: Payer: Self-pay | Admitting: Pharmacist

## 2022-01-26 NOTE — Telephone Encounter (Signed)
Patient called stating pharmacy said grant isnt working. Spoke with pharmacist and checked [employer] portal. Amanda Cannon had expired. I renewed grant and gave info to pharmacy.   CARD NO. [id number]   CARD STATUS Active   BIN [id number]   PCN [id number]   PC GROUP [id number]

## 2022-01-27 ENCOUNTER — Encounter: Payer: Self-pay | Admitting: Physical Therapy

## 2022-01-27 ENCOUNTER — Ambulatory Visit: Payer: Medicare Other | Attending: Family Medicine | Admitting: Physical Therapy

## 2022-01-27 DIAGNOSIS — M542 Cervicalgia: Secondary | ICD-10-CM | POA: Diagnosis not present

## 2022-01-27 DIAGNOSIS — R2681 Unsteadiness on feet: Secondary | ICD-10-CM | POA: Insufficient documentation

## 2022-01-27 DIAGNOSIS — R42 Dizziness and giddiness: Secondary | ICD-10-CM | POA: Insufficient documentation

## 2022-01-31 NOTE — Therapy (Signed)
OUTPATIENT PHYSICAL THERAPY VESTIBULAR TREATMENT     Patient Name: Amanda Cannon MRN: [id number] DOB:[birth year], 77 y.o., female Today's Date: 02/01/2022  END OF SESSION:  PT End of Session - 02/01/22 0932     Visit Number 8    Number of Visits 17    Date for PT Re-Evaluation 02/07/22    Authorization Type Medicare/[employer]    PT Start Time 0845    PT Stop Time 0929    PT Time Calculation (min) 44 min    Equipment Utilized During Treatment Gait belt    Activity Tolerance Patient tolerated treatment well    Behavior During Therapy WFL for tasks assessed/performed                   Past Medical History:  Diagnosis Date   Alcohol use    Anemia    Anxiety    Asthma    Basal cell carcinoma    forehead   Breast cancer ([place]) 1998   left    Colon polyps    GERD (gastroesophageal reflux disease)    Glaucoma    Hyperlipidemia    Hypertension    Hyponatremia    remote, mild   Mitral valve prolapse    Osteoarthritis    sees rheumatologist   Osteoporosis    sees rheumatologist   Personal history of radiation therapy 1998   PMR (polymyalgia rheumatica) ([place])    sees rheumatologist   Pneumonia    Prinzmetal angina ([place])    White matter changes    ? ischemic sm vs per pt   Past Surgical History:  Procedure Laterality Date   ABDOMINAL HYSTERECTOMY     BREAST LUMPECTOMY Left 1998   cancer   CESAREAN SECTION     x 4   COLONOSCOPY  07/2012   in [place]  2005   abdominal hernia repair   HYSTEROTOMY     LYMPH NODE DISSECTION Left    POLYPECTOMY     Patient Active Problem List   Diagnosis Date Noted   Vestibular disequilibrium 09/06/2021   DDD (degenerative disc disease), cervical 06/22/2021   History of tamoxifen therapy 05/13/2020   Increased risk of breast cancer 05/13/2020   Degenerative cervical disc 01/01/2020   Vertigo 08/06/2019   Cough 08/06/2019   Mixed rhinitis 08/06/2019   Gastroesophageal reflux disease 08/06/2019    Vestibular disorder 04/09/2019   Bilateral hearing loss 03/23/2018   ETD (Eustachian tube dysfunction), bilateral 03/23/2018   Facial numbness 03/23/2018   Age-related osteoporosis without current pathological fracture 01/15/2018   Breast pain, left 01/15/2018   Malignant neoplasm of lower-outer quadrant of left breast of female, estrogen receptor positive ([place]) 01/15/2018   Trigeminal neuropathy 11/13/2017   Pain in right hand 05/25/2017   PMR (polymyalgia rheumatica) ([place]) 04/24/2017   Hyponatremia 07/15/2015   Hyperlipidemia 07/15/2015   Esophageal spasm 07/15/2015   Essential hypertension 07/15/2015   Depression 07/15/2015   Asthma 07/15/2015   Tinnitus 07/15/2015   Osteoporosis 07/15/2015   Spasm    Prinzmetal angina ([place])    Mitral valve prolapse    White matter changes    Osteoarthritis 01/24/2013    PCP: [name], MD  REFERRING PROVIDER: Lyndal Pulley, DO  REFERRING DIAG: R26.89 (ICD-10-CM) - Balance disorder R27.9 (ICD-10-CM) - Coordination disorder  THERAPY DIAG:  Dizziness and giddiness  Unsteadiness on feet  Cervicalgia  ONSET DATE: 5 years ago  Rationale for Evaluation and Treatment: Rehabilitation  SUBJECTIVE:  SUBJECTIVE STATEMENT: Husband had his surgery- got a fistula put in. "My ear feels like it's blocked." Reports that she was not able to do tai chi with the lights off.    Pt accompanied by: self  PERTINENT HISTORY: Anemia, anxiety, asthma, L breast CA s/p lumpectomy, GERD, HLD, HTN, osteoporosis, polymyalgia rheumatica  PAIN:  Are you having pain? Yes: NPRS scale: 4/10 Pain location: B temples and head Pain description: numbness, heaviness Aggravating factors: constant Relieving factors: nothing  PRECAUTIONS: Fall and Other: osteoporosis   WEIGHT BEARING RESTRICTIONS: No  FALLS: Has patient fallen in last 6 months? No  LIVING ENVIRONMENT: Lives with: lives with their spouse Lives in: Other senior living-  independent living  Stairs: No Has following equipment at home: Electronics engineer and Grab bars  PLOF: Independent  PATIENT GOALS: improved balance  OBJECTIVE:     TODAY'S TREATMENT: 02/01/22 Activity Comments  standing head nods to targets 2x30" Reports "my eyes feel weird"; mild posterior sway; 3/10 dizziness   standing vertical VOR 2x30" 6-7/10 dizziness, improved with slower speed (5/10 dizziness)  fwd/back stepping + head nods to targets 10x each  3-4/10 dizziness; cueing to increase step length backwards; 1 UE support and CGA  placing cones on overhead shelf from low stool 9x each side 1 UE support on counter; c/o mild wooziness; c/o delayed R head pressure   Romberg EC 30", feet slightly further apart 30" C/o anxiety in romberg, improved with feet further apart   Walking + head turns/nods 4x CGA; more unsteadiness with head nods           HOME EXERCISE PROGRAM Last updated: 02/01/21 Access Code: [email or web address] N URL: [email or web address] Date: 02/01/2022 Prepared by: [facility]  Program Notes perform standing in a corner/near a counter for safety  Exercises - Mid-Lower Cervical Extension SNAG with Strap  - 1 x daily - 5 x weekly - 2 sets - 10 reps - Seated Assisted Cervical Rotation with Towel  - 1 x daily - 5 x weekly - 2 sets - 10 reps - Cervical Retraction with Overpressure  - 1 x daily - 5 x weekly - 2 sets - 10 reps - 3 sec hold - Alternating Step Forward with Support  - 1 x daily - 5 x weekly - 2 sets - 10 reps - Standing Gaze Stabilization with Head Nod  - 1 x daily - 5 x weekly - 3 sets - 30 sec hold - Standing Balance with Eyes Closed  - 1 x daily - 5 x weekly - 2-3 sets - 30 sec hold   PATIENT EDUCATION: Education details: HEP update- to be performed at counter for safety  Person educated: Patient Education method: Explanation, Demonstration, Tactile cues, Verbal cues, and Handouts Education comprehension:  verbalized understanding and returned demonstration    Below measures were taken at time of initial evaluation unless otherwise specified:   DIAGNOSTIC FINDINGS: none recent  COGNITION: Overall cognitive status: Within functional limits for tasks assessed   SENSATION: Intact with light pressure over body and face  POSTURE:  rounded shoulders and forward head   LOWER EXTREMITY MMT:   MMT Right eval Left eval  Hip flexion    Hip abduction    Hip adduction    Hip internal rotation    Hip external rotation    Knee flexion    Knee extension    Ankle dorsiflexion    Ankle plantarflexion    Ankle inversion  Ankle eversion    (Blank rows = not tested)  GAIT: Gait pattern:  short step length, slow, guarded gait Assistive device utilized: None Level of assistance: SBA  FUNCTIONAL TESTS:    M-CTSIB  Condition 1: Firm Surface, EO 30 Sec, Moderate Sway  Condition 2: Firm Surface, EC 3 Sec, Severe and LOB posteriorly  Sway  Condition 3: Foam Surface, EO NT d/t safety Sec,  -  Sway  Condition 4: Foam Surface, EC NT d/t safety Sec,  -  Sway     PATIENT SURVEYS:  FOTO 48  VESTIBULAR ASSESSMENT:  GENERAL OBSERVATION: pt wears readers     OCULOMOTOR EXAM:  Ocular Alignment: normal  Ocular ROM: No Limitations  Spontaneous Nystagmus: absent  Gaze-Induced Nystagmus: absent  Smooth Pursuits: intact and pt reports sensation of "cross eyed"  Saccades: intact and c/o some wooziness  Convergence/Divergence: "trouble seeing" at ~ 1 foot away; c/o dizziness    VESTIBULAR - OCULAR REFLEX:   Slow VOR: Comment: c/o dizziness, diplopia, "not balanced" ; head nods/turns guarded and discontinuous  VOR Cancellation: Comment: no sx; trouble coordinating movement   Head-Impulse Test: HIT Right: slight positive  HIT Left: negative      POSITIONAL TESTING:  Right Roll Test: negative; c/o mild dizziness with movement, improved with holding position Left Roll Test: c/o mild  dizziness with movement, improved with holding position Right Sidelying: negative; c/o very mild dizziness upon sitting up  Left Sidelying: negative; c/o very mild dizziness upon sitting up     VESTIBULAR TREATMENT:                                                                                                   DATE: 12/13/21   PATIENT EDUCATION: Education details: prognosis, POC, HEP, exam findings  Person educated: Patient and Spouse Education method: Explanation, Demonstration, Tactile cues, Verbal cues, and Handouts Education comprehension: verbalized understanding and returned demonstration  HOME EXERCISE PROGRAM: Access Code: [email or web address] N URL: [email or web address] Date: 12/13/2021 Prepared by: [facility]  Exercises - Seated Left Head Turns Vestibular Habituation  - 1 x daily - 5 x weekly - 2-3 sets - 30 sec hold - Seated Head Nods Vestibular Habituation  - 1 x daily - 5 x weekly - 2-3 sets - 30 sec hold - Narrow Stance with Counter Support  - 1 x daily - 5 x weekly - 2-3 sets - 30 sec hold   GOALS: Goals reviewed with patient? Yes  SHORT TERM GOALS: Target date: 01/03/2022  Patient to be independent with initial HEP. Baseline: HEP initiated Goal status: MET    LONG TERM GOALS: Target date: 02/07/2022  Patient to be independent with advanced HEP. Baseline: Not yet initiated  Goal status: IN PROGRESS  Patient to report 0/10 dizziness with standing vertical and horizontal VOR for 30 seconds. Baseline: Unable Goal status: IN PROGRESS  Patient will report 0/10 dizziness with bed mobility.  Baseline: Symptomatic  Goal status: IN PROGRESS  Patient to demonstrate mild sway with M-CTSIB condition with eyes closed/FIRM surface for 30 sec in order to  improve safety in environments with uneven surfaces and dim lighting. Baseline: 3 sec before LOB Goal status: IN PROGRESS  Patient to score at least 20/24 on DGI in  order to decrease risk of falls. Baseline: 13 Goal status: IN PROGRESS  Patient to demonstrate cervical AROM WFL and without pain limiting in order to improve ability to scan environment. Baseline:  Active ROM A/PROM (deg) eval  Flexion 25  Extension 18  Right lateral flexion 25  Left lateral flexion 21  Right rotation 37  Left rotation 51   (Blank rows = not tested) Goal status: IN PROGRESS  Patient to score at least 55 on FOTO in order to indicate improved functional outcomes.  Baseline: 48 Goal status: IN PROGRESS    ASSESSMENT:  CLINICAL IMPRESSION: Patient arrived to session with continued sensation of L aural fullness. Mentions event when lights were turned off during tai-chi, which resulted in difficulty completing her routine. Session today focused on habituating head nods and bending activities as this was highlighted as a significant impairments last session. Patient required occasional cueing to decrease pace or otherwise correct form for improved tolerance/execution. Patient limited by anxiety to complete Romberg EC activity- better with feet slightly apart. Patient reported understanding of all edu provided and without complaints at end of session.     OBJECTIVE IMPAIRMENTS: Abnormal gait, decreased balance, decreased ROM, dizziness, impaired flexibility, improper body mechanics, postural dysfunction, and pain.   ACTIVITY LIMITATIONS: carrying, lifting, bending, sitting, standing, squatting, sleeping, stairs, transfers, bed mobility, bathing, dressing, reach over head, and caring for others  PARTICIPATION LIMITATIONS: meal prep, cleaning, laundry, shopping, community activity, and church  PERSONAL FACTORS: Age, Fitness, Past/current experiences, Time since onset of injury/illness/exacerbation, and 3+ comorbidities: Anemia, anxiety, asthma, L breast CA s/p lumpectomy, GERD, HLD, HTN, osteoporosis, polymyalgia rheumatica  are also affecting patient's functional outcome.    REHAB POTENTIAL: Good  CLINICAL DECISION MAKING: Evolving/moderate complexity  EVALUATION COMPLEXITY: Moderate   PLAN:  PT FREQUENCY: 2x/week  PT DURATION: 8 weeks  PLANNED INTERVENTIONS: Therapeutic exercises, Therapeutic activity, Neuromuscular re-education, Balance training, Gait training, Patient/Family education, Self Care, Joint mobilization, Stair training, Vestibular training, Canalith repositioning, DME instructions, Aquatic Therapy, Dry Needling, Electrical stimulation, Cryotherapy, Moist heat, Taping, Manual therapy, and Re-evaluation  PLAN FOR NEXT SESSION: work on habituating head extension motions; progress HEP with cervical ROM, strengthening, stretching, reassess HEP, progress VOR and habituation    Janene Harvey, PT, DPT 02/01/22 9:36 AM  [facility] at [facility] [address], [place], [place] [address] Phone # [phone] Fax # [phone]

## 2022-02-01 ENCOUNTER — Ambulatory Visit: Payer: Medicare Other | Admitting: Physical Therapy

## 2022-02-01 ENCOUNTER — Encounter: Payer: Self-pay | Admitting: Physical Therapy

## 2022-02-01 DIAGNOSIS — R2681 Unsteadiness on feet: Secondary | ICD-10-CM

## 2022-02-01 DIAGNOSIS — M542 Cervicalgia: Secondary | ICD-10-CM

## 2022-02-01 DIAGNOSIS — R42 Dizziness and giddiness: Secondary | ICD-10-CM

## 2022-02-01 NOTE — Therapy (Signed)
OUTPATIENT PHYSICAL THERAPY VESTIBULAR PROGRESS NOTE     Patient Name: Amanda Cannon MRN: [id number] DOB:09-30-1945, 77 y.o., female Today's Date: 02/01/2022   Progress Note Reporting Period 12/13/21 to 02/03/22  See note below for Objective Data and Assessment of Progress/Goals.      END OF SESSION:  PT End of Session - 02/01/22 0932     Visit Number 8    Number of Visits 17    Date for PT Re-Evaluation 02/07/22    Authorization Type Medicare/[employer]    PT Start Time 0845    PT Stop Time 0929    PT Time Calculation (min) 44 min    Equipment Utilized During Treatment Gait belt    Activity Tolerance Patient tolerated treatment well    Behavior During Therapy WFL for tasks assessed/performed                   Past Medical History:  Diagnosis Date   Alcohol use    Anemia    Anxiety    Asthma    Basal cell carcinoma    forehead   Breast cancer ([place]) 1998   left    Colon polyps    GERD (gastroesophageal reflux disease)    Glaucoma    Hyperlipidemia    Hypertension    Hyponatremia    remote, mild   Mitral valve prolapse    Osteoarthritis    sees rheumatologist   Osteoporosis    sees rheumatologist   Personal history of radiation therapy 1998   PMR (polymyalgia rheumatica) ([place])    sees rheumatologist   Pneumonia    Prinzmetal angina ([place])    White matter changes    ? ischemic sm vs per pt   Past Surgical History:  Procedure Laterality Date   ABDOMINAL HYSTERECTOMY     BREAST LUMPECTOMY Left 1998   cancer   CESAREAN SECTION     x 4   COLONOSCOPY  07/2012   in [place]  2005   abdominal hernia repair   HYSTEROTOMY     LYMPH NODE DISSECTION Left    POLYPECTOMY     Patient Active Problem List   Diagnosis Date Noted   Vestibular disequilibrium 09/06/2021   DDD (degenerative disc disease), cervical 06/22/2021   History of tamoxifen therapy 05/13/2020   Increased risk of breast cancer 05/13/2020   Degenerative cervical disc  01/01/2020   Vertigo 08/06/2019   Cough 08/06/2019   Mixed rhinitis 08/06/2019   Gastroesophageal reflux disease 08/06/2019   Vestibular disorder 04/09/2019   Bilateral hearing loss 03/23/2018   ETD (Eustachian tube dysfunction), bilateral 03/23/2018   Facial numbness 03/23/2018   Age-related osteoporosis without current pathological fracture 01/15/2018   Breast pain, left 01/15/2018   Malignant neoplasm of lower-outer quadrant of left breast of female, estrogen receptor positive ([place]) 01/15/2018   Trigeminal neuropathy 11/13/2017   Pain in right hand 05/25/2017   PMR (polymyalgia rheumatica) ([place]) 04/24/2017   Hyponatremia 07/15/2015   Hyperlipidemia 07/15/2015   Esophageal spasm 07/15/2015   Essential hypertension 07/15/2015   Depression 07/15/2015   Asthma 07/15/2015   Tinnitus 07/15/2015   Osteoporosis 07/15/2015   Spasm    Prinzmetal angina ([place])    Mitral valve prolapse    White matter changes    Osteoarthritis 01/24/2013    PCP: [name], MD  REFERRING PROVIDER: Lyndal Pulley, DO  REFERRING DIAG: R26.89 (ICD-10-CM) - Balance disorder R27.9 (ICD-10-CM) - Coordination disorder  THERAPY DIAG:  Dizziness  and giddiness  Unsteadiness on feet  Cervicalgia  ONSET DATE: 5 years ago  Rationale for Evaluation and Treatment: Rehabilitation  SUBJECTIVE:   SUBJECTIVE STATEMENT: Husband had his surgery- got a fistula put in. "My ear feels like it's blocked." Reports that she was not able to do tai chi with the lights off.    Pt accompanied by: self  PERTINENT HISTORY: Anemia, anxiety, asthma, L breast CA s/p lumpectomy, GERD, HLD, HTN, osteoporosis, polymyalgia rheumatica  PAIN:  Are you having pain? Yes: NPRS scale: 4/10 Pain location: B temples and head Pain description: numbness, heaviness Aggravating factors: constant Relieving factors: nothing  PRECAUTIONS: Fall and Other: osteoporosis   WEIGHT BEARING RESTRICTIONS: No  FALLS: Has  patient fallen in last 6 months? 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  PLOF: Independent  PATIENT GOALS: improved balance  OBJECTIVE:    TODAY'S TREATMENT: 02/03/22 Activity Comments                     Active ROM A/PROM (deg) eval AROM (deg) 02/03/22  Flexion 25   Extension 18   Right lateral flexion 25   Left lateral flexion 21   Right rotation 37   Left rotation 51    (Blank rows = not tested)   HOME EXERCISE PROGRAM Last updated: 02/01/21 Access Code: [email or web address] URL: [email or web address] Date: 02/01/2022 Prepared by: [facility]  Program Notes perform standing in a corner/near a counter for safety  Exercises - Mid-Lower Cervical Extension SNAG with Strap  - 1 x daily - 5 x weekly - 2 sets - 10 reps - Seated Assisted Cervical Rotation with Towel  - 1 x daily - 5 x weekly - 2 sets - 10 reps - Cervical Retraction with Overpressure  - 1 x daily - 5 x weekly - 2 sets - 10 reps - 3 sec hold - Alternating Step Forward with Support  - 1 x daily - 5 x weekly - 2 sets - 10 reps - Standing Gaze Stabilization with Head Nod  - 1 x daily - 5 x weekly - 3 sets - 30 sec hold - Standing Balance with Eyes Closed  - 1 x daily - 5 x weekly - 2-3 sets - 30 sec hold    Below measures were taken at time of initial evaluation unless otherwise specified:   DIAGNOSTIC FINDINGS: none recent  COGNITION: Overall cognitive status: Within functional limits for tasks assessed   SENSATION: Intact with light pressure over body and face  POSTURE:  rounded shoulders and forward head   LOWER EXTREMITY MMT:   MMT Right eval Left eval  Hip flexion    Hip abduction    Hip adduction    Hip internal rotation    Hip external rotation    Knee flexion    Knee extension    Ankle  dorsiflexion    Ankle plantarflexion    Ankle inversion    Ankle eversion    (Blank rows = not tested)  GAIT: Gait pattern:  short step length, slow, guarded gait Assistive device utilized: None Level of assistance: SBA  FUNCTIONAL TESTS:    M-CTSIB  Condition 1: Firm Surface, EO 30 Sec, Moderate Sway  Condition 2: Firm Surface, EC 3 Sec, Severe and LOB posteriorly  Sway  Condition 3: Foam Surface, EO NT  d/t safety Sec,  -  Sway  Condition 4: Foam Surface, EC NT d/t safety Sec,  -  Sway     PATIENT SURVEYS:  FOTO 48  VESTIBULAR ASSESSMENT:  GENERAL OBSERVATION: pt wears readers     OCULOMOTOR EXAM:  Ocular Alignment: normal  Ocular ROM: No Limitations  Spontaneous Nystagmus: absent  Gaze-Induced Nystagmus: absent  Smooth Pursuits: intact and pt reports sensation of "cross eyed"  Saccades: intact and c/o some wooziness  Convergence/Divergence: "trouble seeing" at ~ 1 foot away; c/o dizziness    VESTIBULAR - OCULAR REFLEX:   Slow VOR: Comment: c/o dizziness, diplopia, "not balanced" ; head nods/turns guarded and discontinuous  VOR Cancellation: Comment: no sx; trouble coordinating movement   Head-Impulse Test: HIT Right: slight positive  HIT Left: negative      POSITIONAL TESTING:  Right Roll Test: negative; c/o mild dizziness with movement, improved with holding position Left Roll Test: c/o mild dizziness with movement, improved with holding position Right Sidelying: negative; c/o very mild dizziness upon sitting up  Left Sidelying: negative; c/o very mild dizziness upon sitting up     VESTIBULAR TREATMENT:                                                                                                   DATE: 12/13/21   PATIENT EDUCATION: Education details: prognosis, POC, HEP, exam findings  Person educated: Patient and Spouse Education method: Explanation, Demonstration, Tactile cues, Verbal cues, and Handouts Education comprehension: verbalized  understanding and returned demonstration  HOME EXERCISE PROGRAM: Access Code: [email or web address] URL: [email or web address] Date: 12/13/2021 Prepared by: [facility]  Exercises - Seated Left Head Turns Vestibular Habituation  - 1 x daily - 5 x weekly - 2-3 sets - 30 sec hold - Seated Head Nods Vestibular Habituation  - 1 x daily - 5 x weekly - 2-3 sets - 30 sec hold - Narrow Stance with Counter Support  - 1 x daily - 5 x weekly - 2-3 sets - 30 sec hold   GOALS: Goals reviewed with patient? Yes  SHORT TERM GOALS: Target date: 01/03/2022  Patient to be independent with initial HEP. Baseline: HEP initiated Goal status: MET    LONG TERM GOALS: Target date: 02/07/2022  Patient to be independent with advanced HEP. Baseline: Not yet initiated  Goal status: IN PROGRESS  Patient to report 0/10 dizziness with standing vertical and horizontal VOR for 30 seconds. Baseline: Unable Goal status: IN PROGRESS  Patient will report 0/10 dizziness with bed mobility.  Baseline: Symptomatic  Goal status: IN PROGRESS  Patient to demonstrate mild sway with M-CTSIB condition with eyes closed/FIRM surface for 30 sec in order to improve safety in environments with uneven surfaces and dim lighting. Baseline: 3 sec before LOB Goal status: IN PROGRESS  Patient to score at least 20/24 on DGI in order to decrease risk of falls. Baseline: 13 Goal status: IN PROGRESS  Patient to demonstrate cervical AROM WFL and without pain limiting in order to improve ability to scan environment. Baseline:  Active ROM A/PROM (deg) eval  Flexion 25  Extension 18  Right lateral flexion 25  Left lateral flexion 21  Right rotation 37  Left rotation 51   (Blank rows = not tested) Goal status: IN PROGRESS  Patient to score at least 55 on FOTO in order to indicate improved functional outcomes.  Baseline: 48 Goal status: IN PROGRESS    ASSESSMENT:  CLINICAL  IMPRESSION: Patient arrived to session with continued sensation of L aural fullness. Mentions event when lights were turned off during tai-chi, which resulted in difficulty completing her routine. Session today focused on habituating head nods and bending activities as this was highlighted as a significant impairments last session. Patient required occasional cueing to decrease pace or otherwise correct form for improved tolerance/execution. Patient limited by anxiety to complete Romberg EC activity- better with feet slightly apart. Patient reported understanding of all edu provided and without complaints at end of session.     OBJECTIVE IMPAIRMENTS: Abnormal gait, decreased balance, decreased ROM, dizziness, impaired flexibility, improper body mechanics, postural dysfunction, and pain.   ACTIVITY LIMITATIONS: carrying, lifting, bending, sitting, standing, squatting, sleeping, stairs, transfers, bed mobility, bathing, dressing, reach over head, and caring for others  PARTICIPATION LIMITATIONS: meal prep, cleaning, laundry, shopping, community activity, and church  PERSONAL FACTORS: Age, Fitness, Past/current experiences, Time since onset of injury/illness/exacerbation, and 3+ comorbidities: Anemia, anxiety, asthma, L breast CA s/p lumpectomy, GERD, HLD, HTN, osteoporosis, polymyalgia rheumatica  are also affecting patient's functional outcome.   REHAB POTENTIAL: Good  CLINICAL DECISION MAKING: Evolving/moderate complexity  EVALUATION COMPLEXITY: Moderate   PLAN:  PT FREQUENCY: 2x/week  PT DURATION: 8 weeks  PLANNED INTERVENTIONS: Therapeutic exercises, Therapeutic activity, Neuromuscular re-education, Balance training, Gait training, Patient/Family education, Self Care, Joint mobilization, Stair training, Vestibular training, Canalith repositioning, DME instructions, Aquatic Therapy, Dry Needling, Electrical stimulation, Cryotherapy, Moist heat, Taping, Manual therapy, and  Re-evaluation  PLAN FOR NEXT SESSION: work on habituating head extension motions; progress HEP with cervical ROM, strengthening, stretching, reassess HEP, progress VOR and habituation    Janene Harvey, PT, DPT 02/01/22 9:36 AM  [facility] [address], [place], [place] [address] Phone # [phone] Fax # [phone]

## 2022-02-02 ENCOUNTER — Ambulatory Visit (HOSPITAL_COMMUNITY)
Admission: RE | Admit: 2022-02-02 | Discharge: 2022-02-02 | Disposition: A | Discharge status: Home / Self Care | Payer: Medicare Other | Source: Ambulatory Visit | Attending: Gastroenterology | Admitting: Gastroenterology

## 2022-02-02 DIAGNOSIS — K219 Gastro-esophageal reflux disease without esophagitis: Secondary | ICD-10-CM | POA: Diagnosis not present

## 2022-02-02 DIAGNOSIS — R079 Chest pain, unspecified: Secondary | ICD-10-CM | POA: Diagnosis not present

## 2022-02-02 DIAGNOSIS — K224 Dyskinesia of esophagus: Secondary | ICD-10-CM | POA: Diagnosis not present

## 2022-02-03 ENCOUNTER — Ambulatory Visit: Payer: Medicare Other | Admitting: Physical Therapy

## 2022-02-03 ENCOUNTER — Encounter: Payer: Self-pay | Admitting: Physical Therapy

## 2022-02-03 DIAGNOSIS — R2681 Unsteadiness on feet: Secondary | ICD-10-CM

## 2022-02-03 DIAGNOSIS — R42 Dizziness and giddiness: Secondary | ICD-10-CM | POA: Diagnosis not present

## 2022-02-03 DIAGNOSIS — M542 Cervicalgia: Secondary | ICD-10-CM | POA: Diagnosis not present

## 2022-02-07 NOTE — Therapy (Signed)
OUTPATIENT PHYSICAL THERAPY VESTIBULAR NOTE     Patient Name: Amanda Cannon MRN: [id number] DOB:[birth year], 77 y.o., female Today's Date: 02/08/2022      END OF SESSION:  PT End of Session - 02/08/22 0929     Visit Number 10    Number of Visits 15    Date for PT Re-Evaluation 03/17/22    Authorization Type Medicare/[employer]ARP    Progress Note Due on Visit 68    PT Start Time 0845    PT Stop Time 0929    PT Time Calculation (min) 44 min    Equipment Utilized During Treatment Gait belt    Activity Tolerance Patient tolerated treatment well    Behavior During Therapy WFL for tasks assessed/performed                    Past Medical History:  Diagnosis Date   Alcohol use    Anemia    Anxiety    Asthma    Basal cell carcinoma    forehead   Breast cancer ([place]) 1998   left    Colon polyps    GERD (gastroesophageal reflux disease)    Glaucoma    Hyperlipidemia    Hypertension    Hyponatremia    remote, mild   Mitral valve prolapse    Osteoarthritis    sees rheumatologist   Osteoporosis    sees rheumatologist   Personal history of radiation therapy 1998   PMR (polymyalgia rheumatica) ([place])    sees rheumatologist   Pneumonia    Prinzmetal angina ([place])    White matter changes    ? ischemic sm vs per pt   Past Surgical History:  Procedure Laterality Date   ABDOMINAL HYSTERECTOMY     BREAST LUMPECTOMY Left 1998   cancer   CESAREAN SECTION     x 4   COLONOSCOPY  07/2012   in [place]  2005   abdominal hernia repair   HYSTEROTOMY     LYMPH NODE DISSECTION Left    POLYPECTOMY     Patient Active Problem List   Diagnosis Date Noted   Vestibular disequilibrium 09/06/2021   DDD (degenerative disc disease), cervical 06/22/2021   History of tamoxifen therapy 05/13/2020   Increased risk of breast cancer 05/13/2020   Degenerative cervical disc 01/01/2020   Vertigo 08/06/2019   Cough 08/06/2019   Mixed rhinitis 08/06/2019    Gastroesophageal reflux disease 08/06/2019   Vestibular disorder 04/09/2019   Bilateral hearing loss 03/23/2018   ETD (Eustachian tube dysfunction), bilateral 03/23/2018   Facial numbness 03/23/2018   Age-related osteoporosis without current pathological fracture 01/15/2018   Breast pain, left 01/15/2018   Malignant neoplasm of lower-outer quadrant of left breast of female, estrogen receptor positive ([place]) 01/15/2018   Trigeminal neuropathy 11/13/2017   Pain in right hand 05/25/2017   PMR (polymyalgia rheumatica) ([place]) 04/24/2017   Hyponatremia 07/15/2015   Hyperlipidemia 07/15/2015   Esophageal spasm 07/15/2015   Essential hypertension 07/15/2015   Depression 07/15/2015   Asthma 07/15/2015   Tinnitus 07/15/2015   Osteoporosis 07/15/2015   Spasm    Prinzmetal angina ([place])    Mitral valve prolapse    White matter changes    Osteoarthritis 01/24/2013    PCP: [name], MD  REFERRING PROVIDER: Lyndal Pulley, DO  REFERRING DIAG: R26.89 (ICD-10-CM) - Balance disorder R27.9 (ICD-10-CM) - Coordination disorder  THERAPY DIAG:  Unsteadiness on feet  Dizziness and giddiness  Cervicalgia  ONSET DATE:  5 years ago  Rationale for Evaluation and Treatment: Rehabilitation  SUBJECTIVE:   SUBJECTIVE STATEMENT: Did not feel too bad except for yesterday was "horrible." "My whole head didn't know if it was right or left." Eyes still feel fuzzy; L eye feels weird. Not taking migraine meds.   Pt accompanied by: self  PERTINENT HISTORY: Anemia, anxiety, asthma, L breast CA s/p lumpectomy, GERD, HLD, HTN, osteoporosis, polymyalgia rheumatica  PAIN:  Are you having pain? Yes: NPRS scale: 4/10 Pain location: L temples and head Pain description: numbness, heaviness Aggravating factors: constant Relieving factors: nothing  PRECAUTIONS: Fall and Other: osteoporosis   WEIGHT BEARING RESTRICTIONS: No  FALLS: Has patient fallen in last 6 months? 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  PLOF: Independent  PATIENT GOALS: improved balance  OBJECTIVE:       TODAY'S TREATMENT: 02/08/22 Activity Comments  DNF lift offs in supine 10x3" Cueing for proper form and control; c/o difficulty and muscle ache   cervical retraction into towel roll 10x3" Good tolerance   cervical isometrics into ball on wall: R/L Sbing 5x5" Cueing for 50% effort; "that feels good"   cervical isometrics with manual resistance R/L rotation 5x5" Cueing for 50% effort; report of TTP on B temples  march + head nods to targets 4x30" C/o moderate wooziness; mild imbalance; cueing to slow down head nods to allow for improved gaze stability   mini squat + overhead ball bounce on wall 3x30" Cueing to toss higher to encourage increased cervical extension; c/o head pressure   standing wide on foam EC 2x30", then with additional head turns 30" Crouched posture and tendency for posterior sway              HOME EXERCISE PROGRAM Last updated: 02/08/21 Access Code: [email or web address] N URL: [email or web address] Date: 02/08/2022 Prepared by: [facility]  Program Notes perform standing in a corner/near a counter for safety  Exercises - Mid-Lower Cervical Extension SNAG with Strap  - 1 x daily - 5 x weekly - 2 sets - 10 reps - Seated Assisted Cervical Rotation with Towel  - 1 x daily - 5 x weekly - 2 sets - 10 reps - Cervical Retraction with Overpressure  - 1 x daily - 5 x weekly - 2 sets - 10 reps - 3 sec hold - Alternating Step Forward with Support  - 1 x daily - 5 x weekly - 2 sets - 10 reps - Standing Gaze Stabilization with Head Nod  - 1 x daily - 5 x weekly - 3 sets - 30 sec hold - Standing Balance with Eyes Closed  - 1 x daily - 5 x weekly - 2-3 sets - 30 sec hold - Supine Deep Neck Flexor  Training - Repetitions  - 1 x daily - 5 x weekly - 2 sets - 5 reps - 3 sec hold    PATIENT EDUCATION: Education details: HEP update Person educated: Patient Education method: Explanation, Demonstration, Tactile cues, Verbal cues, and Handouts Education comprehension: verbalized understanding and returned demonstration    Below measures were taken at time of initial evaluation unless otherwise specified:   DIAGNOSTIC FINDINGS: none recent  COGNITION: Overall cognitive status: Within functional limits for tasks assessed   SENSATION: Intact with light pressure over body and face  POSTURE:  rounded shoulders and forward head  LOWER EXTREMITY MMT:   MMT Right eval Left eval  Hip flexion    Hip abduction    Hip adduction    Hip internal rotation    Hip external rotation    Knee flexion    Knee extension    Ankle dorsiflexion    Ankle plantarflexion    Ankle inversion    Ankle eversion    (Blank rows = not tested)  GAIT: Gait pattern:  short step length, slow, guarded gait Assistive device utilized: None Level of assistance: SBA  FUNCTIONAL TESTS:    M-CTSIB  Condition 1: Firm Surface, EO 30 Sec, Moderate Sway  Condition 2: Firm Surface, EC 3 Sec, Severe and LOB posteriorly  Sway  Condition 3: Foam Surface, EO NT d/t safety Sec,  -  Sway  Condition 4: Foam Surface, EC NT d/t safety Sec,  -  Sway     PATIENT SURVEYS:  FOTO 48  VESTIBULAR ASSESSMENT:  GENERAL OBSERVATION: pt wears readers     OCULOMOTOR EXAM:  Ocular Alignment: normal  Ocular ROM: No Limitations  Spontaneous Nystagmus: absent  Gaze-Induced Nystagmus: absent  Smooth Pursuits: intact and pt reports sensation of "cross eyed"  Saccades: intact and c/o some wooziness  Convergence/Divergence: "trouble seeing" at ~ 1 foot away; c/o dizziness    VESTIBULAR - OCULAR REFLEX:   Slow VOR: Comment: c/o dizziness, diplopia, "not balanced" ; head nods/turns guarded and discontinuous  VOR  Cancellation: Comment: no sx; trouble coordinating movement   Head-Impulse Test: HIT Right: slight positive  HIT Left: negative      POSITIONAL TESTING:  Right Roll Test: negative; c/o mild dizziness with movement, improved with holding position Left Roll Test: c/o mild dizziness with movement, improved with holding position Right Sidelying: negative; c/o very mild dizziness upon sitting up  Left Sidelying: negative; c/o very mild dizziness upon sitting up     VESTIBULAR TREATMENT:                                                                                                   DATE: 12/13/21   PATIENT EDUCATION: Education details: prognosis, POC, HEP, exam findings  Person educated: Patient and Spouse Education method: Explanation, Demonstration, Tactile cues, Verbal cues, and Handouts Education comprehension: verbalized understanding and returned demonstration  HOME EXERCISE PROGRAM: Access Code: [email or web address] N URL: [email or web address] Date: 12/13/2021 Prepared by: [facility]  Exercises - Seated Left Head Turns Vestibular Habituation  - 1 x daily - 5 x weekly - 2-3 sets - 30 sec hold - Seated Head Nods Vestibular Habituation  - 1 x daily - 5 x weekly - 2-3 sets - 30 sec hold - Narrow Stance with Counter Support  - 1 x daily - 5 x weekly - 2-3 sets - 30 sec hold   GOALS: Goals reviewed with patient? Yes  SHORT TERM GOALS: Target date: 01/03/2022  Patient to be independent with initial HEP. Baseline: HEP initiated Goal status: MET    LONG TERM GOALS: Target date: 03/17/2022  Patient to be independent with advanced HEP. Baseline: Not yet  initiated; met for current 02/03/22 Goal status: IN PROGRESS 02/03/22  Patient to report 0/10 dizziness with standing vertical and horizontal VOR for 30 seconds. Baseline: Unable; 0-5/10 dizziness 02/03/22 Goal status: IN PROGRESS 02/03/22  Patient will report 0/10 dizziness with bed  mobility.  Baseline: Symptomatic ; 3-4/10 dizziness upon sitting up 02/03/22 Goal status: IN PROGRESS 02/03/22  Patient to demonstrate mild sway with M-CTSIB condition with eyes closed/FIRM surface for 30 sec in order to improve safety in environments with uneven surfaces and dim lighting. Baseline: 3 sec before LOB; MET 02/03/22 Goal status: MET 02/03/22  Patient to score at least 20/24 on DGI in order to decrease risk of falls. Baseline: 13, 16 02/03/22 Goal status: IN PROGRESS 02/03/22  Patient to demonstrate cervical AROM WFL and without pain limiting in order to improve ability to scan environment. Baseline:  Active ROM A/PROM (deg) eval AROM (deg) 02/03/22  Flexion 25 28  Extension 18 26  Right lateral flexion 25 15  Left lateral flexion 21 30  Right rotation 37 40 *head N/T  Left rotation 51 49 *head N/T   (Blank rows = not tested) Goal status: MET 02/03/22  Patient to score at least 55 on FOTO in order to indicate improved functional outcomes.  Baseline: 48; 45.0769 02/03/22 Goal status: IN PROGRESS 02/03/22    ASSESSMENT:  CLINICAL IMPRESSION: Patient arrived to session with report of increased symptoms yesterday without known cause. Patient performed cervical strengthening with focus on improving motor control throughout. Continued working on gaze stability activities in vertical directions as well as activities that require overhead reaching. Patient reported moderate wooziness and sensation of head pressure with these activities. Overall patient with good tolerance for session despite symptoms.     OBJECTIVE IMPAIRMENTS: Abnormal gait, decreased balance, decreased ROM, dizziness, impaired flexibility, improper body mechanics, postural dysfunction, and pain.   ACTIVITY LIMITATIONS: carrying, lifting, bending, sitting, standing, squatting, sleeping, stairs, transfers, bed mobility, bathing, dressing, reach over head, and caring for others  PARTICIPATION LIMITATIONS: meal  prep, cleaning, laundry, shopping, community activity, and church  PERSONAL FACTORS: Age, Fitness, Past/current experiences, Time since onset of injury/illness/exacerbation, and 3+ comorbidities: Anemia, anxiety, asthma, L breast CA s/p lumpectomy, GERD, HLD, HTN, osteoporosis, polymyalgia rheumatica  are also affecting patient's functional outcome.   REHAB POTENTIAL: Good  CLINICAL DECISION MAKING: Evolving/moderate complexity  EVALUATION COMPLEXITY: Moderate   PLAN:  PT FREQUENCY: 1x/week  PT DURATION: 6 weeks  PLANNED INTERVENTIONS: Therapeutic exercises, Therapeutic activity, Neuromuscular re-education, Balance training, Gait training, Patient/Family education, Self Care, Joint mobilization, Stair training, Vestibular training, Canalith repositioning, DME instructions, Aquatic Therapy, Dry Needling, Electrical stimulation, Cryotherapy, Moist heat, Taping, Manual therapy, and Re-evaluation  PLAN FOR NEXT SESSION: work on habituating head extension motions; cervical strengthening and motor control   Janene Harvey, PT, DPT 02/08/22 9:30 AM  [facility] at [facility] [address] [place], [place] [address] Phone # [phone] Fax # [phone]

## 2022-02-08 ENCOUNTER — Ambulatory Visit: Payer: Medicare Other | Admitting: Physical Therapy

## 2022-02-08 ENCOUNTER — Encounter: Payer: Self-pay | Admitting: Physical Therapy

## 2022-02-08 DIAGNOSIS — R2681 Unsteadiness on feet: Secondary | ICD-10-CM | POA: Diagnosis not present

## 2022-02-08 DIAGNOSIS — M542 Cervicalgia: Secondary | ICD-10-CM | POA: Diagnosis not present

## 2022-02-08 DIAGNOSIS — R42 Dizziness and giddiness: Secondary | ICD-10-CM | POA: Diagnosis not present

## 2022-02-10 ENCOUNTER — Ambulatory Visit: Payer: Medicare Other | Admitting: Physical Therapy

## 2022-02-12 ENCOUNTER — Other Ambulatory Visit: Payer: Self-pay | Admitting: Allergy & Immunology

## 2022-02-14 DIAGNOSIS — F320 Major depressive disorder, single episode, mild: Secondary | ICD-10-CM | POA: Diagnosis not present

## 2022-02-14 MED ORDER — FAMOTIDINE 40 MG PO TABS: 40.0000 mg | ORAL_TABLET | Freq: Two times a day (BID) | ORAL | 0 refills | Status: DC

## 2022-02-14 NOTE — Addendum Note (Signed)
Addended by: Julius Bowels on: 02/14/2022 05:33 PM   Modules accepted: Orders

## 2022-02-14 NOTE — Therapy (Signed)
OUTPATIENT PHYSICAL THERAPY VESTIBULAR NOTE     Patient Name: Amanda Cannon MRN: [id number] DOB:07-31-1945, 77 y.o., female Today's Date: 02/15/2022      END OF SESSION:  PT End of Session - 02/15/22 0919     Visit Number 11    Number of Visits 15    Date for PT Re-Evaluation 03/17/22    Authorization Type Medicare/AARP    Progress Note Due on Visit 69    PT Start Time 0837    PT Stop Time 0919    PT Time Calculation (min) 42 min    Equipment Utilized During Treatment Gait belt    Activity Tolerance Patient tolerated treatment well    Behavior During Therapy WFL for tasks assessed/performed                     Past Medical History:  Diagnosis Date   Alcohol use    Anemia    Anxiety    Asthma    Basal cell carcinoma    forehead   Breast cancer ([place]) 1998   left    Colon polyps    GERD (gastroesophageal reflux disease)    Glaucoma    Hyperlipidemia    Hypertension    Hyponatremia    remote, mild   Mitral valve prolapse    Osteoarthritis    sees rheumatologist   Osteoporosis    sees rheumatologist   Personal history of radiation therapy 1998   PMR (polymyalgia rheumatica) ([place])    sees rheumatologist   Pneumonia    Prinzmetal angina ([place])    White matter changes    ? ischemic sm vs per pt   Past Surgical History:  Procedure Laterality Date   ABDOMINAL HYSTERECTOMY     BREAST LUMPECTOMY Left 1998   cancer   CESAREAN SECTION     x 4   COLONOSCOPY  07/2012   in [place]  2005   abdominal hernia repair   HYSTEROTOMY     LYMPH NODE DISSECTION Left    POLYPECTOMY     Patient Active Problem List   Diagnosis Date Noted   Vestibular disequilibrium 09/06/2021   DDD (degenerative disc disease), cervical 06/22/2021   History of tamoxifen therapy 05/13/2020   Increased risk of breast cancer 05/13/2020   Degenerative cervical disc 01/01/2020   Vertigo 08/06/2019   Cough 08/06/2019   Mixed rhinitis 08/06/2019    Gastroesophageal reflux disease 08/06/2019   Vestibular disorder 04/09/2019   Bilateral hearing loss 03/23/2018   ETD (Eustachian tube dysfunction), bilateral 03/23/2018   Facial numbness 03/23/2018   Age-related osteoporosis without current pathological fracture 01/15/2018   Breast pain, left 01/15/2018   Malignant neoplasm of lower-outer quadrant of left breast of female, estrogen receptor positive ([place]) 01/15/2018   Trigeminal neuropathy 11/13/2017   Pain in right hand 05/25/2017   PMR (polymyalgia rheumatica) ([place]) 04/24/2017   Hyponatremia 07/15/2015   Hyperlipidemia 07/15/2015   Esophageal spasm 07/15/2015   Essential hypertension 07/15/2015   Depression 07/15/2015   Asthma 07/15/2015   Tinnitus 07/15/2015   Osteoporosis 07/15/2015   Spasm    Prinzmetal angina ([place])    Mitral valve prolapse    White matter changes    Osteoarthritis 01/24/2013    PCP: [name], MD  REFERRING PROVIDER: Lyndal Pulley, DO  REFERRING DIAG: R26.89 (ICD-10-CM) - Balance disorder R27.9 (ICD-10-CM) - Coordination disorder  THERAPY DIAG:  Unsteadiness on feet  Dizziness and giddiness  Cervicalgia  ONSET  DATE: 5 years ago  Rationale for Evaluation and Treatment: Rehabilitation  SUBJECTIVE:   SUBJECTIVE STATEMENT: Went to a falling seminar- it was very interesting.   Pt accompanied by: self  PERTINENT HISTORY: Anemia, anxiety, asthma, L breast CA s/p lumpectomy, GERD, HLD, HTN, osteoporosis, polymyalgia rheumatica  PAIN:  Are you having pain? Yes: NPRS scale: 5/10 Pain location: L temples and head Pain description: numbness, heaviness Aggravating factors: constant Relieving factors: nothing  PRECAUTIONS: Fall and Other: osteoporosis   WEIGHT BEARING RESTRICTIONS: No  FALLS: Has patient fallen in last 6 months? 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  PLOF: Independent  PATIENT GOALS: improved balance  OBJECTIVE:     TODAY'S TREATMENT: 02/15/22 Activity Comments  gait + vertical ball toss  Difficulty maintaining walking pattern despite cues to slow down; difficulty coordinating   Standing ball toss with visual tracking 2x15 Better coordination; c/o L head pressure   mini squat + overhead ball bounce on wall 3x30" Occasionally having to bend down to retrieve ball, causing dizziness; otherwise with c/o ear pressure   Standing bending over to roll ball, then stand 5x C/o ear pressure and wooziness   prone over pball cervical retractions 10x3" Good ROM; pt reported more reps than instructed   prone over pball B I, T's  Difficulty d/t weakness vs. Stiffness with I's; better form/ability with T's but pt noted pain in neck  Sitting cervical rotation SNAG 5x each To tolerance             PATIENT EDUCATION: Education details: advised patient to perform DNF lift offs within limited reps to avoid neck pain; educated on trying to avoid neck flexion with bending activities at home to help habituation this movement Person educated: Patient Education method: Explanation Education comprehension: verbalized understanding   HOME EXERCISE PROGRAM Last updated: 02/08/21 Access Code: [email or web address] N URL: [email or web address] Date: 02/08/2022 Prepared by: [facility]  Program Notes perform standing in a corner/near a counter for safety  Exercises - Mid-Lower Cervical Extension SNAG with Strap  - 1 x daily - 5 x weekly - 2 sets - 10 reps - Seated Assisted Cervical Rotation with Towel  - 1 x daily - 5 x weekly - 2 sets - 10 reps - Cervical Retraction with Overpressure  - 1 x daily - 5 x weekly - 2 sets - 10 reps - 3 sec hold - Alternating Step Forward with Support  - 1 x daily - 5 x weekly - 2 sets - 10 reps - Standing Gaze Stabilization with Head Nod  - 1 x daily -  5 x weekly - 3 sets - 30 sec hold - Standing Balance with Eyes Closed  - 1 x daily - 5 x weekly - 2-3 sets - 30 sec hold - Supine Deep Neck Flexor Training - Repetitions  - 1 x daily - 5 x weekly - 2 sets - 5 reps - 3 sec hold     Below measures were taken at time of initial evaluation unless otherwise specified:   DIAGNOSTIC FINDINGS: none recent  COGNITION: Overall cognitive status: Within functional limits for tasks assessed   SENSATION: Intact with light pressure over body and face  POSTURE:  rounded shoulders and forward head   LOWER EXTREMITY MMT:   MMT Right eval Left eval  Hip flexion  Hip abduction    Hip adduction    Hip internal rotation    Hip external rotation    Knee flexion    Knee extension    Ankle dorsiflexion    Ankle plantarflexion    Ankle inversion    Ankle eversion    (Blank rows = not tested)  GAIT: Gait pattern:  short step length, slow, guarded gait Assistive device utilized: None Level of assistance: SBA  FUNCTIONAL TESTS:    M-CTSIB  Condition 1: Firm Surface, EO 30 Sec, Moderate Sway  Condition 2: Firm Surface, EC 3 Sec, Severe and LOB posteriorly  Sway  Condition 3: Foam Surface, EO NT d/t safety Sec,  -  Sway  Condition 4: Foam Surface, EC NT d/t safety Sec,  -  Sway     PATIENT SURVEYS:  FOTO 48  VESTIBULAR ASSESSMENT:  GENERAL OBSERVATION: pt wears readers     OCULOMOTOR EXAM:  Ocular Alignment: normal  Ocular ROM: No Limitations  Spontaneous Nystagmus: absent  Gaze-Induced Nystagmus: absent  Smooth Pursuits: intact and pt reports sensation of "cross eyed"  Saccades: intact and c/o some wooziness  Convergence/Divergence: "trouble seeing" at ~ 1 foot away; c/o dizziness    VESTIBULAR - OCULAR REFLEX:   Slow VOR: Comment: c/o dizziness, diplopia, "not balanced" ; head nods/turns guarded and discontinuous  VOR Cancellation: Comment: no sx; trouble coordinating movement   Head-Impulse Test: HIT Right: slight  positive  HIT Left: negative      POSITIONAL TESTING:  Right Roll Test: negative; c/o mild dizziness with movement, improved with holding position Left Roll Test: c/o mild dizziness with movement, improved with holding position Right Sidelying: negative; c/o very mild dizziness upon sitting up  Left Sidelying: negative; c/o very mild dizziness upon sitting up     VESTIBULAR TREATMENT:                                                                                                   DATE: 12/13/21   PATIENT EDUCATION: Education details: prognosis, POC, HEP, exam findings  Person educated: Patient and Spouse Education method: Explanation, Demonstration, Tactile cues, Verbal cues, and Handouts Education comprehension: verbalized understanding and returned demonstration  HOME EXERCISE PROGRAM: Access Code: [email or web address] N URL: [email or web address] Date: 12/13/2021 Prepared by: [facility]  Exercises - Seated Left Head Turns Vestibular Habituation  - 1 x daily - 5 x weekly - 2-3 sets - 30 sec hold - Seated Head Nods Vestibular Habituation  - 1 x daily - 5 x weekly - 2-3 sets - 30 sec hold - Narrow Stance with Counter Support  - 1 x daily - 5 x weekly - 2-3 sets - 30 sec hold   GOALS: Goals reviewed with patient? Yes  SHORT TERM GOALS: Target date: 01/03/2022  Patient to be independent with initial HEP. Baseline: HEP initiated Goal status: MET    LONG TERM GOALS: Target date: 03/17/2022  Patient to be independent with advanced HEP. Baseline: Not yet initiated; met for current 02/03/22 Goal status: IN PROGRESS 02/03/22  Patient to report 0/10 dizziness  with standing vertical and horizontal VOR for 30 seconds. Baseline: Unable; 0-5/10 dizziness 02/03/22 Goal status: IN PROGRESS 02/03/22  Patient will report 0/10 dizziness with bed mobility.  Baseline: Symptomatic ; 3-4/10 dizziness upon sitting up 02/03/22 Goal status: IN PROGRESS  02/03/22  Patient to demonstrate mild sway with M-CTSIB condition with eyes closed/FIRM surface for 30 sec in order to improve safety in environments with uneven surfaces and dim lighting. Baseline: 3 sec before LOB; MET 02/03/22 Goal status: MET 02/03/22  Patient to score at least 20/24 on DGI in order to decrease risk of falls. Baseline: 13, 16 02/03/22 Goal status: IN PROGRESS 02/03/22  Patient to demonstrate cervical AROM WFL and without pain limiting in order to improve ability to scan environment. Baseline:  Active ROM A/PROM (deg) eval AROM (deg) 02/03/22  Flexion 25 28  Extension 18 26  Right lateral flexion 25 15  Left lateral flexion 21 30  Right rotation 37 40 *head N/T  Left rotation 51 49 *head N/T   (Blank rows = not tested) Goal status: MET 02/03/22  Patient to score at least 55 on FOTO in order to indicate improved functional outcomes.  Baseline: 48; 45.0769 02/03/22 Goal status: IN PROGRESS 02/03/22    ASSESSMENT:  CLINICAL IMPRESSION: Patient arrived to session without new complaints. Continued working on habituating cervical extension activities such as ball toss and overhead reaching. Patient also with imbalance and c/o dizziness with forward bending today, thus this was practiced as well. Proceeded with cervical and periscapular strengthening against gravity with increased difficulty with shoulder flexion d/t weakness vs. shoulder stiffness. Ended session with cervical stretching which was well tolerated. Patient reported feeling "better than it has been in a long time" upon leaving.     OBJECTIVE IMPAIRMENTS: Abnormal gait, decreased balance, decreased ROM, dizziness, impaired flexibility, improper body mechanics, postural dysfunction, and pain.   ACTIVITY LIMITATIONS: carrying, lifting, bending, sitting, standing, squatting, sleeping, stairs, transfers, bed mobility, bathing, dressing, reach over head, and caring for others  PARTICIPATION LIMITATIONS: meal  prep, cleaning, laundry, shopping, community activity, and church  PERSONAL FACTORS: Age, Fitness, Past/current experiences, Time since onset of injury/illness/exacerbation, and 3+ comorbidities: Anemia, anxiety, asthma, L breast CA s/p lumpectomy, GERD, HLD, HTN, osteoporosis, polymyalgia rheumatica  are also affecting patient's functional outcome.   REHAB POTENTIAL: Good  CLINICAL DECISION MAKING: Evolving/moderate complexity  EVALUATION COMPLEXITY: Moderate   PLAN:  PT FREQUENCY: 1x/week  PT DURATION: 6 weeks  PLANNED INTERVENTIONS: Therapeutic exercises, Therapeutic activity, Neuromuscular re-education, Balance training, Gait training, Patient/Family education, Self Care, Joint mobilization, Stair training, Vestibular training, Canalith repositioning, DME instructions, Aquatic Therapy, Dry Needling, Electrical stimulation, Cryotherapy, Moist heat, Taping, Manual therapy, and Re-evaluation  PLAN FOR NEXT SESSION: work on habituating head extension motions; cervical strengthening and motor control   Janene Harvey, PT, DPT 02/15/22 9:21 AM  [facility] at [facility] [address], [place], [place] [address] Phone # [phone] Fax # [phone]

## 2022-02-15 ENCOUNTER — Ambulatory Visit: Payer: Medicare Other | Admitting: Physical Therapy

## 2022-02-15 ENCOUNTER — Encounter: Payer: Self-pay | Admitting: Physical Therapy

## 2022-02-15 DIAGNOSIS — M542 Cervicalgia: Secondary | ICD-10-CM

## 2022-02-15 DIAGNOSIS — R42 Dizziness and giddiness: Secondary | ICD-10-CM

## 2022-02-15 DIAGNOSIS — R2681 Unsteadiness on feet: Secondary | ICD-10-CM | POA: Diagnosis not present

## 2022-02-21 NOTE — Therapy (Incomplete)
OUTPATIENT PHYSICAL THERAPY VESTIBULAR NOTE     Patient Name: Amanda Cannon MRN: [id number] DOB:[birth year], 77 y.o., female Today's Date: 02/21/2022      END OF SESSION:            Past Medical History:  Diagnosis Date   Alcohol use    Anemia    Anxiety    Asthma    Basal cell carcinoma    forehead   Breast cancer ([place]) 1998   left    Colon polyps    GERD (gastroesophageal reflux disease)    Glaucoma    Hyperlipidemia    Hypertension    Hyponatremia    remote, mild   Mitral valve prolapse    Osteoarthritis    sees rheumatologist   Osteoporosis    sees rheumatologist   Personal history of radiation therapy 1998   PMR (polymyalgia rheumatica) ([place])    sees rheumatologist   Pneumonia    Prinzmetal angina ([place])    White matter changes    ? ischemic sm vs per pt   Past Surgical History:  Procedure Laterality Date   ABDOMINAL HYSTERECTOMY     BREAST LUMPECTOMY Left 1998   cancer   CESAREAN SECTION     x 4   COLONOSCOPY  07/2012   in [place]  2005   abdominal hernia repair   HYSTEROTOMY     LYMPH NODE DISSECTION Left    POLYPECTOMY     Patient Active Problem List   Diagnosis Date Noted   Vestibular disequilibrium 09/06/2021   DDD (degenerative disc disease), cervical 06/22/2021   History of tamoxifen therapy 05/13/2020   Increased risk of breast cancer 05/13/2020   Degenerative cervical disc 01/01/2020   Vertigo 08/06/2019   Cough 08/06/2019   Mixed rhinitis 08/06/2019   Gastroesophageal reflux disease 08/06/2019   Vestibular disorder 04/09/2019   Bilateral hearing loss 03/23/2018   ETD (Eustachian tube dysfunction), bilateral 03/23/2018   Facial numbness 03/23/2018   Age-related osteoporosis without current pathological fracture 01/15/2018   Breast pain, left 01/15/2018   Malignant neoplasm of lower-outer quadrant of left breast of female, estrogen receptor positive ([place]) 01/15/2018   Trigeminal neuropathy 11/13/2017    Pain in right hand 05/25/2017   PMR (polymyalgia rheumatica) ([place]) 04/24/2017   Hyponatremia 07/15/2015   Hyperlipidemia 07/15/2015   Esophageal spasm 07/15/2015   Essential hypertension 07/15/2015   Depression 07/15/2015   Asthma 07/15/2015   Tinnitus 07/15/2015   Osteoporosis 07/15/2015   Spasm    Prinzmetal angina ([place])    Mitral valve prolapse    White matter changes    Osteoarthritis 01/24/2013    PCP: [name], MD  REFERRING PROVIDER: Lyndal Pulley, DO  REFERRING DIAG: R26.89 (ICD-10-CM) - Balance disorder R27.9 (ICD-10-CM) - Coordination disorder  THERAPY DIAG:  No diagnosis found.  ONSET DATE: 5 years ago  Rationale for Evaluation and Treatment: Rehabilitation  SUBJECTIVE:   SUBJECTIVE STATEMENT: Went to a falling seminar- it was very interesting.   Pt accompanied by: self  PERTINENT HISTORY: Anemia, anxiety, asthma, L breast CA s/p lumpectomy, GERD, HLD, HTN, osteoporosis, polymyalgia rheumatica  PAIN:  Are you having pain? Yes: NPRS scale: 5/10 Pain location: L temples and head Pain description: numbness, heaviness Aggravating factors: constant Relieving factors: nothing  PRECAUTIONS: Fall and Other: osteoporosis   WEIGHT BEARING RESTRICTIONS: No  FALLS: Has patient fallen in last 6 months? No  LIVING ENVIRONMENT: Lives with: lives with their spouse Lives in: Other senior  living- independent living  Stairs: No Has following equipment at home: Shower bench and Grab bars  PLOF: Independent  PATIENT GOALS: improved balance  OBJECTIVE:    TODAY'S TREATMENT: 02/22/22 Activity Comments                      HOME EXERCISE PROGRAM Last updated: 02/08/21 Access Code: [email or web address] N URL: [email or web address] Date: 02/08/2022 Prepared by: [facility]  Program Notes perform standing in a corner/near a counter for safety  Exercises - Mid-Lower Cervical Extension SNAG with Strap   - 1 x daily - 5 x weekly - 2 sets - 10 reps - Seated Assisted Cervical Rotation with Towel  - 1 x daily - 5 x weekly - 2 sets - 10 reps - Cervical Retraction with Overpressure  - 1 x daily - 5 x weekly - 2 sets - 10 reps - 3 sec hold - Alternating Step Forward with Support  - 1 x daily - 5 x weekly - 2 sets - 10 reps - Standing Gaze Stabilization with Head Nod  - 1 x daily - 5 x weekly - 3 sets - 30 sec hold - Standing Balance with Eyes Closed  - 1 x daily - 5 x weekly - 2-3 sets - 30 sec hold - Supine Deep Neck Flexor Training - Repetitions  - 1 x daily - 5 x weekly - 2 sets - 5 reps - 3 sec hold     Below measures were taken at time of initial evaluation unless otherwise specified:   DIAGNOSTIC FINDINGS: none recent  COGNITION: Overall cognitive status: Within functional limits for tasks assessed   SENSATION: Intact with light pressure over body and face  POSTURE:  rounded shoulders and forward head   LOWER EXTREMITY MMT:   MMT Right eval Left eval  Hip flexion    Hip abduction    Hip adduction    Hip internal rotation    Hip external rotation    Knee flexion    Knee extension    Ankle dorsiflexion    Ankle plantarflexion    Ankle inversion    Ankle eversion    (Blank rows = not tested)  GAIT: Gait pattern:  short step length, slow, guarded gait Assistive device utilized: None Level of assistance: SBA  FUNCTIONAL TESTS:    M-CTSIB  Condition 1: Firm Surface, EO 30 Sec, Moderate Sway  Condition 2: Firm Surface, EC 3 Sec, Severe and LOB posteriorly  Sway  Condition 3: Foam Surface, EO NT d/t safety Sec,  -  Sway  Condition 4: Foam Surface, EC NT d/t safety Sec,  -  Sway     PATIENT SURVEYS:  FOTO 48  VESTIBULAR ASSESSMENT:  GENERAL OBSERVATION: pt wears readers     OCULOMOTOR EXAM:  Ocular Alignment: normal  Ocular ROM: No Limitations  Spontaneous Nystagmus: absent  Gaze-Induced Nystagmus: absent  Smooth Pursuits: intact and pt reports  sensation of "cross eyed"  Saccades: intact and c/o some wooziness  Convergence/Divergence: "trouble seeing" at ~ 1 foot away; c/o dizziness    VESTIBULAR - OCULAR REFLEX:   Slow VOR: Comment: c/o dizziness, diplopia, "not balanced" ; head nods/turns guarded and discontinuous  VOR Cancellation: Comment: no sx; trouble coordinating movement   Head-Impulse Test: HIT Right: slight positive  HIT Left: negative      POSITIONAL TESTING:  Right Roll Test: negative; c/o mild dizziness with movement, improved with holding position Left Roll Test: c/o  mild dizziness with movement, improved with holding position Right Sidelying: negative; c/o very mild dizziness upon sitting up  Left Sidelying: negative; c/o very mild dizziness upon sitting up     VESTIBULAR TREATMENT:                                                                                                   DATE: 12/13/21   PATIENT EDUCATION: Education details: prognosis, POC, HEP, exam findings  Person educated: Patient and Spouse Education method: Explanation, Demonstration, Tactile cues, Verbal cues, and Handouts Education comprehension: verbalized understanding and returned demonstration  HOME EXERCISE PROGRAM: Access Code: [email or web address] N URL: [email or web address] Date: 12/13/2021 Prepared by: [facility]  Exercises - Seated Left Head Turns Vestibular Habituation  - 1 x daily - 5 x weekly - 2-3 sets - 30 sec hold - Seated Head Nods Vestibular Habituation  - 1 x daily - 5 x weekly - 2-3 sets - 30 sec hold - Narrow Stance with Counter Support  - 1 x daily - 5 x weekly - 2-3 sets - 30 sec hold   GOALS: Goals reviewed with patient? Yes  SHORT TERM GOALS: Target date: 01/03/2022  Patient to be independent with initial HEP. Baseline: HEP initiated Goal status: MET    LONG TERM GOALS: Target date: 03/17/2022  Patient to be independent with advanced HEP. Baseline: Not yet  initiated; met for current 02/03/22 Goal status: IN PROGRESS 02/03/22  Patient to report 0/10 dizziness with standing vertical and horizontal VOR for 30 seconds. Baseline: Unable; 0-5/10 dizziness 02/03/22 Goal status: IN PROGRESS 02/03/22  Patient will report 0/10 dizziness with bed mobility.  Baseline: Symptomatic ; 3-4/10 dizziness upon sitting up 02/03/22 Goal status: IN PROGRESS 02/03/22  Patient to demonstrate mild sway with M-CTSIB condition with eyes closed/FIRM surface for 30 sec in order to improve safety in environments with uneven surfaces and dim lighting. Baseline: 3 sec before LOB; MET 02/03/22 Goal status: MET 02/03/22  Patient to score at least 20/24 on DGI in order to decrease risk of falls. Baseline: 13, 16 02/03/22 Goal status: IN PROGRESS 02/03/22  Patient to demonstrate cervical AROM WFL and without pain limiting in order to improve ability to scan environment. Baseline:  Active ROM A/PROM (deg) eval AROM (deg) 02/03/22  Flexion 25 28  Extension 18 26  Right lateral flexion 25 15  Left lateral flexion 21 30  Right rotation 37 40 *head N/T  Left rotation 51 49 *head N/T   (Blank rows = not tested) Goal status: MET 02/03/22  Patient to score at least 55 on FOTO in order to indicate improved functional outcomes.  Baseline: 48; 45.0769 02/03/22 Goal status: IN PROGRESS 02/03/22    ASSESSMENT:  CLINICAL IMPRESSION: Patient arrived to session without new complaints. Continued working on habituating cervical extension activities such as ball toss and overhead reaching. Patient also with imbalance and c/o dizziness with forward bending today, thus this was practiced as well. Proceeded with cervical and periscapular strengthening against gravity with increased difficulty with shoulder flexion d/t weakness vs. shoulder stiffness. Ended session  with cervical stretching which was well tolerated. Patient reported feeling "better than it has been in a long time" upon leaving.      OBJECTIVE IMPAIRMENTS: Abnormal gait, decreased balance, decreased ROM, dizziness, impaired flexibility, improper body mechanics, postural dysfunction, and pain.   ACTIVITY LIMITATIONS: carrying, lifting, bending, sitting, standing, squatting, sleeping, stairs, transfers, bed mobility, bathing, dressing, reach over head, and caring for others  PARTICIPATION LIMITATIONS: meal prep, cleaning, laundry, shopping, community activity, and church  PERSONAL FACTORS: Age, Fitness, Past/current experiences, Time since onset of injury/illness/exacerbation, and 3+ comorbidities: Anemia, anxiety, asthma, L breast CA s/p lumpectomy, GERD, HLD, HTN, osteoporosis, polymyalgia rheumatica  are also affecting patient's functional outcome.   REHAB POTENTIAL: Good  CLINICAL DECISION MAKING: Evolving/moderate complexity  EVALUATION COMPLEXITY: Moderate   PLAN:  PT FREQUENCY: 1x/week  PT DURATION: 6 weeks  PLANNED INTERVENTIONS: Therapeutic exercises, Therapeutic activity, Neuromuscular re-education, Balance training, Gait training, Patient/Family education, Self Care, Joint mobilization, Stair training, Vestibular training, Canalith repositioning, DME instructions, Aquatic Therapy, Dry Needling, Electrical stimulation, Cryotherapy, Moist heat, Taping, Manual therapy, and Re-evaluation  PLAN FOR NEXT SESSION: work on habituating head extension motions; cervical strengthening and motor control   Janene Harvey, PT, DPT 02/21/22 9:26 AM  [facility] at [facility] [address] [place],  [address] Phone # [phone] Fax # [phone]

## 2022-02-22 ENCOUNTER — Ambulatory Visit: Payer: Medicare Other | Admitting: Physical Therapy

## 2022-02-22 ENCOUNTER — Encounter: Payer: Self-pay | Admitting: Physical Therapy

## 2022-02-22 DIAGNOSIS — R2681 Unsteadiness on feet: Secondary | ICD-10-CM

## 2022-02-22 DIAGNOSIS — M542 Cervicalgia: Secondary | ICD-10-CM

## 2022-02-22 DIAGNOSIS — R42 Dizziness and giddiness: Secondary | ICD-10-CM | POA: Diagnosis not present

## 2022-02-22 NOTE — Therapy (Signed)
OUTPATIENT PHYSICAL THERAPY VESTIBULAR NOTE     Patient Name: Amanda Cannon MRN: [id number] DOB:November 17, 1945, 77 y.o., female Today's Date: 02/22/2022      END OF SESSION:  PT End of Session - 02/22/22 0935     Visit Number 12    Number of Visits 15    Date for PT Re-Evaluation 03/17/22    Authorization Type Medicare/AARP    Progress Note Due on Visit 65    PT Start Time 0851    PT Stop Time 0932    PT Time Calculation (min) 41 min    Equipment Utilized During Treatment Gait belt    Activity Tolerance Patient tolerated treatment well    Behavior During Therapy WFL for tasks assessed/performed                     Past Medical History:  Diagnosis Date   Alcohol use    Anemia    Anxiety    Asthma    Basal cell carcinoma    forehead   Breast cancer ([place]) 1998   left    Colon polyps    GERD (gastroesophageal reflux disease)    Glaucoma    Hyperlipidemia    Hypertension    Hyponatremia    remote, mild   Mitral valve prolapse    Osteoarthritis    sees rheumatologist   Osteoporosis    sees rheumatologist   Personal history of radiation therapy 1998   PMR (polymyalgia rheumatica) ([place])    sees rheumatologist   Pneumonia    Prinzmetal angina ([place])    White matter changes    ? ischemic sm vs per pt   Past Surgical History:  Procedure Laterality Date   ABDOMINAL HYSTERECTOMY     BREAST LUMPECTOMY Left 1998   cancer   CESAREAN SECTION     x 4   COLONOSCOPY  07/2012   in [place]  2005   abdominal hernia repair   HYSTEROTOMY     LYMPH NODE DISSECTION Left    POLYPECTOMY     Patient Active Problem List   Diagnosis Date Noted   Vestibular disequilibrium 09/06/2021   DDD (degenerative disc disease), cervical 06/22/2021   History of tamoxifen therapy 05/13/2020   Increased risk of breast cancer 05/13/2020   Degenerative cervical disc 01/01/2020   Vertigo 08/06/2019   Cough 08/06/2019   Mixed rhinitis 08/06/2019    Gastroesophageal reflux disease 08/06/2019   Vestibular disorder 04/09/2019   Bilateral hearing loss 03/23/2018   ETD (Eustachian tube dysfunction), bilateral 03/23/2018   Facial numbness 03/23/2018   Age-related osteoporosis without current pathological fracture 01/15/2018   Breast pain, left 01/15/2018   Malignant neoplasm of lower-outer quadrant of left breast of female, estrogen receptor positive ([place]) 01/15/2018   Trigeminal neuropathy 11/13/2017   Pain in right hand 05/25/2017   PMR (polymyalgia rheumatica) ([place]) 04/24/2017   Hyponatremia 07/15/2015   Hyperlipidemia 07/15/2015   Esophageal spasm 07/15/2015   Essential hypertension 07/15/2015   Depression 07/15/2015   Asthma 07/15/2015   Tinnitus 07/15/2015   Osteoporosis 07/15/2015   Spasm    Prinzmetal angina ([place])    Mitral valve prolapse    White matter changes    Osteoarthritis 01/24/2013    PCP: [name], MD  REFERRING PROVIDER: Lyndal Pulley, DO  REFERRING DIAG: R26.89 (ICD-10-CM) - Balance disorder R27.9 (ICD-10-CM) - Coordination disorder  THERAPY DIAG:  Unsteadiness on feet  Dizziness and giddiness  Cervicalgia  ONSET  DATE: 5 years ago  Rationale for Evaluation and Treatment: Rehabilitation  SUBJECTIVE:   SUBJECTIVE STATEMENT: Had an RSV shot on Wednesday and has been sick for a couple days after that. B arms are bruised- suspects that she is allergic to it. Feeling better now. My wooziness isn't quite as bad- been practicing.   Pt accompanied by: self  PERTINENT HISTORY: Anemia, anxiety, asthma, L breast CA s/p lumpectomy, GERD, HLD, HTN, osteoporosis, polymyalgia rheumatica  PAIN:  Are you having pain? Yes: NPRS scale: 1-2/10 Pain location: L temples and head Pain description: numbness, heaviness Aggravating factors: constant Relieving factors: nothing  PRECAUTIONS: Fall and Other: osteoporosis   WEIGHT BEARING RESTRICTIONS: No  FALLS: Has patient fallen in last 6 months?  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  PLOF: Independent  PATIENT GOALS: improved balance  OBJECTIVE:    TODAY'S TREATMENT: 02/22/22 Activity Comments  cervical extension, flexion, R/L SBing isometrics with ball on wall 5x5" each direction Good tolerance  cervical retraction into ball on wall 10x3"  Good form   R/L UT stretch 20" Cues to avoid compensating with trunk  standing against wall B scaption 2# 2x5 Stopping at ~90 deg d/t weakness  standing wide on foam EC (+ head turns/nods) 2x30" each Slow and min A occasionally required d/t posterior LOB  gait + diagonal head movements 6x14f  CGA; c/o dizziness and limited L foot clearance throughout; cues to slow down  Standing bending over to roll ball, then stand 5x Good stability and tolerance    PATIENT EDUCATION: Education details: discussed POC- discussed possibly wrapping up by appt on 2/20 d/t good progress; HEP update- to be performed at counter top for safety Person educated: Patient Education method: Explanation Education comprehension: verbalized understanding    HOME EXERCISE PROGRAM Last updated: 02/22/22 Access Code: [email or web address] N URL: [email or web address] Date: 02/22/2022 Prepared by: [facility]  Program Notes perform standing in a corner/near a counter for safety  Exercises - Mid-Lower Cervical Extension SNAG with Strap  - 1 x daily - 5 x weekly - 2 sets - 10 reps - Seated Assisted Cervical Rotation with Towel  - 1 x daily - 5 x weekly - 2 sets - 10 reps - Cervical Retraction with Overpressure  - 1 x daily - 5 x weekly - 2 sets - 10 reps - 3 sec hold - Alternating Step Forward with Support  - 1 x daily - 5 x weekly - 2 sets - 10 reps - Standing Gaze Stabilization with Head Nod  - 1 x daily - 5 x weekly - 3 sets - 30  sec hold - Standing Balance with Eyes Closed  - 1 x daily - 5 x weekly - 2-3 sets - 30 sec hold - Supine Deep Neck Flexor Training - Repetitions  - 1 x daily - 5 x weekly - 2 sets - 5 reps - 3 sec hold - Forward Walking with Diagonal Head Turns  - 1 x daily - 5 x weekly - 4 sets - Supine Deep Neck Flexor Training - Repetitions  - 1 x daily - 5 x weekly - 2 sets - 5 reps - 3 sec hold     Below measures were taken at time of initial evaluation unless otherwise specified:   DIAGNOSTIC FINDINGS: none recent  COGNITION: Overall cognitive status: Within  functional limits for tasks assessed   SENSATION: Intact with light pressure over body and face  POSTURE:  rounded shoulders and forward head   LOWER EXTREMITY MMT:   MMT Right eval Left eval  Hip flexion    Hip abduction    Hip adduction    Hip internal rotation    Hip external rotation    Knee flexion    Knee extension    Ankle dorsiflexion    Ankle plantarflexion    Ankle inversion    Ankle eversion    (Blank rows = not tested)  GAIT: Gait pattern:  short step length, slow, guarded gait Assistive device utilized: None Level of assistance: SBA  FUNCTIONAL TESTS:    M-CTSIB  Condition 1: Firm Surface, EO 30 Sec, Moderate Sway  Condition 2: Firm Surface, EC 3 Sec, Severe and LOB posteriorly  Sway  Condition 3: Foam Surface, EO NT d/t safety Sec,  -  Sway  Condition 4: Foam Surface, EC NT d/t safety Sec,  -  Sway     PATIENT SURVEYS:  FOTO 48  VESTIBULAR ASSESSMENT:  GENERAL OBSERVATION: pt wears readers     OCULOMOTOR EXAM:  Ocular Alignment: normal  Ocular ROM: No Limitations  Spontaneous Nystagmus: absent  Gaze-Induced Nystagmus: absent  Smooth Pursuits: intact and pt reports sensation of "cross eyed"  Saccades: intact and c/o some wooziness  Convergence/Divergence: "trouble seeing" at ~ 1 foot away; c/o dizziness    VESTIBULAR - OCULAR REFLEX:   Slow VOR: Comment: c/o dizziness, diplopia, "not  balanced" ; head nods/turns guarded and discontinuous  VOR Cancellation: Comment: no sx; trouble coordinating movement   Head-Impulse Test: HIT Right: slight positive  HIT Left: negative      POSITIONAL TESTING:  Right Roll Test: negative; c/o mild dizziness with movement, improved with holding position Left Roll Test: c/o mild dizziness with movement, improved with holding position Right Sidelying: negative; c/o very mild dizziness upon sitting up  Left Sidelying: negative; c/o very mild dizziness upon sitting up     VESTIBULAR TREATMENT:                                                                                                   DATE: 12/13/21   PATIENT EDUCATION: Education details: prognosis, POC, HEP, exam findings  Person educated: Patient and Spouse Education method: Explanation, Demonstration, Tactile cues, Verbal cues, and Handouts Education comprehension: verbalized understanding and returned demonstration  HOME EXERCISE PROGRAM: Access Code: [email or web address] N URL: [email or web address] Date: 12/13/2021 Prepared by: [facility]  Exercises - Seated Left Head Turns Vestibular Habituation  - 1 x daily - 5 x weekly - 2-3 sets - 30 sec hold - Seated Head Nods Vestibular Habituation  - 1 x daily - 5 x weekly - 2-3 sets - 30 sec hold - Narrow Stance with Counter Support  - 1 x daily - 5 x weekly - 2-3 sets - 30 sec hold   GOALS: Goals reviewed with patient? Yes  SHORT TERM GOALS: Target date: 01/03/2022  Patient to be independent with initial HEP.  Baseline: HEP initiated Goal status: MET    LONG TERM GOALS: Target date: 03/17/2022  Patient to be independent with advanced HEP. Baseline: Not yet initiated; met for current 02/03/22 Goal status: IN PROGRESS 02/03/22  Patient to report 0/10 dizziness with standing vertical and horizontal VOR for 30 seconds. Baseline: Unable; 0-5/10 dizziness 02/03/22 Goal status: IN  PROGRESS 02/03/22  Patient will report 0/10 dizziness with bed mobility.  Baseline: Symptomatic ; 3-4/10 dizziness upon sitting up 02/03/22 Goal status: IN PROGRESS 02/03/22  Patient to demonstrate mild sway with M-CTSIB condition with eyes closed/FIRM surface for 30 sec in order to improve safety in environments with uneven surfaces and dim lighting. Baseline: 3 sec before LOB; MET 02/03/22 Goal status: MET 02/03/22  Patient to score at least 20/24 on DGI in order to decrease risk of falls. Baseline: 13, 16 02/03/22 Goal status: IN PROGRESS 02/03/22  Patient to demonstrate cervical AROM WFL and without pain limiting in order to improve ability to scan environment. Baseline:  Active ROM A/PROM (deg) eval AROM (deg) 02/03/22  Flexion 25 28  Extension 18 26  Right lateral flexion 25 15  Left lateral flexion 21 30  Right rotation 37 40 *head N/T  Left rotation 51 49 *head N/T   (Blank rows = not tested) Goal status: MET 02/03/22  Patient to score at least 55 on FOTO in order to indicate improved functional outcomes.  Baseline: 48; 45.0769 02/03/22 Goal status: IN PROGRESS 02/03/22    ASSESSMENT:  CLINICAL IMPRESSION: Patient arrived to session with report of improvement in dizziness since working with PT. Discussed POC and patient in agreement of possible D/C on 03/15/22. Patient performed cervical and shoulder strengthening activities with good tolerance today. Shoulder weakness evident with exercises today. Multisensory balance activities revealed posterior LOB with min A required to correct. Challenged balance further with gait with diagonal head movements which required cues for pacing for max control and stability- advised patient to practice this at home along a counter top for safety. Forward bending activities were much better-tolerated today. Patient reported understanding of all edu provided and without complaints at end of session.      OBJECTIVE IMPAIRMENTS: Abnormal gait,  decreased balance, decreased ROM, dizziness, impaired flexibility, improper body mechanics, postural dysfunction, and pain.   ACTIVITY LIMITATIONS: carrying, lifting, bending, sitting, standing, squatting, sleeping, stairs, transfers, bed mobility, bathing, dressing, reach over head, and caring for others  PARTICIPATION LIMITATIONS: meal prep, cleaning, laundry, shopping, community activity, and church  PERSONAL FACTORS: Age, Fitness, Past/current experiences, Time since onset of injury/illness/exacerbation, and 3+ comorbidities: Anemia, anxiety, asthma, L breast CA s/p lumpectomy, GERD, HLD, HTN, osteoporosis, polymyalgia rheumatica  are also affecting patient's functional outcome.   REHAB POTENTIAL: Good  CLINICAL DECISION MAKING: Evolving/moderate complexity  EVALUATION COMPLEXITY: Moderate   PLAN:  PT FREQUENCY: 1x/week  PT DURATION: 6 weeks  PLANNED INTERVENTIONS: Therapeutic exercises, Therapeutic activity, Neuromuscular re-education, Balance training, Gait training, Patient/Family education, Self Care, Joint mobilization, Stair training, Vestibular training, Canalith repositioning, DME instructions, Aquatic Therapy, Dry Needling, Electrical stimulation, Cryotherapy, Moist heat, Taping, Manual therapy, and Re-evaluation  PLAN FOR NEXT SESSION: work on habituating head extension motions; cervical strengthening and motor control   Janene Harvey, PT, DPT 02/22/22 9:36 AM  [facility] [address] [place], [place] [address] Phone # [phone] Fax # [phone]

## 2022-02-24 ENCOUNTER — Encounter: Payer: Self-pay | Admitting: Allergy & Immunology

## 2022-02-24 ENCOUNTER — Other Ambulatory Visit: Payer: Self-pay

## 2022-02-24 ENCOUNTER — Ambulatory Visit (INDEPENDENT_AMBULATORY_CARE_PROVIDER_SITE_OTHER): Payer: Medicare Other | Admitting: Allergy & Immunology

## 2022-02-24 VITALS — BP 120/62 | HR 82 | Temp 98.5°F | Resp 32

## 2022-02-24 DIAGNOSIS — R42 Dizziness and giddiness: Secondary | ICD-10-CM | POA: Diagnosis not present

## 2022-02-24 DIAGNOSIS — R053 Chronic cough: Secondary | ICD-10-CM | POA: Diagnosis not present

## 2022-02-24 DIAGNOSIS — J310 Chronic rhinitis: Secondary | ICD-10-CM | POA: Diagnosis not present

## 2022-02-24 DIAGNOSIS — K219 Gastro-esophageal reflux disease without esophagitis: Secondary | ICD-10-CM | POA: Diagnosis not present

## 2022-02-24 NOTE — Patient Instructions (Addendum)
1. Cough with postnasal drip (sensitization to molds) - Continue with cetrizine '10mg'$  twice daily.  - Continue with azelastine one spray per nostril nightly as needed.   2. Gastroesophageal reflux disease - Continue with famotidine '40mg'$  up to twice daily.   3. Return in about 1 year (around 02/25/2023).    Please inform us of any Emergency Department visits, hospitalizations, or changes in symptoms. Call us before going to the ED for breathing or allergy symptoms since we might be able to fit you in for a sick visit. Feel free to contact us anytime with any questions, problems, or concerns.  It was a pleasure to see you and your family again today!  Websites that have reliable patient information: 1. American Academy of Asthma, Allergy, and Immunology: www.aaaai.org 2. Food Allergy Research and Education (FARE): foodallergy.org 3. Mothers of Asthmatics: http://www.asthmacommunitynetwork.org 4. American College of Allergy, Asthma, and Immunology: www.acaai.org   COVID-19 Vaccine Information can be found at: ShippingScam.co.uk For questions related to vaccine distribution or appointments, please email [email or web address] or call [phone].   We realize that you might be concerned about having an allergic reaction to the COVID19 vaccines. To help with that concern, WE ARE OFFERING THE COVID19 VACCINES IN OUR OFFICE! Ask the front desk for dates!     "Like" [place] on Facebook and Instagram for our latest updates!      A healthy democracy works best when [employer] participate! Make sure you are registered to vote! If you have moved or changed any of your contact information, you will need to get this updated before voting!  In some cases, you MAY be able to register to vote online: [email or web address]

## 2022-02-24 NOTE — Progress Notes (Signed)
FOLLOW UP  Date of Service/Encounter:  02/24/22   Assessment:   Cough - with previously normal spirometry   Chronic rhinitis (molds) - consider intradermal testing in the future   Gastroesophageal reflux disease   Vertigo -previously followed by the vestibular rehab program with Dr. Lindley Magnus local reaction to RSV vaccine  Plan/Recommendations:   1. Cough with postnasal drip (sensitization to molds) - Continue with cetrizine '10mg'$  twice daily.  - Continue with azelastine one spray per nostril nightly as needed.   2. Gastroesophageal reflux disease - Continue with famotidine '40mg'$  up to twice daily.   3. Return in about 1 year (around 02/25/2023).   Subjective:   Amanda Cannon is a 77 y.o. female presenting today for follow up of  Chief Complaint  Patient presents with   Follow-up    1 year follow up no concerns    Amanda Cannon has a history of the following: Patient Active Problem List   Diagnosis Date Noted   Vestibular disequilibrium 09/06/2021   DDD (degenerative disc disease), cervical 06/22/2021   History of tamoxifen therapy 05/13/2020   Increased risk of breast cancer 05/13/2020   Degenerative cervical disc 01/01/2020   Vertigo 08/06/2019   Cough 08/06/2019   Mixed rhinitis 08/06/2019   Gastroesophageal reflux disease 08/06/2019   Vestibular disorder 04/09/2019   Bilateral hearing loss 03/23/2018   ETD (Eustachian tube dysfunction), bilateral 03/23/2018   Facial numbness 03/23/2018   Age-related osteoporosis without current pathological fracture 01/15/2018   Breast pain, left 01/15/2018   Malignant neoplasm of lower-outer quadrant of left breast of female, estrogen receptor positive ([place]) 01/15/2018   Trigeminal neuropathy 11/13/2017   Pain in right hand 05/25/2017   PMR (polymyalgia rheumatica) ([place]) 04/24/2017   Hyponatremia 07/15/2015   Hyperlipidemia 07/15/2015   Esophageal spasm 07/15/2015   Essential hypertension 07/15/2015    Depression 07/15/2015   Asthma 07/15/2015   Tinnitus 07/15/2015   Osteoporosis 07/15/2015   Spasm    Prinzmetal angina ([facility])    Mitral valve prolapse    White matter changes    Osteoarthritis 01/24/2013    History obtained from: chart review and patient and her husband .  Amanda Cannon is a 77 y.o. female presenting for a follow up visit.  She was last seen 1 year ago.  At that time, we continued cetirizine as well as Astelin.  We started her on gabapentin 100 mg nightly to see if that helped with the cough.  For her GERD, we continue with famotidine 40 mg at night.  She also was in vestibular rehabilitation for her vertigo.  Since last visit, she has largely done well. She had a reaction to the RSV vaccine. She got it in her right arm and she immediately started having burning pain. She had a large local reaction and she was very tired. She slept for four days. She is on the mend.   Asthma/Respiratory Symptom History: Coughing is better She coughs ostly at night and in the morning.  Gabapentin never helped.    Allergic Rhinitis Symptom History: She remains on the Astelin which she uses as needed.  She has cetirizine 10 mg daily that she uses twice daily occasionally.  She was seeing Dr. Benjamine Mola, but apparently just dismissed her.  She did do 12 weeks of vestibular rehabilitation which was minimally helpful.  He sent her to see someone at [facility] - Dr. Blane Ohara.  He felt that she was experiencing vestibular migraine.  It was recommended  that she use betahistine to treat her symptoms.  She has this for 2 weeks but felt it was increasing her intraocular pressure.  There were no changes in her symptoms when she was on it anyway.  She has not seen him since October 2023.  GERD Symptom History: She remains on the famotidine twice daily.  This seems to be controlling her symptoms. She tells me that she had what she describes as palpitations in her chest.  She ended up doing a barium swallow and it was  discovered that she had mild dysmotility of the lower third of the esophagus with mild GERD.  She follows with Dr. Fuller Plan.  Otherwise, there have been no changes to her past medical history, surgical history, family history, or social history.    Review of Systems  Constitutional: Negative.  Negative for chills, fever, malaise/fatigue and weight loss.  HENT:  Negative for congestion, ear discharge, ear pain and sinus pain.   Eyes:  Negative for pain, discharge and redness.  Respiratory:  Positive for cough. Negative for sputum production, shortness of breath and wheezing.   Cardiovascular: Negative.  Negative for chest pain and palpitations.  Gastrointestinal:  Negative for abdominal pain, constipation, diarrhea, heartburn, nausea and vomiting.  Skin: Negative.  Negative for itching and rash.  Neurological:  Negative for dizziness and headaches.  Endo/Heme/Allergies:  Negative for environmental allergies. Does not bruise/bleed easily.       Objective:   Blood pressure 120/62, pulse 82, temperature 98.5 F (36.9 C), resp. rate (!) 32, SpO2 98 %. There is no height or weight on file to calculate BMI.    Physical Exam Vitals reviewed.  Constitutional:      Appearance: She is well-developed.     Comments: Delightful.  HENT:     Head: Normocephalic and atraumatic.     Right Ear: Tympanic membrane, ear canal and external ear normal.     Left Ear: Tympanic membrane, ear canal and external ear normal.     Nose: Rhinorrhea present. No nasal deformity, septal deviation or mucosal edema.     Right Turbinates: Pale. Not enlarged or swollen.     Left Turbinates: Pale. Not enlarged or swollen.     Right Sinus: No maxillary sinus tenderness or frontal sinus tenderness.     Left Sinus: No maxillary sinus tenderness or frontal sinus tenderness.     Comments: Some dried rhinorrhea present.     Mouth/Throat:     Mouth: Mucous membranes are not pale and not dry.     Pharynx: Uvula midline.   Eyes:     General:        Right eye: No discharge.        Left eye: No discharge.     Conjunctiva/sclera: Conjunctivae normal.     Right eye: Right conjunctiva is not injected. No chemosis.    Left eye: Left conjunctiva is not injected. No chemosis.    Pupils: Pupils are equal, round, and reactive to light.  Cardiovascular:     Rate and Rhythm: Normal rate and regular rhythm.     Heart sounds: Normal heart sounds.  Pulmonary:     Effort: Pulmonary effort is normal. No tachypnea, accessory muscle usage or respiratory distress.     Breath sounds: Normal breath sounds. No wheezing, rhonchi or rales.  Chest:     Chest wall: No tenderness.  Lymphadenopathy:     Cervical: No cervical adenopathy.  Skin:    Coloration: Skin is not pale.  Findings: No abrasion, erythema, petechiae or rash. Rash is not papular, urticarial or vesicular.  Neurological:     Mental Status: She is alert.  Psychiatric:        Behavior: Behavior is cooperative.      Diagnostic studies: none   Salvatore Marvel, MD  [facility]

## 2022-02-28 ENCOUNTER — Encounter: Payer: Self-pay | Admitting: Allergy & Immunology

## 2022-02-28 NOTE — Therapy (Signed)
OUTPATIENT PHYSICAL THERAPY VESTIBULAR NOTE     Patient Name: Amanda Cannon MRN: [id number] DOB:04/02/1945, 77 y.o., female Today's Date: 03/01/2022      END OF SESSION:  PT End of Session - 03/01/22 0925     Visit Number 13    Number of Visits 15    Date for PT Re-Evaluation 03/17/22    Authorization Type Medicare/AARP    Progress Note Due on Visit 80    PT Start Time 0844    PT Stop Time 0925    PT Time Calculation (min) 41 min    Equipment Utilized During Treatment Gait belt    Activity Tolerance Patient tolerated treatment well    Behavior During Therapy WFL for tasks assessed/performed                      Past Medical History:  Diagnosis Date   Alcohol use    Anemia    Anxiety    Asthma    Basal cell carcinoma    forehead   Breast cancer ([place]) 1998   left    Colon polyps    GERD (gastroesophageal reflux disease)    Glaucoma    Hyperlipidemia    Hypertension    Hyponatremia    remote, mild   Mitral valve prolapse    Osteoarthritis    sees rheumatologist   Osteoporosis    sees rheumatologist   Personal history of radiation therapy 1998   PMR (polymyalgia rheumatica) ([place])    sees rheumatologist   Pneumonia    Prinzmetal angina ([place])    White matter changes    ? ischemic sm vs per pt   Past Surgical History:  Procedure Laterality Date   ABDOMINAL HYSTERECTOMY     BREAST LUMPECTOMY Left 1998   cancer   CESAREAN SECTION     x 4   COLONOSCOPY  07/2012   in [place]  2005   abdominal hernia repair   HYSTEROTOMY     LYMPH NODE DISSECTION Left    POLYPECTOMY     Patient Active Problem List   Diagnosis Date Noted   Vestibular disequilibrium 09/06/2021   DDD (degenerative disc disease), cervical 06/22/2021   History of tamoxifen therapy 05/13/2020   Increased risk of breast cancer 05/13/2020   Degenerative cervical disc 01/01/2020   Vertigo 08/06/2019   Cough 08/06/2019   Mixed rhinitis 08/06/2019    Gastroesophageal reflux disease 08/06/2019   Vestibular disorder 04/09/2019   Bilateral hearing loss 03/23/2018   ETD (Eustachian tube dysfunction), bilateral 03/23/2018   Facial numbness 03/23/2018   Age-related osteoporosis without current pathological fracture 01/15/2018   Breast pain, left 01/15/2018   Malignant neoplasm of lower-outer quadrant of left breast of female, estrogen receptor positive ([place]) 01/15/2018   Trigeminal neuropathy 11/13/2017   Pain in right hand 05/25/2017   PMR (polymyalgia rheumatica) ([place]) 04/24/2017   Hyponatremia 07/15/2015   Hyperlipidemia 07/15/2015   Esophageal spasm 07/15/2015   Essential hypertension 07/15/2015   Depression 07/15/2015   Asthma 07/15/2015   Tinnitus 07/15/2015   Osteoporosis 07/15/2015   Spasm    Prinzmetal angina ([place])    Mitral valve prolapse    White matter changes    Osteoarthritis 01/24/2013    PCP: [name], MD  REFERRING PROVIDER: Lyndal Pulley, DO  REFERRING DIAG: R26.89 (ICD-10-CM) - Balance disorder R27.9 (ICD-10-CM) - Coordination disorder  THERAPY DIAG:  Unsteadiness on feet  Dizziness and giddiness  Cervicalgia  ONSET DATE: 5 years ago  Rationale for Evaluation and Treatment: Rehabilitation  SUBJECTIVE:   SUBJECTIVE STATEMENT: Doing good. Still has dizziness if she "plays too hard" at volleyball. Feeling like the air pressure influences her feeling of head pressure.   Pt accompanied by: self  PERTINENT HISTORY: Anemia, anxiety, asthma, L breast CA s/p lumpectomy, GERD, HLD, HTN, osteoporosis, polymyalgia rheumatica  PAIN:  Are you having pain? Yes: NPRS scale: 7/10 Pain location: L temples and head Pain description: numbness, heaviness Aggravating factors: constant Relieving factors: nothing  PRECAUTIONS: Fall and Other: osteoporosis   WEIGHT BEARING RESTRICTIONS: No  FALLS: Has patient fallen in last 6 months? No  LIVING ENVIRONMENT: Lives with: lives with their  spouse Lives in: Other senior living- independent living  Stairs: No Has following equipment at home: Electronics engineer and Grab bars  PLOF: Independent  PATIENT GOALS: improved balance  OBJECTIVE:    TODAY'S TREATMENT: 03/01/22 Activity Comments  prone I T Y's   Standing vertical VOR 2x30" C/o "cracking in my neck"  2nd set at quicker pace- c/o mild wooziness   Gait + diagonal head movements 2x43f Short step length/height; slightly improved coordination   1/2 turns in doorway  C/o moderate dizziness; encouraged fewer steps; good speed  Standing EC 2x30" Trialed romberg but unable d/t imbalance; able to perform with narrow BOS; c/o head pressure   Standing heel/toe raise  Cueing to maintain knees straight    PATIENT EDUCATION: Education details: discussion on DC on 03/15/22; review and update of HEP- turns to be performed in doorway and with husband nearby Person educated: Patient Education method: Explanation, Demonstration, Tactile cues, Verbal cues, and Handouts Education comprehension: verbalized understanding and returned demonstration    HOME EXERCISE PROGRAM Last updated: 02/08/21 Access Code: [email or web address] URL: [email or web address] Date: 02/08/2022 Prepared by: [facility]  Program Notes perform standing in a corner/near a counter for safety  Exercises - Mid-Lower Cervical Extension SNAG with Strap  - 1 x daily - 5 x weekly - 2 sets - 10 reps - Seated Assisted Cervical Rotation with Towel  - 1 x daily - 5 x weekly - 2 sets - 10 reps - Cervical Retraction with Overpressure  - 1 x daily - 5 x weekly - 2 sets - 10 reps - 3 sec hold - Alternating Step Forward with Support  - 1 x daily - 5 x weekly - 2 sets - 10 reps - Standing Gaze Stabilization with Head Nod  - 1 x daily - 5 x weekly - 3 sets - 30 sec hold - Standing Balance with Eyes Closed  - 1 x daily - 5 x weekly - 2-3 sets - 30 sec hold - Supine Deep Neck Flexor Training  - Repetitions  - 1 x daily - 5 x weekly - 2 sets - 5 reps - 3 sec hold     Below measures were taken at time of initial evaluation unless otherwise specified:   DIAGNOSTIC FINDINGS: none recent  COGNITION: Overall cognitive status: Within functional limits for tasks assessed   SENSATION: Intact with light pressure over body and face  POSTURE:  rounded shoulders and forward head   LOWER EXTREMITY MMT:   MMT Right eval Left eval  Hip flexion    Hip abduction    Hip adduction    Hip internal rotation    Hip external rotation    Knee flexion    Knee extension    Ankle dorsiflexion  Ankle plantarflexion    Ankle inversion    Ankle eversion    (Blank rows = not tested)  GAIT: Gait pattern:  short step length, slow, guarded gait Assistive device utilized: None Level of assistance: SBA  FUNCTIONAL TESTS:    M-CTSIB  Condition 1: Firm Surface, EO 30 Sec, Moderate Sway  Condition 2: Firm Surface, EC 3 Sec, Severe and LOB posteriorly  Sway  Condition 3: Foam Surface, EO NT d/t safety Sec,  -  Sway  Condition 4: Foam Surface, EC NT d/t safety Sec,  -  Sway     PATIENT SURVEYS:  FOTO 48  VESTIBULAR ASSESSMENT:  GENERAL OBSERVATION: pt wears readers     OCULOMOTOR EXAM:  Ocular Alignment: normal  Ocular ROM: No Limitations  Spontaneous Nystagmus: absent  Gaze-Induced Nystagmus: absent  Smooth Pursuits: intact and pt reports sensation of "cross eyed"  Saccades: intact and c/o some wooziness  Convergence/Divergence: "trouble seeing" at ~ 1 foot away; c/o dizziness    VESTIBULAR - OCULAR REFLEX:   Slow VOR: Comment: c/o dizziness, diplopia, "not balanced" ; head nods/turns guarded and discontinuous  VOR Cancellation: Comment: no sx; trouble coordinating movement   Head-Impulse Test: HIT Right: slight positive  HIT Left: negative      POSITIONAL TESTING:  Right Roll Test: negative; c/o mild dizziness with movement, improved with holding position Left  Roll Test: c/o mild dizziness with movement, improved with holding position Right Sidelying: negative; c/o very mild dizziness upon sitting up  Left Sidelying: negative; c/o very mild dizziness upon sitting up     VESTIBULAR TREATMENT:                                                                                                   DATE: 12/13/21   PATIENT EDUCATION: Education details: prognosis, POC, HEP, exam findings  Person educated: Patient and Spouse Education method: Explanation, Demonstration, Tactile cues, Verbal cues, and Handouts Education comprehension: verbalized understanding and returned demonstration  HOME EXERCISE PROGRAM: Access Code: [email or web address] N URL: [email or web address] Date: 12/13/2021 Prepared by: [facility]  Exercises - Seated Left Head Turns Vestibular Habituation  - 1 x daily - 5 x weekly - 2-3 sets - 30 sec hold - Seated Head Nods Vestibular Habituation  - 1 x daily - 5 x weekly - 2-3 sets - 30 sec hold - Narrow Stance with Counter Support  - 1 x daily - 5 x weekly - 2-3 sets - 30 sec hold   GOALS: Goals reviewed with patient? Yes  SHORT TERM GOALS: Target date: 01/03/2022  Patient to be independent with initial HEP. Baseline: HEP initiated Goal status: MET    LONG TERM GOALS: Target date: 03/17/2022  Patient to be independent with advanced HEP. Baseline: Not yet initiated; met for current 02/03/22 Goal status: IN PROGRESS 02/03/22  Patient to report 0/10 dizziness with standing vertical and horizontal VOR for 30 seconds. Baseline: Unable; 0-5/10 dizziness 02/03/22 Goal status: IN PROGRESS 02/03/22  Patient will report 0/10 dizziness with bed mobility.  Baseline: Symptomatic ; 3-4/10 dizziness upon sitting up  02/03/22 Goal status: IN PROGRESS 02/03/22  Patient to demonstrate mild sway with M-CTSIB condition with eyes closed/FIRM surface for 30 sec in order to improve safety in environments with  uneven surfaces and dim lighting. Baseline: 3 sec before LOB; MET 02/03/22 Goal status: MET 02/03/22  Patient to score at least 20/24 on DGI in order to decrease risk of falls. Baseline: 13, 16 02/03/22 Goal status: IN PROGRESS 02/03/22  Patient to demonstrate cervical AROM WFL and without pain limiting in order to improve ability to scan environment. Baseline:  Active ROM A/PROM (deg) eval AROM (deg) 02/03/22  Flexion 25 28  Extension 18 26  Right lateral flexion 25 15  Left lateral flexion 21 30  Right rotation 37 40 *head N/T  Left rotation 51 49 *head N/T   (Blank rows = not tested) Goal status: MET 02/03/22  Patient to score at least 55 on FOTO in order to indicate improved functional outcomes.  Baseline: 48; 45.0769 02/03/22 Goal status: IN PROGRESS 02/03/22    ASSESSMENT:  CLINICAL IMPRESSION: Patient arrived to session without new complaints. Reviewed and updated HEP for max benefits and agreed on D/C on 2/20/2. Patient demonstrated slightly improved coordination with gait with diagonal head movements, however still with some reduced step length and height evident. Trialed turns with good speed evident, however with some imbalance and patient with report of wooziness. Patient with c/o L LE "not working right" thus initiated gentle ankle strengthening. Patient reported understanding of all edu provided and without complaints at end of session.      OBJECTIVE IMPAIRMENTS: Abnormal gait, decreased balance, decreased ROM, dizziness, impaired flexibility, improper body mechanics, postural dysfunction, and pain.   ACTIVITY LIMITATIONS: carrying, lifting, bending, sitting, standing, squatting, sleeping, stairs, transfers, bed mobility, bathing, dressing, reach over head, and caring for others  PARTICIPATION LIMITATIONS: meal prep, cleaning, laundry, shopping, community activity, and church  PERSONAL FACTORS: Age, Fitness, Past/current experiences, Time since onset of  injury/illness/exacerbation, and 3+ comorbidities: Anemia, anxiety, asthma, L breast CA s/p lumpectomy, GERD, HLD, HTN, osteoporosis, polymyalgia rheumatica  are also affecting patient's functional outcome.   REHAB POTENTIAL: Good  CLINICAL DECISION MAKING: Evolving/moderate complexity  EVALUATION COMPLEXITY: Moderate   PLAN:  PT FREQUENCY: 1x/week  PT DURATION: 6 weeks  PLANNED INTERVENTIONS: Therapeutic exercises, Therapeutic activity, Neuromuscular re-education, Balance training, Gait training, Patient/Family education, Self Care, Joint mobilization, Stair training, Vestibular training, Canalith repositioning, DME instructions, Aquatic Therapy, Dry Needling, Electrical stimulation, Cryotherapy, Moist heat, Taping, Manual therapy, and Re-evaluation  PLAN FOR NEXT SESSION: work on habituating head extension motions; cervical strengthening and motor control   Janene Harvey, PT, DPT 03/01/22 9:26 AM  [facility] [address] [place], [place] [address] Phone # [phone] Fax # [phone]

## 2022-03-01 ENCOUNTER — Encounter: Payer: Self-pay | Admitting: Internal Medicine

## 2022-03-01 ENCOUNTER — Ambulatory Visit: Payer: Medicare Other | Admitting: Internal Medicine

## 2022-03-01 ENCOUNTER — Encounter: Payer: Self-pay | Admitting: Physical Therapy

## 2022-03-01 ENCOUNTER — Ambulatory Visit: Payer: Medicare Other | Attending: Family Medicine | Admitting: Physical Therapy

## 2022-03-01 VITALS — BP 110/80 | HR 100 | Temp 98.0°F | Resp 18 | Ht <= 58 in | Wt 97.0 lb

## 2022-03-01 DIAGNOSIS — H8192 Unspecified disorder of vestibular function, left ear: Secondary | ICD-10-CM

## 2022-03-01 DIAGNOSIS — M542 Cervicalgia: Secondary | ICD-10-CM | POA: Diagnosis not present

## 2022-03-01 DIAGNOSIS — I10 Essential (primary) hypertension: Secondary | ICD-10-CM | POA: Diagnosis not present

## 2022-03-01 DIAGNOSIS — R2681 Unsteadiness on feet: Secondary | ICD-10-CM | POA: Insufficient documentation

## 2022-03-01 DIAGNOSIS — J4520 Mild intermittent asthma, uncomplicated: Secondary | ICD-10-CM | POA: Diagnosis not present

## 2022-03-01 DIAGNOSIS — R42 Dizziness and giddiness: Secondary | ICD-10-CM | POA: Diagnosis not present

## 2022-03-01 NOTE — Assessment & Plan Note (Signed)
stable °

## 2022-03-01 NOTE — Assessment & Plan Note (Signed)
She is getting neuro PT and that has helped.

## 2022-03-01 NOTE — Progress Notes (Addendum)
   Office Visit  Subjective   Patient ID: Amanda Cannon   DOB: August 03, 1945   Age: 77 y.o.   MRN: [id number]   Chief Complaint Chief Complaint  Patient presents with   Follow-up    Hypertension     History of Present Illness 77 years old female is here for follow up. She has hypertension, chronic vertigo and dizziness due to vestibular disorder and asthma. Her symptoms are well controlled. She take meclizine for vertigo and that helps. She wants PT for vestibular disorder.   Past Medical History Past Medical History:  Diagnosis Date   Alcohol use    Anemia    Anxiety    Asthma    Basal cell carcinoma    forehead   Breast cancer ([facility]) 1998   left    Colon polyps    GERD (gastroesophageal reflux disease)    Glaucoma    Hyperlipidemia    Hypertension    Hyponatremia    remote, mild   Mitral valve prolapse    Osteoarthritis    sees rheumatologist   Osteoporosis    sees rheumatologist   Personal history of radiation therapy 1998   PMR (polymyalgia rheumatica) ([facility])    sees rheumatologist   Pneumonia    Prinzmetal angina ([facility])    White matter changes    ? ischemic sm vs per pt     Allergies Allergies  Allergen Reactions   Tylenol [Acetaminophen] Cough   Turmeric     Lowers BP   Betamethasone     Other reaction(s): Not available   Erythromycin     Stomach cramps   Erythromycin Base Other (See Comments)    Stomach issues   Other     Beta-blockers-patient states it makes her crazy   Sulfa Antibiotics Other (See Comments)    Doesn't recall     Review of Systems Review of Systems  Constitutional: Negative.   HENT: Negative.    Respiratory: Negative.    Cardiovascular: Negative.   Gastrointestinal: Negative.   Neurological:  Positive for dizziness.       Objective:    Vitals BP 110/80 (BP Location: Right Arm, Patient Position: Sitting, Cuff Size: Normal)   Pulse 100   Temp 98 F (36.7 C) (Temporal)   Resp 18   Ht 4\' 8"  (1.422 m)   Wt 97 lb  (44 kg)   BMI 21.75 kg/m    Physical Examination Physical Exam Constitutional:      Appearance: Normal appearance.  HENT:     Head: Normocephalic and atraumatic.  Cardiovascular:     Rate and Rhythm: Normal rate and regular rhythm.     Heart sounds: Normal heart sounds.  Pulmonary:     Effort: Pulmonary effort is normal.     Breath sounds: Normal breath sounds.  Neurological:     General: No focal deficit present.     Mental Status: She is alert and oriented to person, place, and time.        Assessment & Plan:   Essential hypertension stable  Vestibular disorder She is getting neuro PT and that has helped.  Asthma stable    Return in about 3 months (around 05/30/2022).   Eloisa Northern, MD

## 2022-03-14 NOTE — Therapy (Signed)
OUTPATIENT PHYSICAL THERAPY VESTIBULAR NOTE     Patient Name: Amanda Cannon MRN: [id number] DOB:12-25-1945, 77 y.o., female Today's Date: 03/14/2022      END OF SESSION:             Past Medical History:  Diagnosis Date   Alcohol use    Anemia    Anxiety    Asthma    Basal cell carcinoma    forehead   Breast cancer ([place]) 1998   left    Colon polyps    GERD (gastroesophageal reflux disease)    Glaucoma    Hyperlipidemia    Hypertension    Hyponatremia    remote, mild   Mitral valve prolapse    Osteoarthritis    sees rheumatologist   Osteoporosis    sees rheumatologist   Personal history of radiation therapy 1998   PMR (polymyalgia rheumatica) ([place])    sees rheumatologist   Pneumonia    Prinzmetal angina ([place])    White matter changes    ? ischemic sm vs per pt   Past Surgical History:  Procedure Laterality Date   ABDOMINAL HYSTERECTOMY     BREAST LUMPECTOMY Left 1998   cancer   CESAREAN SECTION     x 4   COLONOSCOPY  07/2012   in [place]  2005   abdominal hernia repair   HYSTEROTOMY     LYMPH NODE DISSECTION Left    POLYPECTOMY     Patient Active Problem List   Diagnosis Date Noted   Vestibular disequilibrium 09/06/2021   DDD (degenerative disc disease), cervical 06/22/2021   History of tamoxifen therapy 05/13/2020   Increased risk of breast cancer 05/13/2020   Degenerative cervical disc 01/01/2020   Vertigo 08/06/2019   Cough 08/06/2019   Mixed rhinitis 08/06/2019   Gastroesophageal reflux disease 08/06/2019   Vestibular disorder 04/09/2019   Bilateral hearing loss 03/23/2018   ETD (Eustachian tube dysfunction), bilateral 03/23/2018   Facial numbness 03/23/2018   Age-related osteoporosis without current pathological fracture 01/15/2018   Breast pain, left 01/15/2018   Malignant neoplasm of lower-outer quadrant of left breast of female, estrogen receptor positive ([place]) 01/15/2018   Trigeminal neuropathy 11/13/2017    Pain in right hand 05/25/2017   PMR (polymyalgia rheumatica) ([place]) 04/24/2017   Hyponatremia 07/15/2015   Hyperlipidemia 07/15/2015   Esophageal spasm 07/15/2015   Essential hypertension 07/15/2015   Depression 07/15/2015   Asthma 07/15/2015   Tinnitus 07/15/2015   Osteoporosis 07/15/2015   Spasm    Prinzmetal angina ([place])    Mitral valve prolapse    White matter changes    Osteoarthritis 01/24/2013    PCP: [name], MD  REFERRING PROVIDER: Lyndal Pulley, DO  REFERRING DIAG: R26.89 (ICD-10-CM) - Balance disorder R27.9 (ICD-10-CM) - Coordination disorder  THERAPY DIAG:  No diagnosis found.  ONSET DATE: 5 years ago  Rationale for Evaluation and Treatment: Rehabilitation  SUBJECTIVE:   SUBJECTIVE STATEMENT: Doing good. Still has dizziness if she "plays too hard" at volleyball. Feeling like the air pressure influences her feeling of head pressure.   Pt accompanied by: self  PERTINENT HISTORY: Anemia, anxiety, asthma, L breast CA s/p lumpectomy, GERD, HLD, HTN, osteoporosis, polymyalgia rheumatica  PAIN:  Are you having pain? Yes: NPRS scale: 7/10 Pain location: L temples and head Pain description: numbness, heaviness Aggravating factors: constant Relieving factors: nothing  PRECAUTIONS: Fall and Other: osteoporosis   WEIGHT BEARING RESTRICTIONS: No  FALLS: Has patient fallen in last 6  months? 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  PLOF: Independent  PATIENT GOALS: improved balance  OBJECTIVE:     TODAY'S TREATMENT: 03/15/22 Activity Comments                       HOME EXERCISE PROGRAM Last updated: 02/08/21 Access Code: [email or web address] URL: [email or web address] Date: 02/08/2022 Prepared by: [facility]  Program Notes perform standing  in a corner/near a counter for safety  Exercises - Mid-Lower Cervical Extension SNAG with Strap  - 1 x daily - 5 x weekly - 2 sets - 10 reps - Seated Assisted Cervical Rotation with Towel  - 1 x daily - 5 x weekly - 2 sets - 10 reps - Cervical Retraction with Overpressure  - 1 x daily - 5 x weekly - 2 sets - 10 reps - 3 sec hold - Alternating Step Forward with Support  - 1 x daily - 5 x weekly - 2 sets - 10 reps - Standing Gaze Stabilization with Head Nod  - 1 x daily - 5 x weekly - 3 sets - 30 sec hold - Standing Balance with Eyes Closed  - 1 x daily - 5 x weekly - 2-3 sets - 30 sec hold - Supine Deep Neck Flexor Training - Repetitions  - 1 x daily - 5 x weekly - 2 sets - 5 reps - 3 sec hold     Below measures were taken at time of initial evaluation unless otherwise specified:   DIAGNOSTIC FINDINGS: none recent  COGNITION: Overall cognitive status: Within functional limits for tasks assessed   SENSATION: Intact with light pressure over body and face  POSTURE:  rounded shoulders and forward head   LOWER EXTREMITY MMT:   MMT Right eval Left eval  Hip flexion    Hip abduction    Hip adduction    Hip internal rotation    Hip external rotation    Knee flexion    Knee extension    Ankle dorsiflexion    Ankle plantarflexion    Ankle inversion    Ankle eversion    (Blank rows = not tested)  GAIT: Gait pattern:  short step length, slow, guarded gait Assistive device utilized: None Level of assistance: SBA  FUNCTIONAL TESTS:    M-CTSIB  Condition 1: Firm Surface, EO 30 Sec, Moderate Sway  Condition 2: Firm Surface, EC 3 Sec, Severe and LOB posteriorly  Sway  Condition 3: Foam Surface, EO NT d/t safety Sec,  -  Sway  Condition 4: Foam Surface, EC NT d/t safety Sec,  -  Sway     PATIENT SURVEYS:  FOTO 48  VESTIBULAR ASSESSMENT:  GENERAL OBSERVATION: pt wears readers     OCULOMOTOR EXAM:  Ocular Alignment: normal  Ocular ROM: No Limitations  Spontaneous  Nystagmus: absent  Gaze-Induced Nystagmus: absent  Smooth Pursuits: intact and pt reports sensation of "cross eyed"  Saccades: intact and c/o some wooziness  Convergence/Divergence: "trouble seeing" at ~ 1 foot away; c/o dizziness    VESTIBULAR - OCULAR REFLEX:   Slow VOR: Comment: c/o dizziness, diplopia, "not balanced" ; head nods/turns guarded and discontinuous  VOR Cancellation: Comment: no sx; trouble coordinating movement   Head-Impulse Test: HIT Right: slight positive  HIT Left: negative      POSITIONAL TESTING:  Right Roll Test: negative; c/o mild dizziness with movement, improved with holding position Left Roll Test: c/o mild dizziness with movement, improved with holding position Right Sidelying: negative; c/o very mild dizziness upon sitting up  Left Sidelying: negative; c/o very mild dizziness upon sitting up     VESTIBULAR TREATMENT:                                                                                                   DATE: 12/13/21   PATIENT EDUCATION: Education details: prognosis, POC, HEP, exam findings  Person educated: Patient and Spouse Education method: Explanation, Demonstration, Tactile cues, Verbal cues, and Handouts Education comprehension: verbalized understanding and returned demonstration  HOME EXERCISE PROGRAM: Access Code: [email or web address] URL: [email or web address] Date: 12/13/2021 Prepared by: [facility]  Exercises - Seated Left Head Turns Vestibular Habituation  - 1 x daily - 5 x weekly - 2-3 sets - 30 sec hold - Seated Head Nods Vestibular Habituation  - 1 x daily - 5 x weekly - 2-3 sets - 30 sec hold - Narrow Stance with Counter Support  - 1 x daily - 5 x weekly - 2-3 sets - 30 sec hold   GOALS: Goals reviewed with patient? Yes  SHORT TERM GOALS: Target date: 01/03/2022  Patient to be independent with initial HEP. Baseline: HEP initiated Goal status: MET    LONG TERM GOALS:  Target date: 03/17/2022  Patient to be independent with advanced HEP. Baseline: Not yet initiated; met for current 02/03/22 Goal status: IN PROGRESS 02/03/22  Patient to report 0/10 dizziness with standing vertical and horizontal VOR for 30 seconds. Baseline: Unable; 0-5/10 dizziness 02/03/22 Goal status: IN PROGRESS 02/03/22  Patient will report 0/10 dizziness with bed mobility.  Baseline: Symptomatic ; 3-4/10 dizziness upon sitting up 02/03/22 Goal status: IN PROGRESS 02/03/22  Patient to demonstrate mild sway with M-CTSIB condition with eyes closed/FIRM surface for 30 sec in order to improve safety in environments with uneven surfaces and dim lighting. Baseline: 3 sec before LOB; MET 02/03/22 Goal status: MET 02/03/22  Patient to score at least 20/24 on DGI in order to decrease risk of falls. Baseline: 13, 16 02/03/22 Goal status: IN PROGRESS 02/03/22  Patient to demonstrate cervical AROM WFL and without pain limiting in order to improve ability to scan environment. Baseline:  Active ROM A/PROM (deg) eval AROM (deg) 02/03/22  Flexion 25 28  Extension 18 26  Right lateral flexion 25 15  Left lateral flexion 21 30  Right rotation 37 40 *head N/T  Left rotation 51 49 *head N/T   (Blank rows = not tested) Goal status: MET 02/03/22  Patient to score at least 55 on FOTO in order to indicate improved functional outcomes.  Baseline: 48; 45.0769 02/03/22 Goal status: IN PROGRESS 02/03/22    ASSESSMENT:  CLINICAL IMPRESSION: Patient arrived to session without new complaints. Reviewed and updated HEP for max benefits and agreed on D/C on 2/20/2. Patient demonstrated slightly improved coordination with gait with diagonal head movements, however still with some reduced step length and height evident. Trialed  turns with good speed evident, however with some imbalance and patient with report of wooziness. Patient with c/o L LE "not working right" thus initiated gentle ankle strengthening. Patient  reported understanding of all edu provided and without complaints at end of session.      OBJECTIVE IMPAIRMENTS: Abnormal gait, decreased balance, decreased ROM, dizziness, impaired flexibility, improper body mechanics, postural dysfunction, and pain.   ACTIVITY LIMITATIONS: carrying, lifting, bending, sitting, standing, squatting, sleeping, stairs, transfers, bed mobility, bathing, dressing, reach over head, and caring for others  PARTICIPATION LIMITATIONS: meal prep, cleaning, laundry, shopping, community activity, and church  PERSONAL FACTORS: Age, Fitness, Past/current experiences, Time since onset of injury/illness/exacerbation, and 3+ comorbidities: Anemia, anxiety, asthma, L breast CA s/p lumpectomy, GERD, HLD, HTN, osteoporosis, polymyalgia rheumatica  are also affecting patient's functional outcome.   REHAB POTENTIAL: Good  CLINICAL DECISION MAKING: Evolving/moderate complexity  EVALUATION COMPLEXITY: Moderate   PLAN:  PT FREQUENCY: 1x/week  PT DURATION: 6 weeks  PLANNED INTERVENTIONS: Therapeutic exercises, Therapeutic activity, Neuromuscular re-education, Balance training, Gait training, Patient/Family education, Self Care, Joint mobilization, Stair training, Vestibular training, Canalith repositioning, DME instructions, Aquatic Therapy, Dry Needling, Electrical stimulation, Cryotherapy, Moist heat, Taping, Manual therapy, and Re-evaluation  PLAN FOR NEXT SESSION: work on habituating head extension motions; cervical strengthening and motor control   Janene Harvey, PT, DPT 03/14/22 5:17 PM  [facility] at [facility] [address] [place], [place] [address] Phone # [phone] Fax # [phone]

## 2022-03-15 ENCOUNTER — Encounter: Payer: Self-pay | Admitting: Physical Therapy

## 2022-03-15 ENCOUNTER — Ambulatory Visit: Payer: Medicare Other | Admitting: Physical Therapy

## 2022-03-15 DIAGNOSIS — M542 Cervicalgia: Secondary | ICD-10-CM

## 2022-03-15 DIAGNOSIS — R42 Dizziness and giddiness: Secondary | ICD-10-CM

## 2022-03-15 DIAGNOSIS — R2681 Unsteadiness on feet: Secondary | ICD-10-CM

## 2022-03-22 ENCOUNTER — Encounter: Payer: Medicare Other | Admitting: Physical Therapy

## 2022-04-04 ENCOUNTER — Other Ambulatory Visit: Payer: Self-pay | Admitting: Allergy & Immunology

## 2022-04-08 ENCOUNTER — Ambulatory Visit
Admission: RE | Admit: 2022-04-08 | Discharge: 2022-04-08 | Disposition: A | Discharge status: Home / Self Care | Payer: Medicare Other | Source: Ambulatory Visit | Attending: Internal Medicine | Admitting: Internal Medicine

## 2022-04-08 DIAGNOSIS — M8589 Other specified disorders of bone density and structure, multiple sites: Secondary | ICD-10-CM | POA: Diagnosis not present

## 2022-04-08 DIAGNOSIS — Z780 Asymptomatic menopausal state: Secondary | ICD-10-CM | POA: Diagnosis not present

## 2022-04-08 DIAGNOSIS — M810 Age-related osteoporosis without current pathological fracture: Secondary | ICD-10-CM

## 2022-04-09 ENCOUNTER — Other Ambulatory Visit: Payer: Self-pay | Admitting: Allergy & Immunology

## 2022-04-14 DIAGNOSIS — M25551 Pain in right hip: Secondary | ICD-10-CM | POA: Diagnosis not present

## 2022-04-14 DIAGNOSIS — M353 Polymyalgia rheumatica: Secondary | ICD-10-CM | POA: Diagnosis not present

## 2022-04-14 DIAGNOSIS — Z6821 Body mass index (BMI) 21.0-21.9, adult: Secondary | ICD-10-CM | POA: Diagnosis not present

## 2022-04-14 DIAGNOSIS — M654 Radial styloid tenosynovitis [de Quervain]: Secondary | ICD-10-CM | POA: Diagnosis not present

## 2022-04-14 DIAGNOSIS — M5030 Other cervical disc degeneration, unspecified cervical region: Secondary | ICD-10-CM | POA: Diagnosis not present

## 2022-04-14 DIAGNOSIS — M810 Age-related osteoporosis without current pathological fracture: Secondary | ICD-10-CM | POA: Diagnosis not present

## 2022-04-14 DIAGNOSIS — M1991 Primary osteoarthritis, unspecified site: Secondary | ICD-10-CM | POA: Diagnosis not present

## 2022-04-19 DIAGNOSIS — U099 Post covid-19 condition, unspecified: Secondary | ICD-10-CM | POA: Insufficient documentation

## 2022-04-19 HISTORY — DX: Post covid-19 condition, unspecified: U09.9

## 2022-04-19 NOTE — Assessment & Plan Note (Signed)
She is getting better so will monitor

## 2022-04-19 NOTE — Assessment & Plan Note (Signed)
She will gradually increase acticity and drink plenty of fluid, if not better then will re-evaluate.

## 2022-04-20 ENCOUNTER — Telehealth: Payer: Self-pay | Admitting: Cardiovascular Disease

## 2022-04-20 NOTE — Telephone Encounter (Signed)
Pt c/o medication issue:  1. Name of Medication: Evolocumab (REPATHA SURECLICK) [id number] MG/ML SOAJ   2. How are you currently taking this medication (dosage and times per day)? Inject 140 mg into the skin every 14 (fourteen) days.   3. Are you having a reaction (difficulty breathing--STAT)? No  4. What is your medication issue? Pt called stating that the needle for the applicator is hurting her when she goes to inject the medication in her skin.

## 2022-04-20 NOTE — Telephone Encounter (Signed)
Praluent and Repatha have the same prefilled pen injector.  Her insurance prefers Repatha and does not cover Praluent well this year.   Spoke with pt, she states the needle seems different on Repatha and isn't going in well. Advised pt to try giving next injection in her thigh and see if it's more comfortable. Could consider once monthly Repatha formulation potentially. She'll call with an update after next injection.

## 2022-05-02 ENCOUNTER — Other Ambulatory Visit (HOSPITAL_BASED_OUTPATIENT_CLINIC_OR_DEPARTMENT_OTHER): Payer: Self-pay

## 2022-05-02 ENCOUNTER — Encounter (HOSPITAL_BASED_OUTPATIENT_CLINIC_OR_DEPARTMENT_OTHER): Payer: Self-pay

## 2022-05-02 ENCOUNTER — Emergency Department (HOSPITAL_BASED_OUTPATIENT_CLINIC_OR_DEPARTMENT_OTHER)
Admission: EM | Admit: 2022-05-02 | Discharge: 2022-05-02 | Disposition: A | Discharge status: Home / Self Care | Payer: Medicare Other | Attending: Emergency Medicine | Admitting: Emergency Medicine

## 2022-05-02 ENCOUNTER — Emergency Department (HOSPITAL_BASED_OUTPATIENT_CLINIC_OR_DEPARTMENT_OTHER): Payer: Medicare Other

## 2022-05-02 DIAGNOSIS — Z7982 Long term (current) use of aspirin: Secondary | ICD-10-CM | POA: Insufficient documentation

## 2022-05-02 DIAGNOSIS — I6523 Occlusion and stenosis of bilateral carotid arteries: Secondary | ICD-10-CM | POA: Diagnosis not present

## 2022-05-02 DIAGNOSIS — R42 Dizziness and giddiness: Secondary | ICD-10-CM | POA: Diagnosis not present

## 2022-05-02 DIAGNOSIS — I672 Cerebral atherosclerosis: Secondary | ICD-10-CM | POA: Diagnosis not present

## 2022-05-02 DIAGNOSIS — I771 Stricture of artery: Secondary | ICD-10-CM | POA: Diagnosis not present

## 2022-05-02 LAB — I-STAT VENOUS BLOOD GAS, ED
Acid-Base Excess: 2 mmol/L (ref 0.0–2.0)
Bicarbonate: 28.8 mmol/L — ABNORMAL HIGH (ref 20.0–28.0)
Calcium, Ion: 1.08 mmol/L — ABNORMAL LOW (ref 1.15–1.40)
HCT: 49 % — ABNORMAL HIGH (ref 36.0–46.0)
Hemoglobin: 16.7 g/dL — ABNORMAL HIGH (ref 12.0–15.0)
O2 Saturation: 58 %
Patient temperature: 98.3
Potassium: 4.4 mmol/L (ref 3.5–5.1)
Sodium: 138 mmol/L (ref 135–145)
TCO2: 30 mmol/L (ref 22–32)
pCO2, Ven: 49.8 mmHg (ref 44–60)
pH, Ven: 7.369 (ref 7.25–7.43)
pO2, Ven: 31 mmHg — CL (ref 32–45)

## 2022-05-02 LAB — COMPREHENSIVE METABOLIC PANEL
ALT: 14 U/L (ref 0–44)
AST: 30 U/L (ref 15–41)
Albumin: 5.3 g/dL — ABNORMAL HIGH (ref 3.5–5.0)
Alkaline Phosphatase: 63 U/L (ref 38–126)
Anion gap: 14 (ref 5–15)
BUN: 17 mg/dL (ref 8–23)
CO2: 24 mmol/L (ref 22–32)
Calcium: 11 mg/dL — ABNORMAL HIGH (ref 8.9–10.3)
Chloride: 95 mmol/L — ABNORMAL LOW (ref 98–111)
Creatinine, Ser: 0.89 mg/dL (ref 0.44–1.00)
GFR, Estimated: >60 mL/min (ref >60)
Glucose, Bld: 96 mg/dL (ref 70–99)
Potassium: 4.3 mmol/L (ref 3.5–5.1)
Sodium: 133 mmol/L — ABNORMAL LOW (ref 135–145)
Total Bilirubin: 0.7 mg/dL (ref 0.3–1.2)
Total Protein: 8.5 g/dL — ABNORMAL HIGH (ref 6.5–8.1)

## 2022-05-02 LAB — CBC WITH DIFFERENTIAL/PLATELET
Abs Immature Granulocytes: 0.03 10*3/uL (ref 0.00–0.07)
Basophils Absolute: 0 10*3/uL (ref 0.0–0.1)
Basophils Relative: 1 %
Eosinophils Absolute: 0.1 10*3/uL (ref 0.0–0.5)
Eosinophils Relative: 2 %
HCT: 43.1 % (ref 36.0–46.0)
Hemoglobin: 15 g/dL (ref 12.0–15.0)
Immature Granulocytes: 1 %
Lymphocytes Relative: 25 %
Lymphs Abs: 1.6 10*3/uL (ref 0.7–4.0)
MCH: 33.6 pg (ref 26.0–34.0)
MCHC: 34.8 g/dL (ref 30.0–36.0)
MCV: 96.6 fL (ref 80.0–100.0)
Monocytes Absolute: 0.8 10*3/uL (ref 0.1–1.0)
Monocytes Relative: 12 %
Neutro Abs: 3.8 10*3/uL (ref 1.7–7.7)
Neutrophils Relative %: 59 %
Platelets: 274 10*3/uL (ref 150–400)
RBC: 4.46 MIL/uL (ref 3.87–5.11)
RDW: 12.7 % (ref 11.5–15.5)
WBC: 6.3 10*3/uL (ref 4.0–10.5)
nRBC: 0 % (ref 0.0–0.2)

## 2022-05-02 LAB — CBG MONITORING, ED: Glucose-Capillary: 106 mg/dL — ABNORMAL HIGH (ref 70–99)

## 2022-05-02 MED ORDER — IOHEXOL 350 MG/ML SOLN
100.0000 mL | Freq: Once | INTRAVENOUS | Status: AC | PRN
  Administered 2022-05-02: 75 mL via INTRAVENOUS

## 2022-05-02 MED ORDER — MECLIZINE HCL 25 MG PO TABS
25.0000 mg | ORAL_TABLET | Freq: Once | ORAL | Status: AC
  Administered 2022-05-02: 25 mg via ORAL
  Filled 2022-05-02: qty 1

## 2022-05-02 NOTE — Discharge Instructions (Signed)
The emergency department for your vertigo.  Your workup showed no signs of stroke or abnormality within your blood vessels that could be causing your symptoms and your labs were normal.  You can follow-up with ENT for your recurrent vertigo to determine the best treatment plan for you to help improve your symptoms and help prevent recurrent episodes.  You should return to the emergency department if you are having numbness or weakness on one side of the body compared to the other, you have severe headaches, or if you have any other new or concerning symptoms.

## 2022-05-02 NOTE — ED Provider Notes (Signed)
[place] EMERGENCY DEPARTMENT AT [facility] Provider Note   CSN: [id number] Arrival date & time: 05/02/22  1111     History  Chief Complaint  Patient presents with   Numbness    R head/ face   Dizziness    Amanda Cannon is a 77 y.o. female.  Patient is a 77 year old female with a past medical history of recurrent vertigo presenting to the emergency department with vertiginous symptoms.  The patient states that she woke up this morning with room spinning dizziness.  She states that she feels like there is "water on the left side of her head and hears a whooshing sound in her ear.  She states that she is chronic paresthesias on the left side of her face.  She denies any new numbness or weakness, nausea or vomiting.  She states that her symptoms are more severe than usual that prompted her to come to the ER.  The history is provided by the patient and the spouse.  Dizziness      Home Medications Prior to Admission medications   Medication Sig Start Date End Date Taking? Authorizing Provider  PRALUENT 150 MG/ML SOAJ Inject into the skin. 02/27/22  Yes [provider]  amLODipine (NORVASC) 5 MG tablet TAKE 1 TABLET BY MOUTH EVERY DAY 05/29/21   Wynn Banker, MD  aspirin 81 MG tablet Take 81 mg by mouth daily.    [provider]  cetirizine (ZYRTEC) 10 MG tablet Take 1 tablet (10 mg total) by mouth 2 (two) times daily. 02/18/21   Alfonse Spruce, MD  Cholecalciferol 25 MCG (1000 UT) tablet Take by mouth.    [provider]  denosumab (PROLIA) 60 MG/ML SOLN injection Inject 60 mg into the skin every 6 (six) months. Administer in upper arm, thigh, or abdomen    [provider]  Evolocumab (REPATHA SURECLICK) 140 MG/ML SOAJ Inject 140 mg into the skin every 14 (fourteen) days. 01/25/22   Lyn Records, MD  famotidine (PEPCID) 40 MG tablet TAKE 1 TABLET BY MOUTH EVERYDAY AT BEDTIME 04/11/22   Alfonse Spruce, MD  fluticasone  [facility]) 50 MCG/ACT nasal spray SPRAY 2 SPRAYS INTO EACH NOSTRIL EVERY DAY 04/04/22   Alfonse Spruce, MD  furosemide (LASIX) 20 MG tablet Take 20 mg by mouth daily.    [provider]  latanoprost (XALATAN) 0.005 % ophthalmic solution 1 drop at bedtime. 12/09/21   [provider]  LORazepam (ATIVAN) 1 MG tablet Take 1 mg by mouth at bedtime as needed (spasms).    [provider]  magnesium oxide (MAG-OX) 400 MG tablet Take by mouth.    [provider]  naproxen sodium (ALEVE) 220 MG tablet 1 tablet as needed    [provider]      Allergies    Tylenol [acetaminophen], Turmeric, Betamethasone, Erythromycin, Erythromycin base, Other, and Sulfa antibiotics    Review of Systems   Review of Systems  Neurological:  Positive for dizziness.    Physical Exam Updated Vital Signs BP (!) 107/94   Pulse 89   Temp 98.3 F (36.8 C) (Oral)   Resp (!) 21   SpO2 100%  Physical Exam Vitals and nursing note reviewed.  Constitutional:      General: She is not in acute distress.    Appearance: Normal appearance.  HENT:     Head: Normocephalic and atraumatic.     Right Ear: Tympanic membrane, ear canal and external ear normal.  Left Ear: Tympanic membrane, ear canal and external ear normal.     Nose: Nose normal.     Mouth/Throat:     Mouth: Mucous membranes are moist.     Pharynx: Oropharynx is clear.  Eyes:     Extraocular Movements: Extraocular movements intact.     Conjunctiva/sclera: Conjunctivae normal.     Pupils: Pupils are equal, round, and reactive to light.     Comments: No nystagmus  Cardiovascular:     Rate and Rhythm: Normal rate and regular rhythm.     Pulses: Normal pulses.     Heart sounds: Normal heart sounds.  Pulmonary:     Effort: Pulmonary effort is normal.     Breath sounds: Normal breath sounds.  Abdominal:     General: Abdomen is flat.     Palpations: Abdomen is soft.     Tenderness: There is no abdominal  tenderness.  Musculoskeletal:        General: Normal range of motion.     Cervical back: Normal range of motion and neck supple.  Skin:    General: Skin is warm and dry.  Neurological:     General: No focal deficit present.     Mental Status: She is alert and oriented to person, place, and time.     Cranial Nerves: No cranial nerve deficit.     Sensory: No sensory deficit.     Motor: No weakness.     Coordination: Coordination normal.  Psychiatric:        Mood and Affect: Mood normal.        Behavior: Behavior normal.     ED Results / Procedures / Treatments   Labs (all labs ordered are listed, but only abnormal results are displayed) Labs Reviewed  COMPREHENSIVE METABOLIC PANEL - Abnormal; Notable for the following components:      Result Value   Sodium 133 (*)    Chloride 95 (*)    Calcium 11.0 (*)    Total Protein 8.5 (*)    Albumin 5.3 (*)    All other components within normal limits  CBG MONITORING, ED - Abnormal; Notable for the following components:   Glucose-Capillary 106 (*)    All other components within normal limits  I-STAT VENOUS BLOOD GAS, ED - Abnormal; Notable for the following components:   pO2, Ven 31 (*)    Bicarbonate 28.8 (*)    Calcium, Ion 1.08 (*)    HCT 49.0 (*)    Hemoglobin 16.7 (*)    All other components within normal limits  CBC WITH DIFFERENTIAL/PLATELET  CBC WITH DIFFERENTIAL/PLATELET    EKG EKG Interpretation  Date/Time:  Monday May 02 2022 11:28:06 EDT Ventricular Rate:  92 PR Interval:  142 QRS Duration: 68 QT Interval:  356 QTC Calculation: 440 R Axis:   32 Text Interpretation: Normal sinus rhythm Nonspecific ST and T wave abnormality Abnormal ECG No significant change since last tracing Confirmed by Elayne Snare ([id number]) on 05/02/2022 1:28:06 PM  Radiology CT Angio Head Neck W WO CM  Result Date: 05/02/2022 CLINICAL DATA:  Neuro deficit, acute, stroke suspected. Vestibular disturbance. Right head and face numbness.  EXAM: CT ANGIOGRAPHY HEAD AND NECK TECHNIQUE: Multidetector CT imaging of the head and neck was performed using the standard protocol during bolus administration of intravenous contrast. Multiplanar CT image reconstructions and MIPs were obtained to evaluate the vascular anatomy. Carotid stenosis measurements (when applicable) are obtained utilizing NASCET criteria, using the distal internal carotid diameter as the denominator.  RADIATION DOSE REDUCTION: This exam was performed according to the departmental dose-optimization program which includes automated exposure control, adjustment of the mA and/or kV according to patient size and/or use of iterative reconstruction technique. CONTRAST:  75mL OMNIPAQUE IOHEXOL 350 MG/ML SOLN COMPARISON:  MRI 01/22/2020 FINDINGS: CT HEAD FINDINGS Brain: No focal abnormality is seen affecting the brainstem or cerebellum. Cerebral hemispheres show mild to moderate chronic small-vessel ischemic changes of the white matter. No sign of acute infarction, mass lesion, hemorrhage, hydrocephalus or extra-axial collection. Vascular: There is atherosclerotic calcification of the major vessels at the base of the brain. Skull: Negative Sinuses/Orbits: Clear/normal Other: None Review of the MIP images confirms the above findings CTA NECK FINDINGS Aortic arch: Aortic atherosclerosis. Mild to moderate stenosis of the left subclavian artery origin. Right carotid system: Common carotid artery is tortuous but widely patent to the bifurcation. Carotid bifurcation shows minimal plaque but no stenosis. Cervical ICA is tortuous but widely patent. Left carotid system: Common carotid artery is tortuous but widely patent to the bifurcation. There is calcified plaque at the carotid bifurcation and ICA bulb. 20% stenosis in the ICA bulb. No stenosis beyond that. Vertebral arteries: As noted above, there is mild to moderate stenosis of the proximal left subclavian artery. This is not well characterized because  of chest motion. Both vertebral artery origins are widely patent. Both vertebral arteries are patent through the cervical region to the foramen magnum. Skeleton: Cervical spondylosis and facet osteoarthritis. Other neck: No mass or lymphadenopathy. Upper chest: Mild scarring at the lung apices. Review of the MIP images confirms the above findings CTA HEAD FINDINGS Anterior circulation: Both internal carotid arteries are patent through the skull base and siphon regions. No siphon stenosis. The anterior and middle cerebral vessels are patent. No large vessel occlusion or correctable proximal stenosis. Both anterior cerebral arteries receive there supply from the left carotid circulation. Aplastic A1 segment on the right. Fetal origin of the right PCA. Posterior circulation: Both vertebral arteries are patent to the basilar artery. No basilar stenosis. Posterior circulation branch vessels are normal. Venous sinuses: Patent and normal. Anatomic variants: None other significant. Review of the MIP images confirms the above findings IMPRESSION: 1. No acute head CT finding. Chronic small-vessel ischemic changes of the white matter. 2. No intracranial large vessel occlusion or correctable proximal stenosis. 3. Atherosclerotic disease at the left carotid bifurcation and ICA bulb. 20% stenosis in the ICA bulb. No stenosis on the right. 4. Mild to moderate stenosis of the proximal left subclavian artery. This is not well characterized because of chest motion. 5. Aortic atherosclerosis. Aortic Atherosclerosis (ICD10-I70.0). Electronically Signed   By: Paulina Fusi M.D.   On: 05/02/2022 13:09    Procedures Procedures    Medications Ordered in ED Medications  iohexol (OMNIPAQUE) 350 MG/ML injection 100 mL (75 mLs Intravenous Contrast Given 05/02/22 1239)  meclizine (ANTIVERT) tablet 25 mg (25 mg Oral Given 05/02/22 1452)    ED Course/ Medical Decision Making/ A&P                             Medical Decision  Making This patient presents to the ED with chief complaint(s) of dizziness with pertinent past medical history of vertigo, hypertension which further complicates the presenting complaint. The complaint involves an extensive differential diagnosis and also carries with it a high risk of complications and morbidity.    The differential diagnosis includes central versus peripheral vertigo, arrhythmia, anemia, electrolyte  abnormality, dehydration  Additional history obtained: Additional history obtained from spouse Records reviewed prior MRI and CT imaging  ED Course and Reassessment: On patient's arrival to the emergency department she is well-appearing in no acute distress with no focal neurologic deficits without any signs of obvious stroke.  Due to patient's age with her dizziness, she will have a CTA of her head and neck to evaluate for potential central etiology of her vertigo as well as have EKG and labs performed.  She has no reproducible vertiginous symptoms on exam making BPPV less likely.  The patient was offered meclizine however refused because of "side effects".  Independent labs interpretation:  The following labs were independently interpreted: Within normal range  Independent visualization of imaging: - I independently visualized the following imaging with scope of interpretation limited to determining acute life threatening conditions related to emergency care: CTA head and neck, which revealed no acute abnormalities  Consultation: - Consulted or discussed management/test interpretation w/ external professional: N/A  Consideration for admission or further workup: Patient has no emergent conditions requiring admission or further work-up at this time and is stable for discharge home with primary care and ENT follow-up  Social Determinants of health: N/A    Amount and/or Complexity of Data Reviewed Labs: ordered. Radiology: ordered.  Risk Prescription drug  management.          Final Clinical Impression(s) / ED Diagnoses Final diagnoses:  Vertigo    Rx / DC Orders ED Discharge Orders     None         Rexford MausKingsley, Mckinna Demars K, DO 05/02/22 1548

## 2022-05-02 NOTE — ED Triage Notes (Signed)
Pt ambulatory to triage w visitor, no acute deficit noted in triage.

## 2022-05-02 NOTE — ED Triage Notes (Signed)
Pt c/o "vestibular problem x a long time- 5yrs, no one's been able to help me." States that today, R side of face/ head has felt numb, "really bad, trouble balancing, head leaning towards the R." States hearing, voice affected. Denies HA, endorses L side blurred vision, L hearing difficulty- "I feel like my head is underwater."

## 2022-05-03 ENCOUNTER — Telehealth: Payer: Self-pay | Admitting: Pharmacist

## 2022-05-03 ENCOUNTER — Ambulatory Visit (HOSPITAL_BASED_OUTPATIENT_CLINIC_OR_DEPARTMENT_OTHER): Payer: Medicare Other | Admitting: Cardiovascular Disease

## 2022-05-03 ENCOUNTER — Ambulatory Visit: Payer: Medicare Other | Admitting: Internal Medicine

## 2022-05-03 ENCOUNTER — Encounter: Payer: Self-pay | Admitting: Internal Medicine

## 2022-05-03 VITALS — BP 110/70 | HR 84 | Temp 97.3°F | Resp 18 | Ht <= 58 in | Wt 98.2 lb

## 2022-05-03 DIAGNOSIS — H8192 Unspecified disorder of vestibular function, left ear: Secondary | ICD-10-CM | POA: Diagnosis not present

## 2022-05-03 DIAGNOSIS — M810 Age-related osteoporosis without current pathological fracture: Secondary | ICD-10-CM | POA: Diagnosis not present

## 2022-05-03 DIAGNOSIS — I10 Essential (primary) hypertension: Secondary | ICD-10-CM

## 2022-05-03 HISTORY — DX: Hypercalcemia: E83.52

## 2022-05-03 MED ORDER — MECLIZINE HCL 25 MG PO TABS: 25.0000 mg | ORAL_TABLET | Freq: Three times a day (TID) | ORAL | 2 refills | Status: DC | PRN

## 2022-05-03 NOTE — Assessment & Plan Note (Signed)
stable °

## 2022-05-03 NOTE — Assessment & Plan Note (Signed)
Calcium level was 11 yesterday in the emergency room and she says that she had that before so I will reevaluate and repeat a calcium level on next visit.

## 2022-05-03 NOTE — Telephone Encounter (Signed)
Pt c/o medication issue:  1. Name of Medication:   Evolocumab (REPATHA SURECLICK) 140 MG/ML SOAJ    2. How are you currently taking this medication (dosage and times per day)? As written  3. Are you having a reaction (difficulty breathing--STAT)? no  4. What is your medication issue?  Pt states the needle click hurts her really bad and would like to go back Praulent. Please advise.

## 2022-05-03 NOTE — Assessment & Plan Note (Signed)
Meclizine did help her so I will send that prescription for her.  Will continue to monitor.

## 2022-05-03 NOTE — Assessment & Plan Note (Signed)
She is prolia.

## 2022-05-03 NOTE — Progress Notes (Signed)
Office Visit  Subjective   Patient ID: Amanda Cannon   DOB: 1945/11/10   Age: 77 y.o.   MRN: [id number]   Chief Complaint Chief Complaint  Patient presents with   Follow-up    ER follow up     History of Present Illness 77 years old female who is here for follow-up from emergency room visit.  She says that yesterday her left side face was numb and she felt that her head is underwater.  She got worried and went to the emergency room where workup including CT scan head, CTA head and neck did not show any acute pathology.  She has a history of vestibular disorder and she was given meclizine and she says that her symptoms resolved.  She says that she was seen by ENT here and he referred her to [facility] where she was seen last year and they said that nothing can be done about her vestibular problem.  She also received vestibular rehab that did help.   She has a history of hypertension and her blood pressure is controlled.  Past Medical History Past Medical History:  Diagnosis Date   Alcohol use    Anemia    Anxiety    Asthma    Basal cell carcinoma    forehead   Breast cancer 1998   left    Colon polyps    GERD (gastroesophageal reflux disease)    Glaucoma    Hyperlipidemia    Hypertension    Hyponatremia    remote, mild   Mitral valve prolapse    Osteoarthritis    sees rheumatologist   Osteoporosis    sees rheumatologist   Personal history of radiation therapy 1998   PMR (polymyalgia rheumatica)    sees rheumatologist   Pneumonia    Prinzmetal angina    White matter changes    ? ischemic sm vs per pt     Allergies Allergies  Allergen Reactions   Tylenol [Acetaminophen] Cough   Turmeric     Lowers BP   Betamethasone     Other reaction(s): Not available   Erythromycin     Stomach cramps   Erythromycin Base Other (See Comments)    Stomach issues   Other     Beta-blockers-patient states it makes her crazy   Sulfa Antibiotics Other (See Comments)    Doesn't  recall     Review of Systems Review of Systems  Constitutional: Negative.   HENT: Negative.    Respiratory: Negative.    Cardiovascular: Negative.   Gastrointestinal: Negative.   Neurological: Negative.        Objective:    Vitals BP 110/70 (BP Location: Left Arm, Patient Position: Sitting, Cuff Size: Normal)   Pulse 84   Temp (!) 97.3 F (36.3 C)   Resp 18   Ht 4\' 8"  (1.422 m)   Wt 98 lb 4 oz (44.6 kg)   SpO2 99%   BMI 22.03 kg/m    Physical Examination Physical Exam Constitutional:      Appearance: Normal appearance.  Cardiovascular:     Rate and Rhythm: Normal rate and regular rhythm.     Heart sounds: Normal heart sounds.  Pulmonary:     Effort: Pulmonary effort is normal.     Breath sounds: Normal breath sounds.  Abdominal:     General: Bowel sounds are normal.     Palpations: Abdomen is soft.  Neurological:     General: No focal deficit present.  Mental Status: She is alert and oriented to person, place, and time.        Assessment & Plan:   Essential hypertension stable  Vestibular disorder Meclizine did help her so I will send that prescription for her.  Will continue to monitor.  Osteoporosis She is prolia.   Hypercalcemia Calcium level was 11 yesterday in the emergency room and she says that she had that before so I will reevaluate and repeat a calcium level on next visit.    Return in about 2 months (around 07/03/2022).   Eloisa Northern, MD

## 2022-05-04 NOTE — Telephone Encounter (Signed)
Spoke to patient. As her insurance prefer [employer] agent is now [place],only incase of major intolerance or anaphylactic reaction from  their preferred option they (majority insurances) would consider covering alternative.  However, patient has supplemental insurance for part B medications so Leqvio would be reasonable next option to consider. Patient is in agreement to start Leqvio coverage determination process.   Will initiate paper work for [employer] using portal.

## 2022-05-06 ENCOUNTER — Other Ambulatory Visit: Payer: Self-pay | Admitting: Pharmacist

## 2022-05-06 ENCOUNTER — Telehealth: Payer: Self-pay | Admitting: Pharmacy Technician

## 2022-05-06 NOTE — Telephone Encounter (Signed)
Amanda Cannon note:  Patient will be scheduled as soon as possile.  Auth Submission: NO AUTH NEEDED Site of care: Site of care: [facility] Payer: medicare a/b & aarp supp Medication & CPT/J Code(s) submitted: Leqvio (Inclisiran) [id number] Route of submission (phone, fax, portal):  Phone # Fax # Auth type: Buy/Bill Units/visits requested: 2 Reference number:  Approval from: 05/06/22 to 01/24/23     Medicare will over 80% and AARP supp will pick-up remaining 20%

## 2022-05-11 NOTE — Addendum Note (Signed)
Addended by: Tylene Fantasia on: 05/11/2022 04:13 PM   Modules accepted: Orders

## 2022-05-11 NOTE — Telephone Encounter (Signed)
Call patient to inform about approval and follow up lab in 4 months of starting Leqvio. Patient states she does not have time to understand all these as she is right now involved in party planning. Requesting call back tomorrow.

## 2022-05-12 NOTE — Telephone Encounter (Signed)
Pt states that she is returning a call she received yesterday from the Pharmacist. Please advise

## 2022-05-13 DIAGNOSIS — Z1231 Encounter for screening mammogram for malignant neoplasm of breast: Secondary | ICD-10-CM | POA: Diagnosis not present

## 2022-05-13 DIAGNOSIS — Z9189 Other specified personal risk factors, not elsewhere classified: Secondary | ICD-10-CM | POA: Diagnosis not present

## 2022-05-13 DIAGNOSIS — Z9229 Personal history of other drug therapy: Secondary | ICD-10-CM | POA: Diagnosis not present

## 2022-05-13 DIAGNOSIS — C50512 Malignant neoplasm of lower-outer quadrant of left female breast: Secondary | ICD-10-CM | POA: Diagnosis not present

## 2022-05-13 DIAGNOSIS — Z170 Estrogen receptor positive status [ER+]: Secondary | ICD-10-CM | POA: Diagnosis not present

## 2022-05-13 DIAGNOSIS — M353 Polymyalgia rheumatica: Secondary | ICD-10-CM | POA: Diagnosis not present

## 2022-05-13 DIAGNOSIS — M810 Age-related osteoporosis without current pathological fracture: Secondary | ICD-10-CM | POA: Diagnosis not present

## 2022-05-17 ENCOUNTER — Ambulatory Visit (INDEPENDENT_AMBULATORY_CARE_PROVIDER_SITE_OTHER): Payer: Medicare Other | Admitting: Cardiovascular Disease

## 2022-05-17 ENCOUNTER — Encounter (HOSPITAL_BASED_OUTPATIENT_CLINIC_OR_DEPARTMENT_OTHER): Payer: Self-pay | Admitting: Cardiovascular Disease

## 2022-05-17 VITALS — BP 128/80 | HR 76 | Ht <= 58 in | Wt 98.4 lb

## 2022-05-17 DIAGNOSIS — E7849 Other hyperlipidemia: Secondary | ICD-10-CM

## 2022-05-17 DIAGNOSIS — I201 Angina pectoris with documented spasm: Secondary | ICD-10-CM

## 2022-05-17 DIAGNOSIS — Z5181 Encounter for therapeutic drug level monitoring: Secondary | ICD-10-CM | POA: Diagnosis not present

## 2022-05-17 DIAGNOSIS — I10 Essential (primary) hypertension: Secondary | ICD-10-CM

## 2022-05-17 NOTE — Progress Notes (Signed)
Cardiology Office Note:    Date:  05/17/2022   ID:  [name], DOB 17-Apr-1945, MRN [id number]  PCP:  Eloisa Northern, MD    [facility] Providers Cardiologist:  Chilton Si, MD     Referring MD: Eloisa Northern, MD   No chief complaint on file.   History of Present Illness:    Amanda Cannon is a 77 y.o. female with a hx of hypertension, polymyalgia rheumatica. hyperlipidemia, Prinzmetal's Angina, and less than 39% bilateral carotid obstruction 2018.   During her last visit with Dr. Katrinka Blazing 10/2021 she had been feeling well other than occasional palpitation. She had been staying active with 30 minutes of moderate physical activity 5 days a week.  She had a Bilateral carotid duplex 10/2021 which revealed 1-39% stenosis in the right and left ICA, Antegrade LVA and atypical antegrade RVA.  Today, the patient states that she has been having acid reflux when standing and she wanted to make sure that it wasn't her heart. She is not on Inclisirin yet however she had stopped Repatha. She has been exercising by boxing and yoga three times a week. She also does tai chi two times a week. She complains of exercise induced asthma. She states that her blood pressure has been good at home. Her left leg swells occasionally but she stated it was most likely a prior injury. She denies any palpitations, shortness of breath. No lightheadedness, syncope, orthopnea, or PND.   Past Medical History:  Diagnosis Date   Alcohol use    Anemia    Anxiety    Asthma    Basal cell carcinoma    forehead   Breast cancer 1998   left    Colon polyps    GERD (gastroesophageal reflux disease)    Glaucoma    Hyperlipidemia    Hypertension    Hyponatremia    remote, mild   Mitral valve prolapse    Osteoarthritis    sees rheumatologist   Osteoporosis    sees rheumatologist   Personal history of radiation therapy 1998   PMR (polymyalgia rheumatica)    sees rheumatologist   Pneumonia     Prinzmetal angina    White matter changes    ? ischemic sm vs per pt    Past Surgical History:  Procedure Laterality Date   ABDOMINAL HYSTERECTOMY     BREAST LUMPECTOMY Left 1998   cancer   CESAREAN SECTION     x 4   COLONOSCOPY  07/2012   in [facility]   HERNIA REPAIR  2005   abdominal hernia repair   HYSTEROTOMY     LYMPH NODE DISSECTION Left    POLYPECTOMY      Current Medications: Current Meds  Medication Sig   amLODipine (NORVASC) 5 MG tablet TAKE 1 TABLET BY MOUTH EVERY DAY   aspirin 81 MG tablet Take 81 mg by mouth daily.   cetirizine (ZYRTEC) 10 MG tablet Take 1 tablet (10 mg total) by mouth 2 (two) times daily.   Cholecalciferol 25 MCG (1000 UT) tablet Take by mouth.   denosumab (PROLIA) 60 MG/ML SOLN injection Inject 60 mg into the skin every 6 (six) months. Administer in upper arm, thigh, or abdomen   famotidine (PEPCID) 40 MG tablet TAKE 1 TABLET BY MOUTH EVERYDAY AT BEDTIME   fluticasone (FLONASE) 50 MCG/ACT nasal spray SPRAY 2 SPRAYS INTO EACH NOSTRIL EVERY DAY   furosemide (LASIX) 20 MG tablet Take 20 mg by mouth daily.   inclisiran (LEQVIO) 284  MG/1.5ML SOSY injection Inject 284 mg into the skin once.   latanoprost (XALATAN) 0.005 % ophthalmic solution 1 drop at bedtime.   LORazepam (ATIVAN) 1 MG tablet Take 1 mg by mouth at bedtime as needed (spasms).   magnesium oxide (MAG-OX) 400 MG tablet Take by mouth.   meclizine (ANTIVERT) 25 MG tablet Take 1 tablet (25 mg total) by mouth 3 (three) times daily as needed for dizziness.   naproxen sodium (ALEVE) 220 MG tablet 1 tablet as needed     Allergies:   Tylenol [acetaminophen], Turmeric, Betamethasone, Erythromycin, Erythromycin base, Other, and Sulfa antibiotics   Social History   Socioeconomic History   Marital status: Married    Spouse name: Freida Busman    Number of children: 3   Years of education: 13   Highest education level: Associate degree: occupational, Scientist, product/process development, or vocational program  Occupational History    Occupation: Agricultural consultant  Tobacco Use   Smoking status: Former    Packs/day: 1.00    Years: 7.00    Additional pack years: 0.00    Total pack years: 7.00    Types: Cigarettes    Quit date: 01/25/1971    Years since quitting: 51.3   Smokeless tobacco: Never  Vaping Use   Vaping Use: Never used  Substance and Sexual Activity   Alcohol use: Yes    Alcohol/week: 14.0 standard drinks of alcohol    Types: 14 Standard drinks or equivalent per week    Comment: two drinks with burbon + water daily    Drug use: No   Sexual activity: Yes  Other Topics Concern   Not on file  Social History Narrative   Work or School: retired Teacher, adult education and retired Sales executive Situation: lives with husband      Spiritual Beliefs: Jewish      Lifestyle: active, diet is good      3 children + grandchildren   1 cat    Social Determinants of Health   Financial Resource Strain: Low Risk  (05/31/2021)   Overall Financial Resource Strain (CARDIA)    Difficulty of Paying Living Expenses: Not hard at all  Food Insecurity: No Food Insecurity (05/31/2021)   Hunger Vital Sign    Worried About Running Out of Food in the Last Year: Never true    Ran Out of Food in the Last Year: Never true  Transportation Needs: No Transportation Needs (05/31/2021)   PRAPARE - Administrator, Civil Service (Medical): No    Lack of Transportation (Non-Medical): No  Physical Activity: Sufficiently Active (05/31/2021)   Exercise Vital Sign    Days of Exercise per Week: 5 days    Minutes of Exercise per Session: 40 min  Stress: No Stress Concern Present (05/31/2021)   [employer] of Occupational Health - Occupational Stress Questionnaire    Feeling of Stress : Not at all  Social Connections: Moderately Integrated (05/31/2021)   Social Connection and Isolation Panel [NHANES]    Frequency of Communication with Friends and Family: More than three times a week    Frequency of Social Gatherings  with Friends and Family: More than three times a week    Attends Religious Services: 1 to 4 times per year    Active Member of [employer] or Organizations: No    Attends Engineer, structural: Not on file    Marital Status: Married     Family History: The patient's family history includes Cancer -  Ovarian in her sister; Colon cancer in her cousin; Coronary artery disease (age of onset: 95) in her father; Coronary artery disease (age of onset: 40) in her mother. There is no history of Esophageal cancer, Rectal cancer, or Stomach cancer.  ROS:   Please see the history of present illness.    (+) Chest pain (+) Left leg edema  All other systems reviewed and are negative.  EKGs/Labs/Other Studies Reviewed:    The following studies were reviewed today:  EKG:  EKG is personally reviewed 05/17/2022: EKG was not ordered today.  Recent Labs: 05/02/2022: ALT 14; BUN 17; Creatinine, Ser 0.89; Hemoglobin 16.7; Platelets 274; Potassium 4.4; Sodium 138  Recent Lipid Panel    Component Value Date/Time   CHOL 190 11/26/2021 0753   TRIG 93 11/26/2021 0753   TRIG 174 (H) 06/24/2016 0848   HDL 102 11/26/2021 0753   HDL 74 06/24/2016 0848   CHOLHDL 1.9 11/26/2021 0753   CHOLHDL 2 12/07/2020 1134   VLDL 23.6 12/07/2020 1134   LDLCALC 72 11/26/2021 0753     Risk Assessment/Calculations:                Physical Exam:    VS:  BP 128/80   Pulse 76   Ht 4\' 8"  (1.422 m)   Wt 98 lb 6.4 oz (44.6 kg)   BMI 22.06 kg/m  , BMI Body mass index is 22.06 kg/m. GENERAL:  Well appearing HEENT: Pupils equal round and reactive, fundi not visualized, oral mucosa unremarkable NECK:  No jugular venous distention, waveform within normal limits, carotid upstroke brisk and symmetric, no bruits, no thyromegaly LUNGS:  Clear to auscultation bilaterally HEART:  RRR.  PMI not displaced or sustained,S1 and S2 within normal limits, no S3, no S4, no clicks, no rubs, no murmurs ABD:  Flat, positive bowel  sounds normal in frequency in pitch, no bruits, no rebound, no guarding, no midline pulsatile mass, no hepatomegaly, no splenomegaly EXT:  2 plus pulses throughout, no edema, no cyanosis no clubbing SKIN:  No rashes no nodules NEURO:  Cranial nerves II through XII grossly intact, motor grossly intact throughout PSYCH:  Cognitively intact, oriented to person place and time   ASSESSMENT:    1. Other hyperlipidemia   2. Prinzmetal angina   3. Essential hypertension   4. Therapeutic drug monitoring    PLAN:    Prinzmetal angina (HCC) She reports waking up with chest pain years ago. She has no exertional symptoms and exercises regularly.  Lipids are well-controlled.   Essential hypertension Blood pressure is well-controlled on amlodipine.         Follow up with Bradshaw Minihan C. Duke Salvia, MD, [facility] in 1 year.  Medication Adjustments/Labs and Tests Ordered: Current medicines are reviewed at length with the patient today.  Concerns regarding medicines are outlined above.  Orders Placed This Encounter  Procedures   Lipid panel   Comprehensive metabolic panel   No orders of the defined types were placed in this encounter.   Patient Instructions  Medication Instructions:  Your physician recommends that you continue on your current medications as directed. Please refer to the Current Medication list given to you today.   *If you need a refill on your cardiac medications before your next appointment, please call your pharmacy*  Lab Work: FASTING LP/CMET AROUND JULY 4TH  Testing/Procedures: NONE  Follow-Up: At [facility], you and your health needs are our priority.  As part of our continuing mission to provide you with exceptional  heart care, we have created designated Provider Care Teams.  These Care Teams include your primary Cardiologist (physician) and Advanced Practice Providers (APPs -  Physician Assistants and Nurse Practitioners) who all work together to provide you  with the care you need, when you need it.  We recommend signing up for the patient portal called "MyChart".  Sign up information is provided on this After Visit Summary.  MyChart is used to connect with patients for Virtual Visits (Telemedicine).  Patients are able to view lab/test results, encounter notes, upcoming appointments, etc.  Non-urgent messages can be sent to your provider as well.   To learn more about what you can do with MyChart, go to [email or web address].    Your next appointment:   12 month(s)  Provider:   Chilton Si, MD or Gillian Shields, NP       I,Coren O'Brien,acting as a scribe for Chilton Si, MD.,have documented all relevant documentation on the behalf of Chilton Si, MD,as directed by  Chilton Si, MD while in the presence of Chilton Si, MD.  I, Gerrald Basu C. Duke Salvia, MD have reviewed all documentation for this visit.  The documentation of the exam, diagnosis, procedures, and orders on 05/17/2022 are all accurate and complete.

## 2022-05-17 NOTE — Patient Instructions (Signed)
Medication Instructions:  Your physician recommends that you continue on your current medications as directed. Please refer to the Current Medication list given to you today.   *If you need a refill on your cardiac medications before your next appointment, please call your pharmacy*  Lab Work: FASTING LP/CMET AROUND JULY 4TH  Testing/Procedures: NONE  Follow-Up: At [facility], you and your health needs are our priority.  As part of our continuing mission to provide you with exceptional heart care, we have created designated Provider Care Teams.  These Care Teams include your primary Cardiologist (physician) and Advanced Practice Providers (APPs -  Physician Assistants and Nurse Practitioners) who all work together to provide you with the care you need, when you need it.  We recommend signing up for the patient portal called "MyChart".  Sign up information is provided on this After Visit Summary.  MyChart is used to connect with patients for Virtual Visits (Telemedicine).  Patients are able to view lab/test results, encounter notes, upcoming appointments, etc.  Non-urgent messages can be sent to your provider as well.   To learn more about what you can do with MyChart, go to [email or web address].    Your next appointment:   12 month(s)  Provider:   Chilton Si, MD or Gillian Shields, NP

## 2022-05-17 NOTE — Assessment & Plan Note (Signed)
She reports waking up with chest pain years ago. She has no exertional symptoms and exercises regularly.  Lipids are well-controlled.

## 2022-05-17 NOTE — Assessment & Plan Note (Signed)
Blood pressure is well controlled on amlodipine. 

## 2022-05-20 ENCOUNTER — Telehealth: Payer: Self-pay | Admitting: Cardiovascular Disease

## 2022-05-20 ENCOUNTER — Other Ambulatory Visit: Payer: Self-pay | Admitting: Pharmacist

## 2022-05-20 DIAGNOSIS — E7849 Other hyperlipidemia: Secondary | ICD-10-CM

## 2022-05-20 DIAGNOSIS — M810 Age-related osteoporosis without current pathological fracture: Secondary | ICD-10-CM | POA: Diagnosis not present

## 2022-05-20 MED ORDER — INCLISIRAN SODIUM 284 MG/1.5ML ~~LOC~~ SOSY
284.0000 mg | PREFILLED_SYRINGE | Freq: Once | SUBCUTANEOUS | Status: DC
Start: 2022-05-20 — End: 2023-02-28

## 2022-05-20 NOTE — Telephone Encounter (Signed)
Hi there, looks like you were helping her with this!

## 2022-05-20 NOTE — Telephone Encounter (Signed)
Pt c/o medication issue:  1. Name of Medication:   inclisiran (LEQVIO) 284 MG/1.5ML SOSY injection   2. How are you currently taking this medication (dosage and times per day)?   No started yet  3. Are you having a reaction (difficulty breathing--STAT)?   N/A  4. What is your medication issue?   Patient stated [employer] company called her stating they do not have a prescription for her medication.  Patient stated her contact Cala Bradford at [place] company - [phone] or Porfirio Mylar [phone].  Patient wants a call back confirm prescription was sent.

## 2022-05-20 NOTE — Telephone Encounter (Signed)
Returned call to patient and provided the following information. She is asking questions about pricing, advised her to call her insurance for that information. She asked that this be sent over to [facility] due to the fact that Aundra Millet used to help them get a grant.   [id number]    "I verified with [name] at [facility] the order was entered on 4/12 and processed. she is scheduled for tx 05/24/22 If she calls back. Please provide her this information."

## 2022-05-20 NOTE — Telephone Encounter (Signed)
Amanda Cannon is free with her insurance. Pt aware.

## 2022-05-20 NOTE — Telephone Encounter (Signed)
Patient is returning call.  °

## 2022-05-20 NOTE — Progress Notes (Signed)
Leqvio order sent to [facility]

## 2022-05-24 ENCOUNTER — Ambulatory Visit (INDEPENDENT_AMBULATORY_CARE_PROVIDER_SITE_OTHER): Payer: Medicare Other | Admitting: *Deleted

## 2022-05-24 VITALS — BP 143/83 | HR 71 | Temp 98.1°F | Resp 16 | Ht <= 58 in | Wt 98.6 lb

## 2022-05-24 DIAGNOSIS — D485 Neoplasm of uncertain behavior of skin: Secondary | ICD-10-CM | POA: Diagnosis not present

## 2022-05-24 DIAGNOSIS — E782 Mixed hyperlipidemia: Secondary | ICD-10-CM

## 2022-05-24 DIAGNOSIS — L820 Inflamed seborrheic keratosis: Secondary | ICD-10-CM | POA: Diagnosis not present

## 2022-05-24 DIAGNOSIS — Z85828 Personal history of other malignant neoplasm of skin: Secondary | ICD-10-CM | POA: Diagnosis not present

## 2022-05-24 DIAGNOSIS — L814 Other melanin hyperpigmentation: Secondary | ICD-10-CM | POA: Diagnosis not present

## 2022-05-24 DIAGNOSIS — L570 Actinic keratosis: Secondary | ICD-10-CM | POA: Diagnosis not present

## 2022-05-24 DIAGNOSIS — L821 Other seborrheic keratosis: Secondary | ICD-10-CM | POA: Diagnosis not present

## 2022-05-24 MED ORDER — INCLISIRAN SODIUM 284 MG/1.5ML ~~LOC~~ SOSY
284.0000 mg | PREFILLED_SYRINGE | Freq: Once | SUBCUTANEOUS | Status: AC
  Administered 2022-05-24: 284 mg via SUBCUTANEOUS
  Filled 2022-05-24: qty 1.5

## 2022-05-24 NOTE — Patient Instructions (Signed)
Inclisiran Injection What is this medication? INCLISIRAN (in kli SIR an) treats high cholesterol. It works by decreasing bad cholesterol (such as LDL) in your blood. Changes to diet and exercise are often combined with this medication. This medicine may be used for other purposes; ask your health care provider or pharmacist if you have questions. COMMON BRAND NAME(S): LEQVIO What should I tell my care team before I take this medication? They need to know if you have any of these conditions: An unusual or allergic reaction to inclisiran, other medications, foods, dyes, or preservatives Pregnant or trying to get pregnant Breast-feeding How should I use this medication? This medication is injected under the skin. It is given by your care team in a hospital or clinic setting. Talk to your care team about the use of this medication in children. Special care may be needed. Overdosage: If you think you have taken too much of this medicine contact a poison control center or emergency room at once. NOTE: This medicine is only for you. Do not share this medicine with others. What if I miss a dose? Keep appointments for follow-up doses. It is important not to miss your dose. Call your care team if you are unable to keep an appointment. What may interact with this medication? Interactions are not expected. This list may not describe all possible interactions. Give your health care provider a list of all the medicines, herbs, non-prescription drugs, or dietary supplements you use. Also tell them if you smoke, drink alcohol, or use illegal drugs. Some items may interact with your medicine. What should I watch for while using this medication? Visit your care team for regular checks on your progress. Tell your care team if your symptoms do not start to get better or if they get worse. You may need blood work while you are taking this medication. What side effects may I notice from receiving this  medication? Side effects that you should report to your care team as soon as possible: Allergic reactions--skin rash, itching, hives, swelling of the face, lips, tongue, or throat Side effects that usually do not require medical attention (report these to your care team if they continue or are bothersome): Joint pain Pain, redness, or irritation at injection site This list may not describe all possible side effects. Call your doctor for medical advice about side effects. You may report side effects to FDA at 1-800-FDA-1088. Where should I keep my medication? This medication is given in a hospital or clinic. It will not be stored at home. NOTE: This sheet is a summary. It may not cover all possible information. If you have questions about this medicine, talk to your doctor, pharmacist, or health care provider.  2023 Elsevier/Gold Standard (2020-01-29 00:00:00)  

## 2022-05-24 NOTE — Progress Notes (Signed)
Diagnosis: Hyperlipidemia  Provider:  Praveen Mannam MD  Procedure: Injection  Leqvio (inclisiran), Dose: 284 mg, Site: subcutaneous, Number of injections: 1  Post Care: Observation period completed  Discharge: Condition: Good, Destination: Home . AVS Provided  Performed by:  Amie Cowens A, RN       

## 2022-05-31 ENCOUNTER — Ambulatory Visit: Payer: Medicare Other | Admitting: Internal Medicine

## 2022-06-09 ENCOUNTER — Other Ambulatory Visit: Payer: Self-pay | Admitting: Internal Medicine

## 2022-06-09 DIAGNOSIS — Z0000 Encounter for general adult medical examination without abnormal findings: Secondary | ICD-10-CM

## 2022-06-14 ENCOUNTER — Ambulatory Visit: Payer: Medicare Other | Admitting: Internal Medicine

## 2022-06-14 ENCOUNTER — Encounter: Payer: Self-pay | Admitting: Internal Medicine

## 2022-06-14 VITALS — BP 116/70 | HR 82 | Temp 98.3°F | Resp 18 | Ht <= 58 in | Wt 99.2 lb

## 2022-06-14 DIAGNOSIS — E782 Mixed hyperlipidemia: Secondary | ICD-10-CM | POA: Diagnosis not present

## 2022-06-14 DIAGNOSIS — R42 Dizziness and giddiness: Secondary | ICD-10-CM

## 2022-06-14 DIAGNOSIS — I10 Essential (primary) hypertension: Secondary | ICD-10-CM

## 2022-06-14 DIAGNOSIS — H6120 Impacted cerumen, unspecified ear: Secondary | ICD-10-CM

## 2022-06-14 MED ORDER — CARBAMIDE PEROXIDE 6.5 % OT SOLN: 5.0000 [drp] | Freq: Two times a day (BID) | OTIC | 2 refills | Status: DC

## 2022-06-14 MED ORDER — AMLODIPINE BESYLATE 5 MG PO TABS: 5.0000 mg | ORAL_TABLET | Freq: Every day | ORAL | 3 refills | Status: DC

## 2022-06-14 MED ORDER — MECLIZINE HCL 25 MG PO TABS: 25.0000 mg | ORAL_TABLET | Freq: Two times a day (BID) | ORAL | 2 refills | Status: DC | PRN

## 2022-06-14 NOTE — Progress Notes (Addendum)
Office Visit  Subjective   Patient ID: Amanda Cannon   DOB: 01/30/1945   Age: 77 y.o.   MRN: [id number]   Chief Complaint Chief Complaint  Patient presents with   Follow-up    3 month follow up     History of Present Illness 77 years old female is here for follow up of her chronic medical problem. She says that she receive vestibular disorder physical therapy that did help some but she still has vertigo and need to take meclizine as needed.  She also has hypertension and take amlodipine for it. Her blood pressure is well controlled.   She also feel that she may have wax as it is hard for her to hear.   Past Medical History Past Medical History:  Diagnosis Date   Alcohol use    Anemia    Anxiety    Asthma    Basal cell carcinoma    forehead   Breast cancer (HCC) 1998   left    Colon polyps    GERD (gastroesophageal reflux disease)    Glaucoma    Hyperlipidemia    Hypertension    Hyponatremia    remote, mild   Mitral valve prolapse    Osteoarthritis    sees rheumatologist   Osteoporosis    sees rheumatologist   Personal history of radiation therapy 1998   PMR (polymyalgia rheumatica) (HCC)    sees rheumatologist   Pneumonia    Prinzmetal angina (HCC)    White matter changes    ? ischemic sm vs per pt     Allergies Allergies  Allergen Reactions   Tylenol [Acetaminophen] Cough   Turmeric     Lowers BP   Betamethasone     Other reaction(s): Not available   Erythromycin     Stomach cramps   Erythromycin Base Other (See Comments)    Stomach issues   Other     Beta-blockers-patient states it makes her crazy   Sulfa Antibiotics Other (See Comments)    Doesn't recall     Review of Systems Review of Systems  Constitutional: Negative.   HENT:  Positive for hearing loss.   Respiratory: Negative.    Cardiovascular: Negative.   Gastrointestinal: Negative.   Neurological:  Positive for dizziness.       Objective:    Vitals BP 116/70 (BP  Location: Left Arm, Patient Position: Sitting)   Pulse 82   Temp 98.3 F (36.8 C)   Resp 18   Ht 4\' 8"  (1.422 m)   Wt 99 lb 4 oz (45 kg)   SpO2 99%   BMI 22.25 kg/m    Physical Examination Physical Exam Constitutional:      Appearance: Normal appearance.  HENT:     Head: Normocephalic and atraumatic.     Right Ear: There is impacted cerumen.     Left Ear: There is impacted cerumen.  Cardiovascular:     Pulses: Normal pulses.     Heart sounds: Normal heart sounds.  Pulmonary:     Breath sounds: Normal breath sounds.  Abdominal:     General: Bowel sounds are normal.     Palpations: Abdomen is soft.  Neurological:     General: No focal deficit present.     Mental Status: She is alert.        Assessment & Plan:   Essential hypertension controlled  Hyperlipidemia She is on inclisitran injection every 6 month  Vertigo I will send refill of meclizine to  take as needed  Wax in ear She will put debrox ear drops for 5 days then may need irrigation.    Return in about 3 months (around 09/14/2022).   Eloisa Northern, MD

## 2022-06-21 ENCOUNTER — Encounter: Payer: Self-pay | Admitting: Internal Medicine

## 2022-06-21 ENCOUNTER — Ambulatory Visit: Payer: Medicare Other | Admitting: Internal Medicine

## 2022-06-21 VITALS — BP 120/70 | HR 96 | Temp 97.8°F | Resp 18 | Ht <= 58 in | Wt 99.5 lb

## 2022-06-21 DIAGNOSIS — H6120 Impacted cerumen, unspecified ear: Secondary | ICD-10-CM | POA: Diagnosis not present

## 2022-06-21 NOTE — Assessment & Plan Note (Signed)
Wax was removed manually and TM is normal and no fluid

## 2022-06-21 NOTE — Progress Notes (Signed)
   Acute Office Visit  Subjective:     Patient ID: Amanda Cannon, female    DOB: Jul 12, 1945, 77 y.o.   MRN: [id number]  Chief Complaint  Patient presents with   office visit    1 week follow up for ear irrigation     HPI Patient is in today for wax in her right ear and difficult to hear. She put debrox ear drop and wanted to be checked. She does not like irrigation and has history of vestibular dysfunction.   Review of Systems  HENT:  Positive for hearing loss.   Respiratory: Negative.          Objective:    BP 120/70 (BP Location: Left Arm, Patient Position: Sitting, Cuff Size: Normal)   Pulse 96   Temp 97.8 F (36.6 C)   Resp 18   Ht 4\' 10"  (1.473 m)   Wt 99 lb 8 oz (45.1 kg)   SpO2 97%   BMI 20.80 kg/m    Physical Exam HENT:     Right Ear: There is impacted cerumen.     No results found for any visits on 06/21/22.      Assessment & Plan:   Problem List Items Addressed This Visit       Other   Wax in ear - Primary    Wax was removed manually and TM is normal and no fluid       No orders of the defined types were placed in this encounter.   No follow-ups on file.  Eloisa Northern, MD

## 2022-06-30 DIAGNOSIS — F320 Major depressive disorder, single episode, mild: Secondary | ICD-10-CM | POA: Diagnosis not present

## 2022-07-21 DIAGNOSIS — E7849 Other hyperlipidemia: Secondary | ICD-10-CM | POA: Diagnosis not present

## 2022-07-21 DIAGNOSIS — I10 Essential (primary) hypertension: Secondary | ICD-10-CM | POA: Diagnosis not present

## 2022-07-21 DIAGNOSIS — Z5181 Encounter for therapeutic drug level monitoring: Secondary | ICD-10-CM | POA: Diagnosis not present

## 2022-07-22 LAB — COMPREHENSIVE METABOLIC PANEL
ALT: 16 IU/L (ref 0–32)
AST: 28 IU/L (ref 0–40)
Albumin: 4.4 g/dL (ref 3.8–4.8)
Alkaline Phosphatase: 73 IU/L (ref 44–121)
BUN/Creatinine Ratio: 19 (ref 12–28)
BUN: 21 mg/dL (ref 8–27)
Bilirubin Total: 0.4 mg/dL (ref 0.0–1.2)
CO2: 19 mmol/L — ABNORMAL LOW (ref 20–29)
Calcium: 9.9 mg/dL (ref 8.7–10.3)
Chloride: 101 mmol/L (ref 96–106)
Creatinine, Ser: 1.1 mg/dL — ABNORMAL HIGH (ref 0.57–1.00)
Globulin, Total: 2.7 g/dL (ref 1.5–4.5)
Glucose: 88 mg/dL (ref 70–99)
Potassium: 4.6 mmol/L (ref 3.5–5.2)
Sodium: 138 mmol/L (ref 134–144)
Total Protein: 7.1 g/dL (ref 6.0–8.5)
eGFR: 52 mL/min/{1.73_m2} — ABNORMAL LOW (ref >59)

## 2022-07-22 LAB — LIPID PANEL
Chol/HDL Ratio: 1.9 ratio (ref 0.0–4.4)
Cholesterol, Total: 181 mg/dL (ref 100–199)
HDL: 94 mg/dL (ref >39)
LDL Chol Calc (NIH): 67 mg/dL (ref 0–99)
Triglycerides: 119 mg/dL (ref 0–149)
VLDL Cholesterol Cal: 20 mg/dL (ref 5–40)

## 2022-07-25 DIAGNOSIS — H5203 Hypermetropia, bilateral: Secondary | ICD-10-CM | POA: Diagnosis not present

## 2022-07-25 DIAGNOSIS — H40053 Ocular hypertension, bilateral: Secondary | ICD-10-CM | POA: Diagnosis not present

## 2022-08-10 ENCOUNTER — Encounter (HOSPITAL_BASED_OUTPATIENT_CLINIC_OR_DEPARTMENT_OTHER): Payer: Self-pay | Admitting: Cardiovascular Disease

## 2022-08-18 ENCOUNTER — Ambulatory Visit
Admission: RE | Admit: 2022-08-18 | Discharge: 2022-08-18 | Disposition: A | Discharge status: Home / Self Care | Payer: Medicare Other | Source: Ambulatory Visit | Attending: Internal Medicine | Admitting: Internal Medicine

## 2022-08-18 DIAGNOSIS — Z0000 Encounter for general adult medical examination without abnormal findings: Secondary | ICD-10-CM

## 2022-08-18 DIAGNOSIS — Z1231 Encounter for screening mammogram for malignant neoplasm of breast: Secondary | ICD-10-CM | POA: Diagnosis not present

## 2022-08-23 ENCOUNTER — Ambulatory Visit (INDEPENDENT_AMBULATORY_CARE_PROVIDER_SITE_OTHER): Payer: Medicare Other

## 2022-08-23 VITALS — BP 153/77 | HR 76 | Temp 98.1°F | Resp 15 | Ht <= 58 in | Wt 98.0 lb

## 2022-08-23 DIAGNOSIS — E782 Mixed hyperlipidemia: Secondary | ICD-10-CM

## 2022-08-23 MED ORDER — INCLISIRAN SODIUM 284 MG/1.5ML ~~LOC~~ SOSY
284.0000 mg | PREFILLED_SYRINGE | Freq: Once | SUBCUTANEOUS | Status: AC
  Administered 2022-08-23: 284 mg via SUBCUTANEOUS
  Filled 2022-08-23: qty 1.5

## 2022-08-23 NOTE — Progress Notes (Signed)
Diagnosis: Hyperlipidemia  Provider:  Chilton Greathouse MD  Procedure: Injection  Leqvio (inclisiran), Dose: 284 mg, Site: subcutaneous, Number of injections: 1  Post Care: Patient declined observation. Right arm injection  Discharge: Condition: Good, Destination: Home . AVS Declined  Performed by:  Rico Ala, LPN

## 2022-09-06 DIAGNOSIS — H40013 Open angle with borderline findings, low risk, bilateral: Secondary | ICD-10-CM | POA: Diagnosis not present

## 2022-09-13 ENCOUNTER — Ambulatory Visit: Payer: Medicare Other | Admitting: Internal Medicine

## 2022-09-13 ENCOUNTER — Encounter: Payer: Self-pay | Admitting: Internal Medicine

## 2022-09-13 VITALS — BP 108/72 | HR 68 | Temp 97.7°F | Resp 18 | Ht <= 58 in | Wt 97.1 lb

## 2022-09-13 DIAGNOSIS — M810 Age-related osteoporosis without current pathological fracture: Secondary | ICD-10-CM | POA: Diagnosis not present

## 2022-09-13 DIAGNOSIS — I10 Essential (primary) hypertension: Secondary | ICD-10-CM

## 2022-09-13 DIAGNOSIS — H8190 Unspecified disorder of vestibular function, unspecified ear: Secondary | ICD-10-CM

## 2022-09-13 DIAGNOSIS — E871 Hypo-osmolality and hyponatremia: Secondary | ICD-10-CM

## 2022-09-13 DIAGNOSIS — F419 Anxiety disorder, unspecified: Secondary | ICD-10-CM | POA: Diagnosis not present

## 2022-09-13 NOTE — Progress Notes (Unsigned)
   Office Visit  Subjective   Patient ID: Amanda Cannon   DOB: [birth year]   Age: 77 y.o.   MRN: [id number]   Chief Complaint Chief Complaint  Patient presents with   Follow-up    3 month follow up     History of Present Illness 77 years old female with hypertension, chronic vertigo who was told that her eye pressure been high 34 and 36. She was advised to hold meclizine and refer her to see glaucoma specialist and she has appointment in October.   She has hypertension and her blood pressure is good.    Past Medical History Past Medical History:  Diagnosis Date   Alcohol use    Anemia    Anxiety    Asthma    Basal cell carcinoma    forehead   Breast cancer (HCC) 1998   left    Colon polyps    GERD (gastroesophageal reflux disease)    Glaucoma    Hyperlipidemia    Hypertension    Hyponatremia    remote, mild   Mitral valve prolapse    Osteoarthritis    sees rheumatologist   Osteoporosis    sees rheumatologist   Personal history of radiation therapy 1998   PMR (polymyalgia rheumatica) (HCC)    sees rheumatologist   Pneumonia    Prinzmetal angina (HCC)    White matter changes    ? ischemic sm vs per pt     Allergies Allergies  Allergen Reactions   Tylenol [Acetaminophen] Cough   Turmeric     Lowers BP   Betamethasone     Other reaction(s): Not available   Erythromycin     Stomach cramps   Erythromycin Base Other (See Comments)    Stomach issues   Other     Beta-blockers-patient states it makes her crazy   Sulfa Antibiotics Other (See Comments)    Doesn't recall     Review of Systems ROS     Objective:    Vitals BP 108/72 (BP Location: Left Arm, Patient Position: Sitting, Cuff Size: Normal)   Pulse 68   Temp 97.7 F (36.5 C)   Resp 18   Ht 4\' 10"  (1.473 m)   Wt 97 lb 2 oz (44.1 kg)   SpO2 97%   BMI 20.30 kg/m    Physical Examination Physical Exam     Assessment & Plan:   No problem-specific Assessment & Plan notes found for  this encounter.    No follow-ups on file.   Eloisa Northern, MD

## 2022-09-14 NOTE — Assessment & Plan Note (Signed)
I will repeat BMP today.

## 2022-09-14 NOTE — Assessment & Plan Note (Signed)
controlled 

## 2022-09-14 NOTE — Assessment & Plan Note (Signed)
She is on inclisitran injection every 6 month

## 2022-09-14 NOTE — Assessment & Plan Note (Signed)
I will send refill of meclizine to take as needed

## 2022-09-14 NOTE — Assessment & Plan Note (Signed)
Continue with prolia every 6 month

## 2022-09-14 NOTE — Assessment & Plan Note (Signed)
She will put debrox ear drops for 5 days then may need irrigation.

## 2022-09-14 NOTE — Assessment & Plan Note (Signed)
I have suggested to restart meclizine as it is difficult for her to live without meclizine.

## 2022-09-21 DIAGNOSIS — H401134 Primary open-angle glaucoma, bilateral, indeterminate stage: Secondary | ICD-10-CM | POA: Diagnosis not present

## 2022-09-29 DIAGNOSIS — Z23 Encounter for immunization: Secondary | ICD-10-CM | POA: Diagnosis not present

## 2022-10-10 DIAGNOSIS — H401134 Primary open-angle glaucoma, bilateral, indeterminate stage: Secondary | ICD-10-CM | POA: Diagnosis not present

## 2022-10-13 DIAGNOSIS — M5030 Other cervical disc degeneration, unspecified cervical region: Secondary | ICD-10-CM | POA: Diagnosis not present

## 2022-10-13 DIAGNOSIS — Z6821 Body mass index (BMI) 21.0-21.9, adult: Secondary | ICD-10-CM | POA: Diagnosis not present

## 2022-10-13 DIAGNOSIS — M1991 Primary osteoarthritis, unspecified site: Secondary | ICD-10-CM | POA: Diagnosis not present

## 2022-10-13 DIAGNOSIS — M25551 Pain in right hip: Secondary | ICD-10-CM | POA: Diagnosis not present

## 2022-10-13 DIAGNOSIS — M5136 Other intervertebral disc degeneration, lumbar region: Secondary | ICD-10-CM | POA: Diagnosis not present

## 2022-10-13 DIAGNOSIS — M353 Polymyalgia rheumatica: Secondary | ICD-10-CM | POA: Diagnosis not present

## 2022-10-13 DIAGNOSIS — M810 Age-related osteoporosis without current pathological fracture: Secondary | ICD-10-CM | POA: Diagnosis not present

## 2022-10-13 DIAGNOSIS — M654 Radial styloid tenosynovitis [de Quervain]: Secondary | ICD-10-CM | POA: Diagnosis not present

## 2022-10-20 DIAGNOSIS — H401134 Primary open-angle glaucoma, bilateral, indeterminate stage: Secondary | ICD-10-CM | POA: Diagnosis not present

## 2022-11-02 ENCOUNTER — Telehealth: Payer: Self-pay | Admitting: Gastroenterology

## 2022-11-02 NOTE — Telephone Encounter (Signed)
The pt was last seen on 12/29/21 by Russella Dar for chest pain.  She had esophagram that showed  "Single contrast esophagram significant for mild dysmotility of the lower 1/3 of the esophagus and mild gastroesophageal reflux Minimal findings - this is reassuring"  The pt is taking pepcid 40 mg twice daily and has done well until this past week when she developed severe chest and back pain that caused her to lie down for 15 min.  She was advised by another person with her that she should go to the ED but the pt declined stating she does not think that this is cardiac related.  I have also advised that if she has this attack again she needs to be evaluated in the ED. She has an appt with Dr Russella Dar on 11/11 and she will keep that appt.  She is also going to call her PCP and cardiology  FYI Dr Russella Dar

## 2022-11-02 NOTE — Telephone Encounter (Signed)
Inbound call from patient stating she experienced severe pain in her chest and back. Patient requesting a call to be advised further. Please advise, thank you.

## 2022-11-14 DIAGNOSIS — H401134 Primary open-angle glaucoma, bilateral, indeterminate stage: Secondary | ICD-10-CM | POA: Diagnosis not present

## 2022-11-22 DIAGNOSIS — M810 Age-related osteoporosis without current pathological fracture: Secondary | ICD-10-CM | POA: Diagnosis not present

## 2022-11-29 DIAGNOSIS — F320 Major depressive disorder, single episode, mild: Secondary | ICD-10-CM | POA: Diagnosis not present

## 2022-12-05 ENCOUNTER — Encounter: Payer: Self-pay | Admitting: Gastroenterology

## 2022-12-05 ENCOUNTER — Ambulatory Visit (INDEPENDENT_AMBULATORY_CARE_PROVIDER_SITE_OTHER): Payer: Medicare Other | Admitting: Gastroenterology

## 2022-12-05 VITALS — BP 110/70 | HR 89 | Ht <= 58 in

## 2022-12-05 DIAGNOSIS — R079 Chest pain, unspecified: Secondary | ICD-10-CM

## 2022-12-05 DIAGNOSIS — K219 Gastro-esophageal reflux disease without esophagitis: Secondary | ICD-10-CM

## 2022-12-05 DIAGNOSIS — Z800 Family history of malignant neoplasm of digestive organs: Secondary | ICD-10-CM | POA: Diagnosis not present

## 2022-12-05 DIAGNOSIS — Z7689 Persons encountering health services in other specified circumstances: Secondary | ICD-10-CM | POA: Diagnosis not present

## 2022-12-05 NOTE — Progress Notes (Signed)
Assessment     Chest pain, etiology unclear. Her symptoms are not generally typical for esophageal symptoms however she did have pain with swallowing the barium tablet GERD CIC Family history of colon cancer, multiple second-degree relatives and personal history of adenomatous colon polyps Periumbilical hernia   Recommendations    Cardiology evaluation prior to colonoscopy and EGD.  EGD after cardiology evaluation and risk stratification. The risks (including bleeding, perforation, infection, missed lesions, medication reactions and possible hospitalization or surgery if complications occur), benefits, and alternatives to endoscopy with possible biopsy and possible dilation were discussed with the patient and they consent to proceed.   Schedule colonoscopy after cardiology evaluation and risk stratification. The risks (including bleeding, perforation, infection, missed lesions, medication reactions and possible hospitalization or surgery if complications occur), benefits, and alternatives to colonoscopy with possible biopsy and possible polypectomy were discussed with the patient and they consent to proceed.   Continue with famotidine 40 mg daily and follow antireflux measures.   HPI    This is a 77 year old female who reports intermittent chest pain.  She is accompanied by her husband.  She relates a history of intermittent chest pain that occurs randomly.  Her chest pain has not associated with meals swallowing or exertion however chest pain did occur during her barium esophagram while swallowing a barium tablet.  Her chest pain has been intermittently bothersome for over 1 year.  She describes an episode of chest pain occurring about 6 weeks ago while walking slowly that was more severe.  The pain was across her anterior chest and resolved without further treatment in about 15 minutes.  She relates intermittent heartburn that is different than the chest pain symptoms.  She relates that  she has not discussed the symptoms with her cardiologist or her PCP.  Barium esophagram January 2024 Single contrast esophagram significant for mild dysmotility of the lower 1/3 of the esophagus and mild gastroesophageal reflux.   Barium tablet: Initially became stuck near the aortic notch, after multiple sips of water and barium the tablet then passed another approximately 6 cm distally before again becoming stuck. The patient began to experience chest pain symptoms consistent with her initial complaint while the pill was at this level.    Labs / Imaging       Latest Ref Rng & Units 07/21/2022    7:55 AM 05/02/2022   11:22 AM 11/26/2021    7:53 AM  Hepatic Function  Total Protein 6.0 - 8.5 g/dL 7.1  8.5  7.4   Albumin 3.8 - 4.8 g/dL 4.4  5.3  4.8   AST 0 - 40 IU/L 28  30  24    ALT 0 - 32 IU/L 16  14  16    Alk Phosphatase 44 - 121 IU/L 73  63  67   Total Bilirubin 0.0 - 1.2 mg/dL 0.4  0.7  0.4   Bilirubin, Direct 0.00 - 0.40 mg/dL   8.29        Latest Ref Rng & Units 05/02/2022    1:31 PM 05/02/2022   11:50 AM 05/31/2021   10:00 AM  CBC  WBC 4.0 - 10.5 K/uL  6.3  5.6   Hemoglobin 12.0 - 15.0 g/dL 56.2  13.0  86.5   Hematocrit 36.0 - 46.0 % 49.0  43.1  40.3   Platelets 150 - 400 K/uL  274  256.0    Current Medications, Allergies, Past Medical History, Past Surgical History, Family History and Social History  were reviewed in [facility] electronic medical record.   Physical Exam: General: Well developed, well nourished, elderly, no acute distress Head: Normocephalic and atraumatic Eyes: Sclerae anicteric, EOMI Ears: Normal auditory acuity Mouth: No deformities or lesions noted Lungs: Clear throughout to auscultation Heart: Regular rate and rhythm; No murmurs, rubs or bruits Abdomen: Soft, non tender and non distended. No masses, hepatosplenomegaly noted.  Small periumbilical hernia.  Normal Bowel sounds Rectal: Deferred to colonoscopy  Musculoskeletal: Symmetrical with no  gross deformities  Pulses:  Normal pulses noted Extremities: No edema or deformities noted Neurological: Alert oriented x 4, grossly nonfocal Psychological:  Alert and cooperative. Normal mood and affect   Raseel Jans T. Russella Dar, MD 12/05/2022, 1:38 PM

## 2022-12-05 NOTE — Patient Instructions (Addendum)
Please see your cardiologist for your ongoing chest pain. After your cardiologist has cleared you, then please contact our office to schedule your procedures with Dr. Barron Alvine.   We will refer you to [facility]. They will contact you directly with an appt.  The [facility] providers would like to encourage you to use [facility] to communicate with providers for non-urgent requests or questions.  Due to long hold times on the telephone, sending your provider a message by [facility] may be a faster and more efficient way to get a response.  Please allow 48 business hours for a response.  Please remember that this is for non-urgent requests.   Thank you for choosing me and [facility].  Venita Lick. Pleas Koch., MD., Clementeen Graham

## 2022-12-06 ENCOUNTER — Telehealth (HOSPITAL_BASED_OUTPATIENT_CLINIC_OR_DEPARTMENT_OTHER): Payer: Self-pay | Admitting: Cardiovascular Disease

## 2022-12-06 NOTE — Telephone Encounter (Signed)
  Per MyChart scheduling message:  Initial complaint: Severe pain in chest that lasted 15 minutes   Pt c/o of Chest Pain: STAT if active CP, including tightness, pressure, jaw pain, radiating pain to shoulder/upper arm/back, CP unrelieved by Nitro. Symptoms reported of SOB, nausea, vomiting, sweating.  1. Are you having CP right now?    2. Are you experiencing any other symptoms (ex. SOB, nausea, vomiting, sweating)?   3. Is your CP continuous or coming and going?   4. Have you taken Nitroglycerin?   5. How long have you been experiencing CP?    6. If NO CP at time of call then end call with telling Pt to call back or call 911 if Chest pain returns prior to return call from triage team.    1 no 2 stomach issues  3 chest pains 6 weeks ago severe 4 no 5 had extreme main for at least 15 minutes

## 2022-12-06 NOTE — Telephone Encounter (Signed)
Called patient to follow up, she was in a meeting so only able to speak briefly Pain was 6 weeks ago and said she would keep appointment as scheduled

## 2022-12-07 ENCOUNTER — Other Ambulatory Visit: Payer: Self-pay | Admitting: Allergy & Immunology

## 2022-12-08 ENCOUNTER — Encounter: Payer: Self-pay | Admitting: Internal Medicine

## 2022-12-08 ENCOUNTER — Ambulatory Visit: Payer: Medicare Other | Admitting: Internal Medicine

## 2022-12-08 VITALS — BP 110/70 | HR 92 | Temp 98.0°F | Resp 18 | Ht <= 58 in | Wt 97.0 lb

## 2022-12-08 DIAGNOSIS — H832X3 Labyrinthine dysfunction, bilateral: Secondary | ICD-10-CM | POA: Diagnosis not present

## 2022-12-08 DIAGNOSIS — M353 Polymyalgia rheumatica: Secondary | ICD-10-CM

## 2022-12-08 NOTE — Assessment & Plan Note (Signed)
I am going to refer her to ENT per her request for evaluation.

## 2022-12-08 NOTE — Assessment & Plan Note (Signed)
She will followup with Dr. Dierdre Forth and continue on prednisone.    We will obtain Dr. Shawnee Knapp notes.

## 2022-12-08 NOTE — Progress Notes (Signed)
Office Visit  Subjective   Patient ID: Amanda Cannon   DOB: 1945-10-24   Age: 77 y.o.   MRN: [id number]   Chief Complaint Chief Complaint  Patient presents with   Follow-up    Follow up     History of Present Illness Amanda Cannon comes in today for a followup appointment where she missed her appointment with Dr. Nelson Chimes.  She states she was seen by rheumatology in 09/2022 and they noted that she was sympatomic with her PMR and had an ESR >90.  They started her on prednisone 30mg  daily and they have been tapering her dose where today is her first day of being on prednisone 5mg  daily.  Her pain and swelling involving her right hip/leg have improved.    The paitient also comes in today to for a referral to ENT in [facility].  She has a history of vestibular disequilibrium which started about 8 years ago.  She saw a specialist at [facility] in 10/2021 last year and has seen neurology in the past for this.  The patient has undergone thorough vestibular testing which was negative. Neurology had recommended she undergo vestibular rehab, she did 12 weeks with minimal benefit per the patient.  Today, when she turns her head either way, she gets dizzy.  She does not really have dizziness with superior/inferior motion. Her sensation is that she feels off balance, without spinning sensation and the episodes will last about 4-5 seconds.  This happens every time she turns her head, it does not happen otherwise. If she is perfectly still, there is no imbalance. There are no symptoms when she is turning in bed. She will get lightheaded at times when she stands up but she is not sure if it is related. She had a sinus CT scan in 2021 which was negative for sinus disease. She has no history of migraines. She sometimes has slight double vision. She reports numbness from her left ear to across her cheek to her left upper eye lid.  She does have tinnitus and she has hearing loss.  She has seen audiology and is going for  hearing aids.   Notably, she has a younger-adult onset of white matter lesions. She has a family history of MS, but she herself does not have MS. She denies migraine history.She had a vestibular exam results provided by Dr. Suszanne Conners and noted by Dr. Gust Rung at [facility].  She has been started on betahistine in the past but this rasised her intraocular pressure with her glaucoma.   She is currently on meclizine 25mg  daily.  She has had a brain MRI and vestibular testing at [facility].  She had a discussion with a physician friend here at town and she wants to be referred to Ermalinda Barrios at [facility].     Past Medical History Past Medical History:  Diagnosis Date   Alcohol use    Anemia    Anxiety    Asthma    Basal cell carcinoma    forehead   Breast cancer (HCC) 1998   left    Colon polyps    GERD (gastroesophageal reflux disease)    Glaucoma    Hyperlipidemia    Hypertension    Hyponatremia    remote, mild   Mitral valve prolapse    Osteoarthritis    sees rheumatologist   Osteoporosis    sees rheumatologist   Personal history of radiation therapy 1998   PMR (polymyalgia rheumatica) (HCC)    sees  rheumatologist   Pneumonia    Prinzmetal angina (HCC)    White matter changes    ? ischemic sm vs per pt     Allergies Allergies  Allergen Reactions   Tylenol [Acetaminophen] Cough   Turmeric     Lowers BP   Betamethasone     Other reaction(s): Not available   Erythromycin     Stomach cramps   Erythromycin Base Other (See Comments)    Stomach issues   Other     Beta-blockers-patient states it makes her crazy   Sulfa Antibiotics Other (See Comments)    Doesn't recall     Medications  Current Outpatient Medications:    amLODipine (NORVASC) 5 MG tablet, Take 1 tablet (5 mg total) by mouth daily., Disp: 90 tablet, Rfl: 3   aspirin 81 MG tablet, Take 81 mg by mouth daily., Disp: , Rfl:    carbamide peroxide (DEBROX) 6.5 % OTIC solution, Place 5 drops into the right ear 2 (two)  times daily., Disp: 15 mL, Rfl: 2   cetirizine (ZYRTEC) 10 MG tablet, Take 1 tablet (10 mg total) by mouth 2 (two) times daily., Disp: 60 tablet, Rfl: 5   Cholecalciferol 25 MCG (1000 UT) tablet, Take by mouth., Disp: , Rfl:    denosumab (PROLIA) 60 MG/ML SOLN injection, Inject 60 mg into the skin every 6 (six) months. Administer in upper arm, thigh, or abdomen, Disp: , Rfl:    famotidine (PEPCID) 40 MG tablet, TAKE 1 TABLET BY MOUTH EVERYDAY AT BEDTIME, Disp: 90 tablet, Rfl: 1   fluticasone (FLONASE) 50 MCG/ACT nasal spray, SPRAY 2 SPRAYS INTO EACH NOSTRIL EVERY DAY, Disp: 48 mL, Rfl: 0   furosemide (LASIX) 20 MG tablet, Take 20 mg by mouth daily., Disp: , Rfl:    inclisiran (LEQVIO) 284 MG/1.5ML SOSY injection, Inject 284 mg into the skin once., Disp: , Rfl:    latanoprost (XALATAN) 0.005 % ophthalmic solution, 1 drop at bedtime., Disp: , Rfl:    LORazepam (ATIVAN) 1 MG tablet, Take 1 mg by mouth at bedtime as needed (spasms)., Disp: , Rfl:    magnesium oxide (MAG-OX) 400 MG tablet, Take by mouth., Disp: , Rfl:    meclizine (ANTIVERT) 25 MG tablet, Take 1 tablet (25 mg total) by mouth 2 (two) times daily as needed for dizziness., Disp: 60 tablet, Rfl: 2   naproxen sodium (ALEVE) 220 MG tablet, 1 tablet as needed, Disp: , Rfl:    predniSONE (STERAPRED UNI-PAK 48 TAB) 5 MG (48) TBPK tablet, Take 5 mg by mouth once., Disp: , Rfl:   Current Facility-Administered Medications:    inclisiran (LEQVIO) injection 284 mg, 284 mg, Subcutaneous, Once, Tylene Fantasia, RPH   Review of Systems Review of Systems  Constitutional:  Negative for chills and fever.  Eyes:  Positive for double vision. Negative for blurred vision.  Respiratory:  Negative for shortness of breath.   Cardiovascular:  Negative for chest pain, palpitations and leg swelling.  Gastrointestinal:  Negative for abdominal pain, constipation, diarrhea, nausea and vomiting.  Neurological:  Positive for dizziness and headaches. Negative for  seizures and weakness.  Psychiatric/Behavioral:  Negative for substance abuse.        Objective:    Vitals BP 110/70 (BP Location: Left Arm, Patient Position: Sitting, Cuff Size: Normal)   Pulse 92   Temp 98 F (36.7 C)   Resp 18   Ht 4\' 10"  (1.473 m)   Wt 97 lb (44 kg)   SpO2 98%  BMI 20.27 kg/m    Physical Examination Physical Exam Constitutional:      Appearance: Normal appearance. She is not ill-appearing.  Cardiovascular:     Rate and Rhythm: Normal rate and regular rhythm.     Pulses: Normal pulses.     Heart sounds: No murmur heard.    No friction rub. No gallop.  Pulmonary:     Effort: Pulmonary effort is normal. No respiratory distress.     Breath sounds: No wheezing, rhonchi or rales.  Abdominal:     General: Bowel sounds are normal. There is no distension.     Palpations: Abdomen is soft.     Tenderness: There is no abdominal tenderness.  Musculoskeletal:     Right lower leg: No edema.     Left lower leg: No edema.  Skin:    General: Skin is warm and dry.     Findings: No rash.  Neurological:     Mental Status: She is alert.     Comments: CN II-XII fully intact and her strength is 5/5 thru out.        Assessment & Plan:   No problem-specific Assessment & Plan notes found for this encounter.    No follow-ups on file.   Crist Fat, MD

## 2022-12-13 ENCOUNTER — Encounter (HOSPITAL_BASED_OUTPATIENT_CLINIC_OR_DEPARTMENT_OTHER): Payer: Self-pay | Admitting: Family

## 2022-12-13 ENCOUNTER — Ambulatory Visit (HOSPITAL_BASED_OUTPATIENT_CLINIC_OR_DEPARTMENT_OTHER): Payer: Medicare Other | Admitting: Family

## 2022-12-13 ENCOUNTER — Ambulatory Visit: Payer: Medicare Other | Admitting: Internal Medicine

## 2022-12-13 VITALS — BP 138/78 | HR 91 | Ht <= 58 in | Wt 96.0 lb

## 2022-12-13 DIAGNOSIS — R072 Precordial pain: Secondary | ICD-10-CM | POA: Diagnosis not present

## 2022-12-13 DIAGNOSIS — I10 Essential (primary) hypertension: Secondary | ICD-10-CM | POA: Diagnosis not present

## 2022-12-13 DIAGNOSIS — R0789 Other chest pain: Secondary | ICD-10-CM | POA: Diagnosis not present

## 2022-12-13 DIAGNOSIS — R079 Chest pain, unspecified: Secondary | ICD-10-CM

## 2022-12-13 MED ORDER — METOPROLOL TARTRATE 100 MG PO TABS: 100.0000 mg | ORAL_TABLET | Freq: Once | ORAL | 0 refills | Status: DC

## 2022-12-13 NOTE — Patient Instructions (Addendum)
Medication Instructions:  Your physician has recommended you make the following change in your medication:  TAKE Metoprolol Tartrate 100mg  (please take day of CTA) Hold Lasix day of CTA Ok to take Amlodipine day of CTA  *If you need a refill on your cardiac medications before your next appointment, please call your pharmacy*  Lab Work: BMP today  If you have labs (blood work) drawn today and your tests are completely normal, you will receive your results only by: MyChart Message (if you have MyChart) OR A paper copy in the mail If you have any lab test that is abnormal or we need to change your treatment, we will call you to review the results.   Testing/Procedures:   Your cardiac CT will be scheduled at one of the below locations:   [facility] [address] [place], [place] [address] [phone]  OR  [facility] [address] [place], [place] [address] [phone]  OR   [facility] [address] [place], [place] [address] [phone]  If scheduled at [facility], please arrive at the [facility] and Children's Entrance (Entrance C2) of [facility] 30 minutes prior to test start time. You can use the FREE valet parking offered at entrance C (encouraged to control the heart rate for the test)  Proceed to the [facility] Radiology Department (first floor) to check-in and test prep.  All radiology patients and guests should use entrance C2 at [facility], accessed from [facility], even though the hospital's physical address listed is [address].    If scheduled at [facility] or [facility], please arrive 15 mins early for check-in and test prep.  There is spacious parking and easy access to the radiology department from the [facility] V[facility] entrance. Please enter here and check-in  with the desk attendant.   Please follow these instructions carefully (unless otherwise directed):  An IV will be required for this test and Nitroglycerin will be given.  Hold all erectile dysfunction medications at least 3 days (72 hrs) prior to test. (Ie viagra, cialis, sildenafil, tadalafil, etc)   On the Night Before the Test: Be sure to Drink plenty of water. Do not consume any caffeinated/decaffeinated beverages or chocolate 12 hours prior to your test. Do not take any antihistamines 12 hours prior to your test.  On the Day of the Test: Drink plenty of water until 1 hour prior to the test. Do not eat any food 1 hour prior to test. You may take your regular medications prior to the test.  Take metoprolol (Lopressor) two hours prior to test. If you take Furosemide/Hydrochlorothiazide/Spironolactone, please HOLD on the morning of the test. FEMALES- please wear underwire-free bra if available, avoid dresses & tight clothing  After the Test: Drink plenty of water. After receiving IV contrast, you may experience a mild flushed feeling. This is normal. On occasion, you may experience a mild rash up to 24 hours after the test. This is not dangerous. If this occurs, you can take Benadryl 25 mg and increase your fluid intake. If you experience trouble breathing, this can be serious. If it is severe call 911 IMMEDIATELY. If it is mild, please call our office. If you take any of these medications: Glipizide/Metformin, Avandament, Glucavance, please do not take 48 hours after completing test unless otherwise instructed.  We will call to schedule your test 2-4 weeks  out understanding that some insurance companies will need an authorization prior to the service being performed.   For more information and frequently asked questions, please visit our website : [email or web address]  For non-scheduling related questions, please contact the cardiac imaging nurse navigator should you  have any questions/concerns: Cardiac Imaging Nurse Navigators Direct Office Dial: [phone]   For scheduling needs, including cancellations and rescheduling, please call [place], [phone].   Follow-Up: At [facility], you and your health needs are our priority.  As part of our continuing mission to provide you with exceptional heart care, we have created designated Provider Care Teams.  These Care Teams include your primary Cardiologist (physician) and Advanced Practice Providers (APPs -  Physician Assistants and Nurse Practitioners) who all work together to provide you with the care you need, when you need it.  We recommend signing up for the patient portal called "MyChart".  Sign up information is provided on this After Visit Summary.  MyChart is used to connect with patients for Virtual Visits (Telemedicine).  Patients are able to view lab/test results, encounter notes, upcoming appointments, etc.  Non-urgent messages can be sent to your provider as well.   To learn more about what you can do with MyChart, go to [email or web address].    Your next appointment:   2-3 month(s)  Provider:   Chilton Si, MD or Gillian Shields, NP

## 2022-12-13 NOTE — Progress Notes (Addendum)
Cardiology Office Note:  .   Date:  12/13/2022  ID:  Amanda Cannon, DOB 1945/05/21, MRN [id number] PCP: Eloisa Northern, MD  [facility] Providers Cardiologist:  Chilton Si, MD    History of Present Illness: .   Amanda Cannon is a 77 y.o. female with history of hypertension, polymyalgia rheumatica, hyperlipidemia, vertigo, Prinzmetal's angina, carotid artery stenosis, subclavian artery stenosis, aortic atherosclerosis.  Carotid duplex 10/2021 bilateral 1 to 39% stenosis. CT angio neck 04/2022 left carotid artery with 20% stenosis in ICA bulb, mild to moderate stenosis of the proximal left subclavian artery, aortic atherosclerosis.  She did Dr. Russella Dar 12/05/2022 noting intermittent chest pain agitation with meals or swallowing or exertion.  However did occur during barium esophagram.  She reported an episode of chest pain 6 weeks prior while walking slowly which is more severe across anterior chest which self resolved for 15 minutes.  She was asked to schedule sooner follow-up to discuss chest pain prior to clearance for EGD/colonoscopy.  Discussed the use of AI scribe software for clinical note transcription with the patient, who gave verbal consent to proceed.  Presents for preoperative clearance for EGD and colonoscopy. She reports chronic dizziness, which she attributes to a vestibular problem for which she follows with ENT. No near syncope, syncope. Notes chest pain with initial episode during a barium swallow. She reports one severe episode of chest pain approximately six weeks ago while in [facility] and had to hold onto a rail due to pain, eventually went to lay down and nurse checked on her. She declined ED evaluation. She has not had any episodes of severe chest pain since. She also reports a family history of CAD in both her mother and father.       ROS: Please see the history of present illness.    All other systems reviewed and are negative.   Studies  Reviewed: Marland Kitchen   EKG Interpretation Date/Time:  Tuesday December 13 2022 14:09:02 EST Ventricular Rate:  92 PR Interval:  144 QRS Duration:  68 QT Interval:  358 QTC Calculation: 442 R Axis:   5  Text Interpretation: Normal sinus rhythm Normal ECG Confirmed by Gillian Shields ([id number]) on 12/13/2022 2:15:14 PM    Cardiac Studies & Procedures     STRESS TESTS  MYOCARDIAL PERFUSION IMAGING 08/06/2013              Risk Assessment/Calculations:             Physical Exam:   VS:  BP 138/78   Pulse 91   Ht 4\' 10"  (1.473 m)   Wt 96 lb (43.5 kg)   SpO2 95%   BMI 20.06 kg/m    Wt Readings from Last 3 Encounters:  12/13/22 96 lb (43.5 kg)  12/08/22 97 lb (44 kg)  09/13/22 97 lb 2 oz (44.1 kg)    GEN: Well nourished, well developed in no acute distress NECK: No JVD; No carotid bruits CARDIAC: RRR, no murmurs, rubs, gallops RESPIRATORY:  Clear to auscultation without rales, wheezing or rhonchi  ABDOMEN: Soft, non-tender, non-distended EXTREMITIES:  No edema; No deformity   ASSESSMENT AND PLAN: .      Chest Pain / Prinzmetal Angina Prior prinzmetal angina episode many years ago waking her from sleep, this has not recurred. 6 weeks ago isolated episode of new onset, severe chest pain. None since that time. Family history of CAD in both parents. Known aortic atherosclerosis on prior imaging. EKG normal today. Discussed the  risks/benefits of cardiac CTA to evaluate for coronary artery disease. -Order Cardiac CTA to evaluate for coronary artery disease. -BMP today -Metoprolol tartrate 100mg  one hour prior to CTA.  -Aware of use of nitroglycerin during study.  -Continue Amlodipine 5mg  daily for blood pressure control.  Preoperative Clearance For upcoming EGD and colonoscopy. Will readdress once cardiac CTA obtained.  -Aspirin to be held 5-7 days prior to procedure. Addendum 01/06/23. Cardiac CTA minimal nonobstructive disease. Per AHA/ACC guidelines, she is deemed acceptable risk for  the planned procedure without additional cardiovascular testing. Will route to surgical team so they are aware.    Hyperlipidemia, LDL goal <70 Managed with Leqvio every six months. -Continue current management.  Hypertension Controlled on Amlodipine 5mg  daily, Lasix 20mg  daily -Continue current management.             Dispo: follow up in 2-3 months  Signed, Alver Sorrow, NP

## 2022-12-14 LAB — BASIC METABOLIC PANEL
BUN/Creatinine Ratio: 26 (ref 12–28)
BUN: 23 mg/dL (ref 8–27)
CO2: 23 mmol/L (ref 20–29)
Calcium: 10.1 mg/dL (ref 8.7–10.3)
Chloride: 97 mmol/L (ref 96–106)
Creatinine, Ser: 0.89 mg/dL (ref 0.57–1.00)
Glucose: 106 mg/dL — ABNORMAL HIGH (ref 70–99)
Potassium: 3.8 mmol/L (ref 3.5–5.2)
Sodium: 136 mmol/L (ref 134–144)
eGFR: 66 mL/min/{1.73_m2} (ref >59)

## 2022-12-28 ENCOUNTER — Telehealth (HOSPITAL_COMMUNITY): Payer: Self-pay | Admitting: *Deleted

## 2022-12-28 NOTE — Telephone Encounter (Signed)
Reaching out to patient to offer assistance regarding upcoming cardiac imaging study; pt verbalizes understanding of appt date/time, parking situation and where to check in, pre-test NPO status and medications ordered, and verified current allergies; name and call back number provided for further questions should they arise Hayley Sharpe RN Navigator Cardiac Imaging [facility] [phone] office [phone] cell  

## 2022-12-29 ENCOUNTER — Ambulatory Visit (HOSPITAL_COMMUNITY)
Admission: RE | Admit: 2022-12-29 | Discharge: 2022-12-29 | Disposition: A | Discharge status: Home / Self Care | Payer: Medicare Other | Source: Ambulatory Visit | Attending: Family | Admitting: Family

## 2022-12-29 ENCOUNTER — Encounter (HOSPITAL_COMMUNITY): Payer: Self-pay

## 2022-12-29 DIAGNOSIS — R072 Precordial pain: Secondary | ICD-10-CM | POA: Insufficient documentation

## 2022-12-29 DIAGNOSIS — R0789 Other chest pain: Secondary | ICD-10-CM | POA: Diagnosis not present

## 2022-12-29 MED ORDER — DILTIAZEM HCL 25 MG/5ML IV SOLN
10.0000 mg | Freq: Once | INTRAVENOUS | Status: AC
  Administered 2022-12-29: 10 mg via INTRAVENOUS

## 2022-12-29 MED ORDER — DILTIAZEM HCL 25 MG/5ML IV SOLN
INTRAVENOUS | Status: AC
  Filled 2022-12-29: qty 5

## 2022-12-29 MED ORDER — NITROGLYCERIN 0.4 MG SL SUBL: 0.8000 mg | SUBLINGUAL_TABLET | Freq: Once | SUBLINGUAL | Status: DC

## 2022-12-29 MED ORDER — NITROGLYCERIN 0.4 MG SL SUBL
SUBLINGUAL_TABLET | SUBLINGUAL | Status: AC
Start: 2022-12-29 — End: ?
  Filled 2022-12-29: qty 2

## 2022-12-30 ENCOUNTER — Other Ambulatory Visit (HOSPITAL_COMMUNITY): Payer: Self-pay | Admitting: Emergency Medicine

## 2022-12-30 ENCOUNTER — Other Ambulatory Visit (HOSPITAL_BASED_OUTPATIENT_CLINIC_OR_DEPARTMENT_OTHER): Payer: Self-pay

## 2022-12-30 DIAGNOSIS — R079 Chest pain, unspecified: Secondary | ICD-10-CM

## 2022-12-30 MED ORDER — IVABRADINE HCL 5 MG PO TABS
15.0000 mg | ORAL_TABLET | Freq: Once | ORAL | 0 refills | Status: AC
  Filled 2022-12-30: qty 3, 1d supply, fill #0

## 2022-12-30 NOTE — Progress Notes (Signed)
15mg  ivabradine prescribed for HR control for next CCTA attempt Rockwell Alexandria RN Navigator Cardiac Imaging [facility] [phone] Office  [phone] Cell

## 2023-01-04 ENCOUNTER — Ambulatory Visit (HOSPITAL_COMMUNITY)
Admission: RE | Admit: 2023-01-04 | Discharge: 2023-01-04 | Disposition: A | Discharge status: Home / Self Care | Payer: Medicare Other | Source: Ambulatory Visit | Attending: Family | Admitting: Family

## 2023-01-04 ENCOUNTER — Ambulatory Visit (HOSPITAL_COMMUNITY)
Admission: RE | Admit: 2023-01-04 | Discharge: 2023-01-04 | Disposition: A | Discharge status: Home / Self Care | Payer: Medicare Other | Source: Ambulatory Visit | Attending: Physician Assistant | Admitting: Physician Assistant

## 2023-01-04 ENCOUNTER — Other Ambulatory Visit: Payer: Self-pay | Admitting: Physician Assistant

## 2023-01-04 DIAGNOSIS — R079 Chest pain, unspecified: Secondary | ICD-10-CM | POA: Diagnosis not present

## 2023-01-04 DIAGNOSIS — I700 Atherosclerosis of aorta: Secondary | ICD-10-CM | POA: Insufficient documentation

## 2023-01-04 DIAGNOSIS — I878 Other specified disorders of veins: Secondary | ICD-10-CM

## 2023-01-04 DIAGNOSIS — Q2112 Patent foramen ovale: Secondary | ICD-10-CM | POA: Insufficient documentation

## 2023-01-04 DIAGNOSIS — I2510 Atherosclerotic heart disease of native coronary artery without angina pectoris: Secondary | ICD-10-CM | POA: Insufficient documentation

## 2023-01-04 DIAGNOSIS — I3481 Nonrheumatic mitral (valve) annulus calcification: Secondary | ICD-10-CM | POA: Diagnosis not present

## 2023-01-04 DIAGNOSIS — I872 Venous insufficiency (chronic) (peripheral): Secondary | ICD-10-CM | POA: Diagnosis not present

## 2023-01-04 DIAGNOSIS — Z452 Encounter for adjustment and management of vascular access device: Secondary | ICD-10-CM | POA: Diagnosis not present

## 2023-01-04 DIAGNOSIS — R072 Precordial pain: Secondary | ICD-10-CM | POA: Diagnosis not present

## 2023-01-04 MED ORDER — NITROGLYCERIN 0.4 MG SL SUBL
SUBLINGUAL_TABLET | SUBLINGUAL | Status: AC
  Filled 2023-01-04: qty 2

## 2023-01-04 MED ORDER — IOHEXOL 350 MG/ML SOLN
95.0000 mL | Freq: Once | INTRAVENOUS | Status: AC | PRN
  Administered 2023-01-04: 95 mL via INTRAVENOUS

## 2023-01-04 MED ORDER — LIDOCAINE HCL 1 % IJ SOLN
INTRAMUSCULAR | Status: AC
Start: 2023-01-04 — End: ?
  Filled 2023-01-04: qty 20

## 2023-01-04 MED ORDER — NITROGLYCERIN 0.4 MG SL SUBL
0.8000 mg | SUBLINGUAL_TABLET | Freq: Once | SUBLINGUAL | Status: AC
  Administered 2023-01-04: 0.8 mg via SUBLINGUAL

## 2023-01-04 MED ORDER — NITROGLYCERIN 0.4 MG SL SUBL
SUBLINGUAL_TABLET | SUBLINGUAL | Status: AC
Start: 2023-01-04 — End: ?
  Filled 2023-01-04: qty 2

## 2023-01-04 NOTE — Progress Notes (Signed)
Patient ID: Amanda Cannon, female   DOB: 1945/08/15, 77 y.o.   MRN: [id number] Ct heart stopped left arm iv infiltrated.arm swollen pt complaining of pain. Iv removed ice pack applied

## 2023-01-04 NOTE — Procedures (Signed)
PROCEDURE SUMMARY:  Successful placement of single lumen PICC line to left basilic vein. Length 36 cm Tip at lower SVC/RA No complications PICC capped Ready for use. EBL = trace  Please see full dictation in Imaging section for details.   Gwynneth Macleod PA-C 01/04/2023 3:46 PM

## 2023-01-04 NOTE — Progress Notes (Signed)
Patient ID: Amanda Cannon, female   DOB: 1946/01/01, 77 y.o.   MRN: [id number] Correction 1252 note iv and infiltration on right arm.

## 2023-01-04 NOTE — Progress Notes (Signed)
Patient ID: Amanda Cannon, female   DOB: 20-Dec-1945, 77 y.o.   MRN: [id number] Pt seen by IR physicians assistant Corrin Parker.

## 2023-01-04 NOTE — Progress Notes (Cosign Needed Addendum)
  Evaluation after Contrast Extravasation  Patient seen and examined immediately after contrast extravasation while in CT.  Exam: There is moderate swelling at the [facility] fossa  area.  There is mild erythema. There is mild discoloration. There are no blisters. There are no signs of decreased perfusion of the skin.  It is not warm to touch.  The patient has good ROM in fingers.  Radial pulse is normal.    Per contrast extravasation protocol, I have instructed the patient to keep an ice pack on the area for 20-60 minutes at a time for about 48 hours.   Keep arm elevated as much as possible.   The patient understands to call the radiology department if there is: - increase in pain or swelling - changed or altered sensation - ulceration or blistering - increasing redness - warmth or increasing firmness - decreased tissue perfusion as noted by decreased capillary refill or discoloration of skin - decreased pulses peripheral to site  Discussed with patient and she tells me she NEEDS this CT today or she will not be able to schedule an upcoming surgical procedure.  She has agreed to have a PICC placed for IV access for the CT.  Jerry Caras Colan Laymon PA-C 01/04/2023 3:53 PM

## 2023-01-05 ENCOUNTER — Telehealth: Payer: Self-pay | Admitting: Cardiovascular Disease

## 2023-01-05 NOTE — Telephone Encounter (Signed)
Pt is requesting a callback regarding an incident that happened yesterday while getting the CT done. She stated there was 6 failed IV's (her veins blew) and the last one blew while she was in the machine which caused her to start screaming. She stated it it took a moment before someone came into the room because she's assuming they didn't hear her. Her arm is now in pain and she just wanted to let the office what happened. She'd like to speak with a nurse regarding it. Please advise .

## 2023-01-05 NOTE — Telephone Encounter (Signed)
Patient identification verified by 2 forms. Marilynn Rail, RN    Called and spoke to patient  Patient states:   -swelling has decreased in arm/hand   -arm continues to have redness and bruising (improving)   -she is elevating arm per recommendations  -she outreached to report issue, noticed there was no documentation of incident that occurred   -would like to know why there was 6 attempts to place PICC, each time the PICC blew   -would like to know why it failed 6 times despite use of ultrasound   -a PICC was placed in the left arm despite previous history for lymph node dissection, does not even have BP checked in left arm    -on the final attempt she was placed in the machine, she was screaming and no one was there to help right away   -wanted her provider to be made aware of what happened  Informed patient message sent to provider Advised patient to monitor arm/symptoms (reviewed ED warning signs/precautions)  Patient verbalized understanding, no questions at this time

## 2023-01-06 ENCOUNTER — Encounter (HOSPITAL_BASED_OUTPATIENT_CLINIC_OR_DEPARTMENT_OTHER): Payer: Self-pay

## 2023-01-06 ENCOUNTER — Telehealth: Payer: Self-pay | Admitting: Gastroenterology

## 2023-01-06 ENCOUNTER — Encounter: Payer: Self-pay | Admitting: Gastroenterology

## 2023-01-06 NOTE — Telephone Encounter (Signed)
Patient was seen by Dr. Russella Dar in November. Prior to scheduling EGD/Colon, Dr. Russella Dar wanted patient to have cardiac work up for chest pain. Patient had CTA and has been cleared by Cardiology. Amanda Cannon, thank you for scheduling the procedures but patient has to have a pre-visit. She has not been instructed when she was seen in the office. Thanks.

## 2023-01-06 NOTE — Telephone Encounter (Signed)
Patient scheduled for double procedure. Please advise.

## 2023-01-09 ENCOUNTER — Other Ambulatory Visit (HOSPITAL_BASED_OUTPATIENT_CLINIC_OR_DEPARTMENT_OTHER): Payer: Self-pay

## 2023-01-12 ENCOUNTER — Ambulatory Visit (HOSPITAL_COMMUNITY): Admission: RE | Admit: 2023-01-12 | Payer: Medicare Other | Source: Ambulatory Visit

## 2023-01-13 NOTE — Telephone Encounter (Signed)
Chilton Si, MD  You; Regis Bill B, LPN2 minutes ago (2:12 PM)    Called and discussed the event and express my sympathy for what happened to her.  She had a terrible experience but is doing well now.  She appreciated the phone call.  Tiffany C. Duke Salvia, MD, [facility]  ____________________________  Noted  No further nursing outreach needed at this time

## 2023-01-17 DIAGNOSIS — J0140 Acute pansinusitis, unspecified: Secondary | ICD-10-CM | POA: Diagnosis not present

## 2023-01-17 DIAGNOSIS — J208 Acute bronchitis due to other specified organisms: Secondary | ICD-10-CM | POA: Diagnosis not present

## 2023-01-21 ENCOUNTER — Other Ambulatory Visit: Payer: Self-pay | Admitting: Allergy & Immunology

## 2023-02-02 ENCOUNTER — Telehealth: Payer: Self-pay

## 2023-02-02 NOTE — Telephone Encounter (Signed)
 Auth Submission: NO AUTH NEEDED Site of care: Site of care: [facility] Payer: medicare a/b & aarp supp Medication & CPT/J Code(s) submitted: Leqvio  (Inclisiran) J1306 Route of submission (phone, fax, portal):  Phone # Fax # Auth type: Buy/Bill Units/visits requested: 284mg  x 2 doses Reference number:  Approval from: 05/06/22 to 02/24/24

## 2023-02-06 ENCOUNTER — Ambulatory Visit: Payer: Medicare Other | Admitting: Family Medicine

## 2023-02-09 ENCOUNTER — Ambulatory Visit (AMBULATORY_SURGERY_CENTER): Payer: Medicare Other

## 2023-02-09 VITALS — Ht <= 58 in | Wt 98.0 lb

## 2023-02-09 DIAGNOSIS — R079 Chest pain, unspecified: Secondary | ICD-10-CM

## 2023-02-09 DIAGNOSIS — K219 Gastro-esophageal reflux disease without esophagitis: Secondary | ICD-10-CM

## 2023-02-09 DIAGNOSIS — Z860101 Personal history of adenomatous and serrated colon polyps: Secondary | ICD-10-CM

## 2023-02-09 DIAGNOSIS — Z800 Family history of malignant neoplasm of digestive organs: Secondary | ICD-10-CM

## 2023-02-09 MED ORDER — NA SULFATE-K SULFATE-MG SULF 17.5-3.13-1.6 GM/177ML PO SOLN: 1.0000 | Freq: Once | ORAL | 0 refills | Status: AC

## 2023-02-09 NOTE — Progress Notes (Signed)
 No egg or soy allergy known to patient  No issues known to pt with past sedation with any surgeries or procedures Patient denies ever being told they had issues or difficulty with intubation  No FH of Malignant Hyperthermia Pt is not on diet pills Pt is not on  home 02  Pt is not on blood thinners  Pt denies issues with constipation  No A fib or A flutter Have any cardiac testing pending--no  LOA: independent Prep: suprep   Patient's chart reviewed by Amanda Cannon CNRA prior to previsit and patient appropriate for the [facility].  Previsit completed and red dot placed by patient's name on their procedure day (on provider's schedule).     PV competed with patient. Prep instructions sent via mychart and home address. Goodrx coupon for [employer] provided to use for price reduction if needed.

## 2023-02-16 DIAGNOSIS — H401134 Primary open-angle glaucoma, bilateral, indeterminate stage: Secondary | ICD-10-CM | POA: Diagnosis not present

## 2023-02-23 ENCOUNTER — Telehealth: Payer: Self-pay | Admitting: Gastroenterology

## 2023-02-23 ENCOUNTER — Ambulatory Visit (INDEPENDENT_AMBULATORY_CARE_PROVIDER_SITE_OTHER): Payer: Medicare Other

## 2023-02-23 VITALS — BP 143/82 | HR 75 | Temp 98.3°F | Resp 18 | Ht <= 58 in | Wt 98.6 lb

## 2023-02-23 DIAGNOSIS — E782 Mixed hyperlipidemia: Secondary | ICD-10-CM | POA: Diagnosis not present

## 2023-02-23 MED ORDER — INCLISIRAN SODIUM 284 MG/1.5ML ~~LOC~~ SOSY
284.0000 mg | PREFILLED_SYRINGE | Freq: Once | SUBCUTANEOUS | Status: AC
  Administered 2023-02-23: 284 mg via SUBCUTANEOUS
  Filled 2023-02-23: qty 1.5

## 2023-02-23 NOTE — Telephone Encounter (Signed)
All questions answered, no further needs.

## 2023-02-23 NOTE — Progress Notes (Signed)
Diagnosis: Iron Deficiency Anemia  Provider:  Chilton Greathouse MD  Procedure: Injection  Leqvio (inclisiran), Dose: 284 mg, Site: subcutaneous, Number of injections: 1  Injection Site(s): Right arm  Post Care: Patient declined observation  Discharge: Condition: Good, Destination: Home . AVS Declined  Performed by:  Adriana Mccallum, RN

## 2023-02-23 NOTE — Telephone Encounter (Signed)
Attempt to reach pt to resolve question. LM with call back #

## 2023-02-23 NOTE — Telephone Encounter (Signed)
PT is calling to discuss information about EGD/colon. Such as what to look for and the type of equipment that will be used. Please advise.

## 2023-02-27 ENCOUNTER — Encounter (HOSPITAL_BASED_OUTPATIENT_CLINIC_OR_DEPARTMENT_OTHER): Payer: Self-pay | Admitting: Family

## 2023-02-27 ENCOUNTER — Ambulatory Visit (HOSPITAL_BASED_OUTPATIENT_CLINIC_OR_DEPARTMENT_OTHER): Payer: Medicare Other | Admitting: Family

## 2023-02-27 VITALS — BP 114/66 | HR 80 | Ht <= 58 in | Wt 99.0 lb

## 2023-02-27 DIAGNOSIS — I10 Essential (primary) hypertension: Secondary | ICD-10-CM

## 2023-02-27 DIAGNOSIS — I25118 Atherosclerotic heart disease of native coronary artery with other forms of angina pectoris: Secondary | ICD-10-CM

## 2023-02-27 DIAGNOSIS — E785 Hyperlipidemia, unspecified: Secondary | ICD-10-CM | POA: Diagnosis not present

## 2023-02-27 NOTE — Patient Instructions (Signed)
Medication Instructions:  Continue your current medications.   *If you need a refill on your cardiac medications before your next appointment, please call your pharmacy*  Testing/Procedures: Your CT scan showed minimal nonobstructive coronary artery disease.   Follow-Up: At [facility], you and your health needs are our priority.  As part of our continuing mission to provide you with exceptional heart care, we have created designated Provider Care Teams.  These Care Teams include your primary Cardiologist (physician) and Advanced Practice Providers (APPs -  Physician Assistants and Nurse Practitioners) who all work together to provide you with the care you need, when you need it.  We recommend signing up for the patient portal called "MyChart".  Sign up information is provided on this After Visit Summary.  MyChart is used to connect with patients for Virtual Visits (Telemedicine).  Patients are able to view lab/test results, encounter notes, upcoming appointments, etc.  Non-urgent messages can be sent to your provider as well.   To learn more about what you can do with MyChart, go to [email or web address].    Your next appointment:   As scheduled with Dr. Duke Salvia If all is well after your endoscopy, could consider moving this appointment out.

## 2023-02-27 NOTE — Progress Notes (Signed)
  Cardiology Office Note:  .   Date:  02/27/2023  ID:  Amanda Cannon, DOB [birth year], MRN [id number] PCP: Eloisa Northern, MD  [facility] Providers Cardiologist:  Chilton Si, MD    History of Present Illness: .   Amanda Cannon is a 78 y.o. female with history of hypertension, polymyalgia rheumatica, hyperlipidemia, vertigo, Prinzmetal's angina, nonobstructive CAD, carotid artery stenosis, subclavian artery stenosis, aortic atherosclerosis.  Carotid duplex 10/2021 bilateral 1 to 39% stenosis. CT angio neck 04/2022 left carotid artery with 20% stenosis in ICA bulb, mild to moderate stenosis of the proximal left subclavian artery, aortic atherosclerosis.  Seen 12/13/22 after episode of chest pain for which she was recommended for evaluation prior to clearance for endoscopy/colonoscopy. Cardiac CTA 01/05/23 calcium score 274 placing her in 72nd percentil with TPV 224mm3. There was 0-24% stenosis of LAD and LCx. Clearance was granted for colonoscopy and endoscopy.   Presents today for follow up with her husband. Reviewed CTA results and reassured by result. No recurrent chest pain. Endoscopy and colonoscopy upcoming later this month. Reports no exertional dyspnea, orthopnea, PND, edema. She notes occasional palpitations that self resolve and are overall not bothersome.   ROS: Please see the history of present illness.    All other systems reviewed and are negative.   Studies Reviewed: .           Risk Assessment/Calculations:             Physical Exam:   VS:  BP 114/66   Pulse 80   Ht 4\' 6"  (1.372 m)   Wt 99 lb (44.9 kg)   SpO2 94%   BMI 23.87 kg/m    Wt Readings from Last 3 Encounters:  02/27/23 99 lb (44.9 kg)  02/23/23 98 lb 9.6 oz (44.7 kg)  02/09/23 98 lb (44.5 kg)    GEN: Well nourished, well developed in no acute distress NECK: No JVD; No carotid bruits CARDIAC: RRR, no murmurs, rubs, gallops RESPIRATORY:  Clear to auscultation without rales, wheezing or  rhonchi  ABDOMEN: Soft, non-tender, non-distended EXTREMITIES:  No edema; No deformity   ASSESSMENT AND PLAN: .      Nonobstructive CAD / Prinzmetal Angina Minimal 0-25% stenosis of LAD and LCx by CTA 12/2022. Continue GDMT aspirin 81mg  daily, Leqvio.   Hyperlipidemia, LDL goal <70 Managed with Leqvio every six months. -Continue current management.  Hypertension Controlled on Amlodipine 5mg  daily, Lasix 20mg  daily -Continue current management.             Dispo: follow up as scheduled with Dr. Duke Salvia per her preference. However, would be reasonable to move out to 1 year follow up if she prefers.   Signed, Alver Sorrow, NP

## 2023-02-28 ENCOUNTER — Ambulatory Visit (INDEPENDENT_AMBULATORY_CARE_PROVIDER_SITE_OTHER): Payer: Medicare Other | Admitting: Allergy & Immunology

## 2023-02-28 ENCOUNTER — Encounter: Payer: Self-pay | Admitting: Allergy & Immunology

## 2023-02-28 VITALS — BP 120/70 | HR 62 | Temp 97.5°F | Resp 18 | Ht <= 58 in | Wt 99.0 lb

## 2023-02-28 DIAGNOSIS — K219 Gastro-esophageal reflux disease without esophagitis: Secondary | ICD-10-CM

## 2023-02-28 DIAGNOSIS — J310 Chronic rhinitis: Secondary | ICD-10-CM

## 2023-02-28 DIAGNOSIS — R053 Chronic cough: Secondary | ICD-10-CM

## 2023-02-28 NOTE — Progress Notes (Signed)
 FOLLOW UP  Date of Service/Encounter:  02/28/23   Assessment:   Cough - with previously normal spirometry   Chronic rhinitis (molds) - consider intradermal testing in the future   Gastroesophageal reflux disease   Vertigo -previously followed by the vestibular rehab program with Dr. Karis Dearth local reaction to RSV vaccine  Hyperlipidemia - on Leqvio  injection with good results  Plan/Recommendations:   1. Cough with postnasal drip (sensitization to molds) - Continue with cetrizine 10mg  twice daily.  - Continue with fluticasone  one spray per nostril nightly as needed.  - Try using Ayr nasal saline as needed (this is a saline gel that remains in your nostril).   2. Gastroesophageal reflux disease - Continue with famotidine  40mg  up to twice daily.   3. Return in about 1 year (around 02/28/2024). You can have the follow up appointment with Dr. Iva or a Nurse Practicioner (our Nurse Practitioners are excellent and always have Physician oversight!).   Subjective:   Amanda Cannon is a 78 y.o. female presenting today for follow up of  Chief Complaint  Patient presents with   Nasal Congestion    ORIYA Cannon has a history of the following: Patient Active Problem List   Diagnosis Date Noted   Anxiety    Wax in ear 06/21/2022   Hypercalcemia 05/03/2022   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   Vestibular disequilibrium 09/06/2021   DDD (degenerative disc disease), cervical 06/22/2021   History of tamoxifen therapy 05/13/2020   Increased risk of breast cancer 05/13/2020   Degenerative cervical disc 01/01/2020   Vertigo 08/06/2019   Cough 08/06/2019   Mixed rhinitis 08/06/2019   Gastroesophageal reflux disease 08/06/2019   Vestibular disorder 04/09/2019   Bilateral hearing loss 03/23/2018   ETD (Eustachian tube dysfunction), bilateral 03/23/2018   Facial numbness 03/23/2018   Age-related osteoporosis without current pathological fracture 01/15/2018    Breast pain, left 01/15/2018   Malignant neoplasm of lower-outer quadrant of left breast of female, estrogen receptor positive (HCC) 01/15/2018   Trigeminal neuropathy 11/13/2017   Pain in right hand 05/25/2017   PMR (polymyalgia rheumatica) (HCC) 04/24/2017   Hyponatremia 07/15/2015   Hyperlipidemia 07/15/2015   Esophageal spasm 07/15/2015   Essential hypertension 07/15/2015   Depression 07/15/2015   Asthma 07/15/2015   Tinnitus 07/15/2015   Osteoporosis 07/15/2015   Spasm    Prinzmetal angina (HCC)    Mitral valve prolapse    White matter changes    Osteoarthritis 01/24/2013    History obtained from: chart review and patient and her husband .  Discussed the use of AI scribe software for clinical note transcription with the patient and/or guardian, who gave verbal consent to proceed.  Amanda Cannon is a 78 y.o. female presenting for a follow up visit. She was last seen in February 2024.  At that time, we continue with cetirizine  and Astelin  for her postnasal drip and cough.  For her GERD, we continue with famotidine  40 mg up to twice daily.   She does have a history of polymyalgia rheumatica.  She also has open-angle glaucoma and follows with ophthalmology at [facility].  Since last visit, she has largely done pretty well.  She and her husband were recently featured in the [employer].  The story was about how they met and how they stayed married for so long.  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  They continue to volunteer at the [facility] and are hoping to usher at  the [employer] performance.  Asthma/Respiratory Symptom History: She did have bronchitis recently around 2 to 3 weeks ago.  She does not use albuterol, as she was told that she should not take it by a pharmacist for unclear reasons.  Her husband has albuterol and he will give her some to see if it helps.  Allergic Rhinitis Symptom History: Florita presents with persistent postnasal drip and nasal  dryness. She experiences persistent postnasal drip and nasal dryness, which have worsened with the cold weather. Her nasal passages feel dry, complicating the use of nasal sprays. She has tried Flonase  and azelastine , both of which were ineffective due to the dryness they caused. She stopped her Astelin  because it was drying out her nostrils too much. She uses hearing aids but is dissatisfied with their performance. She notes that removing them leaves one ear feeling 'dead' and that they amplify all sounds, which can be undesirable.  GERD Symptom History: She remains on a reflux medication.  She denies any issues with breakthrough symptoms.  She recently started a new cholesterol injection, Leqvio , after previously using Praluent  and Repatha , due to insurance requirements. She has a family history of atherosclerosis, with both parents having died from the condition. Her lipid levels are described as very large and numerous.  Therefore, she is working hard to control her elevated lipids.  Otherwise, there have been no changes to her past medical history, surgical history, family history, or social history.    Review of systems otherwise negative other than that mentioned in the HPI.    Objective:   Blood pressure 120/70, pulse 62, temperature (!) 97.5 F (36.4 C), temperature source Temporal, resp. rate 18, height 4' 6 (1.372 m), weight 99 lb (44.9 kg), SpO2 98%. Body mass index is 23.87 kg/m.    Physical Exam Vitals reviewed.  Constitutional:      Appearance: She is well-developed.     Comments: Delightful.  Pleasant.  Very talkative.  HENT:     Head: Normocephalic and atraumatic.     Right Ear: Tympanic membrane, ear canal and external ear normal.     Left Ear: Tympanic membrane, ear canal and external ear normal.     Nose: Rhinorrhea present. No nasal deformity, septal deviation or mucosal edema.     Right Turbinates: Pale. Not enlarged or swollen.     Left Turbinates: Pale. Not  enlarged or swollen.     Right Sinus: No maxillary sinus tenderness or frontal sinus tenderness.     Left Sinus: No maxillary sinus tenderness or frontal sinus tenderness.     Comments: Some dried rhinorrhea present.  There is clear rhinorrhea in the right side with crusting on the left side.  No epistaxis.    Mouth/Throat:     Mouth: Mucous membranes are not pale and not dry.     Pharynx: Uvula midline.  Eyes:     General:        Right eye: No discharge.        Left eye: No discharge.     Conjunctiva/sclera: Conjunctivae normal.     Right eye: Right conjunctiva is not injected. No chemosis.    Left eye: Left conjunctiva is not injected. No chemosis.    Pupils: Pupils are equal, round, and reactive to light.  Cardiovascular:     Rate and Rhythm: Normal rate and regular rhythm.     Heart sounds: Normal heart sounds.  Pulmonary:     Effort: Pulmonary effort is normal. No tachypnea,  accessory muscle usage or respiratory distress.     Breath sounds: Normal breath sounds. No wheezing, rhonchi or rales.  Chest:     Chest wall: No tenderness.  Lymphadenopathy:     Cervical: No cervical adenopathy.  Skin:    Coloration: Skin is not pale.     Findings: No abrasion, erythema, petechiae or rash. Rash is not papular, urticarial or vesicular.  Neurological:     Mental Status: She is alert.  Psychiatric:        Behavior: Behavior is cooperative.      Diagnostic studies: none      Marty Shaggy, MD  [facility] 

## 2023-02-28 NOTE — Patient Instructions (Addendum)
 1. Cough with postnasal drip (sensitization to molds) - Continue with cetrizine 10mg  twice daily.  - Continue with fluticasone  one spray per nostril nightly as needed.  - Try using Ayr nasal saline as needed (this is a saline gel that remains in your nostril).      2. Gastroesophageal reflux disease - Continue with famotidine  40mg  up to twice daily.   3. Return in about 1 year (around 02/28/2024). You can have the follow up appointment with Dr. Iva or a Nurse Practicioner (our Nurse Practitioners are excellent and always have Physician oversight!).    Please inform us  of any Emergency Department visits, hospitalizations, or changes in symptoms. Call us  before going to the ED for breathing or allergy  symptoms since we might be able to fit you in for a sick visit. Feel free to contact us  anytime with any questions, problems, or concerns.  It was a pleasure to see you and your family again today!  Websites that have reliable patient information: 1. American Academy of Asthma, Allergy , and Immunology: www.aaaai.org 2. Food Allergy  Research and Education (FARE): foodallergy.org 3. Mothers of Asthmatics: http://www.asthmacommunitynetwork.org 4. [employer] of Allergy , Asthma, and Immunology: www.acaai.org      "Like" us  on Facebook and Instagram for our latest updates!      A healthy democracy works best when [employer] participate! Make sure you are registered to vote! If you have moved or changed any of your contact information, you will need to get this updated before voting! Scan the QR codes below to learn more!

## 2023-03-09 ENCOUNTER — Ambulatory Visit: Payer: Medicare Other | Admitting: Internal Medicine

## 2023-03-09 ENCOUNTER — Encounter: Payer: Self-pay | Admitting: Internal Medicine

## 2023-03-09 ENCOUNTER — Other Ambulatory Visit: Payer: Self-pay | Admitting: Allergy & Immunology

## 2023-03-09 VITALS — BP 120/72 | HR 76 | Temp 97.9°F | Resp 18 | Wt 99.0 lb

## 2023-03-09 DIAGNOSIS — M353 Polymyalgia rheumatica: Secondary | ICD-10-CM

## 2023-03-09 DIAGNOSIS — I2510 Atherosclerotic heart disease of native coronary artery without angina pectoris: Secondary | ICD-10-CM

## 2023-03-09 DIAGNOSIS — J310 Chronic rhinitis: Secondary | ICD-10-CM

## 2023-03-09 DIAGNOSIS — M150 Primary generalized (osteo)arthritis: Secondary | ICD-10-CM | POA: Diagnosis not present

## 2023-03-09 DIAGNOSIS — H832X3 Labyrinthine dysfunction, bilateral: Secondary | ICD-10-CM

## 2023-03-09 DIAGNOSIS — Z853 Personal history of malignant neoplasm of breast: Secondary | ICD-10-CM

## 2023-03-09 DIAGNOSIS — M810 Age-related osteoporosis without current pathological fracture: Secondary | ICD-10-CM

## 2023-03-09 DIAGNOSIS — Z0000 Encounter for general adult medical examination without abnormal findings: Secondary | ICD-10-CM | POA: Diagnosis not present

## 2023-03-09 DIAGNOSIS — I700 Atherosclerosis of aorta: Secondary | ICD-10-CM | POA: Insufficient documentation

## 2023-03-09 DIAGNOSIS — R053 Chronic cough: Secondary | ICD-10-CM | POA: Diagnosis not present

## 2023-03-09 DIAGNOSIS — K219 Gastro-esophageal reflux disease without esophagitis: Secondary | ICD-10-CM

## 2023-03-09 DIAGNOSIS — E782 Mixed hyperlipidemia: Secondary | ICD-10-CM | POA: Diagnosis not present

## 2023-03-09 DIAGNOSIS — I6529 Occlusion and stenosis of unspecified carotid artery: Secondary | ICD-10-CM

## 2023-03-09 DIAGNOSIS — G4700 Insomnia, unspecified: Secondary | ICD-10-CM

## 2023-03-09 DIAGNOSIS — Z6823 Body mass index (BMI) 23.0-23.9, adult: Secondary | ICD-10-CM | POA: Diagnosis not present

## 2023-03-09 DIAGNOSIS — I10 Essential (primary) hypertension: Secondary | ICD-10-CM | POA: Diagnosis not present

## 2023-03-09 DIAGNOSIS — Z1331 Encounter for screening for depression: Secondary | ICD-10-CM

## 2023-03-09 HISTORY — DX: Occlusion and stenosis of unspecified carotid artery: I65.29

## 2023-03-09 HISTORY — DX: Insomnia, unspecified: G47.00

## 2023-03-09 HISTORY — DX: Atherosclerosis of aorta: I70.0

## 2023-03-09 HISTORY — DX: Atherosclerotic heart disease of native coronary artery without angina pectoris: I25.10

## 2023-03-09 MED ORDER — ZOSTER VAC RECOMB ADJUVANTED 50 MCG/0.5ML IM SUSR: 0.5000 mL | Freq: Once | INTRAMUSCULAR | 0 refills | Status: DC

## 2023-03-09 MED ORDER — PNEUMOVAX 23 25 MCG/0.5ML IJ SOLN: 0.5000 mL | Freq: Once | INTRAMUSCULAR | 0 refills | Status: AC

## 2023-03-09 MED ORDER — SHINGRIX 50 MCG/0.5ML IM SUSR: 0.5000 mL | Freq: Once | INTRAMUSCULAR | 0 refills | Status: AC

## 2023-03-09 NOTE — Assessment & Plan Note (Signed)
Continue on ativan at this time.

## 2023-03-09 NOTE — Assessment & Plan Note (Signed)
She has non-obstructive CAD where she is followed by cardiology.  Continue with risk factor modification.  She denies any anginal complaints today.

## 2023-03-09 NOTE — Addendum Note (Signed)
Addended byRosalie Doctor on: 03/09/2023 03:20 PM   Modules accepted: Orders

## 2023-03-09 NOTE — Assessment & Plan Note (Signed)
Her t-score has improved over the years.  She has been on prolia since 2011.  She gets these injections by her rheumatologist.  Continue weight bearing exercises and calcium and Vit D.

## 2023-03-09 NOTE — Assessment & Plan Note (Signed)
She can use alleve as needed or tylenol as needed.

## 2023-03-09 NOTE — Assessment & Plan Note (Signed)
Continue on cetrizine and flonase as needed.

## 2023-03-09 NOTE — Assessment & Plan Note (Signed)
This is stable without changes.  She uses meclizine as needed.  She can followup with Mr. Vincente Liberty.

## 2023-03-09 NOTE — Assessment & Plan Note (Signed)
She had a recent CTA of her head and neck in 04/2022.  She is followed by cardiology.

## 2023-03-09 NOTE — Assessment & Plan Note (Signed)
She is doing excellent with activity and exercises.

## 2023-03-09 NOTE — Assessment & Plan Note (Signed)
She has CAD, non-obstructive CAD, and carotid stenosis.  Continue risk factor modification.  Her goal LDL <70 and continue with ASA.

## 2023-03-09 NOTE — Assessment & Plan Note (Signed)
She remains cancer free and sees Hematology/Oncology in 04/2023.

## 2023-03-09 NOTE — Assessment & Plan Note (Signed)
Her LDL goal is <70.  She has been on repatha and praulent in the past.  Continue on leqvio.

## 2023-03-09 NOTE — Progress Notes (Signed)
Preventive Screening-Counseling & Management     ERICHA Cannon is a 78 y.o. female who presents for Medicare Annual/Subsequent preventive examination.  Her last eye exam was done about 6 months where she is also followed by ophthalmology for glaucoma.  She is on a number of drops for this and her last visit with ophthalmology was on 02/16/2023.  Her last colonoscopy was done on 09/2017 and this showed diveticulosis of the left colon.  They want her to repeat a colonoscopy in 5 years and she is set up for a colonoscopy on 03/13/2023.  She had chest pain this past year and saw GI in 11/2022 where they did a barium esophagram January 2024.  This was significant for mild dysmotility of the lower 1/3 of the esophagus and mild gastroesophageal reflux.  Her barium tablet initially became stuck near the aortic notch, after multiple sips of water and barium the tablet then passed another approximately 6 cm distally before again becoming stuck. The patient began to experience chest pain symptoms consistent with her initial complaint while the pill was at this level. They plan to do an EGD at the sane time next week as her colonoscopy.  She remains on pepcid at night at this time.  Her last digital screening mammogram was done on 08/18/2022 and was normal.  She has had breast cancer in the past (see below).  She had a total hysterectomy about 20 year ago.  The patient does exercise with boxing, yoga, tai-chi and walking.  She does not smoke.  The patient usually gets yearly flu vaccines.  She has not had the pneumovax 23 vaccine from what I can tell.  She had a prevnar 13 vaccine done on 07/25/2023 and a prevnar 20 vaccine 02/28/2020.  She had a zostavax vaccine done years ago but has not had the shingrix.  She is not interested in the RSV vaccine.  She has had 5 COVID-19 vaccines including 3 boosters.  The patient denies any depression, anxiety or memory loss.  The patient is on an ASA 81mg  daily.  The patient does  have a history of breast cancer where she sees Hem/Onc yearly for followup with last visit in 04/2022.  She was diagnosed with breast cancer in 1998 with a  malignant neoplasm of lower-outer quadrant of left breast of female that was ER positive.  She has [employer]. She was diagnosed in 1998 with a left-sided breast cancer that from the best we can determine was predominantly DCIS with a small area of invasion. She underwent a lumpectomy with an axillary lymph node dissection with removal of 23 lymph nodes and received radiation therapy and 5 years of tamoxifen. She underwent BRCA1 and BRCA2 gene testing and her daughter subsequently underwent a panel that was negative. She is of [place] and [place] heritage. She had her mammogram done in 07/2022 that was normal and she goes back this 04/2023 for followup.  Today, I had the pleasure of seeing the patient for further evaluation of her breast cancer. Her major concern is her terrible intermittent vertigo versus silent migraines and she wanted to know if I had an ENT who specializes that unfortunately I do not. She has also suffered with fibromyalgia rheumatica in the past and has osteoporosis and her bone density study on 04/08/2022 showed improvement on Prolia. She had a normal mammogram on 08/16/2021. She was treated with lumpectomy radiation therapy and tamoxifen and continues to have intermittent pain in her left axilla and  left breast and short of her surgical procedure I do not know of anything else we can do. Her blood pressure is adequately controlled at 127/75 and her weight is 97 pounds with a BMI of 21.60. She is not complaining of shortness of breath or cough and her husband has recently been diagnosed with chronic renal insufficiency and has had a fistula placed but has thus far been able to avoid dialysis  The patient is a 78 year old female who presents for a follow-up evaluation of hypertension.  She was diagnosed with HTN in her  late 34's.  The patient has not been checking her blood pressure at home.. The patient's current medications include: amlodipine 5mg  daily and lasix 20mg  daily. The patient has been tolerating her medications well. The patient denies any headache, visual changes, dchest pain, shortness of breath, weakness/numbness, and edema. She reports there have been no other symptoms noted.   The patient also returns today for routine followup on her cholesterol. The patient does have a history of hyperlipidemia, Prinzmetal's Angina, and less than 39% bilateral carotid obstruction 2018.  She had a Bilateral carotid duplex 10/2021 which revealed 1-39% stenosis in the right and left ICA, Antegrade LVA and atypical antegrade RVA.Overall, she states she is doing well and is without any complaints or problems at this time.  She has been on statins in the past with lipitor causing upset stomach.  She has been put on praulent but this was changed to repatha by her insurance.  She was on repatha but was having problems with the shot causing pain by the delivery system.  Her insurance switched her to leqvio 284mg  subcut twice a year.  She specifically denies abdominal pain, nausea, vomiting, diarrhea, myalgias, and fatigue. She remains on dietary management as well as a regular exercise program and leqvio 284mg  twice a year. She is fasting in anticipation for labs today.   This patient also has a history of non-obstructive CAD and presents today for a status visit.  She had chest pain while in the gym in 11/2022.  She saw her GI for her colonoscopy/EGD and she was referred to cardiology to be cleared for these procedures.  They performed a cardiac CTA on 01/05/23 calcium score 274 placing her in 72nd percentil with TPV 278mm3. There was 0-24% stenosis of LAD and LCx. Clearance was granted for colonoscopy and endoscopy. They felt she had nonobstructive CAD / Prinzmetal Angina and recommended to continue her ASA and leqvio with goal  LDL <70.  She was also noted on cardiac CTA to have aortic atherosclerosis.  Her CAD is controlled with therapy as summarized in the medication list and previous notes. The patient has no comorbid conditions. She has the following baseline symptoms: exertional dyspnea, chest pain, and palpitations. She has the following modifiable risk factor(s): HTN, and hyperlipidemia. Specifically denied complaint(s): chest pain or SOB with exertion, palpitations, orthopnea, edema, exertional dyspnea, and syncope. Interval studies: none.  She last saw cardiology on 02/27/2023.  She had a Carotid duplex on 10/2021 bilateral 1-39% stenosis. CT angio neck 04/2022 left carotid artery with 20% stenosis in ICA bulb, mild to moderate stenosis of the proximal left subclavian artery, aortic atherosclerosis.  The patient is a 78 year old female,  post-menopausal, who presents for followup of her osteoporosis.  She states that she was diagnosed years ago with osteoporosis with a t-score on her initial bone density over 4.  She was on fosamax which they noted did not work.  She was placed on prolia around 2011.  She has no specific complaints related to bone loss.  She denies a family history of osteoporosis. The following risk factors are noted: she was on prednisone for a prolonged period of time before her diagnosis of osteoporosis where she was on prednisone for polymyalgia rheumatica.   She denies the following: diabetes mellitus, high caffeine intake, alcohol consumption of more that 7 ounces per week, daily prednisone use, hyperthyroidism, surgical resection of her bowel, and surgical resection of her stomach. She states she exercises routinely. Her last bne density on 04/08/2022 and t-score was -2.1.  Her last dose of prolia was done in 04/2022.    The patient was also diagnosed with PMR years ago where she is currently followed by Dr. Dierdre Forth in Rheumatology.  She states she was seen by rheumatology in 09/2022 and they noted that she  was sympatomic with her PMR and had an ESR >90.  They started her on prednisone 30mg  daily and they have been tapering her dose where she stopped this after a week.  Her pain and swelling involving her right hip/leg have improved.   She has a history of vestibular disequilibrium which started about 8 years ago.  She saw a specialist at [facility] in 10/2021 and has seen neurology in the past for this.  The patient has undergone thorough vestibular testing which was negative. Neurology had recommended she undergo vestibular rehab, she did 12 weeks with minimal benefit per the patient.  Today, when she turns her head either way, she gets dizzy.  She does not really have dizziness with superior/inferior motion. Her sensation is that she feels off balance, without spinning sensation and the episodes will last about 4-5 seconds.  This happens every time she turns her head, it does not happen otherwise. If she is perfectly still, there is no imbalance. There are no symptoms when she is turning in bed. She will get lightheaded at times when she stands up but she is not sure if it is related. She had a sinus CT scan in 2021 which was negative for sinus disease. She has no history of migraines. She sometimes has slight double vision. She reports numbness from her left ear to across her cheek to her left upper eye lid.  She does have tinnitus and she has hearing loss.  She has seen audiology and is going for hearing aids.   Notably, she has a younger-adult onset of white matter lesions. She has a family history of MS, but she herself does not have MS. She denies migraine history.She had a vestibular exam results provided by Dr. Suszanne Conners and noted by Dr. Gust Rung at [facility].  She has been started on betahistine in the past but this raised her intraocular pressure with her glaucoma.   She is currently on meclizine 25mg  daily.  She has had a brain MRI and vestibular testing at [facility].  She had a discussion with a physician friend here at  town and she wants to be referred to Ermalinda Barrios at [facility] which she has not seen.  The patient also has a history of chronic cough where she is followed by allergy/immunology.  She has cough secondary to postnasal drip (sensitization to molds).  She is currently on cetrizine 10mg  twice daily and flonase one spray each nostril nightly as needed.   The patient also has a history of arthritis in her fingers, wrists, shoulders, neck, back, hips and her feet.  She will take aspirin  as needed and alleve as needed at night.  She has a history of insomnia and has taken po lorazepam at night for sleep since she was 78 years of age.       Are there smokers in your home (other than you)? No  Risk Factors Current exercise habits:  as above   Dietary issues discussed: none   Depression Screen (Note: if answer to either of the following is "Yes", a more complete depression screening is indicated)   Over the past two weeks, have you felt down, depressed or hopeless? No  Over the past two weeks, have you felt little interest or pleasure in doing things? No  Have you lost interest or pleasure in daily life? No  Do you often feel hopeless? No  Do you cry easily over simple problems? No  Activities of Daily Living In your present state of health, do you have any difficulty performing the following activities?:  Driving? No Managing money?  No Feeding yourself? No Getting from bed to chair? No Climbing a flight of stairs? No Preparing food and eating?: No Bathing or showering? No Getting dressed: No Getting to the toilet? No Using the toilet:No Moving around from place to place: No In the past year have you fallen or had a near fall?:No   Are you sexually active?  No  Do you have more than one partner?  No  Hearing Difficulties: Yes Do you often ask people to speak up or repeat themselves? Yes Do you experience ringing or noises in your ears? Yes Do you have difficulty understanding  soft or whispered voices? Yes   Do you feel that you have a problem with memory? No  Do you often misplace items? No  Do you feel safe at home?  Yes  Cognitive Testing  Alert? Yes  Normal Appearance?Yes  Oriented to person? Yes  Place? Yes   Time? Yes  Recall of three objects?  refused  Can perform simple calculations? refused  Displays appropriate judgment?Yes  Can read the correct time from a watch face?Yes  Refused MMSE today.  Fall Risk Prevention  Any stairs in or around the home? Yes  If so, are there any without handrails? Yes  Home free of loose throw rugs in walkways, pet beds, electrical cords, etc? Yes  Adequate lighting in your home to reduce risk of falls? Yes  Use of a cane, walker or w/c? No    Time Up and Go  Was the test performed? No     Advanced Directives have been discussed with the patient? Yes   List the Names of Other Physician/Practitioners you currently use: Patient Care Team: Eloisa Northern, MD as PCP - General (Internal Medicine) Chilton Si, MD as PCP - Cardiology (Cardiology) Dagoberto Ligas, MD as Consulting Physician (Nephrology)    Past Medical History:  Diagnosis Date   Alcohol use    Anemia    Anxiety    Asthma    Basal cell carcinoma    forehead   Breast cancer (HCC) 1998   left    Colon polyps    GERD (gastroesophageal reflux disease)    Glaucoma    Hyperlipidemia    Hypertension    Hyponatremia    remote, mild   Mitral valve prolapse    Osteoarthritis    sees rheumatologist   Osteoporosis    sees rheumatologist   Personal history of radiation therapy 1998   PMR (polymyalgia rheumatica) [facility])    sees rheumatologist  Pneumonia    Prinzmetal angina (HCC)    White matter changes    ? ischemic sm vs per pt    Past Surgical History:  Procedure Laterality Date   ABDOMINAL HYSTERECTOMY     BREAST LUMPECTOMY Left 1998   cancer   CESAREAN SECTION     x 4   COLONOSCOPY  07/2012   in [facility]   HERNIA REPAIR  2005    abdominal hernia repair   HYSTEROTOMY     LYMPH NODE DISSECTION Left    POLYPECTOMY        Current Medications  Current Outpatient Medications  Medication Sig Dispense Refill   fluticasone (FLONASE) 50 MCG/ACT nasal spray SPRAY 2 SPRAYS INTO EACH NOSTRIL EVERY DAY 48 mL 0   amLODipine (NORVASC) 5 MG tablet Take 1 tablet (5 mg total) by mouth daily. 90 tablet 3   aspirin 81 MG tablet Take 81 mg by mouth daily.     cetirizine (ZYRTEC) 10 MG tablet Take 1 tablet (10 mg total) by mouth 2 (two) times daily. 60 tablet 5   Cholecalciferol 25 MCG (1000 UT) tablet Take by mouth.     denosumab (PROLIA) 60 MG/ML SOLN injection Inject 60 mg into the skin every 6 (six) months. Administer in upper arm, thigh, or abdomen     dorzolamide (TRUSOPT) 2 % ophthalmic solution Apply to eye.     famotidine (PEPCID) 40 MG tablet TAKE 1 TABLET BY MOUTH EVERYDAY AT BEDTIME 90 tablet 0   furosemide (LASIX) 20 MG tablet Take 20 mg by mouth daily.     inclisiran (LEQVIO) 284 MG/1.5ML SOSY injection Inject 284 mg into the skin once.     latanoprost (XALATAN) 0.005 % ophthalmic solution 1 drop at bedtime.     LORazepam (ATIVAN) 1 MG tablet Take 1 mg by mouth at bedtime as needed (spasms).     magnesium oxide (MAG-OX) 400 MG tablet Take by mouth as needed.     meclizine (ANTIVERT) 25 MG tablet Take 1 tablet (25 mg total) by mouth 2 (two) times daily as needed for dizziness. 60 tablet 2   naproxen sodium (ALEVE) 220 MG tablet 1 tablet as needed     No current facility-administered medications for this visit.    Allergies Acetaminophen, Beta adrenergic blockers, Tetrahydrozoline-zn sulfate, Erythromycin, Turmeric, Betamethasone, Erythromycin base, Other, and Sulfa antibiotics   Social History Social History   Tobacco Use   Smoking status: Former    Current packs/day: 0.00    Average packs/day: 1 pack/day for 7.0 years (7.0 ttl pk-yrs)    Types: Cigarettes    Start date: 01/25/1964    Quit date: 01/25/1971     Years since quitting: 52.1   Smokeless tobacco: Never  Substance Use Topics   Alcohol use: Yes    Alcohol/week: 14.0 standard drinks of alcohol    Types: 14 Standard drinks or equivalent per week    Comment: two drinks with burbon + water daily      Review of Systems Review of Systems  Constitutional:  Negative for chills, fever and malaise/fatigue.  Eyes:  Negative for blurred vision and double vision.  Respiratory:  Positive for cough. Negative for hemoptysis, sputum production, shortness of breath and wheezing.   Cardiovascular:  Negative for chest pain, palpitations and leg swelling.  Gastrointestinal:  Negative for abdominal pain, blood in stool, constipation, diarrhea, heartburn, melena, nausea and vomiting.  Genitourinary:  Negative for frequency and hematuria.  Musculoskeletal:  Positive for back pain and neck  pain. Negative for falls and myalgias.  Skin:  Negative for itching and rash.  Neurological:  Positive for dizziness. Negative for focal weakness, weakness and headaches.  Endo/Heme/Allergies:  Negative for polydipsia.  Psychiatric/Behavioral:  Negative for depression, memory loss and substance abuse. The patient has insomnia. The patient is not nervous/anxious.      Physical Exam:      Body mass index is 23.87 kg/m. BP 120/72 (BP Location: Left Arm, Patient Position: Sitting, Cuff Size: Normal)   Pulse 76   Temp 97.9 F (36.6 C)   Resp 18   Wt 99 lb (44.9 kg)   SpO2 97%   BMI 23.87 kg/m   Physical Exam Constitutional:      Appearance: Normal appearance. She is not ill-appearing.  HENT:     Head: Normocephalic and atraumatic.     Right Ear: Tympanic membrane, ear canal and external ear normal.     Left Ear: Tympanic membrane, ear canal and external ear normal.     Nose: Nose normal. No congestion or rhinorrhea.     Mouth/Throat:     Mouth: Mucous membranes are moist.     Pharynx: Oropharynx is clear. No posterior oropharyngeal erythema.  Eyes:      General: No scleral icterus.    Conjunctiva/sclera: Conjunctivae normal.     Pupils: Pupils are equal, round, and reactive to light.  Neck:     Thyroid: No thyromegaly.     Vascular: No carotid bruit.  Cardiovascular:     Rate and Rhythm: Normal rate and regular rhythm.     Pulses: Normal pulses.     Heart sounds: Normal heart sounds. No murmur heard.    No friction rub. No gallop.  Pulmonary:     Effort: Pulmonary effort is normal. No respiratory distress.     Breath sounds: Normal breath sounds. No wheezing, rhonchi or rales.  Abdominal:     General: Abdomen is flat. Bowel sounds are normal. There is no distension.     Palpations: Abdomen is soft.     Tenderness: There is no abdominal tenderness.  Musculoskeletal:     Cervical back: Normal range of motion. No tenderness.     Right lower leg: No edema.     Left lower leg: No edema.     Comments: No clubbing or cyanosis  Lymphadenopathy:     Cervical: No cervical adenopathy.  Skin:    General: Skin is warm and dry.     Findings: No rash.  Neurological:     General: No focal deficit present.     Mental Status: She is alert and oriented to person, place, and time.     Comments: CN II-XII grossly intact  Psychiatric:        Mood and Affect: Mood normal.        Behavior: Behavior normal.      Assessment:      Essential hypertension  History of breast cancer  Mixed hyperlipidemia  Primary osteoarthritis involving multiple joints  Age-related osteoporosis without current pathological fracture  Coronary artery disease involving native coronary artery of native heart without angina pectoris  Aortic atherosclerosis (HCC)  Stenosis of carotid artery, unspecified laterality  PMR (polymyalgia rheumatica) (HCC)  Vestibular disequilibrium involving both inner ears  Chronic cough  Insomnia, unspecified type  BMI 23.0-23.9, adult  Mixed rhinitis  Gastroesophageal reflux disease, unspecified whether esophagitis  present    Plan:     During the course of the visit the patient was educated and  counseled about appropriate screening and preventive services including:   Pneumococcal vaccine  Influenza vaccine Screening mammography Bone densitometry screening Colorectal cancer screening Advanced directives: discussed  Diet review for nutrition referral? Yes ____  Not Indicated _X___   Patient Instructions (the written plan) was given to the patient.  Aortic atherosclerosis (HCC) She has CAD, non-obstructive CAD, and carotid stenosis.  Continue risk factor modification.  Her goal LDL <70 and continue with ASA.  Coronary artery disease involving native coronary artery of native heart without angina pectoris She has non-obstructive CAD where she is followed by cardiology.  Continue with risk factor modification.  She denies any anginal complaints today.  Essential hypertension Her BP is doing well on her current med.  We will continue with amlodipine and lasix.  Stenosis of carotid artery She had a recent CTA of her head and neck in 04/2022.  She is followed by cardiology.  Mixed rhinitis Continue on cetrizine and flonase as needed.  Gastroesophageal reflux disease Continue on her pepcid.  She is going for EGD next week.  Vestibular disequilibrium This is stable without changes.  She uses meclizine as needed.  She can followup with Mr. Vincente Liberty.  Age-related osteoporosis without current pathological fracture Her t-score has improved over the years.  She has been on prolia since 2011.  She gets these injections by her rheumatologist.  Continue weight bearing exercises and calcium and Vit D.  Osteoarthritis She can use alleve as needed or tylenol as needed.  BMI 23.0-23.9, adult She is doing excellent with activity and exercises.  History of breast cancer She remains cancer free and sees Hematology/Oncology in 04/2023.  Hyperlipidemia Her LDL goal is <70.  She has been on repatha and  praulent in the past.  Continue on leqvio.  Insomnia Continue on ativan at this time.  PMR (polymyalgia rheumatica) (HCC) She is followed by Dr. Dierdre Forth.  She is not on anything at this time for her PMR.   Prevention Health maintenance discussed.  She needs a mammogram later in the year.  She will get yearly labs today.    Medicare Attestation I have personally reviewed: The patient's medical and social history Their use of alcohol, tobacco or illicit drugs Their current medications and supplements The patient's functional ability including ADLs,fall risks, home safety risks, cognitive, and hearing and visual impairment Diet and physical activities Evidence for depression or mood disorders  The patient's weight, height, and BMI have been recorded in the chart.  I have made referrals, counseling, and provided education to the patient based on review of the above and I have provided the patient with a written personalized care plan for preventive services.     Crist Fat, MD   03/09/2023

## 2023-03-09 NOTE — Assessment & Plan Note (Signed)
Continue on her pepcid.  She is going for EGD next week.

## 2023-03-09 NOTE — Assessment & Plan Note (Signed)
She is followed by Dr. Dierdre Forth.  She is not on anything at this time for her PMR.

## 2023-03-09 NOTE — Assessment & Plan Note (Signed)
Her BP is doing well on her current med.  We will continue with amlodipine and lasix.

## 2023-03-12 ENCOUNTER — Encounter: Payer: Self-pay | Admitting: Certified Registered Nurse Anesthetist

## 2023-03-13 ENCOUNTER — Encounter: Payer: Self-pay | Admitting: Gastroenterology

## 2023-03-13 ENCOUNTER — Ambulatory Visit (AMBULATORY_SURGERY_CENTER): Payer: Medicare Other | Admitting: Gastroenterology

## 2023-03-13 VITALS — BP 107/55 | HR 68 | Temp 97.5°F | Resp 15 | Ht <= 58 in | Wt 96.5 lb

## 2023-03-13 DIAGNOSIS — Z800 Family history of malignant neoplasm of digestive organs: Secondary | ICD-10-CM

## 2023-03-13 DIAGNOSIS — K219 Gastro-esophageal reflux disease without esophagitis: Secondary | ICD-10-CM

## 2023-03-13 DIAGNOSIS — K2970 Gastritis, unspecified, without bleeding: Secondary | ICD-10-CM | POA: Diagnosis not present

## 2023-03-13 DIAGNOSIS — R079 Chest pain, unspecified: Secondary | ICD-10-CM

## 2023-03-13 DIAGNOSIS — K222 Esophageal obstruction: Secondary | ICD-10-CM

## 2023-03-13 DIAGNOSIS — Z860101 Personal history of adenomatous and serrated colon polyps: Secondary | ICD-10-CM | POA: Diagnosis not present

## 2023-03-13 DIAGNOSIS — K5730 Diverticulosis of large intestine without perforation or abscess without bleeding: Secondary | ICD-10-CM | POA: Diagnosis not present

## 2023-03-13 DIAGNOSIS — Z1211 Encounter for screening for malignant neoplasm of colon: Secondary | ICD-10-CM

## 2023-03-13 DIAGNOSIS — E785 Hyperlipidemia, unspecified: Secondary | ICD-10-CM | POA: Diagnosis not present

## 2023-03-13 DIAGNOSIS — F419 Anxiety disorder, unspecified: Secondary | ICD-10-CM | POA: Diagnosis not present

## 2023-03-13 DIAGNOSIS — K2950 Unspecified chronic gastritis without bleeding: Secondary | ICD-10-CM | POA: Diagnosis not present

## 2023-03-13 DIAGNOSIS — I10 Essential (primary) hypertension: Secondary | ICD-10-CM | POA: Diagnosis not present

## 2023-03-13 MED ORDER — SODIUM CHLORIDE 0.9 % IV SOLN
500.0000 mL | INTRAVENOUS | Status: DC
Start: 2023-03-13 — End: 2023-03-13

## 2023-03-13 MED ORDER — OMEPRAZOLE 20 MG PO CPDR: 20.0000 mg | DELAYED_RELEASE_CAPSULE | Freq: Two times a day (BID) | ORAL | 3 refills | Status: DC

## 2023-03-13 NOTE — Patient Instructions (Signed)
Please read handouts provided. Continue present medications. Await pathology results. Return to GI office as needed. Soft diet today, advance to regular diet tomorrow as tolerated. Start Prilosec ( omeprazole ) 20 mg twice daily for 4 weeks, then reduce to 20 mg daily, and can discontinue if no further need.   YOU HAD AN ENDOSCOPIC PROCEDURE TODAY AT THE [facility]:   Refer to the procedure report that was given to you for any specific questions about what was found during the examination.  If the procedure report does not answer your questions, please call your gastroenterologist to clarify.  If you requested that your care partner not be given the details of your procedure findings, then the procedure report has been included in a sealed envelope for you to review at your convenience later.  YOU SHOULD EXPECT: Some feelings of bloating in the abdomen. Passage of more gas than usual.  Walking can help get rid of the air that was put into your GI tract during the procedure and reduce the bloating. If you had a lower endoscopy (such as a colonoscopy or flexible sigmoidoscopy) you may notice spotting of blood in your stool or on the toilet paper. If you underwent a bowel prep for your procedure, you may not have a normal bowel movement for a few days.  Please Note:  You might notice some irritation and congestion in your nose or some drainage.  This is from the oxygen used during your procedure.  There is no need for concern and it should clear up in a day or so.  SYMPTOMS TO REPORT IMMEDIATELY:  Following lower endoscopy (colonoscopy or flexible sigmoidoscopy):  Excessive amounts of blood in the stool  Significant tenderness or worsening of abdominal pains  Swelling of the abdomen that is new, acute  Fever of 100F or higher  Following upper endoscopy (EGD)  Vomiting of blood or coffee ground material  New chest pain or pain under the shoulder blades  Painful or persistently  difficult swallowing  New shortness of breath  Fever of 100F or higher  Black, tarry-looking stools  For urgent or emergent issues, a gastroenterologist can be reached at any hour by calling [phone]. Do not use MyChart messaging for urgent concerns.    DIET:  We do recommend a small meal at first, but then you may proceed to your regular diet.  Drink plenty of fluids but you should avoid alcoholic beverages for 24 hours.  ACTIVITY:  You should plan to take it easy for the rest of today and you should NOT DRIVE or use heavy machinery until tomorrow (because of the sedation medicines used during the test).    FOLLOW UP: Our staff will call the number listed on your records the next business day following your procedure.  We will call around 7:15- 8:00 am to check on you and address any questions or concerns that you may have regarding the information given to you following your procedure. If we do not reach you, we will leave a message.     If any biopsies were taken you will be contacted by phone or by letter within the next 1-3 weeks.  Please call us at [phone] if you have not heard about the biopsies in 3 weeks.    SIGNATURES/CONFIDENTIALITY: You and/or your care partner have signed paperwork which will be entered into your electronic medical record.  These signatures attest to the fact that that the information above on your After Visit Summary  has been reviewed and is understood.  Full responsibility of the confidentiality of this discharge information lies with you and/or your care-partner.

## 2023-03-13 NOTE — Progress Notes (Signed)
 Pt's states no medical or surgical changes since previsit or office visit.

## 2023-03-13 NOTE — Progress Notes (Signed)
 Report given to PACU, vss

## 2023-03-13 NOTE — Progress Notes (Signed)
0802 Robinul 0.1 mg IV given due large amount of secretions upon assessment.  MD made aware, vss 

## 2023-03-13 NOTE — Progress Notes (Signed)
GASTROENTEROLOGY PROCEDURE H&P NOTE   Primary Care Physician: Crist Fat, MD    Reason for Procedure:   GERD, chest pain, Fhx colon cancer, history of polyps  Plan:    EGD, colonoscopy  Patient is appropriate for endoscopic procedure(s) in the ambulatory ([facility]) setting.  The nature of the procedure, as well as the risks, benefits, and alternatives were carefully and thoroughly reviewed with the patient. Ample time for discussion and questions allowed. The patient understood, was satisfied, and agreed to proceed.     HPI: Amanda Cannon is a 78 y.o. female who presents for EGD and colonoscopy for evaluation of GERD, abnormal esophagram, chest pain (has been cleared by cardiology to proceed), along with Fhx of colon cancer and personal hx of polyps.   Past Medical History:  Diagnosis Date   Alcohol use    Anemia    Anxiety    Asthma    Basal cell carcinoma    forehead   Breast cancer (HCC) 1998   left    Colon polyps    GERD (gastroesophageal reflux disease)    Glaucoma    Hyperlipidemia    Hypertension    Hyponatremia    remote, mild   Mitral valve prolapse    Osteoarthritis    sees rheumatologist   Osteoporosis    sees rheumatologist   Personal history of radiation therapy 1998   PMR (polymyalgia rheumatica) (HCC)    sees rheumatologist   Pneumonia    Prinzmetal angina (HCC)    White matter changes    ? ischemic sm vs per pt    Past Surgical History:  Procedure Laterality Date   ABDOMINAL HYSTERECTOMY     BREAST LUMPECTOMY Left 1998   cancer   CESAREAN SECTION     x 4   COLONOSCOPY  07/2012   in [facility]   HERNIA REPAIR  2005   abdominal hernia repair   HYSTEROTOMY     LYMPH NODE DISSECTION Left    POLYPECTOMY      Prior to Admission medications   Medication Sig Start Date End Date Taking? Authorizing Provider  amLODipine (NORVASC) 5 MG tablet Take 1 tablet (5 mg total) by mouth daily. 06/14/22  Darlen Round, MD  aspirin 81 MG tablet Take  81 mg by mouth daily.   Yes [provider]  cetirizine (ZYRTEC) 10 MG tablet Take 1 tablet (10 mg total) by mouth 2 (two) times daily. 02/18/21  Yes Alfonse Spruce, MD  Cholecalciferol 25 MCG (1000 UT) tablet Take by mouth.   Yes [provider]  dorzolamide (TRUSOPT) 2 % ophthalmic solution Apply to eye. 11/14/22  Yes [provider]  famotidine (PEPCID) 40 MG tablet TAKE 1 TABLET BY MOUTH EVERYDAY AT BEDTIME 01/23/23  Yes Alfonse Spruce, MD  furosemide (LASIX) 20 MG tablet Take 20 mg by mouth daily.   Yes [provider]  latanoprost (XALATAN) 0.005 % ophthalmic solution 1 drop at bedtime. 12/09/21  Yes [provider]  LORazepam (ATIVAN) 1 MG tablet Take 1 mg by mouth at bedtime as needed (spasms).   Yes [provider]  meclizine (ANTIVERT) 25 MG tablet Take 1 tablet (25 mg total) by mouth 2 (two) times daily as needed for dizziness. 06/14/22  Yes Eloisa Northern, MD  naproxen sodium (ALEVE) 220 MG tablet 1 tablet as needed   Yes [provider]  denosumab (PROLIA) 60 MG/ML SOLN injection Inject 60 mg into the skin every 6 (six) months.  Administer in upper arm, thigh, or abdomen    [provider]  fluticasone (FLONASE) 50 MCG/ACT nasal spray SPRAY 2 SPRAYS INTO EACH NOSTRIL EVERY DAY 03/09/23   Alfonse Spruce, MD  inclisiran [facility]) 284 MG/1.5ML SOSY injection Inject 284 mg into the skin once.    [provider]  magnesium oxide (MAG-OX) 400 MG tablet Take by mouth as needed.    [provider]    Current Outpatient Medications  Medication Sig Dispense Refill   amLODipine (NORVASC) 5 MG tablet Take 1 tablet (5 mg total) by mouth daily. 90 tablet 3   aspirin 81 MG tablet Take 81 mg by mouth daily.     cetirizine (ZYRTEC) 10 MG tablet Take 1 tablet (10 mg total) by mouth 2 (two) times daily. 60 tablet 5   Cholecalciferol 25 MCG (1000 UT) tablet Take by mouth.     dorzolamide (TRUSOPT) 2 %  ophthalmic solution Apply to eye.     famotidine (PEPCID) 40 MG tablet TAKE 1 TABLET BY MOUTH EVERYDAY AT BEDTIME 90 tablet 0   furosemide (LASIX) 20 MG tablet Take 20 mg by mouth daily.     latanoprost (XALATAN) 0.005 % ophthalmic solution 1 drop at bedtime.     LORazepam (ATIVAN) 1 MG tablet Take 1 mg by mouth at bedtime as needed (spasms).     meclizine (ANTIVERT) 25 MG tablet Take 1 tablet (25 mg total) by mouth 2 (two) times daily as needed for dizziness. 60 tablet 2   naproxen sodium (ALEVE) 220 MG tablet 1 tablet as needed     denosumab (PROLIA) 60 MG/ML SOLN injection Inject 60 mg into the skin every 6 (six) months. Administer in upper arm, thigh, or abdomen     fluticasone (FLONASE) 50 MCG/ACT nasal spray SPRAY 2 SPRAYS INTO EACH NOSTRIL EVERY DAY 48 mL 0   inclisiran (LEQVIO) 284 MG/1.5ML SOSY injection Inject 284 mg into the skin once.     magnesium oxide (MAG-OX) 400 MG tablet Take by mouth as needed.     Current Facility-Administered Medications  Medication Dose Route Frequency Provider Last Rate Last Admin   0.9 %  sodium chloride infusion  500 mL Intravenous Continuous Kateryna Grantham V, DO        Allergies as of 03/13/2023 - Review Complete 03/13/2023  Allergen Reaction Noted   Acetaminophen Cough 07/15/2015   Beta adrenergic blockers  12/15/2020   Tetrahydrozoline-zn sulfate  09/21/2022   Erythromycin  05/06/2016   Erythromycin base Other (See Comments)    Other Other (See Comments) 05/06/2016   Sulfa antibiotics Other (See Comments) 05/06/2016   Turmeric  04/10/2017   Betamethasone Other (See Comments) 11/09/2021    Family History  Problem Relation Age of Onset   Coronary artery disease Mother 3   Coronary artery disease Father 36   Cancer - Ovarian Sister    Colon cancer Cousin        x 2   Esophageal cancer Neg Hx    Rectal cancer Neg Hx    Stomach cancer Neg Hx     Social History   Socioeconomic History   Marital status: Married    Spouse name:  Freida Busman    Number of children: 3   Years of education: 13   Highest education level: Associate degree: occupational, Scientist, product/process development, or vocational program  Occupational History   Occupation: Agricultural consultant  Tobacco Use   Smoking status: Former    Current packs/day: 0.00    Average packs/day: 1 pack/day  for 7.0 years (7.0 ttl pk-yrs)    Types: Cigarettes    Start date: 01/25/1964    Quit date: 01/25/1971    Years since quitting: 52.1   Smokeless tobacco: Never  Vaping Use   Vaping status: Never Used  Substance and Sexual Activity   Alcohol use: Yes    Alcohol/week: 14.0 standard drinks of alcohol    Types: 14 Standard drinks or equivalent per week    Comment: two drinks with burbon + water daily    Drug use: No   Sexual activity: Yes  Other Topics Concern   Not on file  Social History Narrative   Work or School: retired Teacher, adult education and retired Sales executive Situation: lives with husband      Spiritual Beliefs: Jewish      Lifestyle: active, diet is good      3 children + grandchildren   1 cat    Social Drivers of Corporate investment banker Strain: Low Risk  (05/31/2021)   Overall Financial Resource Strain (CARDIA)    Difficulty of Paying Living Expenses: Not hard at all  Food Insecurity: No Food Insecurity (05/31/2021)   Hunger Vital Sign    Worried About Running Out of Food in the Last Year: Never true    Ran Out of Food in the Last Year: Never true  Transportation Needs: No Transportation Needs (05/31/2021)   PRAPARE - Administrator, Civil Service (Medical): No    Lack of Transportation (Non-Medical): No  Physical Activity: Sufficiently Active (05/31/2021)   Exercise Vital Sign    Days of Exercise per Week: 5 days    Minutes of Exercise per Session: 40 min  Stress: No Stress Concern Present (05/31/2021)   [employer] of Occupational Health - Occupational Stress Questionnaire    Feeling of Stress : Not at all  Social Connections: Unknown  (06/07/2021)   Received from [facility], [facility]   Social Network    Social Network: Not on file  Intimate Partner Violence: Unknown (04/29/2021)   Received from [facility], [facility]   HITS    Physically Hurt: Not on file    Insult or Talk Down To: Not on file    Threaten Physical Harm: Not on file    Scream or Curse: Not on file    Physical Exam: Vital signs in last 24 hours: @BP  130/76   Pulse 76   Temp (!) 97.5 F (36.4 C)   Ht 4\' 10"  (1.473 m)   Wt 96 lb 8 oz (43.8 kg)   SpO2 98%   BMI 20.17 kg/m  GEN: NAD EYE: Sclerae anicteric ENT: MMM CV: Non-tachycardic Pulm: CTA b/l GI: Soft, NT/ND NEURO:  Alert & Oriented x 3   Doristine Locks, DO [facility]   03/13/2023 8:02 AM

## 2023-03-13 NOTE — Op Note (Signed)
[facility] Patient Name: Amanda Cannon Procedure Date: 03/13/2023 8:04 AM MRN: [id number] Endoscopist: Doristine Locks , MD, [id number] Age: 78 Referring MD:  Date of Birth: 11/07/1945 Gender: Female Account #: [id number] Procedure:                Upper GI endoscopy Indications:              Dysphagia, Suspected esophageal reflux, Abnormal                            cine-esophagram, Chest pain (non cardiac) Medicines:                Monitored Anesthesia Care Procedure:                Pre-Anesthesia Assessment:                           - Prior to the procedure, a History and Physical                            was performed, and patient medications and                            allergies were reviewed. The patient's tolerance of                            previous anesthesia was also reviewed. The risks                            and benefits of the procedure and the sedation                            options and risks were discussed with the patient.                            All questions were answered, and informed consent                            was obtained. Prior Anticoagulants: The patient has                            taken no anticoagulant or antiplatelet agents. ASA                            Grade Assessment: II - A patient with mild systemic                            disease. After reviewing the risks and benefits,                            the patient was deemed in satisfactory condition to                            undergo the procedure.  After obtaining informed consent, the endoscope was                            passed under direct vision. Throughout the                            procedure, the patient's blood pressure, pulse, and                            oxygen saturations were monitored continuously. The                            GIF HQ190 #[id number] was introduced through the                            mouth, and  advanced to the second part of duodenum.                            The upper GI endoscopy was accomplished without                            difficulty. The patient tolerated the procedure                            well. Scope In: Scope Out: Findings:                 The upper third of the esophagus and middle third                            of the esophagus were normal.                           One benign-appearing, intrinsic mild stenosis was                            found 34 cm from the incisors. This stenosis                            measured less than one cm (in length). The stenosis                            was traversed. A TTS dilator was passed through the                            scope. Dilation with a 15-16.5-18 mm balloon                            dilator was performed to 18 mm. The dilation site                            was examined and showed mild mucosal disruption and  moderate improvement in luminal narrowing.                            Estimated blood loss was minimal.                           The Z-line was regular and was found 35 cm from the                            incisors.                           Mild inflammation characterized by congestion                            (edema) and erythema was found in the gastric                            fundus, in the gastric body and in the gastric                            antrum. Biopsies were taken with a cold forceps for                            Helicobacter pylori testing. Estimated blood loss                            was minimal.                           The examined duodenum was normal. Complications:            No immediate complications. Estimated Blood Loss:     Estimated blood loss was minimal. Impression:               - Normal upper third of esophagus and middle third                            of esophagus.                           - Benign-appearing  esophageal stenosis. Dilated                            with 18 mm TTS balloon with appropriate mucosal                            disruption x2.                           - Z-line regular, 35 cm from the incisors.                           - Gastritis. Biopsied.                           - Normal  examined duodenum. Recommendation:           - Patient has a contact number available for                            emergencies. The signs and symptoms of potential                            delayed complications were discussed with the                            patient. Return to normal activities tomorrow.                            Written discharge instructions were provided to the                            patient.                           - Soft diet today, then advance as tolerated                            tomorrow per post dilation protocol.                           - Continue present medications.                           - Await pathology results.                           - Repeat upper endoscopy PRN.                           - Start Prilosec (omeprazole) 20 mg PO BID for 4                            weeks to promote mucosal healing, then reduce to 20                            mg daily, and if no further need, can discontinue.                           - Return to GI clinic PRN. Doristine Locks, MD 03/13/2023 8:48:02 AM

## 2023-03-13 NOTE — Progress Notes (Signed)
 Called to room to assist during endoscopic procedure.  Patient ID and intended procedure confirmed with present staff. Received instructions for my participation in the procedure from the performing physician.

## 2023-03-13 NOTE — Op Note (Signed)
[facility] Patient Name: Amanda Cannon Procedure Date: 03/13/2023 7:50 AM MRN: [id number] Endoscopist: Doristine Locks , MD, [id number] Age: 78 Referring MD:  Date of Birth: 24-Apr-1945 Gender: Female Account #: [id number] Procedure:                Colonoscopy Indications:              High risk colon cancer surveillance: Personal                            history of colonic polyps                           Last colonoscopy was in 09/2017 and notable for left                            sided diverticulosis. History of polyps prior to                            that. Medicines:                Monitored Anesthesia Care Procedure:                Pre-Anesthesia Assessment:                           - Prior to the procedure, a History and Physical                            was performed, and patient medications and                            allergies were reviewed. The patient's tolerance of                            previous anesthesia was also reviewed. The risks                            and benefits of the procedure and the sedation                            options and risks were discussed with the patient.                            All questions were answered, and informed consent                            was obtained. Prior Anticoagulants: The patient has                            taken no anticoagulant or antiplatelet agents. ASA                            Grade Assessment: II - A patient with mild systemic  disease. After reviewing the risks and benefits,                            the patient was deemed in satisfactory condition to                            undergo the procedure.                           After obtaining informed consent, the colonoscope                            was passed under direct vision. Throughout the                            procedure, the patient's blood pressure, pulse, and                             oxygen saturations were monitored continuously. The                            [id number]-[id number] Colonoscope [id number] was introduced                            through the anus and advanced to the the cecum,                            identified by appendiceal orifice and ileocecal                            valve. The colonoscopy was performed without                            difficulty. The patient tolerated the procedure                            well. The quality of the bowel preparation was                            good. The ileocecal valve, appendiceal orifice, and                            rectum were photographed. Scope In: 8:29:53 AM Scope Out: 8:40:45 AM Scope Withdrawal Time: 0 hours 7 minutes 53 seconds  Total Procedure Duration: 0 hours 10 minutes 52 seconds  Findings:                 The perianal and digital rectal examinations were                            normal.                           Multiple medium-mouthed and small-mouthed  diverticula were found in the sigmoid colon and                            ascending colon.                           The exam was otherwise normal throughout the                            remainder of the colon.                           The retroflexed view of the distal rectum and anal                            verge was normal and showed no anal or rectal                            abnormalities. Complications:            No immediate complications. Estimated Blood Loss:     Estimated blood loss: none. Impression:               - Diverticulosis in the sigmoid colon and in the                            ascending colon.                           - The distal rectum and anal verge are normal on                            retroflexion view.                           - No specimens collected. Recommendation:           - Patient has a contact number available for                            emergencies. The signs and  symptoms of potential                            delayed complications were discussed with the                            patient. Return to normal activities tomorrow.                            Written discharge instructions were provided to the                            patient.                           - Repeat colonoscopy is not recommended due to  current age (72 years or older) for screening                            purposes.                           - Return to GI office PRN. Doristine Locks, MD 03/13/2023 8:52:28 AM

## 2023-03-14 ENCOUNTER — Telehealth: Payer: Self-pay | Admitting: *Deleted

## 2023-03-14 NOTE — Telephone Encounter (Signed)
 Left message on f/u call

## 2023-03-16 ENCOUNTER — Encounter: Payer: Self-pay | Admitting: Gastroenterology

## 2023-03-16 LAB — SURGICAL PATHOLOGY

## 2023-03-21 DIAGNOSIS — I10 Essential (primary) hypertension: Secondary | ICD-10-CM | POA: Diagnosis not present

## 2023-03-21 DIAGNOSIS — I700 Atherosclerosis of aorta: Secondary | ICD-10-CM | POA: Diagnosis not present

## 2023-03-21 DIAGNOSIS — I2510 Atherosclerotic heart disease of native coronary artery without angina pectoris: Secondary | ICD-10-CM | POA: Diagnosis not present

## 2023-03-22 LAB — CMP14 + ANION GAP
ALT: 25 [IU]/L (ref 0–32)
AST: 39 [IU]/L (ref 0–40)
Albumin: 5 g/dL — ABNORMAL HIGH (ref 3.8–4.8)
Alkaline Phosphatase: 75 [IU]/L (ref 44–121)
Anion Gap: 18 mmol/L (ref 10.0–18.0)
BUN/Creatinine Ratio: 14 (ref 12–28)
BUN: 14 mg/dL (ref 8–27)
Bilirubin Total: 0.5 mg/dL (ref 0.0–1.2)
CO2: 23 mmol/L (ref 20–29)
Calcium: 10.3 mg/dL (ref 8.7–10.3)
Chloride: 95 mmol/L — ABNORMAL LOW (ref 96–106)
Creatinine, Ser: 0.99 mg/dL (ref 0.57–1.00)
Globulin, Total: 2.6 g/dL (ref 1.5–4.5)
Glucose: 91 mg/dL (ref 70–99)
Potassium: 5.1 mmol/L (ref 3.5–5.2)
Sodium: 136 mmol/L (ref 134–144)
Total Protein: 7.6 g/dL (ref 6.0–8.5)
eGFR: 58 mL/min/{1.73_m2} — ABNORMAL LOW (ref >59)

## 2023-03-22 LAB — LIPID PANEL
Chol/HDL Ratio: 2.3 {ratio} (ref 0.0–4.4)
Cholesterol, Total: 212 mg/dL — ABNORMAL HIGH (ref 100–199)
HDL: 94 mg/dL (ref >39)
LDL Chol Calc (NIH): 95 mg/dL (ref 0–99)
Triglycerides: 138 mg/dL (ref 0–149)
VLDL Cholesterol Cal: 23 mg/dL (ref 5–40)

## 2023-03-22 LAB — TSH: TSH: 6.28 u[IU]/mL — ABNORMAL HIGH (ref 0.450–4.500)

## 2023-03-22 LAB — CBC WITH DIFFERENTIAL/PLATELET
Basophils Absolute: 0.1 10*3/uL (ref 0.0–0.2)
Basos: 1 %
EOS (ABSOLUTE): 0.2 10*3/uL (ref 0.0–0.4)
Eos: 3 %
Hematocrit: 42.1 % (ref 34.0–46.6)
Hemoglobin: 14.4 g/dL (ref 11.1–15.9)
Immature Grans (Abs): 0 10*3/uL (ref 0.0–0.1)
Immature Granulocytes: 0 %
Lymphocytes Absolute: 1.5 10*3/uL (ref 0.7–3.1)
Lymphs: 25 %
MCH: 33.3 pg — ABNORMAL HIGH (ref 26.6–33.0)
MCHC: 34.2 g/dL (ref 31.5–35.7)
MCV: 97 fL (ref 79–97)
Monocytes Absolute: 0.8 10*3/uL (ref 0.1–0.9)
Monocytes: 13 %
Neutrophils Absolute: 3.6 10*3/uL (ref 1.4–7.0)
Neutrophils: 58 %
Platelets: 325 10*3/uL (ref 150–450)
RBC: 4.33 x10E6/uL (ref 3.77–5.28)
RDW: 11.9 % (ref 11.7–15.4)
WBC: 6.2 10*3/uL (ref 3.4–10.8)

## 2023-03-28 NOTE — Progress Notes (Signed)
 Pt has been notified.

## 2023-04-03 DIAGNOSIS — M353 Polymyalgia rheumatica: Secondary | ICD-10-CM | POA: Diagnosis not present

## 2023-04-03 DIAGNOSIS — M810 Age-related osteoporosis without current pathological fracture: Secondary | ICD-10-CM | POA: Diagnosis not present

## 2023-04-03 DIAGNOSIS — M1991 Primary osteoarthritis, unspecified site: Secondary | ICD-10-CM | POA: Diagnosis not present

## 2023-04-03 DIAGNOSIS — M5030 Other cervical disc degeneration, unspecified cervical region: Secondary | ICD-10-CM | POA: Diagnosis not present

## 2023-04-03 DIAGNOSIS — M5136 Other intervertebral disc degeneration, lumbar region with discogenic back pain only: Secondary | ICD-10-CM | POA: Diagnosis not present

## 2023-04-03 DIAGNOSIS — M25551 Pain in right hip: Secondary | ICD-10-CM | POA: Diagnosis not present

## 2023-04-03 DIAGNOSIS — R42 Dizziness and giddiness: Secondary | ICD-10-CM | POA: Diagnosis not present

## 2023-04-03 DIAGNOSIS — M654 Radial styloid tenosynovitis [de Quervain]: Secondary | ICD-10-CM | POA: Diagnosis not present

## 2023-04-03 DIAGNOSIS — Z6821 Body mass index (BMI) 21.0-21.9, adult: Secondary | ICD-10-CM | POA: Diagnosis not present

## 2023-04-07 ENCOUNTER — Other Ambulatory Visit: Payer: Self-pay | Admitting: Internal Medicine

## 2023-05-02 ENCOUNTER — Encounter: Payer: Self-pay | Admitting: Internal Medicine

## 2023-05-02 ENCOUNTER — Ambulatory Visit: Admitting: Internal Medicine

## 2023-05-02 VITALS — BP 120/70 | HR 96 | Temp 98.2°F | Resp 18 | Ht <= 58 in | Wt 96.5 lb

## 2023-05-02 DIAGNOSIS — J302 Other seasonal allergic rhinitis: Secondary | ICD-10-CM | POA: Diagnosis not present

## 2023-05-02 DIAGNOSIS — J069 Acute upper respiratory infection, unspecified: Secondary | ICD-10-CM | POA: Insufficient documentation

## 2023-05-02 DIAGNOSIS — F320 Major depressive disorder, single episode, mild: Secondary | ICD-10-CM | POA: Diagnosis not present

## 2023-05-02 HISTORY — DX: Other seasonal allergic rhinitis: J30.2

## 2023-05-02 MED ORDER — SALINE SPRAY 0.65 % NA SOLN: 1.0000 | NASAL | 0 refills | Status: AC | PRN

## 2023-05-02 MED ORDER — GUAIFENESIN ER 600 MG PO TB12: 1200.0000 mg | ORAL_TABLET | Freq: Two times a day (BID) | ORAL | 0 refills | Status: DC | PRN

## 2023-05-02 NOTE — Progress Notes (Signed)
   Acute Office Visit  Subjective:     Patient ID: Amanda Cannon, female    DOB: 1945/08/23, 78 y.o.   MRN: [id number]  Chief Complaint  Patient presents with   office visit    Allergy related symptoms wheezing coughing     HPI Patient is in today for cough wheezing and nasal congestion for 2 days. Her husband was sick now she started having same symptoms. She feel pressure in her face and forehead. No fever or chills. Her COVID test was negative.  She has asthma as a childhood.   She also has seasonal allergies and use zurtec and flonase.   Review of Systems  Constitutional: Negative.   HENT:  Positive for congestion.   Respiratory:  Positive for cough.   Cardiovascular: Negative.         Objective:    BP 120/70 (BP Location: Right Arm, Patient Position: Sitting, Cuff Size: Normal)   Pulse 96   Temp 98.2 F (36.8 C)   Resp 18   Ht 4\' 6"  (1.372 m)   Wt 96 lb 8 oz (43.8 kg)   SpO2 97%   BMI 23.27 kg/m    Physical Exam Constitutional:      Appearance: Normal appearance.  HENT:     Head: Normocephalic and atraumatic.     Left Ear: Tympanic membrane normal.  Cardiovascular:     Rate and Rhythm: Normal rate and regular rhythm.     Heart sounds: Normal heart sounds.  Pulmonary:     Effort: Pulmonary effort is normal.     Breath sounds: Normal breath sounds.  Neurological:     Mental Status: She is alert.     No results found for any visits on 05/02/23.      Assessment & Plan:   Problem List Items Addressed This Visit       Respiratory   Upper respiratory tract infection - Primary   She will use mucinex and saline mist few times a day. If she started wheezing or cough get worse then she will call.      Seasonal allergic rhinitis   She will continue to use fluticasone and zyrtec 10 mg daily       No orders of the defined types were placed in this encounter.   No follow-ups on file.  Eloisa Northern, MD

## 2023-05-02 NOTE — Assessment & Plan Note (Signed)
 She will continue to use fluticasone and zyrtec 10 mg daily

## 2023-05-02 NOTE — Assessment & Plan Note (Signed)
 She will use mucinex and saline mist few times a day. If she started wheezing or cough get worse then she will call.

## 2023-05-11 ENCOUNTER — Encounter: Payer: Self-pay | Admitting: Internal Medicine

## 2023-05-11 ENCOUNTER — Ambulatory Visit: Admitting: Internal Medicine

## 2023-05-11 VITALS — BP 100/60 | HR 92 | Temp 97.8°F | Resp 18 | Ht <= 58 in | Wt 98.4 lb

## 2023-05-11 DIAGNOSIS — J301 Allergic rhinitis due to pollen: Secondary | ICD-10-CM | POA: Diagnosis not present

## 2023-05-11 DIAGNOSIS — D72821 Monocytosis (symptomatic): Secondary | ICD-10-CM | POA: Diagnosis not present

## 2023-05-11 DIAGNOSIS — M353 Polymyalgia rheumatica: Secondary | ICD-10-CM | POA: Diagnosis not present

## 2023-05-11 DIAGNOSIS — Z9229 Personal history of other drug therapy: Secondary | ICD-10-CM | POA: Diagnosis not present

## 2023-05-11 DIAGNOSIS — C50512 Malignant neoplasm of lower-outer quadrant of left female breast: Secondary | ICD-10-CM | POA: Diagnosis not present

## 2023-05-11 DIAGNOSIS — Z170 Estrogen receptor positive status [ER+]: Secondary | ICD-10-CM | POA: Diagnosis not present

## 2023-05-11 DIAGNOSIS — R5382 Chronic fatigue, unspecified: Secondary | ICD-10-CM | POA: Diagnosis not present

## 2023-05-11 DIAGNOSIS — M810 Age-related osteoporosis without current pathological fracture: Secondary | ICD-10-CM | POA: Diagnosis not present

## 2023-05-11 DIAGNOSIS — R7989 Other specified abnormal findings of blood chemistry: Secondary | ICD-10-CM | POA: Diagnosis not present

## 2023-05-11 MED ORDER — MONTELUKAST SODIUM 10 MG PO TABS: 10.0000 mg | ORAL_TABLET | Freq: Every day | ORAL | 2 refills | Status: DC

## 2023-05-11 MED ORDER — MOMETASONE FUROATE 50 MCG/ACT NA SUSP: 1.0000 | Freq: Two times a day (BID) | NASAL | 2 refills | Status: DC

## 2023-05-11 NOTE — Progress Notes (Signed)
 Office Visit  Subjective   Patient ID: Amanda Cannon   DOB: Sep 28, 1945   Age: 78 y.o.   MRN: [id number]   Chief Complaint Chief Complaint  Patient presents with   office visit    Sinus infection since 3 weeks     History of Present Illness The patient is a 78 yo female who comes in today with continued respiratory symptoms which have been going on for 2-3 weeks.  She had sinus congestion with nasal discharge that was white with tingue of yellow.  She was having chest congestion with cough productive of same colored sputum as well as wheezing.  She did her only COVID-19 testing at that time and it was negative.  The patient was told to take zyrtec and mucinex and ayr nasal saline and flonase which she takes usually once per day for years.  She states that over the interim, her symptoms have improved but have not resolved.  She is having sinus congestion and nasal discharge of white/yellow sputum with post nasal drip and pressure behind her ears.  She is having chest congestion with cough productive of white/yellow sputum but no fevers, chills, SOB, headaches, myalgias, nausea, vomiting or diarrhea.  She is having some mild wheezing.       Past Medical History Past Medical History:  Diagnosis Date   Alcohol use    Anemia    Anxiety    Asthma    Basal cell carcinoma    forehead   Breast cancer (HCC) 1998   left    Colon polyps    GERD (gastroesophageal reflux disease)    Glaucoma    Hyperlipidemia    Hypertension    Hyponatremia    remote, mild   Mitral valve prolapse    Osteoarthritis    sees rheumatologist   Osteoporosis    sees rheumatologist   Personal history of radiation therapy 1998   PMR (polymyalgia rheumatica) (HCC)    sees rheumatologist   Pneumonia    Prinzmetal angina (HCC)    White matter changes    ? ischemic sm vs per pt     Allergies Allergies  Allergen Reactions   Acetaminophen Cough    Other Reaction(s): Unknown   Beta Adrenergic Blockers      Other Reaction(s): Other  Cognitive issues  Other Reaction(s): Mental Status Changes   Tetrahydrozoline-Zn Sulfate     Other Reaction(s): Mental Status Changes  Prescription eyedrops   Erythromycin     Stomach cramps  Other Reaction(s): Other (See Comments)  Stomach cramps, Stomach cramps   Erythromycin Base Other (See Comments)    Stomach issues   Other Other (See Comments)    Beta-blockers-patient states it makes her crazy   Sulfa Antibiotics Other (See Comments)    Doesn't recall   Turmeric     Lowers BP   Betamethasone Other (See Comments)    Other reaction(s): Not available     Medications  Current Outpatient Medications:    amLODipine (NORVASC) 5 MG tablet, Take 1 tablet (5 mg total) by mouth daily., Disp: 90 tablet, Rfl: 3   aspirin 81 MG tablet, Take 81 mg by mouth daily., Disp: , Rfl:    cetirizine (ZYRTEC) 10 MG tablet, Take 1 tablet (10 mg total) by mouth 2 (two) times daily., Disp: 60 tablet, Rfl: 5   Cholecalciferol 25 MCG (1000 UT) tablet, Take by mouth., Disp: , Rfl:    denosumab (PROLIA) 60 MG/ML SOLN injection, Inject 60 mg into the  skin every 6 (six) months. Administer in upper arm, thigh, or abdomen, Disp: , Rfl:    dorzolamide (TRUSOPT) 2 % ophthalmic solution, Apply to eye., Disp: , Rfl:    famotidine (PEPCID) 40 MG tablet, TAKE 1 TABLET BY MOUTH EVERYDAY AT BEDTIME, Disp: 90 tablet, Rfl: 0   furosemide (LASIX) 20 MG tablet, Take 20 mg by mouth daily., Disp: , Rfl:    guaiFENesin (MUCINEX) 600 MG 12 hr tablet, Take 2 tablets (1,200 mg total) by mouth 2 (two) times daily as needed., Disp: 60 tablet, Rfl: 0   inclisiran (LEQVIO) 284 MG/1.5ML SOSY injection, Inject 284 mg into the skin once., Disp: , Rfl:    latanoprost (XALATAN) 0.005 % ophthalmic solution, 1 drop at bedtime., Disp: , Rfl:    LORazepam (ATIVAN) 1 MG tablet, Take 1 mg by mouth at bedtime as needed (spasms)., Disp: , Rfl:    magnesium oxide (MAG-OX) 400 MG tablet, Take by mouth as  needed., Disp: , Rfl:    meclizine (ANTIVERT) 25 MG tablet, TAKE 1 TABLET (25 MG TOTAL) BY MOUTH 2 (TWO) TIMES DAILY AS NEEDED FOR DIZZINESS., Disp: 60 tablet, Rfl: 2   naproxen sodium (ALEVE) 220 MG tablet, 1 tablet as needed, Disp: , Rfl:    omeprazole (PRILOSEC) 20 MG capsule, Take 1 capsule (20 mg total) by mouth 2 (two) times daily. Prilosec 20 mg twice daily for 4 weeks, then reduce to once daily. Can discontinue if no further need., Disp: 60 capsule, Rfl: 3   sodium chloride (OCEAN) 0.65 % SOLN nasal spray, Place 1 spray into both nostrils as needed for congestion., Disp: 37.5 mL, Rfl: 0   Review of Systems Review of Systems  Constitutional:  Negative for chills and fever.  Respiratory:  Positive for cough, sputum production and wheezing. Negative for shortness of breath.   Gastrointestinal:  Negative for abdominal pain, constipation, diarrhea, nausea and vomiting.  Skin:  Negative for rash.  Neurological:  Positive for weakness. Negative for dizziness.       Objective:    Vitals BP 100/60 (BP Location: Left Arm, Patient Position: Sitting, Cuff Size: Normal)   Pulse 92   Temp 97.8 F (36.6 C)   Resp 18   Ht 4\' 6"  (1.372 m)   Wt 98 lb 6 oz (44.6 kg)   SpO2 97%   BMI 23.72 kg/m    Physical Examination Physical Exam Constitutional:      Appearance: Normal appearance. She is not ill-appearing.  HENT:     Right Ear: Tympanic membrane, ear canal and external ear normal.     Left Ear: Tympanic membrane, ear canal and external ear normal.     Nose: Nose normal. No congestion or rhinorrhea.     Mouth/Throat:     Mouth: Mucous membranes are moist.     Pharynx: Oropharynx is clear. No oropharyngeal exudate or posterior oropharyngeal erythema.  Cardiovascular:     Rate and Rhythm: Normal rate and regular rhythm.     Pulses: Normal pulses.     Heart sounds: No murmur heard.    No friction rub. No gallop.  Pulmonary:     Effort: Pulmonary effort is normal. No respiratory  distress.     Breath sounds: No wheezing, rhonchi or rales.  Abdominal:     General: Bowel sounds are normal. There is no distension.     Palpations: Abdomen is soft.     Tenderness: There is no abdominal tenderness.  Musculoskeletal:     Right lower leg:  No edema.     Left lower leg: No edema.  Skin:    General: Skin is warm and dry.     Findings: No rash.  Neurological:     Mental Status: She is alert.        Assessment & Plan:   Seasonal allergic rhinitis I want her to change her zyrtec to xyzal and change her flonase to nasonex.  We will give her a kenalog shot today.  She does not have infection.    No follow-ups on file.   Wayne Haines, MD

## 2023-05-11 NOTE — Assessment & Plan Note (Signed)
 I want her to change her zyrtec to xyzal and change her flonase to nasonex.  We will give her a kenalog shot today.  She does not have infection.

## 2023-05-12 DIAGNOSIS — E039 Hypothyroidism, unspecified: Secondary | ICD-10-CM | POA: Insufficient documentation

## 2023-05-12 HISTORY — DX: Hypothyroidism, unspecified: E03.9

## 2023-05-18 ENCOUNTER — Encounter (HOSPITAL_BASED_OUTPATIENT_CLINIC_OR_DEPARTMENT_OTHER): Payer: Self-pay

## 2023-05-18 ENCOUNTER — Ambulatory Visit (HOSPITAL_BASED_OUTPATIENT_CLINIC_OR_DEPARTMENT_OTHER): Admitting: Cardiovascular Disease

## 2023-05-18 ENCOUNTER — Ambulatory Visit (HOSPITAL_BASED_OUTPATIENT_CLINIC_OR_DEPARTMENT_OTHER): Payer: Medicare Other | Admitting: Cardiovascular Disease

## 2023-05-19 LAB — POC COVID19 BINAXNOW: SARS Coronavirus 2 Ag: NEGATIVE

## 2023-05-19 NOTE — Addendum Note (Signed)
 Addended by: Maury Space on: 05/19/2023 08:53 AM   Modules accepted: Orders

## 2023-05-23 DIAGNOSIS — E039 Hypothyroidism, unspecified: Secondary | ICD-10-CM | POA: Diagnosis not present

## 2023-05-24 DIAGNOSIS — M810 Age-related osteoporosis without current pathological fracture: Secondary | ICD-10-CM | POA: Diagnosis not present

## 2023-05-24 NOTE — Progress Notes (Signed)
 [facility] [address] [place] [address] Phone: [phone]   Assessment and Plan:     1. Neck pain 2. Strain of left trapezius muscle, initial encounter 3. DDD (degenerative disc disease), cervical -Chronic with exacerbation, subsequent visit - Recurrent flare of chronic neck pain consistent with flare of cervical DDD and pain radiating along left trapezius musculature - C-spine imaging from 2021 shows moderate diffuse and multilevel degenerative disc disease with facet hypertrophy, retrolisthesis C4 on C5 and anterolisthesis of C6 on C7 and C7 on T1. - Start HEP for neck and trapezius - Start meloxicam  15 mg daily x2 weeks.  If still having pain after 2 weeks, complete 3rd-week of NSAID. May use remaining NSAID as needed once daily for pain control.  Do not to use additional over-the-counter NSAIDs (ibuprofen, naproxen, Advil, Aleve, etc.) while taking prescription NSAIDs.  May use Tylenol [phone] mg 2 to 3 times a day for breakthrough pain.   15 additional minutes spent for educating Therapeutic Home Exercise Program.  This included exercises focusing on stretching, strengthening, with focus on eccentric aspects.   Long term goals include an improvement in range of motion, strength, endurance as well as avoiding reinjury. Patient's frequency would include in 1-2 times a day, 3-5 times a week for a duration of 6-12 weeks. Proper technique shown and discussed handout in great detail with ATC.  All questions were discussed and answered.     Pertinent previous records reviewed include C-spine x-ray and MRI from 2021  Follow Up: 4 weeks for reevaluation.  If no improvement or worsening of symptoms, could discuss epidural CSI versus physical therapy   Subjective:   I, Amanda Cannon, am serving as a Neurosurgeon for Doctor Amanda Cannon  Chief Complaint: neck pain   HPI:   05/25/2023 Patient is a 78 year old female with neck pain.  Patient states just when she turns her head to the left, moves it up and down, and when she sleeps. When she raises her left arm it sounds like there is cracking in the bones that is not normal. Doing yoga and other things but if she turns her head to fast it is bad. Some times just can't move it.    Relevant Historical Information: CAD, GERD, osteoporosis, history of PMR, history of breast cancer  Additional pertinent review of systems negative.   Current Outpatient Medications:    amLODipine  (NORVASC ) 5 MG tablet, Take 1 tablet (5 mg total) by mouth daily., Disp: 90 tablet, Rfl: 3   aspirin 81 MG tablet, Take 81 mg by mouth daily., Disp: , Rfl:    Cholecalciferol 25 MCG (1000 UT) tablet, Take by mouth., Disp: , Rfl:    denosumab  (PROLIA ) 60 MG/ML SOLN injection, Inject 60 mg into the skin every 6 (six) months. Administer in upper arm, thigh, or abdomen, Disp: , Rfl:    dorzolamide (TRUSOPT) 2 % ophthalmic solution, Apply to eye., Disp: , Rfl:    famotidine  (PEPCID ) 40 MG tablet, TAKE 1 TABLET BY MOUTH EVERYDAY AT BEDTIME, Disp: 90 tablet, Rfl: 0   furosemide  (LASIX ) 20 MG tablet, Take 20 mg by mouth daily., Disp: , Rfl:    inclisiran (LEQVIO ) 284 MG/1.5ML SOSY injection, Inject 284 mg into the skin once., Disp: , Rfl:    latanoprost (XALATAN) 0.005 % ophthalmic solution, 1 drop at bedtime., Disp: , Rfl:    LORazepam (ATIVAN) 1 MG tablet, Take 1 mg by mouth at bedtime as needed (  spasms)., Disp: , Rfl:    magnesium  oxide (MAG-OX) 400 MG tablet, Take by mouth as needed., Disp: , Rfl:    meclizine  (ANTIVERT ) 25 MG tablet, TAKE 1 TABLET (25 MG TOTAL) BY MOUTH 2 (TWO) TIMES DAILY AS NEEDED FOR DIZZINESS., Disp: 60 tablet, Rfl: 2   meloxicam  (MOBIC ) 15 MG tablet, Take 1 tablet (15 mg total) by mouth daily., Disp: 30 tablet, Rfl: 0   mometasone  (NASONEX ) 50 MCG/ACT nasal spray, Place 1 spray into the nose in the morning and at bedtime., Disp: 1 each, Rfl: 2   montelukast  (SINGULAIR ) 10 MG tablet,  Take 1 tablet (10 mg total) by mouth daily., Disp: 30 tablet, Rfl: 2   naproxen sodium (ALEVE) 220 MG tablet, 1 tablet as needed, Disp: , Rfl:    omeprazole  (PRILOSEC) 20 MG capsule, Take 1 capsule (20 mg total) by mouth 2 (two) times daily. Prilosec 20 mg twice daily for 4 weeks, then reduce to once daily. Can discontinue if no further need., Disp: 60 capsule, Rfl: 3   sodium chloride  (OCEAN) 0.65 % SOLN nasal spray, Place 1 spray into both nostrils as needed for congestion., Disp: 37.5 mL, Rfl: 0   cetirizine  (ZYRTEC ) 10 MG tablet, Take 1 tablet (10 mg total) by mouth 2 (two) times daily., Disp: 60 tablet, Rfl: 5   guaiFENesin  (MUCINEX ) 600 MG 12 hr tablet, Take 2 tablets (1,200 mg total) by mouth 2 (two) times daily as needed., Disp: 60 tablet, Rfl: 0   Objective:     Vitals:   05/25/23 0837  BP: 120/70  Pulse: 92  SpO2: 99%  Weight: 98 lb 3.2 oz (44.5 kg)  Height: 4\' 6"  (1.372 m)      Body mass index is 23.68 kg/m.    Physical Exam:    Neck Exam: Cervical Spine- Posture normal Skin- normal, intact  Neuro:  Strength-  Right Left   Deltoid (C5) 5/5 5/5  Bicep/Brachioradialis (C5/6) 5/5  5/5  Wrist Extension (C6) 5/5 5/5  Tricep (C7) 5/5 5/5  Wrist Flexion (C7) 5/5 5/5  Grip (C8) 5/5 5/5  Finger Abduction (T1) 5/5 5/5   Sensation: intact to light touch in upper extremities bilaterally  Spurling's:  negative bilaterally Neck ROM: Restricted sidebending bilaterally and left-sided rotation TTP: C5-7 cervical spinous processes, cervical paraspinal, left trapezius NTTP:  thoracic paraspinal, right trapezius    Electronically signed by:  Amanda [facility] 10:37 AM 05/25/23

## 2023-05-25 ENCOUNTER — Ambulatory Visit: Admitting: Sports Medicine

## 2023-05-25 VITALS — BP 120/70 | HR 92 | Ht <= 58 in | Wt 98.2 lb

## 2023-05-25 DIAGNOSIS — C50512 Malignant neoplasm of lower-outer quadrant of left female breast: Secondary | ICD-10-CM | POA: Diagnosis not present

## 2023-05-25 DIAGNOSIS — M5030 Other cervical disc degeneration, unspecified cervical region: Secondary | ICD-10-CM

## 2023-05-25 DIAGNOSIS — S46812A Strain of other muscles, fascia and tendons at shoulder and upper arm level, left arm, initial encounter: Secondary | ICD-10-CM

## 2023-05-25 DIAGNOSIS — M542 Cervicalgia: Secondary | ICD-10-CM

## 2023-05-25 DIAGNOSIS — Z170 Estrogen receptor positive status [ER+]: Secondary | ICD-10-CM | POA: Diagnosis not present

## 2023-05-25 MED ORDER — MELOXICAM 15 MG PO TABS: 15.0000 mg | ORAL_TABLET | Freq: Every day | ORAL | 0 refills | Status: DC

## 2023-05-25 NOTE — Patient Instructions (Addendum)
-   Start meloxicam  15 mg daily x2 weeks.  If still having pain after 2 weeks, complete 3rd-week of NSAID. May use remaining NSAID as needed once daily for pain control.  Do not to use additional over-the-counter NSAIDs (ibuprofen, naproxen, Advil, Aleve, etc.) while taking prescription NSAIDs.  May use Tylenol [phone] mg 2 to 3 times a day for breakthrough pain.  Neck HEP. Follw up in 4 weeks.

## 2023-05-29 DIAGNOSIS — H401134 Primary open-angle glaucoma, bilateral, indeterminate stage: Secondary | ICD-10-CM | POA: Diagnosis not present

## 2023-05-30 ENCOUNTER — Other Ambulatory Visit: Payer: Self-pay | Admitting: Internal Medicine

## 2023-05-30 ENCOUNTER — Other Ambulatory Visit: Payer: Self-pay | Admitting: Allergy & Immunology

## 2023-05-30 DIAGNOSIS — N644 Mastodynia: Secondary | ICD-10-CM | POA: Diagnosis not present

## 2023-05-30 DIAGNOSIS — Z170 Estrogen receptor positive status [ER+]: Secondary | ICD-10-CM | POA: Diagnosis not present

## 2023-05-30 DIAGNOSIS — R92323 Mammographic fibroglandular density, bilateral breasts: Secondary | ICD-10-CM | POA: Diagnosis not present

## 2023-05-30 DIAGNOSIS — C50512 Malignant neoplasm of lower-outer quadrant of left female breast: Secondary | ICD-10-CM | POA: Diagnosis not present

## 2023-05-30 DIAGNOSIS — Z853 Personal history of malignant neoplasm of breast: Secondary | ICD-10-CM | POA: Diagnosis not present

## 2023-06-08 ENCOUNTER — Ambulatory Visit: Payer: Medicare Other | Admitting: Internal Medicine

## 2023-06-08 ENCOUNTER — Encounter: Payer: Self-pay | Admitting: Internal Medicine

## 2023-06-08 VITALS — BP 110/70 | HR 80 | Temp 97.2°F | Resp 18 | Ht <= 58 in | Wt 99.0 lb

## 2023-06-08 DIAGNOSIS — E782 Mixed hyperlipidemia: Secondary | ICD-10-CM

## 2023-06-08 DIAGNOSIS — E039 Hypothyroidism, unspecified: Secondary | ICD-10-CM | POA: Diagnosis not present

## 2023-06-08 DIAGNOSIS — I10 Essential (primary) hypertension: Secondary | ICD-10-CM | POA: Diagnosis not present

## 2023-06-08 DIAGNOSIS — I2510 Atherosclerotic heart disease of native coronary artery without angina pectoris: Secondary | ICD-10-CM

## 2023-06-08 MED ORDER — EZETIMIBE 10 MG PO TABS: 10.0000 mg | ORAL_TABLET | Freq: Every day | ORAL | 3 refills | Status: AC

## 2023-06-08 NOTE — Assessment & Plan Note (Signed)
 She denies any problems with angina.  Her CAD is stable and she had a CT coronary angiogram in 12/2022.  We need to control her LDL better (see below).  She is on levqio and ASA 81mg  daily.

## 2023-06-08 NOTE — Progress Notes (Signed)
 Office Visit  Subjective   Patient ID: Amanda Cannon   DOB: 09-Oct-1945   Age: 78 y.o.   MRN: [id number]   Chief Complaint Chief Complaint  Patient presents with   Follow-up    3 month follow up      History of Present Illness The patient is a 78 year old female who presents for a follow-up evaluation of hypertension.  She was diagnosed with HTN in her late 87's.  The patient has not been checking her blood pressure at home.. The patient's current medications include: amlodipine  5mg  daily and lasix  20mg  daily. The patient has been tolerating her medications well. The patient denies any headache, visual changes, dchest pain, shortness of breath, weakness/numbness, and edema. She reports there have been no other symptoms noted.    The patient also returns today for routine followup on her cholesterol.  On her last visit, her LDL was 95 with goal <70.  She has statin intolerance in the past.  The patient does have a history of hyperlipidemia, Prinzmetal's Angina, and less than 39% bilateral carotid obstruction 2018.  She had a Bilateral carotid duplex 10/2021 which revealed 1-39% stenosis in the right and left ICA, Antegrade LVA and atypical antegrade RVA.Overall, she states she is doing well and is without any complaints or problems at this time.  She has been on statins in the past with lipitor causing upset stomach.  She has been put on praulent but this was changed to repatha  by her insurance.  She was on repatha  but was having problems with the shot causing pain by the delivery system.  Her insurance switched her to leqvio  284mg  subcut twice a year.  She specifically denies abdominal pain, nausea, vomiting, diarrhea, myalgias, and fatigue. She remains on dietary management as well as a regular exercise program and leqvio  284mg  twice a year. She is fasting in anticipation for labs today.    This patient also has a history of non-obstructive CAD and presents today for a status visit.  She had  chest pain while in the gym in 11/2022.  She saw her GI for her colonoscopy/EGD and she was referred to cardiology to be cleared for these procedures.  They performed a cardiac CTA on 01/05/23 calcium  score 274 placing her in 72nd percentil with TPV 24mm3. There was 0-24% stenosis of LAD and LCx. Clearance was granted for colonoscopy and endoscopy. They felt she had nonobstructive CAD / Prinzmetal Angina and recommended to continue her ASA and leqvio  with goal LDL <70.  She was also noted on cardiac CTA to have aortic atherosclerosis.  Her CAD is controlled with therapy as summarized in the medication list and previous notes. The patient has no comorbid conditions. She has the following baseline symptoms: exertional dyspnea, chest pain, and palpitations. She has the following modifiable risk factor(s): HTN, and hyperlipidemia. Specifically denied complaint(s): chest pain or SOB with exertion, palpitations, orthopnea, edema, exertional dyspnea, and syncope. Interval studies: none.  She last saw cardiology on 02/27/2023.  She had a Carotid duplex on 10/2021 bilateral 1-39% stenosis. CT angio neck 04/2022 left carotid artery with 20% stenosis in ICA bulb, mild to moderate stenosis of the proximal left subclavian artery, aortic atherosclerosis.  The patient does have a history of hypothyroidism.  She states her OB/GYN started levothyroxine 25mcg in 04/2022 after she saw her TSH was 6.2.  She is currently on levothyroxine 25mcg daily.  The patient complains still of fatigue.  There is no unexplained weight loss or  weight gain, tremors, anxiety, constipation, dry skin or other problems.     Past Medical History Past Medical History:  Diagnosis Date   Alcohol use    Anemia    Anxiety    Asthma    Basal cell carcinoma    forehead   Breast cancer (HCC) 1998   left    Colon polyps    GERD (gastroesophageal reflux disease)    Glaucoma    Hyperlipidemia    Hypertension    Hyponatremia    remote, mild    Mitral valve prolapse    Osteoarthritis    sees rheumatologist   Osteoporosis    sees rheumatologist   Personal history of radiation therapy 1998   PMR (polymyalgia rheumatica) (HCC)    sees rheumatologist   Pneumonia    Prinzmetal angina (HCC)    White matter changes    ? ischemic sm vs per pt     Allergies Allergies  Allergen Reactions   Acetaminophen Cough    Other Reaction(s): Unknown   Beta Adrenergic Blockers     Other Reaction(s): Other  Cognitive issues  Other Reaction(s): Mental Status Changes   Tetrahydrozoline-Zn Sulfate     Other Reaction(s): Mental Status Changes  Prescription eyedrops   Erythromycin     Stomach cramps  Other Reaction(s): Other (See Comments)  Stomach cramps, Stomach cramps   Erythromycin Base Other (See Comments)    Stomach issues   Other Other (See Comments)    Beta-blockers-patient states it makes her crazy   Sulfa Antibiotics Other (See Comments)    Doesn't recall   Turmeric     Lowers BP   Betamethasone Other (See Comments)    Other reaction(s): Not available     Medications  Current Outpatient Medications:    amLODipine  (NORVASC ) 5 MG tablet, TAKE 1 TABLET (5 MG TOTAL) BY MOUTH DAILY., Disp: 90 tablet, Rfl: 3   aspirin 81 MG tablet, Take 81 mg by mouth daily., Disp: , Rfl:    cetirizine  (ZYRTEC ) 10 MG tablet, Take 1 tablet (10 mg total) by mouth 2 (two) times daily., Disp: 60 tablet, Rfl: 5   Cholecalciferol 25 MCG (1000 UT) tablet, Take by mouth., Disp: , Rfl:    denosumab  (PROLIA ) 60 MG/ML SOLN injection, Inject 60 mg into the skin every 6 (six) months. Administer in upper arm, thigh, or abdomen, Disp: , Rfl:    dorzolamide (TRUSOPT) 2 % ophthalmic solution, Apply to eye., Disp: , Rfl:    famotidine  (PEPCID ) 40 MG tablet, TAKE 1 TABLET BY MOUTH EVERYDAY AT BEDTIME, Disp: 90 tablet, Rfl: 1   furosemide  (LASIX ) 20 MG tablet, Take 20 mg by mouth daily., Disp: , Rfl:    guaiFENesin  (MUCINEX ) 600 MG 12 hr tablet, Take 2  tablets (1,200 mg total) by mouth 2 (two) times daily as needed., Disp: 60 tablet, Rfl: 0   inclisiran (LEQVIO ) 284 MG/1.5ML SOSY injection, Inject 284 mg into the skin once., Disp: , Rfl:    latanoprost (XALATAN) 0.005 % ophthalmic solution, 1 drop at bedtime., Disp: , Rfl:    levothyroxine (SYNTHROID) 25 MCG tablet, Take 25 mcg by mouth daily., Disp: , Rfl:    LORazepam (ATIVAN) 1 MG tablet, Take 1 mg by mouth at bedtime as needed (spasms)., Disp: , Rfl:    magnesium  oxide (MAG-OX) 400 MG tablet, Take by mouth as needed., Disp: , Rfl:    meclizine  (ANTIVERT ) 25 MG tablet, TAKE 1 TABLET (25 MG TOTAL) BY MOUTH 2 (TWO) TIMES DAILY AS  NEEDED FOR DIZZINESS., Disp: 60 tablet, Rfl: 2   meloxicam  (MOBIC ) 15 MG tablet, Take 1 tablet (15 mg total) by mouth daily., Disp: 30 tablet, Rfl: 0   mometasone  (NASONEX ) 50 MCG/ACT nasal spray, Place 1 spray into the nose in the morning and at bedtime., Disp: 1 each, Rfl: 2   montelukast  (SINGULAIR ) 10 MG tablet, Take 1 tablet (10 mg total) by mouth daily., Disp: 30 tablet, Rfl: 2   naproxen sodium (ALEVE) 220 MG tablet, 1 tablet as needed, Disp: , Rfl:    omeprazole  (PRILOSEC) 20 MG capsule, Take 1 capsule (20 mg total) by mouth 2 (two) times daily. Prilosec 20 mg twice daily for 4 weeks, then reduce to once daily. Can discontinue if no further need., Disp: 60 capsule, Rfl: 3   sodium chloride  (OCEAN) 0.65 % SOLN nasal spray, Place 1 spray into both nostrils as needed for congestion., Disp: 37.5 mL, Rfl: 0   Review of Systems Review of Systems  Constitutional:  Positive for malaise/fatigue. Negative for chills and fever.  Eyes:  Negative for blurred vision and double vision.  Respiratory:  Negative for cough and shortness of breath.   Cardiovascular:  Negative for chest pain, palpitations and leg swelling.  Gastrointestinal:  Negative for abdominal pain, constipation, diarrhea, nausea and vomiting.  Genitourinary:  Negative for frequency.  Musculoskeletal:   Negative for myalgias.  Skin:  Negative for itching and rash.  Neurological:  Negative for dizziness, weakness and headaches.  Endo/Heme/Allergies:  Negative for polydipsia.       Objective:    Vitals BP 110/70   Pulse 80   Temp (!) 97.2 F (36.2 C)   Resp 18   Ht 4\' 6"  (1.372 m)   Wt 99 lb (44.9 kg)   SpO2 97%   BMI 23.87 kg/m    Physical Examination Physical Exam Constitutional:      Appearance: Normal appearance. She is not ill-appearing.  Neck:     Vascular: No carotid bruit.  Cardiovascular:     Rate and Rhythm: Normal rate and regular rhythm.     Pulses: Normal pulses.     Heart sounds: No murmur heard.    No friction rub. No gallop.  Pulmonary:     Effort: Pulmonary effort is normal. No respiratory distress.     Breath sounds: No wheezing, rhonchi or rales.  Abdominal:     General: Bowel sounds are normal. There is no distension.     Palpations: Abdomen is soft.     Tenderness: There is no abdominal tenderness.  Musculoskeletal:     Right lower leg: No edema.     Left lower leg: No edema.  Skin:    General: Skin is warm and dry.     Findings: No rash.  Neurological:     General: No focal deficit present.     Mental Status: She is alert and oriented to person, place, and time.  Psychiatric:        Mood and Affect: Mood normal.        Behavior: Behavior normal.        Assessment & Plan:   Coronary artery disease involving native coronary artery of native heart without angina pectoris She denies any problems with angina.  Her CAD is stable and she had a CT coronary angiogram in 12/2022.  We need to control her LDL better (see below).  She is on levqio and ASA 81mg  daily.  Essential hypertension Her BP is controlled.  We will continue  her current medications.  Hypothyroidism Her OB/GYN started her on levothyroxine.  We will check her Free T4 and TSH on her next visit.  Hyperlipidemia Her LDL is not controlled just on levquio on its own.  We will  start her on some zetia  and check her FLP on her next visit.    Return in about 3 months (around 09/08/2023).   Wayne Haines, MD

## 2023-06-08 NOTE — Assessment & Plan Note (Signed)
 Her OB/GYN started her on levothyroxine.  We will check her Free T4 and TSH on her next visit.

## 2023-06-08 NOTE — Assessment & Plan Note (Signed)
 Her LDL is not controlled just on levquio on its own.  We will start her on some zetia  and check her FLP on her next visit.

## 2023-06-08 NOTE — Assessment & Plan Note (Signed)
Her BP is controlled.  We will continue her current medications. 

## 2023-06-15 DIAGNOSIS — L821 Other seborrheic keratosis: Secondary | ICD-10-CM | POA: Diagnosis not present

## 2023-06-15 DIAGNOSIS — L814 Other melanin hyperpigmentation: Secondary | ICD-10-CM | POA: Diagnosis not present

## 2023-06-15 DIAGNOSIS — Z85828 Personal history of other malignant neoplasm of skin: Secondary | ICD-10-CM | POA: Diagnosis not present

## 2023-06-15 DIAGNOSIS — L570 Actinic keratosis: Secondary | ICD-10-CM | POA: Diagnosis not present

## 2023-06-21 NOTE — Progress Notes (Unsigned)
 Ben Jackson D.[name] [address] [place] [address] Phone: [phone]   Assessment and Plan:     There are no diagnoses linked to this encounter.  ***   Pertinent previous records reviewed include ***    Follow Up: ***     Subjective:   I, Arantza Darrington, am serving as a Neurosurgeon for Doctor Ulysees Gander   Chief Complaint: neck pain    HPI:    05/25/2023 Patient is a 78 year old female with neck pain. Patient states just when she turns her head to the left, moves it up and down, and when she sleeps. When she raises her left arm it sounds like there is cracking in the bones that is not normal. Doing yoga and other things but if she turns her head to fast it is bad. Some times just can't move it.    06/22/2023 Patient states   Relevant Historical Information: CAD, GERD, osteoporosis, history of PMR, history of breast cancer  Additional pertinent review of systems negative.   Current Outpatient Medications:    amLODipine  (NORVASC ) 5 MG tablet, TAKE 1 TABLET (5 MG TOTAL) BY MOUTH DAILY., Disp: 90 tablet, Rfl: 3   aspirin 81 MG tablet, Take 81 mg by mouth daily., Disp: , Rfl:    Cholecalciferol 25 MCG (1000 UT) tablet, Take by mouth., Disp: , Rfl:    denosumab  (PROLIA ) 60 MG/ML SOLN injection, Inject 60 mg into the skin every 6 (six) months. Administer in upper arm, thigh, or abdomen, Disp: , Rfl:    dorzolamide (TRUSOPT) 2 % ophthalmic solution, Apply to eye., Disp: , Rfl:    ezetimibe  (ZETIA ) 10 MG tablet, Take 1 tablet (10 mg total) by mouth daily., Disp: 90 tablet, Rfl: 3   famotidine  (PEPCID ) 40 MG tablet, TAKE 1 TABLET BY MOUTH EVERYDAY AT BEDTIME, Disp: 90 tablet, Rfl: 1   furosemide  (LASIX ) 20 MG tablet, Take 20 mg by mouth daily., Disp: , Rfl:    inclisiran (LEQVIO ) 284 MG/1.5ML SOSY injection, Inject 284 mg into the skin once., Disp: , Rfl:    latanoprost (XALATAN) 0.005 % ophthalmic solution, 1 drop at bedtime.,  Disp: , Rfl:    levothyroxine (SYNTHROID) 25 MCG tablet, Take 25 mcg by mouth daily., Disp: , Rfl:    LORazepam (ATIVAN) 1 MG tablet, Take 1 mg by mouth at bedtime as needed (spasms)., Disp: , Rfl:    magnesium  oxide (MAG-OX) 400 MG tablet, Take by mouth as needed., Disp: , Rfl:    meclizine  (ANTIVERT ) 25 MG tablet, TAKE 1 TABLET (25 MG TOTAL) BY MOUTH 2 (TWO) TIMES DAILY AS NEEDED FOR DIZZINESS., Disp: 60 tablet, Rfl: 2   meloxicam  (MOBIC ) 15 MG tablet, Take 1 tablet (15 mg total) by mouth daily., Disp: 30 tablet, Rfl: 0   mometasone  (NASONEX ) 50 MCG/ACT nasal spray, Place 1 spray into the nose in the morning and at bedtime., Disp: 1 each, Rfl: 2   montelukast  (SINGULAIR ) 10 MG tablet, Take 1 tablet (10 mg total) by mouth daily., Disp: 30 tablet, Rfl: 2   naproxen sodium (ALEVE) 220 MG tablet, 1 tablet as needed, Disp: , Rfl:    omeprazole  (PRILOSEC) 20 MG capsule, Take 1 capsule (20 mg total) by mouth 2 (two) times daily. Prilosec 20 mg twice daily for 4 weeks, then reduce to once daily. Can discontinue if no further need., Disp: 60 capsule, Rfl: 3   sodium chloride  (OCEAN) 0.65 % SOLN nasal spray, Place 1  spray into both nostrils as needed for congestion., Disp: 37.5 mL, Rfl: 0   Objective:     There were no vitals filed for this visit.    There is no height or weight on file to calculate BMI.    Physical Exam:    ***   Electronically signed by:  Marshall Skeeter D.[name] 7:50 AM 06/21/23

## 2023-06-22 ENCOUNTER — Ambulatory Visit: Admitting: Sports Medicine

## 2023-06-22 VITALS — BP 120/80 | HR 71 | Ht <= 58 in | Wt 99.0 lb

## 2023-06-22 DIAGNOSIS — M5030 Other cervical disc degeneration, unspecified cervical region: Secondary | ICD-10-CM

## 2023-06-22 DIAGNOSIS — M542 Cervicalgia: Secondary | ICD-10-CM

## 2023-06-22 DIAGNOSIS — S46812D Strain of other muscles, fascia and tendons at shoulder and upper arm level, left arm, subsequent encounter: Secondary | ICD-10-CM

## 2023-06-22 NOTE — Patient Instructions (Signed)
 Tylenol [phone] mg 2-3 times a day for pain relief  Discontinue meloxicam   Can use meloxicam  and aleve as needed limit to 1-2 per week  Call if you would like PT referral

## 2023-07-08 ENCOUNTER — Other Ambulatory Visit: Payer: Self-pay | Admitting: Internal Medicine

## 2023-07-10 ENCOUNTER — Telehealth: Payer: Self-pay | Admitting: Sports Medicine

## 2023-07-10 MED ORDER — MELOXICAM 15 MG PO TABS: 15.0000 mg | ORAL_TABLET | Freq: Every day | ORAL | 0 refills | Status: DC

## 2023-07-10 NOTE — Telephone Encounter (Signed)
 Called patient back no answer LVM stating that Dr. Nickey Barn office visit note stated for her to take  Tylenol [phone] mg 2-3 times a day for pain relief   Can start back up the Meloxicam  daily for 5 days to help with pain   Or can start aleve as needed limit to 1-2 per week   DO NOT TAKE ALEVE AND MELOXICAM  AT THE SAME TIME  Also asked if patient would like the referral sent for PT to help in the long run for the pain.

## 2023-07-10 NOTE — Telephone Encounter (Signed)
 Refilled Meloxicam   Cn you schedule patient for injection therapy if she wants more immediate relief. THANK YOU

## 2023-07-10 NOTE — Telephone Encounter (Signed)
 Patient informed and appointment scheduled.

## 2023-07-10 NOTE — Telephone Encounter (Signed)
 Patient called stating that her neck pain has continued and is getting worse. She asked if there is something else that she can do?  Please advise.

## 2023-07-10 NOTE — Telephone Encounter (Signed)
 Patient called back. She as been taking Tylenol and would be fine to add in Meloxicam  but would need a refill.  She is already doing therapy and will continue but doesn't think it has been helping.  It is worse when sitting and has been using a neck brace which helps a little bit but doesn't know what else to do at this point.

## 2023-07-11 ENCOUNTER — Ambulatory Visit (INDEPENDENT_AMBULATORY_CARE_PROVIDER_SITE_OTHER): Admitting: Sports Medicine

## 2023-07-11 VITALS — BP 110/60 | HR 69 | Ht <= 58 in | Wt 97.6 lb

## 2023-07-11 DIAGNOSIS — M5030 Other cervical disc degeneration, unspecified cervical region: Secondary | ICD-10-CM | POA: Diagnosis not present

## 2023-07-11 DIAGNOSIS — E039 Hypothyroidism, unspecified: Secondary | ICD-10-CM | POA: Diagnosis not present

## 2023-07-11 DIAGNOSIS — M542 Cervicalgia: Secondary | ICD-10-CM

## 2023-07-11 NOTE — Progress Notes (Signed)
 Amanda Cannon D.[name] [address] [place] [address] Phone: [phone]   Assessment and Plan:     1. Neck pain 2. DDD (degenerative disc disease), cervical  -Chronic with exacerbation, subsequent visit - Continued neck pain with increased stiffness, and new complaint of numbness and tingling along left cheek.  Consistent with patient experiencing flare of degenerative changes in cervical spine leading to the neck stiffness and pain.  Unclear if increase in vestibular symptoms and tingling across left cheek or indirectly related to neck symptoms, or if a represents additional pathology - Recommend left-sided C6-7 epidural CSI -Continue Tylenol 500 to 1000 mg tablets 2-3 times a day for day-to-day pain relief - Use meloxicam  15 mg daily as needed for pain.  Recommend limiting chronic NSAIDs to 1-2 doses per week to prevent long-term side effects. - Continue HEP as tolerated  Pertinent previous records reviewed include C-spine MRI 01/22/2020  Follow Up: 2 weeks after epidural to review benefit.  If patient has continued vestibular and neurologic symptoms across left-sided face, could consider neurology referral   Subjective:    Chief Complaint: neck pain  HPI:   05/25/2023 Patient is a 78 year old female with neck pain. Patient states just when she turns her head to the left, moves it up and down, and when she sleeps. When she raises her left arm it sounds like there is cracking in the bones that is not normal. Doing yoga and other things but if she turns her head to fast it is bad. Some times just can't move it.    06/22/2023 Patient states pain is better at night when she takes meloxicam . Neck is stiff when doing exercises ROM is still limited    07/11/23 Patient states that the neck is ok as long as she doesn't move it the wrong way. Vestibular issues are back and the left side of her face is numb.   Relevant Historical Information:  CAD, GERD, osteoporosis, history of PMR, history of breast cancer     Additional pertinent review of systems negative.   Current Outpatient Medications:    amLODipine  (NORVASC ) 5 MG tablet, TAKE 1 TABLET (5 MG TOTAL) BY MOUTH DAILY., Disp: 90 tablet, Rfl: 3   aspirin 81 MG tablet, Take 81 mg by mouth daily., Disp: , Rfl:    Cholecalciferol 25 MCG (1000 UT) tablet, Take by mouth., Disp: , Rfl:    denosumab  (PROLIA ) 60 MG/ML SOLN injection, Inject 60 mg into the skin every 6 (six) months. Administer in upper arm, thigh, or abdomen, Disp: , Rfl:    dorzolamide (TRUSOPT) 2 % ophthalmic solution, Apply to eye., Disp: , Rfl:    ezetimibe  (ZETIA ) 10 MG tablet, Take 1 tablet (10 mg total) by mouth daily., Disp: 90 tablet, Rfl: 3   famotidine  (PEPCID ) 40 MG tablet, TAKE 1 TABLET BY MOUTH EVERYDAY AT BEDTIME, Disp: 90 tablet, Rfl: 1   furosemide  (LASIX ) 20 MG tablet, Take 20 mg by mouth daily., Disp: , Rfl:    inclisiran (LEQVIO ) 284 MG/1.5ML SOSY injection, Inject 284 mg into the skin once., Disp: , Rfl:    latanoprost (XALATAN) 0.005 % ophthalmic solution, 1 drop at bedtime., Disp: , Rfl:    levothyroxine (SYNTHROID) 25 MCG tablet, Take 25 mcg by mouth daily., Disp: , Rfl:    LORazepam (ATIVAN) 1 MG tablet, Take 1 mg by mouth at bedtime as needed (spasms)., Disp: , Rfl:    magnesium  oxide (MAG-OX) 400 MG tablet,  Take by mouth as needed., Disp: , Rfl:    meclizine  (ANTIVERT ) 25 MG tablet, TAKE 1 TABLET (25 MG TOTAL) BY MOUTH 2 (TWO) TIMES DAILY AS NEEDED FOR DIZZINESS., Disp: 60 tablet, Rfl: 2   meloxicam  (MOBIC ) 15 MG tablet, Take 1 tablet (15 mg total) by mouth daily., Disp: 30 tablet, Rfl: 0   mometasone  (NASONEX ) 50 MCG/ACT nasal spray, PLACE 1 SPRAY INTO THE NOSE IN THE MORNING AND AT BEDTIME., Disp: 51 each, Rfl: 0   montelukast  (SINGULAIR ) 10 MG tablet, Take 1 tablet (10 mg total) by mouth daily., Disp: 30 tablet, Rfl: 2   naproxen sodium (ALEVE) 220 MG tablet, 1 tablet as needed, Disp: ,  Rfl:    sodium chloride  (OCEAN) 0.65 % SOLN nasal spray, Place 1 spray into both nostrils as needed for congestion., Disp: 37.5 mL, Rfl: 0   omeprazole  (PRILOSEC) 20 MG capsule, Take 1 capsule (20 mg total) by mouth 2 (two) times daily. Prilosec 20 mg twice daily for 4 weeks, then reduce to once daily. Can discontinue if no further need., Disp: 60 capsule, Rfl: 3   Objective:     Vitals:   07/11/23 0923  BP: 110/60  Pulse: 69  SpO2: 100%  Weight: 97 lb 9.6 oz (44.3 kg)  Height: 4' 8 (1.422 m)      Body mass index is 21.88 kg/m.    Physical Exam:     Neck Exam: Cervical Spine- Posture normal Skin- normal, intact   Neuro:  Strength-   Right Left  Deltoid (C5) 5/5 5/5 Bicep/Brachioradialis (C5/6) 5/5  5/5 Wrist Extension (C6) 5/5 5/5 Tricep (C7) 5/5 5/5 Wrist Flexion (C7) 5/5 5/5 Grip (C8) 5/5 5/5 Finger Abduction (T1) 5/5 5/5   Sensation: intact to light touch in upper extremities bilaterally   Spurling's:  negative bilaterally Neck ROM: Decreased left rotation and sidebending bilaterally TTP: Mild left trapezius, lower cervical paraspinal NTTP: cervical spinous processes,   thoracic paraspinal, right trapezius       Electronically signed by:  Marshall Skeeter D[name] 10:00 AM 07/11/23

## 2023-07-11 NOTE — Patient Instructions (Addendum)
 Good to see you   Tylenol [phone] mg 2-3 times a day for pain relief   - Start meloxicam  15 mg daily x2 weeks.  If still having pain after 2 weeks, complete 3rd-week of NSAID. May use remaining NSAID as needed once daily for pain control.  Do not to use additional over-the-counter NSAIDs (ibuprofen, naproxen, Advil, Aleve, etc.) while taking prescription NSAIDs.  May use Tylenol [phone] mg 2 to 3 times a day for breakthrough pain.  Continue home exercises   Epidural CSI ordered Left sided C6-C7 call [facility] to schedule at [phone]  Follow up 2 weeks after epidural

## 2023-07-12 ENCOUNTER — Other Ambulatory Visit: Payer: Self-pay | Admitting: Internal Medicine

## 2023-07-12 DIAGNOSIS — Z1231 Encounter for screening mammogram for malignant neoplasm of breast: Secondary | ICD-10-CM

## 2023-07-17 ENCOUNTER — Telehealth: Payer: Self-pay | Admitting: Sports Medicine

## 2023-07-17 NOTE — Telephone Encounter (Signed)
 Pt left the following message in our afterhours voicemail.  Getting a neck injection this week (cervical epidural ordered by Dr. Leonce). She wants to know the aftercare instructions/limitations for this procedure as she is going on a trip soon after.

## 2023-07-19 NOTE — Telephone Encounter (Signed)
Response sent to pt via MyChart

## 2023-07-20 ENCOUNTER — Other Ambulatory Visit

## 2023-08-01 ENCOUNTER — Other Ambulatory Visit

## 2023-08-01 DIAGNOSIS — H401131 Primary open-angle glaucoma, bilateral, mild stage: Secondary | ICD-10-CM | POA: Diagnosis not present

## 2023-08-01 NOTE — Discharge Instructions (Signed)

## 2023-08-02 ENCOUNTER — Inpatient Hospital Stay
Admission: RE | Admit: 2023-08-02 | Discharge: 2023-08-02 | Disposition: A | Discharge status: Home / Self Care | Source: Ambulatory Visit | Attending: Sports Medicine

## 2023-08-02 DIAGNOSIS — M4723 Other spondylosis with radiculopathy, cervicothoracic region: Secondary | ICD-10-CM | POA: Diagnosis not present

## 2023-08-02 DIAGNOSIS — M5030 Other cervical disc degeneration, unspecified cervical region: Secondary | ICD-10-CM

## 2023-08-02 DIAGNOSIS — M542 Cervicalgia: Secondary | ICD-10-CM

## 2023-08-02 MED ORDER — IOPAMIDOL (ISOVUE-M 300) INJECTION 61%
1.0000 mL | Freq: Once | INTRAMUSCULAR | Status: AC | PRN
  Administered 2023-08-02: 1 mL via EPIDURAL

## 2023-08-02 MED ORDER — TRIAMCINOLONE ACETONIDE 40 MG/ML IJ SUSP (RADIOLOGY)
60.0000 mg | Freq: Once | INTRAMUSCULAR | Status: AC
Start: 2023-08-02 — End: 2023-08-02
  Administered 2023-08-02: 60 mg via EPIDURAL

## 2023-08-04 ENCOUNTER — Other Ambulatory Visit: Payer: Self-pay | Admitting: Internal Medicine

## 2023-08-05 ENCOUNTER — Other Ambulatory Visit: Payer: Self-pay | Admitting: Internal Medicine

## 2023-08-09 ENCOUNTER — Other Ambulatory Visit: Payer: Self-pay | Admitting: Sports Medicine

## 2023-08-14 ENCOUNTER — Other Ambulatory Visit: Payer: Self-pay | Admitting: Medical Genetics

## 2023-08-15 NOTE — Progress Notes (Unsigned)
 [facility] [address] [place] [address] Phone: [phone]   Assessment and Plan:     There are no diagnoses linked to this encounter.  ***   Pertinent previous records reviewed include ***    Follow Up: ***     Subjective:   I, Amanda Cannon, am serving as a Neurosurgeon for Amanda Cannon  Chief Complaint: neck pain   HPI:    05/25/2023 Patient is a 78 year old female with neck pain. Patient states just when she turns her head to the left, moves it up and down, and when she sleeps. When she raises her left arm it sounds like there is cracking in the bones that is not normal. Doing yoga and other things but if she turns her head to fast it is bad. Some times just can't move it.    06/22/2023 Patient states pain is better at night when she takes meloxicam . Neck is stiff when doing exercises ROM is still limited    07/11/23 Patient states that the neck is ok as long as she doesn't move it the wrong way. Vestibular issues are back and the left side of her face is numb.   08/16/2023 Patient states  Relevant Historical Information: CAD, GERD, osteoporosis, history of PMR, history of breast cancer  Additional pertinent review of systems negative.   Current Outpatient Medications:    amLODipine  (NORVASC ) 5 MG tablet, TAKE 1 TABLET (5 MG TOTAL) BY MOUTH DAILY., Disp: 90 tablet, Rfl: 3   aspirin 81 MG tablet, Take 81 mg by mouth daily., Disp: , Rfl:    Cholecalciferol 25 MCG (1000 UT) tablet, Take by mouth., Disp: , Rfl:    denosumab  (PROLIA ) 60 MG/ML SOLN injection, Inject 60 mg into the skin every 6 (six) months. Administer in upper arm, thigh, or abdomen, Disp: , Rfl:    dorzolamide (TRUSOPT) 2 % ophthalmic solution, Apply to eye., Disp: , Rfl:    ezetimibe  (ZETIA ) 10 MG tablet, Take 1 tablet (10 mg total) by mouth daily., Disp: 90 tablet, Rfl: 3   famotidine  (PEPCID ) 40 MG tablet, TAKE 1 TABLET BY MOUTH  EVERYDAY AT BEDTIME, Disp: 90 tablet, Rfl: 1   furosemide  (LASIX ) 20 MG tablet, Take 20 mg by mouth daily., Disp: , Rfl:    inclisiran (LEQVIO ) 284 MG/1.5ML SOSY injection, Inject 284 mg into the skin once., Disp: , Rfl:    latanoprost (XALATAN) 0.005 % ophthalmic solution, 1 drop at bedtime., Disp: , Rfl:    levothyroxine (SYNTHROID) 25 MCG tablet, Take 25 mcg by mouth daily., Disp: , Rfl:    LORazepam (ATIVAN) 1 MG tablet, Take 1 mg by mouth at bedtime as needed (spasms)., Disp: , Rfl:    magnesium  oxide (MAG-OX) 400 MG tablet, Take by mouth as needed., Disp: , Rfl:    meclizine  (ANTIVERT ) 25 MG tablet, TAKE 1 TABLET (25 MG TOTAL) BY MOUTH 2 (TWO) TIMES DAILY AS NEEDED FOR DIZZINESS., Disp: 60 tablet, Rfl: 2   meloxicam  (MOBIC ) 15 MG tablet, Take 1 tablet (15 mg total) by mouth daily., Disp: 30 tablet, Rfl: 0   mometasone  (NASONEX ) 50 MCG/ACT nasal spray, PLACE 1 SPRAY INTO THE NOSE IN THE MORNING AND AT BEDTIME., Disp: 51 each, Rfl: 0   montelukast  (SINGULAIR ) 10 MG tablet, TAKE 1 TABLET BY MOUTH EVERY DAY, Disp: 90 tablet, Rfl: 0   naproxen sodium (ALEVE) 220 MG tablet, 1 tablet as needed, Disp: , Rfl:  omeprazole  (PRILOSEC) 20 MG capsule, Take 1 capsule (20 mg total) by mouth 2 (two) times daily. Prilosec 20 mg twice daily for 4 weeks, then reduce to once daily. Can discontinue if no further need., Disp: 60 capsule, Rfl: 3   sodium chloride  (OCEAN) 0.65 % SOLN nasal spray, Place 1 spray into both nostrils as needed for congestion., Disp: 37.5 mL, Rfl: 0   Objective:     There were no vitals filed for this visit.    There is no height or weight on file to calculate BMI.    Physical Exam:    ***   Electronically signed by:  [facility] 9:54 AM 08/15/23

## 2023-08-16 ENCOUNTER — Ambulatory Visit: Admitting: Sports Medicine

## 2023-08-16 VITALS — BP 124/80 | HR 78 | Ht <= 58 in | Wt 94.0 lb

## 2023-08-16 DIAGNOSIS — M5030 Other cervical disc degeneration, unspecified cervical region: Secondary | ICD-10-CM

## 2023-08-16 DIAGNOSIS — S46812D Strain of other muscles, fascia and tendons at shoulder and upper arm level, left arm, subsequent encounter: Secondary | ICD-10-CM | POA: Diagnosis not present

## 2023-08-16 DIAGNOSIS — M542 Cervicalgia: Secondary | ICD-10-CM

## 2023-08-16 NOTE — Patient Instructions (Signed)
 Epidural C7-T1 left  Follow up 2 weeks after to discuss

## 2023-08-17 ENCOUNTER — Other Ambulatory Visit (HOSPITAL_COMMUNITY)
Admission: RE | Admit: 2023-08-17 | Discharge: 2023-08-17 | Disposition: A | Discharge status: Home / Self Care | Payer: Self-pay | Source: Ambulatory Visit | Attending: Medical Genetics | Admitting: Medical Genetics

## 2023-08-17 ENCOUNTER — Other Ambulatory Visit: Payer: Self-pay | Admitting: Sports Medicine

## 2023-08-17 ENCOUNTER — Telehealth: Payer: Self-pay | Admitting: Sports Medicine

## 2023-08-17 MED ORDER — MELOXICAM 15 MG PO TABS: 15.0000 mg | ORAL_TABLET | Freq: Every day | ORAL | 0 refills | Status: DC | PRN

## 2023-08-17 NOTE — Telephone Encounter (Signed)
 Meloxicam  has been placed to pharmacy

## 2023-08-17 NOTE — Telephone Encounter (Signed)
 Patient called and said that on the pharmacy website it says that the meloxicam  was prescriber denied. She is verifying it was sent to [facility] on [address]. Please advise.

## 2023-08-17 NOTE — Telephone Encounter (Signed)
 Patient called and asked that a prescription for meloxicam  be sent in because she can not take aspirin because she is going to have another injection. Please advise.

## 2023-08-21 ENCOUNTER — Ambulatory Visit
Admission: RE | Admit: 2023-08-21 | Discharge: 2023-08-21 | Disposition: A | Discharge status: Home / Self Care | Source: Ambulatory Visit | Attending: Internal Medicine | Admitting: Internal Medicine

## 2023-08-21 DIAGNOSIS — Z1231 Encounter for screening mammogram for malignant neoplasm of breast: Secondary | ICD-10-CM | POA: Diagnosis not present

## 2023-08-24 ENCOUNTER — Ambulatory Visit (INDEPENDENT_AMBULATORY_CARE_PROVIDER_SITE_OTHER): Payer: Medicare Other

## 2023-08-24 VITALS — BP 122/77 | HR 75 | Temp 98.3°F | Resp 20 | Ht <= 58 in | Wt 98.0 lb

## 2023-08-24 DIAGNOSIS — E782 Mixed hyperlipidemia: Secondary | ICD-10-CM

## 2023-08-24 MED ORDER — INCLISIRAN SODIUM 284 MG/1.5ML ~~LOC~~ SOSY
284.0000 mg | PREFILLED_SYRINGE | Freq: Once | SUBCUTANEOUS | Status: AC
  Administered 2023-08-24: 284 mg via SUBCUTANEOUS
  Filled 2023-08-24: qty 1.5

## 2023-08-24 NOTE — Discharge Instructions (Signed)

## 2023-08-24 NOTE — Progress Notes (Signed)
 Diagnosis: Hyperlipidemia  Provider:  Chilton Greathouse MD  Procedure: Injection  Leqvio (inclisiran), Dose: 284 mg, Site: subcutaneous, Number of injections: 1  Injection Site(s): Right arm  Post Care:  right arm injection  Discharge: Condition: Good, Destination: Home . AVS Declined  Performed by:  Rico Ala, LPN

## 2023-08-25 ENCOUNTER — Ambulatory Visit
Admission: RE | Admit: 2023-08-25 | Discharge: 2023-08-25 | Disposition: A | Discharge status: Home / Self Care | Source: Ambulatory Visit | Attending: Sports Medicine | Admitting: Sports Medicine

## 2023-08-25 DIAGNOSIS — S46812D Strain of other muscles, fascia and tendons at shoulder and upper arm level, left arm, subsequent encounter: Secondary | ICD-10-CM

## 2023-08-25 DIAGNOSIS — M5030 Other cervical disc degeneration, unspecified cervical region: Secondary | ICD-10-CM

## 2023-08-25 DIAGNOSIS — M47812 Spondylosis without myelopathy or radiculopathy, cervical region: Secondary | ICD-10-CM | POA: Diagnosis not present

## 2023-08-25 DIAGNOSIS — M542 Cervicalgia: Secondary | ICD-10-CM

## 2023-08-25 LAB — GENECONNECT MOLECULAR SCREEN: Genetic Analysis Overall Interpretation: NEGATIVE

## 2023-08-25 MED ORDER — IOPAMIDOL (ISOVUE-M 300) INJECTION 61%
1.0000 mL | Freq: Once | INTRAMUSCULAR | Status: AC | PRN
  Administered 2023-08-25: 1 mL via EPIDURAL

## 2023-08-25 MED ORDER — TRIAMCINOLONE ACETONIDE 40 MG/ML IJ SUSP (RADIOLOGY)
60.0000 mg | Freq: Once | INTRAMUSCULAR | Status: AC
  Administered 2023-08-25: 60 mg via EPIDURAL

## 2023-08-28 ENCOUNTER — Other Ambulatory Visit

## 2023-09-07 ENCOUNTER — Ambulatory Visit: Admitting: Internal Medicine

## 2023-09-07 ENCOUNTER — Encounter: Payer: Self-pay | Admitting: Internal Medicine

## 2023-09-07 ENCOUNTER — Other Ambulatory Visit: Payer: Self-pay

## 2023-09-07 VITALS — BP 134/78 | HR 80 | Temp 97.0°F | Resp 18 | Wt 96.2 lb

## 2023-09-07 DIAGNOSIS — E782 Mixed hyperlipidemia: Secondary | ICD-10-CM

## 2023-09-07 DIAGNOSIS — E039 Hypothyroidism, unspecified: Secondary | ICD-10-CM

## 2023-09-07 DIAGNOSIS — I10 Essential (primary) hypertension: Secondary | ICD-10-CM | POA: Diagnosis not present

## 2023-09-07 NOTE — Assessment & Plan Note (Signed)
 We will check her FLP today.  We added zetia  on her last visit.  Her goal LDL <70.

## 2023-09-07 NOTE — Assessment & Plan Note (Signed)
 She seems euthryoid on questioning today.  We will check her TFT's.

## 2023-09-07 NOTE — Progress Notes (Deleted)
 Orders for [employer] [employer].

## 2023-09-07 NOTE — Assessment & Plan Note (Signed)
 Her BP is controlled.  We will continue her current meds and continue to monitor.

## 2023-09-07 NOTE — Progress Notes (Signed)
 Office Visit  Subjective   Patient ID: Amanda Cannon   DOB: [birth year]   Age: 78 y.o.   MRN: [id number]   Chief Complaint Chief Complaint  Patient presents with   Follow-up    3 month follow up     History of Present Illness The patient is a 78 year old female who presents for a follow-up evaluation of hypertension.  She was diagnosed with HTN in her late 32's.  The patient has not been checking her blood pressure at home. The patient's current medications include: amlodipine  5mg  daily and lasix  20mg  daily. The patient has been tolerating her medications well. The patient denies any headache, visual changes, chest pain, shortness of breath, weakness/numbness, and edema. She reports there have been no other symptoms noted.    The patient also returns today for routine followup on her cholesterol.   Last year, her LDL was 95 with goal <70.  She has statin intolerance in the past.  I did start her on zetia  10mg  daily on her last visit 3 months ago. The patient does have a history of hyperlipidemia, Prinzmetal's Angina, and less than 39% bilateral carotid obstruction 2018.  She had a Bilateral carotid duplex 10/2021 which revealed 1-39% stenosis in the right and left ICA, Antegrade LVA and atypical antegrade RVA.Overall, she states she is doing well and is without any complaints or problems at this time.  She has been on statins in the past with lipitor causing upset stomach.  She has been put on praulent but this was changed to repatha  by her insurance.  She was on repatha  but was having problems with the shot causing pain by the delivery system.  Her insurance switched her to leqvio  284mg  subcut twice a year.  She specifically denies abdominal pain, nausea, vomiting, diarrhea, myalgias, and fatigue. She remains on dietary management as well as a regular exercise program with zetia  10mg  at bedtime and leqvio  284mg  twice a year. She is fasting in anticipation for labs today.     The patient does  have a history of hypothyroidism.  She states her OB/GYN started levothyroxine 25mcg in 04/2022 after she saw her TSH was 6.2.  She is currently on levothyroxine 25mcg daily.  The patient complains still of fatigue.  There is no unexplained weight loss or weight gain, tremors, anxiety, constipation, dry skin or other problems.      Past Medical History Past Medical History:  Diagnosis Date   Alcohol use    Anemia    Anxiety    Asthma    Basal cell carcinoma    forehead   Breast cancer (HCC) 1998   left    Colon polyps    GERD (gastroesophageal reflux disease)    Glaucoma    Hyperlipidemia    Hypertension    Hyponatremia    remote, mild   Mitral valve prolapse    Osteoarthritis    sees rheumatologist   Osteoporosis    sees rheumatologist   Personal history of radiation therapy 1998   PMR (polymyalgia rheumatica) (HCC)    sees rheumatologist   Pneumonia    Prinzmetal angina (HCC)    White matter changes    ? ischemic sm vs per pt     Allergies Allergies  Allergen Reactions   Acetaminophen Cough    Other Reaction(s): Unknown   Beta Adrenergic Blockers     Other Reaction(s): Other  Cognitive issues  Other Reaction(s): Mental Status Changes   Tetrahydrozoline-Zn Sulfate  Other Reaction(s): Mental Status Changes  Prescription eyedrops   Erythromycin     Stomach cramps  Other Reaction(s): Other (See Comments)  Stomach cramps, Stomach cramps   Erythromycin Base Other (See Comments)    Stomach issues   Other Other (See Comments)    Beta-blockers-patient states it makes her crazy   Sulfa Antibiotics Other (See Comments)    Doesn't recall   Turmeric     Lowers BP   Betamethasone Other (See Comments)    Other reaction(s): Not available     Medications  Current Outpatient Medications:    amLODipine  (NORVASC ) 5 MG tablet, TAKE 1 TABLET (5 MG TOTAL) BY MOUTH DAILY., Disp: 90 tablet, Rfl: 3   aspirin 81 MG tablet, Take 81 mg by mouth daily., Disp: , Rfl:     Cholecalciferol 25 MCG (1000 UT) tablet, Take by mouth., Disp: , Rfl:    denosumab  (PROLIA ) 60 MG/ML SOLN injection, Inject 60 mg into the skin every 6 (six) months. Administer in upper arm, thigh, or abdomen, Disp: , Rfl:    dorzolamide (TRUSOPT) 2 % ophthalmic solution, Apply to eye., Disp: , Rfl:    ezetimibe  (ZETIA ) 10 MG tablet, Take 1 tablet (10 mg total) by mouth daily., Disp: 90 tablet, Rfl: 3   famotidine  (PEPCID ) 40 MG tablet, TAKE 1 TABLET BY MOUTH EVERYDAY AT BEDTIME, Disp: 90 tablet, Rfl: 1   furosemide  (LASIX ) 20 MG tablet, Take 20 mg by mouth daily., Disp: , Rfl:    inclisiran (LEQVIO ) 284 MG/1.5ML SOSY injection, Inject 284 mg into the skin once., Disp: , Rfl:    latanoprost (XALATAN) 0.005 % ophthalmic solution, 1 drop at bedtime., Disp: , Rfl:    levothyroxine (SYNTHROID) 25 MCG tablet, Take 25 mcg by mouth daily., Disp: , Rfl:    LORazepam (ATIVAN) 1 MG tablet, Take 1 mg by mouth at bedtime as needed (spasms)., Disp: , Rfl:    magnesium  oxide (MAG-OX) 400 MG tablet, Take by mouth as needed., Disp: , Rfl:    meclizine  (ANTIVERT ) 25 MG tablet, TAKE 1 TABLET (25 MG TOTAL) BY MOUTH 2 (TWO) TIMES DAILY AS NEEDED FOR DIZZINESS., Disp: 60 tablet, Rfl: 2   meloxicam  (MOBIC ) 15 MG tablet, Take 1 tablet (15 mg total) by mouth daily as needed for pain., Disp: 30 tablet, Rfl: 0   mometasone  (NASONEX ) 50 MCG/ACT nasal spray, PLACE 1 SPRAY INTO THE NOSE IN THE MORNING AND AT BEDTIME., Disp: 51 each, Rfl: 0   montelukast  (SINGULAIR ) 10 MG tablet, TAKE 1 TABLET BY MOUTH EVERY DAY, Disp: 90 tablet, Rfl: 0   naproxen sodium (ALEVE) 220 MG tablet, 1 tablet as needed, Disp: , Rfl:    omeprazole  (PRILOSEC) 20 MG capsule, Take 1 capsule (20 mg total) by mouth 2 (two) times daily. Prilosec 20 mg twice daily for 4 weeks, then reduce to once daily. Can discontinue if no further need., Disp: 60 capsule, Rfl: 3   sodium chloride  (OCEAN) 0.65 % SOLN nasal spray, Place 1 spray into both nostrils as needed  for congestion., Disp: 37.5 mL, Rfl: 0   Review of Systems Review of Systems  Constitutional:  Negative for chills and fever.  Eyes:  Negative for blurred vision.  Respiratory:  Negative for shortness of breath.   Cardiovascular:  Negative for chest pain, palpitations and leg swelling.  Gastrointestinal:  Negative for abdominal pain, constipation, diarrhea, nausea and vomiting.  Genitourinary:  Negative for frequency.  Musculoskeletal:  Negative for myalgias.  Skin:  Negative for itching and  rash.  Neurological:  Negative for dizziness, weakness and headaches.  Endo/Heme/Allergies:  Negative for polydipsia.       Objective:    Vitals BP 134/78   Pulse 80   Temp (!) 97 F (36.1 C)   Resp 18   Wt 96 lb 4 oz (43.7 kg)   SpO2 98%   BMI 21.97 kg/m    Physical Examination Physical Exam Constitutional:      Appearance: Normal appearance. She is not ill-appearing.  Neck:     Vascular: No carotid bruit.  Cardiovascular:     Rate and Rhythm: Normal rate and regular rhythm.     Pulses: Normal pulses.     Heart sounds: No murmur heard.    No friction rub. No gallop.  Pulmonary:     Effort: Pulmonary effort is normal. No respiratory distress.     Breath sounds: No wheezing, rhonchi or rales.  Abdominal:     General: Bowel sounds are normal. There is no distension.     Palpations: Abdomen is soft.     Tenderness: There is no abdominal tenderness.  Musculoskeletal:     Right lower leg: No edema.     Left lower leg: No edema.  Skin:    General: Skin is warm and dry.     Findings: No rash.  Neurological:     Mental Status: She is alert.        Assessment & Plan:   Essential hypertension Her BP is controlled.  We will continue her current meds and continue to monitor.  Hypothyroidism She seems euthryoid on questioning today.  We will check her TFT's.  Hyperlipidemia We will check her FLP today.  We added zetia  on her last visit.  Her goal LDL <70.    Return in  about 3 months (around 12/08/2023).   Selinda Fleeta Finger, MD

## 2023-09-07 NOTE — Addendum Note (Signed)
 Addended by: Twilia Yaklin on: 09/07/2023 02:11 PM   Modules accepted: Orders

## 2023-09-11 ENCOUNTER — Ambulatory Visit: Admitting: Sports Medicine

## 2023-09-13 ENCOUNTER — Other Ambulatory Visit: Payer: Self-pay | Admitting: Sports Medicine

## 2023-09-14 ENCOUNTER — Ambulatory Visit: Admitting: Internal Medicine

## 2023-09-14 VITALS — BP 110/60 | HR 75 | Temp 97.8°F | Resp 18 | Wt 96.0 lb

## 2023-09-14 DIAGNOSIS — M5134 Other intervertebral disc degeneration, thoracic region: Secondary | ICD-10-CM

## 2023-09-14 DIAGNOSIS — M47812 Spondylosis without myelopathy or radiculopathy, cervical region: Secondary | ICD-10-CM | POA: Insufficient documentation

## 2023-09-14 HISTORY — DX: Spondylosis without myelopathy or radiculopathy, cervical region: M47.812

## 2023-09-14 MED ORDER — GABAPENTIN 100 MG PO CAPS: 100.0000 mg | ORAL_CAPSULE | Freq: Two times a day (BID) | ORAL | 1 refills | Status: DC

## 2023-09-14 NOTE — Progress Notes (Signed)
 Office Visit  Subjective   Patient ID: JARRETT CHICOINE   DOB: Dec 04, 1945   Age: 78 y.o.   MRN: [id number]   Chief Complaint No chief complaint on file.    History of Present Illness The patient is a 78 yo female who comes in today to discuss her neck pain.  She was seen by sports medicine with Dr. Leonce in 05/2023 where she had pain of her neck where she had problems when she turns her head to the left, moves it up and down, and when she sleeps. When she raises her left arm it sounds like there is cracking in the bones that is not normal.  She states she has never had a xray done in the last 2-3 years.  Dr. Leonce felt she had a cervical DDD and strain of her left trapezious muscle.  She underwent a C7-T1 epidural CSI which was performed on 08/02/2023 but this did not help and she went for a second ESI about 2 weeks.  This injection did not help as well.  He recommended using tylenol 500 to 1000 mg tablets 2-3 times a day for day-to-day pain relief and use meloxicam  but the patient states it has not helped.   She cannot ride in car, go shopping, play games, move her head down or do things anymore.  She is having pain of her entire neck down to her shoulder.   She has found a new lump over her midline lower back/thoracic area.  There is no pain in her lower back.  She states she is getting depressed as she cannot do anything physically and she is having crying episodes.    She denies any weakness/numbness of hands or feet or loss of bowel/bladder function.       Past Medical History Past Medical History:  Diagnosis Date   Alcohol use    Anemia    Anxiety    Asthma    Basal cell carcinoma    forehead   Breast cancer (HCC) 1998   left    Colon polyps    GERD (gastroesophageal reflux disease)    Glaucoma    Hyperlipidemia    Hypertension    Hyponatremia    remote, mild   Mitral valve prolapse    Osteoarthritis    sees rheumatologist   Osteoporosis    sees rheumatologist    Personal history of radiation therapy 1998   PMR (polymyalgia rheumatica) (HCC)    sees rheumatologist   Pneumonia    Prinzmetal angina (HCC)    White matter changes    ? ischemic sm vs per pt     Allergies Allergies  Allergen Reactions   Acetaminophen Cough    Other Reaction(s): Unknown   Beta Adrenergic Blockers     Other Reaction(s): Other  Cognitive issues  Other Reaction(s): Mental Status Changes   Tetrahydrozoline-Zn Sulfate     Other Reaction(s): Mental Status Changes  Prescription eyedrops   Erythromycin     Stomach cramps  Other Reaction(s): Other (See Comments)  Stomach cramps, Stomach cramps   Erythromycin Base Other (See Comments)    Stomach issues   Other Other (See Comments)    Beta-blockers-patient states it makes her crazy   Sulfa Antibiotics Other (See Comments)    Doesn't recall   Turmeric     Lowers BP   Betamethasone Other (See Comments)    Other reaction(s): Not available     Medications  Current Outpatient Medications:    gabapentin  (  NEURONTIN ) 100 MG capsule, Take 1 capsule (100 mg total) by mouth 2 (two) times daily., Disp: 60 capsule, Rfl: 1   amLODipine  (NORVASC ) 5 MG tablet, TAKE 1 TABLET (5 MG TOTAL) BY MOUTH DAILY., Disp: 90 tablet, Rfl: 3   aspirin 81 MG tablet, Take 81 mg by mouth daily., Disp: , Rfl:    Cholecalciferol 25 MCG (1000 UT) tablet, Take by mouth., Disp: , Rfl:    denosumab  (PROLIA ) 60 MG/ML SOLN injection, Inject 60 mg into the skin every 6 (six) months. Administer in upper arm, thigh, or abdomen, Disp: , Rfl:    dorzolamide (TRUSOPT) 2 % ophthalmic solution, Apply to eye., Disp: , Rfl:    ezetimibe  (ZETIA ) 10 MG tablet, Take 1 tablet (10 mg total) by mouth daily., Disp: 90 tablet, Rfl: 3   famotidine  (PEPCID ) 40 MG tablet, TAKE 1 TABLET BY MOUTH EVERYDAY AT BEDTIME, Disp: 90 tablet, Rfl: 1   furosemide  (LASIX ) 20 MG tablet, Take 20 mg by mouth daily., Disp: , Rfl:    inclisiran (LEQVIO ) 284 MG/1.5ML SOSY injection,  Inject 284 mg into the skin once., Disp: , Rfl:    latanoprost (XALATAN) 0.005 % ophthalmic solution, 1 drop at bedtime., Disp: , Rfl:    levothyroxine (SYNTHROID) 25 MCG tablet, Take 25 mcg by mouth daily., Disp: , Rfl:    LORazepam (ATIVAN) 1 MG tablet, Take 1 mg by mouth at bedtime as needed (spasms)., Disp: , Rfl:    magnesium  oxide (MAG-OX) 400 MG tablet, Take by mouth as needed., Disp: , Rfl:    meclizine  (ANTIVERT ) 25 MG tablet, TAKE 1 TABLET (25 MG TOTAL) BY MOUTH 2 (TWO) TIMES DAILY AS NEEDED FOR DIZZINESS., Disp: 60 tablet, Rfl: 2   meloxicam  (MOBIC ) 15 MG tablet, Take 1 tablet (15 mg total) by mouth daily as needed for pain., Disp: 30 tablet, Rfl: 0   mometasone  (NASONEX ) 50 MCG/ACT nasal spray, PLACE 1 SPRAY INTO THE NOSE IN THE MORNING AND AT BEDTIME., Disp: 51 each, Rfl: 0   montelukast  (SINGULAIR ) 10 MG tablet, TAKE 1 TABLET BY MOUTH EVERY DAY, Disp: 90 tablet, Rfl: 0   naproxen sodium (ALEVE) 220 MG tablet, 1 tablet as needed, Disp: , Rfl:    omeprazole  (PRILOSEC) 20 MG capsule, Take 1 capsule (20 mg total) by mouth 2 (two) times daily. Prilosec 20 mg twice daily for 4 weeks, then reduce to once daily. Can discontinue if no further need., Disp: 60 capsule, Rfl: 3   sodium chloride  (OCEAN) 0.65 % SOLN nasal spray, Place 1 spray into both nostrils as needed for congestion., Disp: 37.5 mL, Rfl: 0   Review of Systems Review of Systems  Constitutional:  Negative for chills and fever.  Respiratory:  Negative for shortness of breath.   Cardiovascular:  Negative for chest pain.  Musculoskeletal:  Positive for neck pain. Negative for back pain and myalgias.  Neurological:  Negative for dizziness, focal weakness, weakness and headaches.  Psychiatric/Behavioral:  Positive for depression.        Objective:    Vitals BP 110/60   Pulse 75   Temp 97.8 F (36.6 C)   Resp 18   Wt 96 lb (43.5 kg)   SpO2 96%   BMI 21.91 kg/m    Physical Examination Physical Exam Constitutional:       Appearance: Normal appearance. She is not ill-appearing.  Cardiovascular:     Rate and Rhythm: Normal rate and regular rhythm.     Pulses: Normal pulses.  Heart sounds: No murmur heard.    No friction rub. No gallop.  Pulmonary:     Effort: Pulmonary effort is normal. No respiratory distress.     Breath sounds: No wheezing, rhonchi or rales.  Abdominal:     General: Bowel sounds are normal. There is no distension.     Palpations: Abdomen is soft.     Tenderness: There is no abdominal tenderness.  Musculoskeletal:     Right lower leg: No edema.     Left lower leg: No edema.  Skin:    General: Skin is warm and dry.     Findings: No rash.  Neurological:     Mental Status: She is alert.     Comments: Strength is 5/5 in upper extremities and normal hand grip strength.        Assessment & Plan:   Cervical spondylosis I think she is probably going to need a repeat MRI of her cervical spine.  Her MRI from 2021 was reviewed.  She has a new lump in her in her mid thoracic area where we will get a plain film.  She denies any pain over this.  I am going to refer her to neurosurgery for her cervical spondylosis.  We will start her on low dose gabapentin  to start at night and if tolerated, start in AM twice a day.    No follow-ups on file.   Selinda Fleeta Finger, MD

## 2023-09-14 NOTE — Assessment & Plan Note (Signed)
 I think she is probably going to need a repeat MRI of her cervical spine.  Her MRI from 2021 was reviewed.  She has a new lump in her in her mid thoracic area where we will get a plain film.  She denies any pain over this.  I am going to refer her to neurosurgery for her cervical spondylosis.  We will start her on low dose gabapentin  to start at night and if tolerated, start in AM twice a day.

## 2023-09-15 DIAGNOSIS — E782 Mixed hyperlipidemia: Secondary | ICD-10-CM | POA: Diagnosis not present

## 2023-09-15 DIAGNOSIS — E039 Hypothyroidism, unspecified: Secondary | ICD-10-CM | POA: Diagnosis not present

## 2023-09-16 LAB — CMP14 + ANION GAP
ALT: 31 IU/L (ref 0–32)
AST: 32 IU/L (ref 0–40)
Albumin: 4.4 g/dL (ref 3.8–4.8)
Alkaline Phosphatase: 121 IU/L (ref 44–121)
Anion Gap: 18 mmol/L (ref 10.0–18.0)
BUN/Creatinine Ratio: 26 (ref 12–28)
BUN: 26 mg/dL (ref 8–27)
Bilirubin Total: 0.3 mg/dL (ref 0.0–1.2)
CO2: 22 mmol/L (ref 20–29)
Calcium: 9.5 mg/dL (ref 8.7–10.3)
Chloride: 97 mmol/L (ref 96–106)
Creatinine, Ser: 1 mg/dL (ref 0.57–1.00)
Globulin, Total: 2.7 g/dL (ref 1.5–4.5)
Glucose: 75 mg/dL (ref 70–99)
Potassium: 4.7 mmol/L (ref 3.5–5.2)
Sodium: 137 mmol/L (ref 134–144)
Total Protein: 7.1 g/dL (ref 6.0–8.5)
eGFR: 58 mL/min/1.73 — ABNORMAL LOW (ref >59)

## 2023-09-16 LAB — TSH: TSH: 4.01 u[IU]/mL (ref 0.450–4.500)

## 2023-09-16 LAB — LIPID PANEL
Chol/HDL Ratio: 2.1 ratio (ref 0.0–4.4)
Cholesterol, Total: 204 mg/dL — ABNORMAL HIGH (ref 100–199)
HDL: 95 mg/dL (ref >39)
LDL Chol Calc (NIH): 75 mg/dL (ref 0–99)
Triglycerides: 213 mg/dL — ABNORMAL HIGH (ref 0–149)
VLDL Cholesterol Cal: 34 mg/dL (ref 5–40)

## 2023-09-16 LAB — T4, FREE: Free T4: 1.19 ng/dL (ref 0.82–1.77)

## 2023-09-21 ENCOUNTER — Ambulatory Visit
Admission: RE | Admit: 2023-09-21 | Discharge: 2023-09-21 | Disposition: A | Discharge status: Home / Self Care | Source: Ambulatory Visit | Attending: Internal Medicine | Admitting: Internal Medicine

## 2023-09-21 DIAGNOSIS — M4312 Spondylolisthesis, cervical region: Secondary | ICD-10-CM | POA: Diagnosis not present

## 2023-09-21 DIAGNOSIS — M4726 Other spondylosis with radiculopathy, lumbar region: Secondary | ICD-10-CM | POA: Diagnosis not present

## 2023-09-21 DIAGNOSIS — M5114 Intervertebral disc disorders with radiculopathy, thoracic region: Secondary | ICD-10-CM | POA: Diagnosis not present

## 2023-09-21 DIAGNOSIS — M47812 Spondylosis without myelopathy or radiculopathy, cervical region: Secondary | ICD-10-CM

## 2023-09-21 DIAGNOSIS — M4722 Other spondylosis with radiculopathy, cervical region: Secondary | ICD-10-CM | POA: Diagnosis not present

## 2023-09-21 DIAGNOSIS — M5134 Other intervertebral disc degeneration, thoracic region: Secondary | ICD-10-CM

## 2023-09-28 ENCOUNTER — Ambulatory Visit: Admitting: Internal Medicine

## 2023-09-28 VITALS — BP 118/70 | HR 83 | Temp 97.8°F | Resp 18 | Wt 98.2 lb

## 2023-09-28 DIAGNOSIS — M542 Cervicalgia: Secondary | ICD-10-CM | POA: Diagnosis not present

## 2023-09-28 DIAGNOSIS — M5412 Radiculopathy, cervical region: Secondary | ICD-10-CM | POA: Insufficient documentation

## 2023-09-28 HISTORY — DX: Cervicalgia: M54.2

## 2023-09-28 HISTORY — DX: Radiculopathy, cervical region: M54.12

## 2023-09-28 MED ORDER — OXYCODONE HCL 5 MG PO TABS: 2.5000 mg | ORAL_TABLET | Freq: Three times a day (TID) | ORAL | 0 refills | Status: DC | PRN

## 2023-09-28 MED ORDER — DULOXETINE HCL 30 MG PO CPEP: 30.0000 mg | ORAL_CAPSULE | Freq: Every day | ORAL | 3 refills | Status: DC

## 2023-09-28 NOTE — Progress Notes (Signed)
 Office Visit  Subjective   Patient ID: Amanda Cannon   DOB: Dec 15, 1945   Age: 78 y.o.   MRN: [id number]   Chief Complaint Chief Complaint  Patient presents with   office visit    Patient here to discuss mental health and spinal neck pain     History of Present Illness The patient is a 78 yo female who comes in today to discuss her neck pain and depression.  I saw her 2 weeks ago where I felt she needed a MRI of her cervical spine as her last one was done in 2021.  She had a new lump on her mid thoracic spine at that time.  We ordered plain films of her cervical and thoracic spine which were done on 09/21/2023 but have not been read yet.  When I saw her 2 weeks ago, I started her on gabapentin  100mg  which she only took one dose and she states it made her depressed.  She stopped taking the gabapentin  after one dose and today she states that her pain is usually a 8 out 10 on the pain scale.  She also states she is more depressed and she believes it is due to the pain.  I also referred her to neurosurgery for her cervical spondylosis and she tells me that she has an appointment with them on 10/12/2023.  She has an appointment with [facility] who she contacted on the phone and she was told we will talk about it when she comes for her appointment.  Again, she was seen by sports medicine with Dr. Leonce in 05/2023 where she had pain of her neck where she had problems when she turns her head to the left, moves it up and down, and when she sleeps. When she raises her left arm it sounds like there is cracking in the bones that is not normal.  She states she has never had a xray done in the last 2-3 years.  Dr. Leonce felt she had a cervical DDD and strain of her left trapezious muscle.  She underwent a C7-T1 epidural CSI which was performed on 08/02/2023 but this did not help and she went for a second ESI about 2 weeks.  This injection did not help as well.  He recommended using tylenol 500 to  1000 mg tablets 2-3 times a day for day-to-day pain relief and use meloxicam  but the patient states it has not helped.   She cannot ride in car, go shopping, play games, move her head down or do things anymore.  She is having pain of her entire neck down to her shoulder.   She has found a new lump over her midline lower back/thoracic area.  There is no pain in her lower back.  She states she is getting depressed as she cannot do anything physically and she is having crying episodes.    She denies any weakness/numbness of hands or feet or loss of bowel/bladder function.     Past Medical History Past Medical History:  Diagnosis Date   Alcohol use    Anemia    Anxiety    Asthma    Basal cell carcinoma    forehead   Breast cancer (HCC) 1998   left    Colon polyps    GERD (gastroesophageal reflux disease)    Glaucoma    Hyperlipidemia    Hypertension    Hyponatremia    remote, mild   Mitral valve prolapse    Osteoarthritis  sees rheumatologist   Osteoporosis    sees rheumatologist   Personal history of radiation therapy 1998   PMR (polymyalgia rheumatica) (HCC)    sees rheumatologist   Pneumonia    Prinzmetal angina (HCC)    White matter changes    ? ischemic sm vs per pt     Allergies Allergies  Allergen Reactions   Acetaminophen Cough    Other Reaction(s): Unknown   Beta Adrenergic Blockers     Other Reaction(s): Other  Cognitive issues  Other Reaction(s): Mental Status Changes   Tetrahydrozoline-Zn Sulfate     Other Reaction(s): Mental Status Changes  Prescription eyedrops   Erythromycin     Stomach cramps  Other Reaction(s): Other (See Comments)  Stomach cramps, Stomach cramps   Erythromycin Base Other (See Comments)    Stomach issues   Other Other (See Comments)    Beta-blockers-patient states it makes her crazy   Sulfa Antibiotics Other (See Comments)    Doesn't recall   Turmeric     Lowers BP   Betamethasone Other (See Comments)    Other  reaction(s): Not available     Medications  Current Outpatient Medications:    amLODipine  (NORVASC ) 5 MG tablet, TAKE 1 TABLET (5 MG TOTAL) BY MOUTH DAILY., Disp: 90 tablet, Rfl: 3   aspirin 81 MG tablet, Take 81 mg by mouth daily., Disp: , Rfl:    Cholecalciferol 25 MCG (1000 UT) tablet, Take by mouth., Disp: , Rfl:    denosumab  (PROLIA ) 60 MG/ML SOLN injection, Inject 60 mg into the skin every 6 (six) months. Administer in upper arm, thigh, or abdomen, Disp: , Rfl:    dorzolamide (TRUSOPT) 2 % ophthalmic solution, Apply to eye., Disp: , Rfl:    ezetimibe  (ZETIA ) 10 MG tablet, Take 1 tablet (10 mg total) by mouth daily., Disp: 90 tablet, Rfl: 3   famotidine  (PEPCID ) 40 MG tablet, TAKE 1 TABLET BY MOUTH EVERYDAY AT BEDTIME, Disp: 90 tablet, Rfl: 1   furosemide  (LASIX ) 20 MG tablet, Take 20 mg by mouth daily., Disp: , Rfl:    gabapentin  (NEURONTIN ) 100 MG capsule, Take 1 capsule (100 mg total) by mouth 2 (two) times daily., Disp: 60 capsule, Rfl: 1   inclisiran (LEQVIO ) 284 MG/1.5ML SOSY injection, Inject 284 mg into the skin once., Disp: , Rfl:    latanoprost (XALATAN) 0.005 % ophthalmic solution, 1 drop at bedtime., Disp: , Rfl:    levothyroxine (SYNTHROID) 25 MCG tablet, Take 25 mcg by mouth daily., Disp: , Rfl:    LORazepam (ATIVAN) 1 MG tablet, Take 1 mg by mouth at bedtime as needed (spasms)., Disp: , Rfl:    magnesium  oxide (MAG-OX) 400 MG tablet, Take by mouth as needed., Disp: , Rfl:    meclizine  (ANTIVERT ) 25 MG tablet, TAKE 1 TABLET (25 MG TOTAL) BY MOUTH 2 (TWO) TIMES DAILY AS NEEDED FOR DIZZINESS., Disp: 60 tablet, Rfl: 2   meloxicam  (MOBIC ) 15 MG tablet, Take 1 tablet (15 mg total) by mouth daily as needed for pain., Disp: 30 tablet, Rfl: 0   mometasone  (NASONEX ) 50 MCG/ACT nasal spray, PLACE 1 SPRAY INTO THE NOSE IN THE MORNING AND AT BEDTIME., Disp: 51 each, Rfl: 0   montelukast  (SINGULAIR ) 10 MG tablet, TAKE 1 TABLET BY MOUTH EVERY DAY, Disp: 90 tablet, Rfl: 0   naproxen  sodium (ALEVE) 220 MG tablet, 1 tablet as needed, Disp: , Rfl:    omeprazole  (PRILOSEC) 20 MG capsule, Take 1 capsule (20 mg total) by mouth 2 (two)  times daily. Prilosec 20 mg twice daily for 4 weeks, then reduce to once daily. Can discontinue if no further need., Disp: 60 capsule, Rfl: 3   sodium chloride  (OCEAN) 0.65 % SOLN nasal spray, Place 1 spray into both nostrils as needed for congestion., Disp: 37.5 mL, Rfl: 0   Review of Systems Review of Systems  Constitutional:  Negative for chills and fever.  Eyes:  Negative for blurred vision.  Respiratory:  Negative for shortness of breath.   Cardiovascular:  Negative for chest pain.  Gastrointestinal:  Negative for constipation and diarrhea.  Genitourinary:  Negative for frequency.  Neurological:  Negative for dizziness, focal weakness, weakness and headaches.  Psychiatric/Behavioral:  Positive for depression. The patient is nervous/anxious.        Objective:    Vitals BP 118/70   Pulse 83   Temp 97.8 F (36.6 C)   Resp 18   Wt 98 lb 4 oz (44.6 kg)   SpO2 98%   BMI 22.43 kg/m    Physical Examination Physical Exam Constitutional:      Appearance: Normal appearance. She is not ill-appearing.  Cardiovascular:     Rate and Rhythm: Normal rate and regular rhythm.     Pulses: Normal pulses.     Heart sounds: No murmur heard.    No friction rub. No gallop.  Pulmonary:     Effort: Pulmonary effort is normal. No respiratory distress.     Breath sounds: No wheezing, rhonchi or rales.  Abdominal:     General: Bowel sounds are normal. There is no distension.     Palpations: Abdomen is soft.     Tenderness: There is no abdominal tenderness.  Musculoskeletal:     Right lower leg: No edema.     Left lower leg: No edema.  Skin:    General: Skin is warm and dry.     Findings: No rash.  Neurological:     General: No focal deficit present.     Mental Status: She is alert and oriented to person, place, and time.     Comments: Her  strength in her upper and lower extremities are 5/5 throughout.        Assessment & Plan:   Cervical radiculopathy She states that  no one is doing anything for her but I informed her we got xrays, we tried to start her on medications that she did not even try and referred her to neurosurgery where she got an expedited visit.  At this point, I am going to start her on cymbalta  30mg  daily for her pain and her depression.  I am going to give her oxycodone  2.5mg  po q 8 hrs prn but she is not to take this near her lorazepam where she states she only takes this at night.  Warnings with benzos and opiods were discussed.  I am going to follow up on the xrays they have not read yet.  We will also get a MRI of her cervical spine.  She will followup with neurosurgery in 2 weeks.    No follow-ups on file.   Selinda Fleeta Finger, MD

## 2023-09-28 NOTE — Assessment & Plan Note (Signed)
 She states that  no one is doing anything for her but I informed her we got xrays, we tried to start her on medications that she did not even try and referred her to neurosurgery where she got an expedited visit.  At this point, I am going to start her on cymbalta  30mg  daily for her pain and her depression.  I am going to give her oxycodone  2.5mg  po q 8 hrs prn but she is not to take this near her lorazepam where she states she only takes this at night.  Warnings with benzos and opiods were discussed.  I am going to follow up on the xrays they have not read yet.  We will also get a MRI of her cervical spine.  She will followup with neurosurgery in 2 weeks.

## 2023-10-03 DIAGNOSIS — M5136 Other intervertebral disc degeneration, lumbar region with discogenic back pain only: Secondary | ICD-10-CM | POA: Diagnosis not present

## 2023-10-03 DIAGNOSIS — M810 Age-related osteoporosis without current pathological fracture: Secondary | ICD-10-CM | POA: Diagnosis not present

## 2023-10-03 DIAGNOSIS — M353 Polymyalgia rheumatica: Secondary | ICD-10-CM | POA: Diagnosis not present

## 2023-10-03 DIAGNOSIS — M654 Radial styloid tenosynovitis [de Quervain]: Secondary | ICD-10-CM | POA: Diagnosis not present

## 2023-10-03 DIAGNOSIS — R42 Dizziness and giddiness: Secondary | ICD-10-CM | POA: Diagnosis not present

## 2023-10-03 DIAGNOSIS — M5030 Other cervical disc degeneration, unspecified cervical region: Secondary | ICD-10-CM | POA: Diagnosis not present

## 2023-10-03 DIAGNOSIS — Z6821 Body mass index (BMI) 21.0-21.9, adult: Secondary | ICD-10-CM | POA: Diagnosis not present

## 2023-10-03 DIAGNOSIS — M1991 Primary osteoarthritis, unspecified site: Secondary | ICD-10-CM | POA: Diagnosis not present

## 2023-10-03 DIAGNOSIS — M25551 Pain in right hip: Secondary | ICD-10-CM | POA: Diagnosis not present

## 2023-10-04 ENCOUNTER — Encounter: Payer: Self-pay | Admitting: Internal Medicine

## 2023-10-04 ENCOUNTER — Ambulatory Visit (HOSPITAL_COMMUNITY)
Admission: RE | Admit: 2023-10-04 | Discharge: 2023-10-04 | Disposition: A | Discharge status: Home / Self Care | Source: Ambulatory Visit | Attending: Internal Medicine | Admitting: Internal Medicine

## 2023-10-04 DIAGNOSIS — M50121 Cervical disc disorder at C4-C5 level with radiculopathy: Secondary | ICD-10-CM | POA: Diagnosis not present

## 2023-10-04 DIAGNOSIS — M4802 Spinal stenosis, cervical region: Secondary | ICD-10-CM | POA: Diagnosis not present

## 2023-10-04 DIAGNOSIS — M4312 Spondylolisthesis, cervical region: Secondary | ICD-10-CM | POA: Diagnosis not present

## 2023-10-04 DIAGNOSIS — M5412 Radiculopathy, cervical region: Secondary | ICD-10-CM | POA: Diagnosis not present

## 2023-10-04 DIAGNOSIS — M5013 Cervical disc disorder with radiculopathy, cervicothoracic region: Secondary | ICD-10-CM | POA: Diagnosis not present

## 2023-10-06 ENCOUNTER — Ambulatory Visit: Payer: Self-pay

## 2023-10-06 NOTE — Progress Notes (Signed)
 Patient has been informed,Her neuro appt is next week and she will be going. Also she can not take any strong pain medications so she is not taking the oxycodone  as it makes her sick. She said she is okay with just taking otc counter aleve at this time. If you know of anything else she can do just let her know.

## 2023-10-06 NOTE — Progress Notes (Unsigned)
 Referring Physician:  Fleeta Valeria Mayo, MD [address] [place],  [place] [address]  Primary Physician:  Amanda Valeria Mayo, MD  History of Present Illness: 10/10/2023 [name] is here today with a chief complaint of acute on chronic neck pain that radiates into bilateral shoulders x 3 months.  She also complains of limited range of motion and that she has pain when she turns her head, rides in the car, tries to sleep.  Pain primarily goes down the left arm and it is stabbing in nature.  She has some numbness in her fingertips and does feel as though she is dropping things more often.  Because of her pain she has not been as active and unfortunately had some hallucinations when taking oxycodone  so the pain is still not controlled.  She underwent 2 left-sided C7-T1 ESI which only helped for 1 day.  She also endorses some headaches, inability to find words easily with her speech, and memory loss over the past couple of months.  Denies saddle anesthesia or incontinence of bowel or bladder.   Weakness: none Bowel/Bladder Dysfunction: none  Conservative measures:  Physical therapy:  has not participated in Multimodal medical therapy including regular antiinflammatories:  cymbalta , gabapentin , meloxicam , naproxen, oxycodone  Injections:   08/25/23: Left C7-T1 ESI 08/02/23: Left C7-T1 ESI  Past Surgery: no spinal surgeries     The symptoms are causing a significant impact on the patient's life.   Review of Systems:  A 10 point review of systems is negative, except for the pertinent positives and negatives detailed in the HPI.  Past Medical History: Past Medical History:  Diagnosis Date   Alcohol use    Anemia    Anxiety    Asthma    Basal cell carcinoma    forehead   Breast cancer (HCC) 1998   left    Colon polyps    GERD (gastroesophageal reflux disease)    Glaucoma    Hyperlipidemia    Hypertension    Hyponatremia    remote, mild   Mitral valve prolapse     Osteoarthritis    sees rheumatologist   Osteoporosis    sees rheumatologist   Personal history of radiation therapy 1998   PMR (polymyalgia rheumatica) (HCC)    sees rheumatologist   Pneumonia    Prinzmetal angina (HCC)    White matter changes    ? ischemic sm vs per pt    Past Surgical History: Past Surgical History:  Procedure Laterality Date   ABDOMINAL HYSTERECTOMY     BREAST LUMPECTOMY Left 1998   cancer   CESAREAN SECTION     x 4   COLONOSCOPY  07/2012   in [facility]   HERNIA REPAIR  2005   abdominal hernia repair   HYSTEROTOMY     LYMPH NODE DISSECTION Left    POLYPECTOMY      Allergies: Allergies as of 10/10/2023 - Review Complete 10/10/2023  Allergen Reaction Noted   Acetaminophen Cough 07/15/2015   Beta adrenergic blockers  12/15/2020   Tetrahydrozoline-zn sulfate  09/21/2022   Erythromycin  05/06/2016   Erythromycin base Other (See Comments)    Other Other (See Comments) 05/06/2016   Sulfa antibiotics Other (See Comments) 05/06/2016   Turmeric  04/10/2017   Betamethasone Other (See Comments) 11/09/2021   Oxycodone -acetaminophen Nausea And Vomiting and Palpitations 10/10/2023    Medications: Outpatient Encounter Medications as of 10/10/2023  Medication Sig   amLODipine  (NORVASC ) 5 MG tablet TAKE 1 TABLET (5  MG TOTAL) BY MOUTH DAILY.   aspirin 81 MG tablet Take 81 mg by mouth daily.   Cholecalciferol 25 MCG (1000 UT) tablet Take by mouth.   denosumab  (PROLIA ) 60 MG/ML SOLN injection Inject 60 mg into the skin every 6 (six) months. Administer in upper arm, thigh, or abdomen   dorzolamide (TRUSOPT) 2 % ophthalmic solution Apply to eye.   DULoxetine  (CYMBALTA ) 30 MG capsule Take 1 capsule (30 mg total) by mouth daily.   ezetimibe  (ZETIA ) 10 MG tablet Take 1 tablet (10 mg total) by mouth daily.   famotidine  (PEPCID ) 40 MG tablet TAKE 1 TABLET BY MOUTH EVERYDAY AT BEDTIME   furosemide  (LASIX ) 20 MG tablet Take 20 mg by mouth daily.   inclisiran (LEQVIO ) 284  MG/1.5ML SOSY injection Inject 284 mg into the skin once.   latanoprost (XALATAN) 0.005 % ophthalmic solution 1 drop at bedtime.   levothyroxine (SYNTHROID) 25 MCG tablet Take 25 mcg by mouth daily.   LORazepam (ATIVAN) 1 MG tablet Take 1 mg by mouth at bedtime as needed (spasms).   magnesium  oxide (MAG-OX) 400 MG tablet Take by mouth as needed.   meclizine  (ANTIVERT ) 25 MG tablet TAKE 1 TABLET (25 MG TOTAL) BY MOUTH 2 (TWO) TIMES DAILY AS NEEDED FOR DIZZINESS.   montelukast  (SINGULAIR ) 10 MG tablet TAKE 1 TABLET BY MOUTH EVERY DAY   naproxen sodium (ALEVE) 220 MG tablet 1 tablet as needed   sodium chloride  (OCEAN) 0.65 % SOLN nasal spray Place 1 spray into both nostrils as needed for congestion.   [DISCONTINUED] gabapentin  (NEURONTIN ) 100 MG capsule Take 1 capsule (100 mg total) by mouth 2 (two) times daily.   [DISCONTINUED] meloxicam  (MOBIC ) 15 MG tablet Take 1 tablet (15 mg total) by mouth daily as needed for pain.   [DISCONTINUED] mometasone  (NASONEX ) 50 MCG/ACT nasal spray PLACE 1 SPRAY INTO THE NOSE IN THE MORNING AND AT BEDTIME.   [DISCONTINUED] omeprazole  (PRILOSEC) 20 MG capsule Take 1 capsule (20 mg total) by mouth 2 (two) times daily. Prilosec 20 mg twice daily for 4 weeks, then reduce to once daily. Can discontinue if no further need.   [DISCONTINUED] oxyCODONE  (OXY IR/ROXICODONE ) 5 MG immediate release tablet Take 0.5 tablets (2.5 mg total) by mouth every 8 (eight) hours as needed for severe pain (pain score 7-10) (do not take within 4 hours of your lorazepam).   No facility-administered encounter medications on file as of 10/10/2023.    Social History: Social History   Tobacco Use   Smoking status: Former    Current packs/day: 0.00    Average packs/day: 1 pack/day for 7.0 years (7.0 ttl pk-yrs)    Types: Cigarettes    Start date: 01/25/1964    Quit date: 01/25/1971    Years since quitting: 52.7   Smokeless tobacco: Never  Vaping Use   Vaping status: Never Used  Substance Use  Topics   Alcohol use: Yes    Alcohol/week: 14.0 standard drinks of alcohol    Types: 14 Standard drinks or equivalent per week    Comment: two drinks with burbon + water daily    Drug use: No    Family Medical History: Family History  Problem Relation Age of Onset   Coronary artery disease Mother 36   Coronary artery disease Father 11   Cancer - Ovarian Sister    Colon cancer Cousin        x 2   Esophageal cancer Neg Hx    Rectal cancer Neg Hx  Stomach cancer Neg Hx     Physical Examination: @VITALWITHPAIN @  General: Patient is well developed, well nourished, calm, collected, and in no apparent distress. Attention to examination is appropriate.  Psychiatric: Patient is non-anxious.  Head:  Pupils equal, round, and reactive to light.  ENT:  Oral mucosa appears well hydrated.  Neck:   Supple.    Respiratory: Patient is breathing without any difficulty.  Extremities: No edema.  Vascular: Palpable dorsal pedal pulses.  Skin:   On exposed skin, there are no abnormal skin lesions.  NEUROLOGICAL:     Awake, alert, oriented to person, place, and time.  Does have some difficulty finding her words and at times has some slight issues with her memory.   Cranial Nerves: Pupils equal round and reactive to light.  Facial tone is symmetric.  Facial sensation is symmetric.  ROM of spine: Decreased range of motion in her cervical spine with pain also present with range of motion.  Strength: Side Biceps Triceps Deltoid Interossei Grip Wrist Ext. Wrist Flex.  R 5 5 5  4+ 5 5 5   L 5 5 4+ 4+ 5 4 5     Reflexes are 2+ and symmetric at the biceps, triceps, brachioradialis  Hoffman's is absent.  Bilateral upper and lower extremity sensation is intact to light touch.   With exception of numbness in her fingertips. Patient's gait is not tandem.  Shuffle like in nature.  Unsure if this is due to difficulty with her head placement looking ahead on the floor.  Medical Decision  Making  Imaging: COMPARISON: MRI of cervical spine 01/22/2020   CLINICAL HISTORY: Neck pain. Posterior neck pains; Pain radiating into Left arm with tingle down left arm.   FINDINGS:   BONES: No acute fracture. No evidence of traumatic malalignment. Chronic retrolisthesis of C3 on C4 and anterolisthesis of C6 and C7.   DISCS AND DEGENERATIVE CHANGES: Multilevel spondylosis and disc space height loss greatest at C3-C4 and C4-C5 where it is moderate to advanced. Multilevel advanced facet arthropathy.   SOFT TISSUES: Normal prevertebral soft tissues. The visualized lungs appear clear.   IMPRESSION: 1. No acute fracture or evidence of traumatic malalignment. 2. Chronic retrolisthesis of C3 on C4 and anterolisthesis of C6 and C7. 3. Multilevel spondylosis and disc space height loss greatest at C3-C4 and C4-C5 where it is moderate to advanced. 4. Multilevel advanced facet arthropathy.  EXAM: MRI CERVICAL SPINE WITHOUT CONTRAST 10/04/2023 01:41:00 PM   TECHNIQUE: Multiplanar multisequence MRI of the cervical spine was performed.   COMPARISON: 01/22/2020   CLINICAL HISTORY: Cervical radiculopathy, no red flags. Pt c/o severe neck pain   FINDINGS:   BONES AND ALIGNMENT: Slight degenerative anterolisthesis at C2-C3 is stable. Spiral retrolisthesis at C4-C5 and C5-C6 has progressed. Grade 1 anterolisthesis at C6-C7 and C7-T1 has progressed slightly.   SPINAL CORD: Normal spinal cord size. No abnormal spinal cord signal.   SOFT TISSUES: No paraspinal mass.   C2-C3: Uncovertebral and facet hypertrophy demonstrate slight progression with moderate left and mild right foraminal stenosis.   C3-C4: A progressed rightward disc osteophyte complex is present. It effaces the ventral CSF on the right. Progressive moderate foraminal narrowing is worse on the right.   C4-C5: A broad-based disc osteophyte complex is present. Moderate foraminal stenosis bilaterally is stable,  right greater than left.   C5-C6: A rightward disc osteophyte complex is present. Moderate foraminal stenosis, right greater than left, is stable.   C6-C7: Uncovertebral and facet hypertrophy is similar to prior study. No  focal stenosis is present.   C7-T1: Uncovertebral broad-based disc protrusion   IMPRESSION: 1. Progression of spiral retrolisthesis at C4-5 and C5-6, and grade 1 anterolisthesis at C6-7 and C7-T1. 2. Progression of uncovertebral and facet hypertrophy at C2-3 with moderate left and mild right foraminal stenosis. 3. Progression of rightward disc osteophyte complex at C3-4 with effacement of the ventral CSF on the right and progressive moderate foraminal narrowing, worse on the right. 4. Stable moderate foraminal stenosis bilaterally at C4-5, right greater than left. 5. Stable moderate foraminal stenosis at C5-6, right greater than left.   Electronically signed by: Lonni Necessary MD 10/05/2023 01:34 PM EDT RP Workstation: [id number]       I have personally reviewed the images and agree with the above interpretation.  Assessment and Plan: Ms. Larsson is a pleasant 78 y.o. female with acute on chronic neck pain x 3 months with radiation primarily to left upper extremity and in between bilateral shoulder blades.  He is having pain with range of motion and she also feels as though her gait has changed.  Some slight weakness seen on examination.  MRI was reviewed at length.  Does have retrolisthesis from C4-6 and then grade 1 anterolisthesis from C6-T1.  She does have significant stenosis extending from C2-5.  This very well could be associated with her neck pain.  Unfortunately had a bad reaction to oxycodone .  Plan includes the following:  -Known osteoporosis.  On Prolia .  For potential surgical planning, would like patient to undergo DEXA scan. - Would like patient to start physical therapy.  She lives at [place].  Referral placed for here. - Plan to try  Celebrex  for her pain. - In addition, patient struggled with word finding, and endorses new headaches, memory loss.  Plan for brain MRI which has been ordered.  Thank you for involving me in the care of this patient.   I spent a total of 45 minutes in both face-to-face and non-face-to-face activities for this visit on the date of this encounterincluding preparing to see the patient, obtaining and reviewing separately obtained history, performing medically appropriate examination, counseling the patient and their family, ordering additional medications and tests, documenting clinical information, independently interpreting results, coordination of care.   Lyle Decamp, PA-C Dept. of Neurosurgery

## 2023-10-09 ENCOUNTER — Ambulatory Visit: Payer: Self-pay

## 2023-10-09 NOTE — Progress Notes (Signed)
 Patient called.  Patient aware.  I have called and informed the patient  "Her labs look good.".  Pt aware.

## 2023-10-10 ENCOUNTER — Ambulatory Visit (INDEPENDENT_AMBULATORY_CARE_PROVIDER_SITE_OTHER): Admitting: Physician Assistant

## 2023-10-10 ENCOUNTER — Encounter: Payer: Self-pay | Admitting: Physician Assistant

## 2023-10-10 VITALS — BP 122/74 | Ht <= 58 in | Wt 96.0 lb

## 2023-10-10 DIAGNOSIS — M542 Cervicalgia: Secondary | ICD-10-CM

## 2023-10-10 DIAGNOSIS — R413 Other amnesia: Secondary | ICD-10-CM

## 2023-10-10 DIAGNOSIS — R4701 Aphasia: Secondary | ICD-10-CM

## 2023-10-10 DIAGNOSIS — R519 Headache, unspecified: Secondary | ICD-10-CM

## 2023-10-10 DIAGNOSIS — M810 Age-related osteoporosis without current pathological fracture: Secondary | ICD-10-CM | POA: Diagnosis not present

## 2023-10-10 MED ORDER — CELECOXIB 200 MG PO CAPS: 200.0000 mg | ORAL_CAPSULE | Freq: Every day | ORAL | 2 refills | Status: DC

## 2023-10-12 ENCOUNTER — Ambulatory Visit (INDEPENDENT_AMBULATORY_CARE_PROVIDER_SITE_OTHER): Admitting: Cardiovascular Disease

## 2023-10-12 ENCOUNTER — Encounter (HOSPITAL_BASED_OUTPATIENT_CLINIC_OR_DEPARTMENT_OTHER): Payer: Self-pay | Admitting: Cardiovascular Disease

## 2023-10-12 VITALS — BP 118/70 | HR 88 | Ht <= 58 in | Wt 96.9 lb

## 2023-10-12 DIAGNOSIS — I700 Atherosclerosis of aorta: Secondary | ICD-10-CM | POA: Diagnosis not present

## 2023-10-12 DIAGNOSIS — Z5181 Encounter for therapeutic drug level monitoring: Secondary | ICD-10-CM | POA: Diagnosis not present

## 2023-10-12 DIAGNOSIS — I6529 Occlusion and stenosis of unspecified carotid artery: Secondary | ICD-10-CM

## 2023-10-12 DIAGNOSIS — E782 Mixed hyperlipidemia: Secondary | ICD-10-CM | POA: Diagnosis not present

## 2023-10-12 DIAGNOSIS — I341 Nonrheumatic mitral (valve) prolapse: Secondary | ICD-10-CM | POA: Diagnosis not present

## 2023-10-12 DIAGNOSIS — E785 Hyperlipidemia, unspecified: Secondary | ICD-10-CM | POA: Diagnosis not present

## 2023-10-12 DIAGNOSIS — I10 Essential (primary) hypertension: Secondary | ICD-10-CM

## 2023-10-12 DIAGNOSIS — I2510 Atherosclerotic heart disease of native coronary artery without angina pectoris: Secondary | ICD-10-CM

## 2023-10-12 NOTE — Progress Notes (Signed)
 Cardiology Office Note:  .   Date:  10/22/2023  ID:  Amanda Cannon, DOB Jan 03, 1946, MRN [id number] PCP: Fleeta Valeria Mayo, MD  [facility] Providers Cardiologist:  Annabella Scarce, MD    History of Present Illness: .    Amanda Cannon is a 78 y.o. female with a hx of minimal non-obstructive CAD, hypertension, polymyalgia rheumatica, hyperlipidemia, Prinzmetal's Angina, <39% bilateral carotid obstruction 2018.  During her last visit with Dr. Claudene 10/2021 she had been feeling well other than occasional palpitations. She had been staying active with 30 minutes of moderate physical activity 5 days a week.   She had a bilateral carotid duplex 10/2021 which revealed 1-39% stenosis in the right and left ICA, Antegrade LVA and atypical antegrade RVA.  At her visit 04/2022 she struggled with GERD and wanted to ensure it wasn't her heart.  She had a coronary CT-A 12/2022 with minimal (<25%) LAD and LCX stenosis.  CAC 274, 72nd percentile.   Discussed the use of AI scribe software for clinical note transcription with the patient, who gave verbal consent to proceed.  History of Present Illness [name] has a history of atherosclerosis, identified following a CT scan of her chest in December 2025 due to chest pain, which revealed minimal blockage in her heart arteries. She is currently on inclisiran to manage her cholesterol levels, with her LDL recently recorded at 75 mg/dL. Her total cholesterol and triglycerides were noted to be high in a recent test, but it is unclear if the test was conducted while fasting. She has been prescribed Zetia  due to her family history of atherosclerosis, as both her parents died from the condition.  She experiences palpitations every two to three days, described as a 'mild flutter' that is bothersome and sometimes causes lightheadedness. Her father experienced similar symptoms. Her blood pressure is consistently low, and she is on medications including  amlodipine , baby aspirin, and Lasix , which she takes daily.  She is experiencing difficulties with speech and has sought evaluation from a neurosurgeon in [place]. Her speech has become harder, and she is concerned about potential brain issues. She also experiences pain in her neck and joints, but not in her spine, and has been diagnosed with disc issues.   ROS:  As per HPI  Studies Reviewed: .       Echo 01/04/23: IMPRESSION: 1. Coronary calcium  score of 274 . This was 72nd percentile for age-, sex, and race-matched controls.   2. Total plaque volume (TPV) 265 mm3 which is 41st percentile for age- and sex-matched controls (calcified plaque 60 mm3; non-calcified plaque 205 mm3 (low attenuation plaque 3 mm3)). TPV is severe.   3.  Normal coronary origin with left dominance.   4. CAD-RADS = 1 minimal calcified plaque (0-24%) in the LAD and LCX.   5. Small PFO cannot be ruled out.   6. Aortic atherosclerosis.   7. Mitral annular calcification.   RECOMMENDATIONS:   Consider non-atherosclerotic causes of chest pain. Consider preventive therapy and risk factor modification.   Carotid Doppler 11/16/21: Summary:  Right Carotid: Velocities in the right ICA are consistent with a 1-39%  stenosis.   Left Carotid: Velocities in the left ICA are consistent with a 1-39%  stenosis.   Vertebrals: Left vertebral artery demonstrates antegrade flow. Atypical               antegrade flow in the right vertebral artery.  Subclavians: Normal flow hemodynamics were seen in bilateral subclavian  arteries.    Risk Assessment/Calculations:         Physical Exam:   VS:  BP 118/70 (BP Location: Left Arm, Patient Position: Sitting)   Pulse 88   Ht 4' 8 (1.422 m)   Wt 96 lb 14.4 oz (44 kg)   SpO2 97%   BMI 21.72 kg/m  , BMI Body mass index is 21.72 kg/m. GENERAL:  Well appearing HEENT: Pupils equal round and reactive, fundi not visualized, oral mucosa  unremarkable NECK:  No jugular venous distention, waveform within normal limits, carotid upstroke brisk and symmetric, no bruits, no thyromegaly LYMPHATICS:  No cervical adenopathy LUNGS:  Clear to auscultation bilaterally HEART:  RRR.  PMI not displaced or sustained,S1 and S2 within normal limits, no S3, no S4, no clicks, no rubs, no murmurs ABD:  Flat, positive bowel sounds normal in frequency in pitch, no bruits, no rebound, no guarding, no midline pulsatile mass, no hepatomegaly, no splenomegaly EXT:  2 plus pulses throughout, no edema, no cyanosis no clubbing SKIN:  No rashes no nodules NEURO:  Cranial nerves II through XII grossly intact, motor grossly intact throughout PSYCH:  Cognitively intact, oriented to person place and time   ASSESSMENT AND PLAN: .    Assessment & Plan # Atherosclerotic cardiovascular disease with minimal coronary and carotid involvement Minimal coronary and carotid atherosclerosis with reasonably well-managed LDL at 75 mg/dL. Elevated total cholesterol and triglycerides likely due to non-fasting. High HDL is beneficial. No symptoms from mild carotid plaque. - Continue inclisiran for cholesterol management. - Recheck fasting lipid panel in three months.  #  Hyperlipidemia Hyperlipidemia managed with inclisiran. LDL at 75 mg/dL, close to target. High HDL beneficial. She would like to avoid additional medications if possible.   - Recheck fasting lipid panel in three months. - Discuss potential continuation of Zetia  after re-evaluation of lipid levels.  Start if LDL remains >70.  - Keep working on diet and exercise.  # Palpitations Intermittent mild palpitations with lightheadedness. Stable blood pressure. Family history noted. She prefers to avoid further testing. - Offer Holter monitor if she decides to pursue further evaluation.  # Mitral annular calcification Mild calcification with no impact on mitral valve function or symptoms. - Continue routine  monitoring for symptoms related to mitral valve function.         Dispo: f/u in 6 months  Signed, Annabella Scarce, MD

## 2023-10-12 NOTE — Patient Instructions (Signed)
 Medication Instructions:  Your physician recommends that you continue on your current medications as directed. Please refer to the Current Medication list given to you today.  *If you need a refill on your cardiac medications before your next appointment, please call your pharmacy*  Lab Work: FASTING LP/CMET IN 3 MONTHS   If you have labs (blood work) drawn today and your tests are completely normal, you will receive your results only by: MyChart Message (if you have MyChart) OR A paper copy in the mail If you have any lab test that is abnormal or we need to change your treatment, we will call you to review the results.  Testing/Procedures: NONE  Follow-Up: At [facility], you and your health needs are our priority.  As part of our continuing mission to provide you with exceptional heart care, our providers are all part of one team.  This team includes your primary Cardiologist (physician) and Advanced Practice Providers or APPs (Physician Assistants and Nurse Practitioners) who all work together to provide you with the care you need, when you need it.  Your next appointment:   6 month(s)  Provider:   Annabella Scarce, MD, Rosaline Bane, NP, or Reche Finder, NP     We recommend signing up for the patient portal called MyChart.  Sign up information is provided on this After Visit Summary.  MyChart is used to connect with patients for Virtual Visits (Telemedicine).  Patients are able to view lab/test results, encounter notes, upcoming appointments, etc.  Non-urgent messages can be sent to your provider as well.   To learn more about what you can do with MyChart, go to [email or web address].

## 2023-10-16 ENCOUNTER — Telehealth: Payer: Self-pay | Admitting: Physician Assistant

## 2023-10-16 ENCOUNTER — Ambulatory Visit: Payer: Self-pay

## 2023-10-16 NOTE — Telephone Encounter (Signed)
 Patient called to let our office know she she is not due for a bone density until 04/07/2024. Do you still want her to have this completed? Her insurance may not pay for it prior to that date. Please advise.

## 2023-10-16 NOTE — Progress Notes (Signed)
 Patient called.  Patient aware.  I have called and informed the patient that Dr. Fleeta Finger stated  Her neck xray shows no fracture.  She has advanced arthritis of her neck.  She needs to see neurosurgery. ,  Pt aware, stated she is seeing a Neurosurgery office in [place] with a PA.

## 2023-10-17 DIAGNOSIS — M4802 Spinal stenosis, cervical region: Secondary | ICD-10-CM | POA: Diagnosis not present

## 2023-10-17 DIAGNOSIS — Z23 Encounter for immunization: Secondary | ICD-10-CM | POA: Diagnosis not present

## 2023-10-17 DIAGNOSIS — M542 Cervicalgia: Secondary | ICD-10-CM | POA: Diagnosis not present

## 2023-10-17 NOTE — Telephone Encounter (Signed)
 Insurance should approve the DEXA scan. They normally let them get one, once a year.

## 2023-10-17 NOTE — Telephone Encounter (Signed)
 I've never had to get these approved, I just send the order and they get scheduled. She would need to call her insurance company to verify what her plan allows.

## 2023-10-17 NOTE — Telephone Encounter (Signed)
 Patient notified and expressed understanding.

## 2023-10-17 NOTE — Telephone Encounter (Signed)
 Patient called our office back and states that she was advised by the breast center in [place] that Bone Density's are every two years for insurance to pay for them. She states that a letter of reasoning would be needed to complete one sooner than the two years. She is asking that you contact their office for clarification for the order.

## 2023-10-18 NOTE — Telephone Encounter (Signed)
 DEXA order faxed to [facility].

## 2023-10-19 DIAGNOSIS — M8588 Other specified disorders of bone density and structure, other site: Secondary | ICD-10-CM | POA: Diagnosis not present

## 2023-10-20 ENCOUNTER — Other Ambulatory Visit: Payer: Self-pay | Admitting: Internal Medicine

## 2023-10-22 ENCOUNTER — Ambulatory Visit
Admission: RE | Admit: 2023-10-22 | Discharge: 2023-10-22 | Disposition: A | Discharge status: Home / Self Care | Source: Ambulatory Visit | Attending: Physician Assistant | Admitting: Physician Assistant

## 2023-10-22 DIAGNOSIS — R519 Headache, unspecified: Secondary | ICD-10-CM

## 2023-10-22 DIAGNOSIS — R413 Other amnesia: Secondary | ICD-10-CM

## 2023-10-22 DIAGNOSIS — R4701 Aphasia: Secondary | ICD-10-CM

## 2023-10-22 DIAGNOSIS — R410 Disorientation, unspecified: Secondary | ICD-10-CM | POA: Diagnosis not present

## 2023-10-23 ENCOUNTER — Other Ambulatory Visit: Payer: Self-pay

## 2023-10-23 ENCOUNTER — Emergency Department

## 2023-10-23 ENCOUNTER — Emergency Department
Admission: EM | Admit: 2023-10-23 | Discharge: 2023-10-23 | Disposition: A | Discharge status: Home / Self Care | Attending: Emergency Medicine | Admitting: Emergency Medicine

## 2023-10-23 ENCOUNTER — Telehealth: Payer: Self-pay | Admitting: Physician Assistant

## 2023-10-23 DIAGNOSIS — M542 Cervicalgia: Secondary | ICD-10-CM | POA: Diagnosis not present

## 2023-10-23 DIAGNOSIS — E785 Hyperlipidemia, unspecified: Secondary | ICD-10-CM | POA: Insufficient documentation

## 2023-10-23 DIAGNOSIS — M353 Polymyalgia rheumatica: Secondary | ICD-10-CM | POA: Insufficient documentation

## 2023-10-23 DIAGNOSIS — I10 Essential (primary) hypertension: Secondary | ICD-10-CM | POA: Diagnosis not present

## 2023-10-23 DIAGNOSIS — R410 Disorientation, unspecified: Secondary | ICD-10-CM | POA: Diagnosis not present

## 2023-10-23 DIAGNOSIS — R519 Headache, unspecified: Secondary | ICD-10-CM | POA: Insufficient documentation

## 2023-10-23 DIAGNOSIS — R000 Tachycardia, unspecified: Secondary | ICD-10-CM | POA: Insufficient documentation

## 2023-10-23 DIAGNOSIS — I2510 Atherosclerotic heart disease of native coronary artery without angina pectoris: Secondary | ICD-10-CM | POA: Insufficient documentation

## 2023-10-23 DIAGNOSIS — R930 Abnormal findings on diagnostic imaging of skull and head, not elsewhere classified: Secondary | ICD-10-CM | POA: Diagnosis not present

## 2023-10-23 DIAGNOSIS — I6529 Occlusion and stenosis of unspecified carotid artery: Secondary | ICD-10-CM | POA: Diagnosis not present

## 2023-10-23 DIAGNOSIS — R413 Other amnesia: Secondary | ICD-10-CM | POA: Diagnosis not present

## 2023-10-23 LAB — BASIC METABOLIC PANEL WITH GFR
Anion gap: 14 (ref 5–15)
BUN: 13 mg/dL (ref 8–23)
CO2: 23 mmol/L (ref 22–32)
Calcium: 9.9 mg/dL (ref 8.9–10.3)
Chloride: 97 mmol/L — ABNORMAL LOW (ref 98–111)
Creatinine, Ser: 0.89 mg/dL (ref 0.44–1.00)
GFR, Estimated: >60 mL/min (ref >60)
Glucose, Bld: 112 mg/dL — ABNORMAL HIGH (ref 70–99)
Potassium: 3.8 mmol/L (ref 3.5–5.1)
Sodium: 134 mmol/L — ABNORMAL LOW (ref 135–145)

## 2023-10-23 LAB — CBC WITH DIFFERENTIAL/PLATELET
Abs Immature Granulocytes: 0.05 K/uL (ref 0.00–0.07)
Basophils Absolute: 0 K/uL (ref 0.0–0.1)
Basophils Relative: 0 %
Eosinophils Absolute: 0.1 K/uL (ref 0.0–0.5)
Eosinophils Relative: 1 %
HCT: 37.3 % (ref 36.0–46.0)
Hemoglobin: 12.9 g/dL (ref 12.0–15.0)
Immature Granulocytes: 1 %
Lymphocytes Relative: 15 %
Lymphs Abs: 1.5 K/uL (ref 0.7–4.0)
MCH: 33.2 pg (ref 26.0–34.0)
MCHC: 34.6 g/dL (ref 30.0–36.0)
MCV: 96.1 fL (ref 80.0–100.0)
Monocytes Absolute: 1.2 K/uL — ABNORMAL HIGH (ref 0.1–1.0)
Monocytes Relative: 12 %
Neutro Abs: 7 K/uL (ref 1.7–7.7)
Neutrophils Relative %: 71 %
Platelets: 364 K/uL (ref 150–400)
RBC: 3.88 MIL/uL (ref 3.87–5.11)
RDW: 13.2 % (ref 11.5–15.5)
WBC: 9.8 K/uL (ref 4.0–10.5)
nRBC: 0 % (ref 0.0–0.2)

## 2023-10-23 LAB — PROTIME-INR
INR: 0.9 (ref 0.8–1.2)
Prothrombin Time: 13.1 s (ref 11.4–15.2)

## 2023-10-23 LAB — APTT: aPTT: 24 s (ref 24–36)

## 2023-10-23 LAB — SEDIMENTATION RATE: Sed Rate: 62 mm/h — ABNORMAL HIGH (ref 0–30)

## 2023-10-23 LAB — C-REACTIVE PROTEIN: CRP: <0.5 mg/dL (ref <1.0)

## 2023-10-23 MED ORDER — IOHEXOL 350 MG/ML SOLN
75.0000 mL | Freq: Once | INTRAVENOUS | Status: AC | PRN
Start: 2023-10-23 — End: 2023-10-23
  Administered 2023-10-23: 75 mL via INTRAVENOUS

## 2023-10-23 MED ORDER — SODIUM CHLORIDE 0.9 % IV BOLUS: 500.0000 mL | Freq: Once | INTRAVENOUS | Status: DC

## 2023-10-23 NOTE — ED Notes (Signed)
 Pt updated on status. Denies any needs.

## 2023-10-23 NOTE — ED Notes (Addendum)
 Went to discharge pt and pt was standing beside her daughter who was holding her up. Pt advised she was really dizzy after standing up. Checked orthostatic vitals and pt was positive for same. Rechecking orthostatic vital signs after fluid bolus per Dr. Arlander

## 2023-10-23 NOTE — ED Provider Notes (Signed)
 [facility] Provider Note    Event Date/Time   First MD Initiated Contact with Patient 10/23/23 1417     (approximate)   History   abnormal MRI   HPI  Amanda Cannon is a 77 y.o. female past medical history significant for nonobstructive coronary artery disease, hypertension, polymyalgia rheumatica, hyperlipidemia, Prinzmetal angina, who presents to the emergency department with abnormal MRI result.  Patient has been having some neck pain, some mild gait instability and word finding difficulties over the past couple of months.  Was seen by neurosurgery and they ordered an MRI.  MRI today of her brain concerned for possible venous thrombosis and was told to come into the emergency department for further workup.  Denies any falls or head trauma.  Not on anticoagulation.  No history of CVA.  Only endorsing a mild headache.  States that her headache is also with her neck pain.  Denies any change in vision.     Physical Exam   Triage Vital Signs: ED Triage Vitals  Encounter Vitals Group     BP 10/23/23 1404 (!) 141/85     Girls Systolic BP Percentile --      Girls Diastolic BP Percentile --      Boys Systolic BP Percentile --      Boys Diastolic BP Percentile --      Pulse Rate 10/23/23 1404 (!) 110     Resp 10/23/23 1404 18     Temp 10/23/23 1404 98.6 F (37 C)     Temp src --      SpO2 10/23/23 1404 100 %     Weight --      Height --      Head Circumference --      Peak Flow --      Pain Score 10/23/23 1403 5     Pain Loc --      Pain Education --      Exclude from Growth Chart --     Most recent vital signs: Vitals:   10/23/23 1404  BP: (!) 141/85  Pulse: (!) 110  Resp: 18  Temp: 98.6 F (37 C)  SpO2: 100%    Physical Exam Constitutional:      Appearance: She is well-developed.  HENT:     Head: Atraumatic.     Mouth/Throat:     Mouth: Mucous membranes are moist.  Eyes:     Extraocular Movements: Extraocular movements intact.      Conjunctiva/sclera: Conjunctivae normal.     Pupils: Pupils are equal, round, and reactive to light.  Cardiovascular:     Rate and Rhythm: Regular rhythm. Tachycardia present.  Pulmonary:     Effort: No respiratory distress.  Abdominal:     General: There is no distension.     Tenderness: There is no abdominal tenderness.  Musculoskeletal:        General: Normal range of motion.     Cervical back: Normal range of motion.  Skin:    General: Skin is warm.     Capillary Refill: Capillary refill takes less than 2 seconds.  Neurological:     Mental Status: She is alert. Mental status is at baseline.     GCS: GCS eye subscore is 4. GCS verbal subscore is 5. GCS motor subscore is 6.     Cranial Nerves: Cranial nerves 2-12 are intact.     Sensory: Sensation is intact.     Motor: Motor function is intact.  IMPRESSION / MDM / ASSESSMENT AND PLAN / ED COURSE  I reviewed the triage vital signs and the nursing notes.  Differential diagnosis including cerebral venous thrombosis, cervical stenosis, primary headache, electrolyte abnormality, dehydration, giant cell arteritis Although Lower Suspicion Given No Change in Vision.  RADIOLOGY CT venogram  LABS (all labs ordered are listed, but only abnormal results are displayed) Labs interpreted as -    Labs Reviewed  CBC WITH DIFFERENTIAL/PLATELET  BASIC METABOLIC PANEL WITH GFR  PROTIME-INR  APTT     MDM    MRI of the brain with findings concerning for new venous sinus thrombosis.  They had discussed with neurology who recommended going to the emergency department to undergo a CT venogram for further recommendations.  If CTV is positive plan for admission to the hospitalist and anticoagulation with neurology consultation.  If negative will reach out for neurology for further recommendations.  Care transferred to incoming provider.  Lab work and CT scan pending.   PROCEDURES:  Critical Care performed:  No  Procedures  Patient's presentation is most consistent with acute presentation with potential threat to life or bodily function.   MEDICATIONS ORDERED IN ED: Medications - No data to display  FINAL CLINICAL IMPRESSION(S) / ED DIAGNOSES   Final diagnoses:  Nonintractable headache, unspecified chronicity pattern, unspecified headache type     Rx / DC Orders   ED Discharge Orders     None        Note:  This document was prepared using Dragon voice recognition software and may include unintentional dictation errors.   Suzanne Kirsch, MD 10/23/23 1450

## 2023-10-23 NOTE — ED Triage Notes (Addendum)
 Pt comes with c/o abnormal MRI. Pt has it done yesterday. Pt was called today to come to ED for further evaluation.   IMPRESSION: 1. Abnormal left internal jugular vein and sigmoid sinus flow void, new since 01/22/20, concerning for venous sinus thrombosis. Further imaging with CT venogram or MRV brain WITH CONTRAST may be helpful. 2. Multifocal hyperintense T2-weighted signal within the cerebral white matter, most commonly due to chronic small vessel disease. 3. Mild volume loss, unchanged.

## 2023-10-23 NOTE — Telephone Encounter (Signed)
 Radiology called and wanted you to be aware of the abnormality in the mri, impression 1. Called in as a stat.

## 2023-10-23 NOTE — Telephone Encounter (Signed)
 Called patient and her daughter regarding abnormal MRI of her brain that was completed yesterday.  This showed a new abnormal left internal jugular vein and sigmoid sinus flow-void concerning for a venous sinus thrombosis.  Original MRI was ordered secondary to headaches and new memory loss.    She was instructed to go to the emergency department as soon as possible and have recommended CT venogram.  Patient and her family are in agreement.  They do plan to go to the emergency department as soon as possible.

## 2023-10-23 NOTE — Telephone Encounter (Signed)
 MRI Brain IMPRESSION: 1. Abnormal left internal jugular vein and sigmoid sinus flow void, new since 01/22/20, concerning for venous sinus thrombosis. Further imaging with CT venogram or MRV brain WITH CONTRAST may be helpful. 2. Multifocal hyperintense T2-weighted signal within the cerebral white matter, most commonly due to chronic small vessel disease. 3. Mild volume loss, unchanged.   MRI brain was ordered for patient 2 weeks ago secondary to new headaches and forgetfulness.  Concern for new venous sinus thrombosis present.  Discussed case with neurology.  Plan for patient to go to the emergency department and undergo CT venogram would be the recommendation.  Per neurology, the inpatient neurology team will need to be consulted if the CT venogram is positive for dural venous sinus thrombus and anticoagulation would need to be completed.

## 2023-10-23 NOTE — ED Notes (Signed)
 Pt had normal orthostatic vitals after 500 NS bolus

## 2023-10-23 NOTE — ED Provider Notes (Signed)
 CT venogram is negative, patient will follow-up with PCP   Arlander [place], MD 10/23/23 1810

## 2023-10-23 NOTE — Discharge Instructions (Signed)
 Your CT venogram was negative, please continue to follow-up with your PCP

## 2023-10-24 DIAGNOSIS — M542 Cervicalgia: Secondary | ICD-10-CM | POA: Diagnosis not present

## 2023-10-24 DIAGNOSIS — M4802 Spinal stenosis, cervical region: Secondary | ICD-10-CM | POA: Diagnosis not present

## 2023-10-27 ENCOUNTER — Other Ambulatory Visit: Payer: Self-pay | Admitting: Internal Medicine

## 2023-10-27 DIAGNOSIS — M542 Cervicalgia: Secondary | ICD-10-CM | POA: Diagnosis not present

## 2023-10-27 DIAGNOSIS — M4802 Spinal stenosis, cervical region: Secondary | ICD-10-CM | POA: Diagnosis not present

## 2023-10-30 ENCOUNTER — Telehealth: Payer: Self-pay | Admitting: Physician Assistant

## 2023-10-30 DIAGNOSIS — M4802 Spinal stenosis, cervical region: Secondary | ICD-10-CM | POA: Diagnosis not present

## 2023-10-30 DIAGNOSIS — M542 Cervicalgia: Secondary | ICD-10-CM | POA: Diagnosis not present

## 2023-10-30 NOTE — Telephone Encounter (Signed)
 Where is your pain? Neck How long have you had this pain? 4 months Did something happen that caused this pain? When she sleeps Are you taking medication for this pain? Yes What medication? Aleve How often are you taking this medication? Every Night What is the dose of the medication? Otc Reason for calling: Pt states that they have been doing PT and that the pt is not helping the pain, she is wondering if she still needs to continue PT or move forward with you with next steps?  Patient contact information [phone]

## 2023-11-01 ENCOUNTER — Encounter: Payer: Self-pay | Admitting: Neurosurgery

## 2023-11-01 ENCOUNTER — Ambulatory Visit: Admitting: Neurosurgery

## 2023-11-01 VITALS — BP 122/76 | Ht <= 58 in | Wt 96.0 lb

## 2023-11-01 DIAGNOSIS — M542 Cervicalgia: Secondary | ICD-10-CM

## 2023-11-01 DIAGNOSIS — M47812 Spondylosis without myelopathy or radiculopathy, cervical region: Secondary | ICD-10-CM

## 2023-11-01 DIAGNOSIS — R413 Other amnesia: Secondary | ICD-10-CM | POA: Diagnosis not present

## 2023-11-01 NOTE — Progress Notes (Unsigned)
 Referring Physician:  Fleeta Valeria Mayo, MD [address] [place],  [place] [address]  Primary Physician:  Fleeta Valeria Mayo, MD  History of Present Illness: 11/02/2023 Amanda Cannon is here today with a chief complaint of acute on chronic neck pain that radiates into bilateral shoulders x 3 months.  She also complains of limited range of motion and that she has pain when she turns her head, rides in the car, tries to sleep.  She states that a majority of her issues are neck pain.  Stabbing in nature.  She does get some intermittent shooting pains down her arms.  She does feel like she has had a progressive difficulty with her cognition.  She has had some hallucinations, some word finding difficulties, paucity of her speech, she has been having a shuffling gait and worsened memory.  She has not seen neurology for any of these issues yet.  Weakness: none Bowel/Bladder Dysfunction: none  Conservative measures:  Physical therapy:  has not participated in Multimodal medical therapy including regular antiinflammatories:  cymbalta , gabapentin , meloxicam , naproxen, oxycodone  Injections:   08/25/23: Left C7-T1 ESI 08/02/23: Left C7-T1 ESI  Past Surgery: no spinal surgeries     The symptoms are causing a significant impact on the patient's life.   Review of Systems:  A 10 point review of systems is negative, except for the pertinent positives and negatives detailed in the HPI.  Past Medical History: Past Medical History:  Diagnosis Date   Alcohol use    Anemia    Anxiety    Asthma    Basal cell carcinoma    forehead   Breast cancer (HCC) 1998   left    Colon polyps    GERD (gastroesophageal reflux disease)    Glaucoma    Hyperlipidemia    Hypertension    Hyponatremia    remote, mild   Mitral valve prolapse    Osteoarthritis    sees rheumatologist   Osteoporosis    sees rheumatologist   Personal history of radiation therapy 1998   PMR (polymyalgia rheumatica)    sees  rheumatologist   Pneumonia    Prinzmetal angina    White matter changes    ? ischemic sm vs per pt    Past Surgical History: Past Surgical History:  Procedure Laterality Date   ABDOMINAL HYSTERECTOMY     BREAST LUMPECTOMY Left 1998   cancer   CESAREAN SECTION     x 4   COLONOSCOPY  07/2012   in [facility]   HERNIA REPAIR  2005   abdominal hernia repair   HYSTEROTOMY     LYMPH NODE DISSECTION Left    POLYPECTOMY      Allergies: Allergies as of 11/01/2023 - Review Complete 11/01/2023  Allergen Reaction Noted   Acetaminophen Cough 07/15/2015   Beta adrenergic blockers  12/15/2020   Tetrahydrozoline-zn sulfate  09/21/2022   Erythromycin  05/06/2016   Erythromycin base Other (See Comments)    Other Other (See Comments) 05/06/2016   Sulfa antibiotics Other (See Comments) 05/06/2016   Turmeric  04/10/2017   Betamethasone Other (See Comments) 11/09/2021   Oxycodone -acetaminophen Nausea And Vomiting and Palpitations 10/10/2023    Medications: Outpatient Encounter Medications as of 11/01/2023  Medication Sig   amLODipine  (NORVASC ) 5 MG tablet TAKE 1 TABLET (5 MG TOTAL) BY MOUTH DAILY.   aspirin 81 MG tablet Take 81 mg by mouth daily.   Cholecalciferol 25 MCG (1000 UT) tablet Take by mouth.   denosumab  (  PROLIA ) 60 MG/ML SOLN injection Inject 60 mg into the skin every 6 (six) months. Administer in upper arm, thigh, or abdomen   dorzolamide (TRUSOPT) 2 % ophthalmic solution Apply to eye.   DULoxetine  (CYMBALTA ) 30 MG capsule TAKE 1 CAPSULE BY MOUTH EVERY DAY   ezetimibe  (ZETIA ) 10 MG tablet Take 1 tablet (10 mg total) by mouth daily.   famotidine  (PEPCID ) 40 MG tablet TAKE 1 TABLET BY MOUTH EVERYDAY AT BEDTIME   furosemide  (LASIX ) 20 MG tablet Take 20 mg by mouth daily.   inclisiran (LEQVIO ) 284 MG/1.5ML SOSY injection Inject 284 mg into the skin once.   latanoprost (XALATAN) 0.005 % ophthalmic solution 1 drop at bedtime.   levothyroxine (SYNTHROID) 25 MCG tablet Take 25 mcg by mouth  daily.   LORazepam (ATIVAN) 1 MG tablet Take 1 mg by mouth at bedtime as needed (spasms).   magnesium  oxide (MAG-OX) 400 MG tablet Take by mouth as needed.   meclizine  (ANTIVERT ) 25 MG tablet TAKE 1 TABLET (25 MG TOTAL) BY MOUTH 2 (TWO) TIMES DAILY AS NEEDED FOR DIZZINESS.   sodium chloride  (OCEAN) 0.65 % SOLN nasal spray Place 1 spray into both nostrils as needed for congestion.   [DISCONTINUED] celecoxib  (CELEBREX ) 200 MG capsule Take 1 capsule (200 mg total) by mouth daily.   [DISCONTINUED] montelukast  (SINGULAIR ) 10 MG tablet TAKE 1 TABLET BY MOUTH EVERY DAY   No facility-administered encounter medications on file as of 11/01/2023.    Social History: Social History   Tobacco Use   Smoking status: Former    Current packs/day: 0.00    Average packs/day: 1 pack/day for 7.0 years (7.0 ttl pk-yrs)    Types: Cigarettes    Start date: 01/25/1964    Quit date: 01/25/1971    Years since quitting: 52.8   Smokeless tobacco: Never  Vaping Use   Vaping status: Never Used  Substance Use Topics   Alcohol use: Yes    Alcohol/week: 14.0 standard drinks of alcohol    Types: 14 Standard drinks or equivalent per week    Comment: two drinks with burbon + water daily    Drug use: No    Family Medical History: Family History  Problem Relation Age of Onset   Coronary artery disease Mother 16   Coronary artery disease Father 53   Cancer - Ovarian Sister    Colon cancer Cousin        x 2   Esophageal cancer Neg Hx    Rectal cancer Neg Hx    Stomach cancer Neg Hx     Physical Examination: NEUROLOGICAL:     Patient does have some word finding difficulties, and often appears to be slightly tangential.  Had multiple instances where we had to repeat information from a conversation what we had immediately prior.   Cranial Nerves: Pupils equal round and reactive to light.  Facial tone is symmetric.  Facial sensation is symmetric.  ROM of spine: Decreased range of motion in her cervical spine with  pain also present with range of motion.  Strength: Side Biceps Triceps Deltoid Interossei Grip Wrist Ext. Wrist Flex.  R 5 5 5  4+ 5 5 5   L 5 5 4+ 4+ 5 4 5     Reflexes are 2+ and symmetric at the biceps, triceps, brachioradialis  Hoffman's is absent.  Bilateral upper and lower extremity sensation is intact to light touch.   With exception of numbness in her fingertips. Patient has a shuffling gait, this is not wide-based and does  not appear to be overly unsteady but does have difficulty lifting her legs rather than shuffling.  Medical Decision Making  Imaging: COMPARISON: MRI of cervical spine 01/22/2020   CLINICAL HISTORY: Neck pain. Posterior neck pains; Pain radiating into Left arm with tingle down left arm.   FINDINGS:   BONES: No acute fracture. No evidence of traumatic malalignment. Chronic retrolisthesis of C3 on C4 and anterolisthesis of C6 and C7.   DISCS AND DEGENERATIVE CHANGES: Multilevel spondylosis and disc space height loss greatest at C3-C4 and C4-C5 where it is moderate to advanced. Multilevel advanced facet arthropathy.   SOFT TISSUES: Normal prevertebral soft tissues. The visualized lungs appear clear.   IMPRESSION: 1. No acute fracture or evidence of traumatic malalignment. 2. Chronic retrolisthesis of C3 on C4 and anterolisthesis of C6 and C7. 3. Multilevel spondylosis and disc space height loss greatest at C3-C4 and C4-C5 where it is moderate to advanced. 4. Multilevel advanced facet arthropathy.  EXAM: MRI CERVICAL SPINE WITHOUT CONTRAST 10/04/2023 01:41:00 PM   TECHNIQUE: Multiplanar multisequence MRI of the cervical spine was performed.   COMPARISON: 01/22/2020   CLINICAL HISTORY: Cervical radiculopathy, no red flags. Pt c/o severe neck pain   FINDINGS:   BONES AND ALIGNMENT: Slight degenerative anterolisthesis at C2-C3 is stable. Spiral retrolisthesis at C4-C5 and C5-C6 has progressed. Grade 1 anterolisthesis at C6-C7 and C7-T1 has  progressed slightly.   SPINAL CORD: Normal spinal cord size. No abnormal spinal cord signal.   SOFT TISSUES: No paraspinal mass.   C2-C3: Uncovertebral and facet hypertrophy demonstrate slight progression with moderate left and mild right foraminal stenosis.   C3-C4: A progressed rightward disc osteophyte complex is present. It effaces the ventral CSF on the right. Progressive moderate foraminal narrowing is worse on the right.   C4-C5: A broad-based disc osteophyte complex is present. Moderate foraminal stenosis bilaterally is stable, right greater than left.   C5-C6: A rightward disc osteophyte complex is present. Moderate foraminal stenosis, right greater than left, is stable.   C6-C7: Uncovertebral and facet hypertrophy is similar to prior study. No focal stenosis is present.   C7-T1: Uncovertebral broad-based disc protrusion   IMPRESSION: 1. Progression of spiral retrolisthesis at C4-5 and C5-6, and grade 1 anterolisthesis at C6-7 and C7-T1. 2. Progression of uncovertebral and facet hypertrophy at C2-3 with moderate left and mild right foraminal stenosis. 3. Progression of rightward disc osteophyte complex at C3-4 with effacement of the ventral CSF on the right and progressive moderate foraminal narrowing, worse on the right. 4. Stable moderate foraminal stenosis bilaterally at C4-5, right greater than left. 5. Stable moderate foraminal stenosis at C5-6, right greater than left.   Electronically signed by: Amanda Necessary MD 10/05/2023 01:34 PM EDT RP Workstation: [id number]       I have personally reviewed the images and agree with the above interpretation.  Assessment and Plan: Amanda Cannon is a pleasant 78 y.o. female with acute on chronic neck pain x 4 months with radiation primarily to left upper extremity and in between bilateral shoulder blades.  She does have multilevel cervical spondylosis.  She has osteoporosis on Prolia .  She is here today to  discuss possible surgical intervention.  She does have significant neck pain.  She states that she has limited radiation into her arms legs or trunk.  She does not have any clinical signs of myelopathy.  There is some concern for cognition changes.  She states and her husband states that she has been having more memory issues, her speech  has changed dramatically and she is having word finding difficulties and confusion, she is also developed a shuffling gait and difficulty following conversations.  I would like to have her see neurology, we did discuss that she has significant amount of spondylosis in her cervical spine, however she has no clinical signs of myelopathy.  She has not had any facet injections or medial branch blocks to see whether or not there are nonsurgical options for her neck pain.  Given the fact that she does not have any progressive deficit from a compression standpoint and does not have any signs of myelopathy or active radiculopathy I feel like she should undergo conservative management for her cervical spine prior to any intervention.  I also feel like she should see neurology for evaluation as she is developing a significant amount of other symptoms not related to her cervical spine.  She was hopeful that a operation on her cervical spine would help with her memory and cognition as well as her word finding difficulties and headaches.  I assured her that a decompression type procedure I would mostly be focused at myelopathy which she does not have, and that the fusion part of the procedure can often help with neck pain but she has not yet had all available conservative measures.  Penne LELON Sharps MD Dept. of Neurosurgery

## 2023-11-02 ENCOUNTER — Other Ambulatory Visit: Payer: Self-pay | Admitting: Internal Medicine

## 2023-11-02 DIAGNOSIS — R413 Other amnesia: Secondary | ICD-10-CM | POA: Insufficient documentation

## 2023-11-03 ENCOUNTER — Encounter: Payer: Self-pay | Admitting: Physician Assistant

## 2023-11-04 NOTE — Progress Notes (Incomplete)
 Assessment/Plan:     Amanda Cannon is a very pleasant 78 y.o. year old RH female with a history of hypertension, hyperlipidemia, history of hyponatremia, cervical radiculopathy, HOH, PMR (sees rheumatologist) anxiety, history of breast cancer, insomnia, hypothyroidism, anemia, GERD, hypothyroidism, multiple allergies, seen today for evaluation of memory loss. MoCA today is 14/30  .  Etiology is unclear, likely multifactorial.  MRI of the brain concerning for venous sinus thrombosis MRV of the brain is pending), chronic small vessel disease and mild volume loss and no significant atrophy is seen. Patient is able to participate on ADLs.  Discussed starting donepezil 5 mg daily with goal of 10 mg daily if tolerated, patient prefers to wait for neuropsych testing prior to make a decision. Mood is anxious.    Memory Impairment of unclear cause, concern for vascular and other etiologies  Neurocognitive testing to further evaluate cognitive concerns and determine other underlying cause of memory changes, including potential contribution from sleep, anxiety, attention, or depression among others  Check B12, B1  Recommend good control of cardiovascular risk factors.   Continue to control mood as per PCP Folllow up in 2 months   Subjective:    The patient is accompanied by her husband   who supplements  the history.    How long did patient have memory difficulties?  For the last 4-5 moths. Started with my neck, my arthritis. She reports word finding difficulties, and unable to take very fluently.  Patient reports some difficulty remembering new information, recent conversations, names. LTM is good. Likes crossword puzzles, other brain stimulating exercises, listening to the 50s music. Repeats oneself?  Endorsed  Disoriented when walking into a room? Denies    Leaving objects in unusual places?  Denies.   Wandering behavior? Denies.   Any personality changes, or depression, anxiety? Has a  history of anxiety. I am less happy, concerned about my memory.   Hallucinations or paranoia?  Denies  Seizures? Denies.    Any sleep changes?  Sleeps well. Denies frequent nightmares or dream reenactment, other REM behavior or sleepwalking   Sleep apnea? Denies.   Any hygiene concerns?  Denies.   Independent of bathing and dressing? Endorsed  Does the patient need help with medications?  Husband is in charge of the daytime meds he likes it, she is in charge of the night medicines   Who is in charge of the finances? Husband is in charge     Any changes in appetite? Semaja is not an eater, eats small portions.     Patient have trouble swallowing?  Denies.   Does the patient cook? Yes.She is an excellent cook-husband says.  Denies forgetting common recipes or kitchen accidents   Any headaches?  The patient had a presentation to the ED on 10/23/2023 with acute non-intractable headache with MRI of the brain negative for acute findings. Chronic pain?  Chronic neck pain radiating to both shoulders for the last 3 months, with limited range of motion, pain when turning her head or riding in the car or trying to sleep.  The pain is stabbing and she has intermittent shooting pains down her arms.  She has taken Cymbalta , gabapentin , meloxicam , naproxen, oxycodone , and has received 2 injections 1 in August and 1 in July today left C7-T1. Following OP specialist. Ambulates with difficulty?  She has been shuffling her feet but doing better-husband says Recent falls or head injuries? Denies.    Vision changes?  Denies any new issues.  Any strokelike symptoms? Denies.   Any tremors? Only when nervous  Any anosmia? Denies.   Any incontinence of urine? Denies.   Any bowel dysfunction? Denies.      Patient lives with her husband History of heavy alcohol intake?  1 bourbon a day, maybe 2 sometimes History of heavy tobacco use? Denies.   Family history of dementia?  Denies    Does patient drive?  yes, denies getting lost.   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     Pertinent labs 10/23/2023: CRP less than 0.5, ESR 62, PT and PTT normal, normal CBC, TSH 4.010, TC 204, TG 213  MRI brain 10/22/2023, with and without contrast personally reviewed personally reviewed:  remarkable for abnormal LIJV sinus flow-void concerning for venous sinus thrombosis, MRV in progress.   C-spine MRI with and without contrast 10/14/2023:  Multilevel moderate foraminal stenosis, and progression of retrolisthesis at C4-C5 and C5-C6, grade 1, anterolisthesis at C6-7 and C7-T1.  Allergies  Allergen Reactions   Acetaminophen Cough    Other Reaction(s): Unknown   Beta Adrenergic Blockers     Other Reaction(s): Other  Cognitive issues  Other Reaction(s): Mental Status Changes   Tetrahydrozoline-Zn Sulfate     Other Reaction(s): Mental Status Changes  Prescription eyedrops   Erythromycin     Stomach cramps  Other Reaction(s): Other (See Comments)  Stomach cramps, Stomach cramps   Erythromycin Base Other (See Comments)    Stomach issues   Other Other (See Comments)    Beta-blockers-patient states it makes her crazy   Sulfa Antibiotics Other (See Comments)    Doesn't recall   Turmeric     Lowers BP   Betamethasone Other (See Comments)    Other reaction(s): Not available   Oxycodone -Acetaminophen Nausea And Vomiting and Palpitations    Insomnia    Current Outpatient Medications  Medication Instructions   amLODipine  (NORVASC ) 5 mg, Oral, Daily   aspirin 81 mg, Daily   Cholecalciferol 25 MCG (1000 UT) tablet Take by mouth.   denosumab  (PROLIA ) 60 mg, Every 6 months   dorzolamide (TRUSOPT) 2 % ophthalmic solution Apply to eye.   DULoxetine  (CYMBALTA ) 30 MG capsule Oral, Daily   ezetimibe  (ZETIA ) 10 mg, Oral, Daily   famotidine  (PEPCID ) 40 MG tablet TAKE 1 TABLET BY MOUTH EVERYDAY AT BEDTIME   furosemide  (LASIX ) 20 mg, Daily   latanoprost (XALATAN) 0.005 % ophthalmic solution 1 drop,  Daily at bedtime   Leqvio  284 mg,  Once   levothyroxine (SYNTHROID) 25 mcg, Daily   LORazepam (ATIVAN) 1 mg, At bedtime PRN   magnesium  oxide (MAG-OX) 400 MG tablet As needed   meclizine  (ANTIVERT ) 25 mg, Oral, 2 times daily PRN   sodium chloride  (OCEAN) 0.65 % SOLN nasal spray 1 spray, Each Nare, As needed     VITALS:   Vitals:   11/06/23 0933  BP: 110/63  Pulse: (!) 115  Resp: 20  SpO2: 97%  Weight: 98 lb (44.5 kg)  Height: 4' 8 (1.422 m)     Physical Exam  :    11/06/2023   12:00 PM  Montreal Cognitive Assessment   Visuospatial/ Executive (0/5) 1  Naming (0/3) 3  Attention: Read list of digits (0/2) 2  Attention: Read list of letters (0/1) 1  Attention: Serial 7 subtraction starting at 100 (0/3) 0  Language: Repeat phrase (0/2) 0  Language : Fluency (0/1) 0  Abstraction (0/2) 1  Delayed Recall (0/5) 1  Orientation (0/6) 5  Total 14  Adjusted Score (based on education) 14       11/09/2017    8:57 AM 11/08/2016    8:55 AM  MMSE - Mini Mental State Exam  Not completed: -- --       HEENT:  Normocephalic, atraumatic.  The superficial temporal arteries are without ropiness or tenderness. Cardiovascular: Regular rate and rhythm. Lungs: Clear to auscultation bilaterally. Neck: There are no carotid bruits noted bilaterally. Orientation:  Alert and oriented to person, and time, not to place. No aphasia or dysarthria. Fund of knowledge is appropriate. Recent and remote memory impaired.  Attention and concentration are reduced.  Able to name objects and repeat phrases.  Delayed recall  1/5 . Cranial nerves: There is good facial symmetry. Extraocular muscles are intact and visual fields are full to confrontational testing. Speech is not fluent but clear, tangential at times. No tongue deviation. Hearing is intact to conversational tone.  Tone: Tone is good throughout. Sensation: Sensation is intact to light touch.  Vibration is intact at the bilateral big toe.   Coordination: The patient has no difficulty with RAM's or FNF bilaterally. Normal finger to nose  Motor: Strength is 5/5 in the bilateral upper and lower extremities. There is no pronator drift. There are no fasciculations noted. DTR's: Deep tendon reflexes are 2/4 bilaterally. Gait and Station: The patient is able to ambulate with some difficulty. Gait is cautious and narrow.  Stride length is shorter,  gait cautious and narrow.        Thank you for allowing us  the opportunity to participate in the care of this nice patient. Please do not hesitate to contact us  for any questions or concerns.   Total time spent on today's visit was 65 minutes dedicated to this patient today, preparing to see patient, examining the patient, ordering tests and/or medications and counseling the patient, documenting clinical information in the EHR or other health record, independently interpreting results and communicating results to the patient/family, discussing treatment and goals, answering patient's questions and coordinating care.  Cc:  Fleeta Valeria Mayo, MD  Camie Sevin 11/06/2023 12:45 PM

## 2023-11-06 ENCOUNTER — Ambulatory Visit (INDEPENDENT_AMBULATORY_CARE_PROVIDER_SITE_OTHER): Admitting: Physician Assistant

## 2023-11-06 ENCOUNTER — Ambulatory Visit

## 2023-11-06 ENCOUNTER — Encounter: Payer: Self-pay | Admitting: Physician Assistant

## 2023-11-06 ENCOUNTER — Other Ambulatory Visit

## 2023-11-06 VITALS — BP 110/63 | HR 115 | Resp 20 | Ht <= 58 in | Wt 98.0 lb

## 2023-11-06 DIAGNOSIS — R413 Other amnesia: Secondary | ICD-10-CM

## 2023-11-06 NOTE — Patient Instructions (Addendum)
 It was a pleasure to see you today at our office.   Recommendations:  Neurocognitive evaluation at our office   Check labs today  [address] Follow up  after the neurocognitive testing    [email or web address]   RECOMMENDATIONS FOR ALL PATIENTS WITH MEMORY PROBLEMS: 1. Continue to exercise (Recommend 30 minutes of walking everyday, or 3 hours every week) 2. Increase social interactions - continue going to [place] and enjoy social gatherings with friends and family 3. Eat healthy, avoid fried foods and eat more fruits and vegetables 4. Maintain adequate blood pressure, blood sugar, and blood cholesterol level. Reducing the risk of stroke and cardiovascular disease also helps promoting better memory. 5. Avoid stressful situations. Live a simple life and avoid aggravations. Organize your time and prepare for the next day in anticipation. 6. Sleep well, avoid any interruptions of sleep and avoid any distractions in the bedroom that may interfere with adequate sleep quality 7. Avoid sugar, avoid sweets as there is a strong link between excessive sugar intake, diabetes, and cognitive impairment We discussed the Mediterranean diet, which has been shown to help patients reduce the risk of progressive memory disorders and reduces cardiovascular risk. This includes eating fish, eat fruits and green leafy vegetables, nuts like almonds and hazelnuts, walnuts, and also use olive oil. Avoid fast foods and fried foods as much as possible. Avoid sweets and sugar as sugar use has been linked to worsening of memory function.  There is always a concern of gradual progression of memory problems. If this is the case, then we may need to adjust level of care according to patient needs. Support, both to the patient and caregiver, should then be put into place.      You have been referred for a neuropsychological evaluation (i.e., evaluation of memory and thinking  abilities). Please bring someone with you to this appointment if possible, as it is helpful for the doctor to hear from both you and another adult who knows you well. Please bring eyeglasses and hearing aids if you wear them.    The evaluation will take approximately 3 hours and has two parts:   The first part is a clinical interview with the neuropsychologist (Dr. Richie or Dr. Gayland). During the interview, the neuropsychologist will speak with you and the individual you brought to the appointment.    The second part of the evaluation is testing with the doctor's technician Neal or Luke). During the testing, the technician will ask you to remember different types of material, solve problems, and answer some questionnaires. Your family member will not be present for this portion of the evaluation.   Please note: We must reserve several hours of the neuropsychologist's time and the psychometrician's time for your evaluation appointment. As such, there is a No-Show fee of $100. If you are unable to attend any of your appointments, please contact our office as soon as possible to reschedule.      DRIVING: Regarding driving, in patients with progressive memory problems, driving will be impaired. We advise to have someone else do the driving if trouble finding directions or if minor accidents are reported. Independent driving assessment is available to determine safety of driving.   If you are interested in the driving assessment, you can contact the following:  [facility] [employer] [facility] in [place] [phone]  [facility] [phone]  [facility] [phone]  [facility] [phone] or [phone]   FALL PRECAUTIONS: Be cautious when walking. Scan the area for obstacles that  may increase the risk of trips and falls. When getting up in the mornings, sit up at the edge of the bed for a few minutes before getting out of bed. Consider elevating the bed at  the head end to avoid drop of blood pressure when getting up. Walk always in a well-lit room (use night lights in the walls). Avoid area rugs or power cords from appliances in the middle of the walkways. Use a walker or a cane if necessary and consider physical therapy for balance exercise. Get your eyesight checked regularly.  FINANCIAL OVERSIGHT: Supervision, especially oversight when making financial decisions or transactions is also recommended.  HOME SAFETY: Consider the safety of the kitchen when operating appliances like stoves, microwave oven, and blender. Consider having supervision and share cooking responsibilities until no longer able to participate in those. Accidents with firearms and other hazards in the house should be identified and addressed as well.   ABILITY TO BE LEFT ALONE: If patient is unable to contact 911 operator, consider using LifeLine, or when the need is there, arrange for someone to stay with patients. Smoking is a fire hazard, consider supervision or cessation. Risk of wandering should be assessed by caregiver and if detected at any point, supervision and safe proof recommendations should be instituted.  MEDICATION SUPERVISION: Inability to self-administer medication needs to be constantly addressed. Implement a mechanism to ensure safe administration of the medications.      Mediterranean Diet A Mediterranean diet refers to food and lifestyle choices that are based on the traditions of countries located on the [employer] [place]. This way of eating has been shown to help prevent certain conditions and improve outcomes for people who have chronic diseases, like kidney disease and heart disease. What are tips for following this plan? Lifestyle  Cook and eat meals together with your family, when possible. Drink enough fluid to keep your urine clear or pale yellow. Be physically active every day. This includes: Aerobic exercise like running or swimming. Leisure  activities like gardening, walking, or housework. Get 7-8 hours of sleep each night. If recommended by your health care provider, drink red wine in moderation. This means 1 glass a day for nonpregnant women and 2 glasses a day for men. A glass of wine equals 5 oz (150 mL). Reading food labels  Check the serving size of packaged foods. For foods such as rice and pasta, the serving size refers to the amount of cooked product, not dry. Check the total fat in packaged foods. Avoid foods that have saturated fat or trans fats. Check the ingredients list for added sugars, such as corn syrup. Shopping  At the grocery store, buy most of your food from the areas near the walls of the store. This includes: Fresh fruits and vegetables (produce). Grains, beans, nuts, and seeds. Some of these may be available in unpackaged forms or large amounts (in bulk). Fresh seafood. Poultry and eggs. Low-fat dairy products. Buy whole ingredients instead of prepackaged foods. Buy fresh fruits and vegetables in-season from local farmers markets. Buy frozen fruits and vegetables in resealable bags. If you do not have access to quality fresh seafood, buy precooked frozen shrimp or canned fish, such as tuna, salmon, or sardines. Buy small amounts of raw or cooked vegetables, salads, or olives from the deli or salad bar at your store. Stock your pantry so you always have certain foods on hand, such as olive oil, canned tuna, canned tomatoes, rice, pasta, and beans. Cooking  [employer]  with extra-virgin olive oil instead of using butter or other vegetable oils. Have meat as a side dish, and have vegetables or grains as your main dish. This means having meat in small portions or adding small amounts of meat to foods like pasta or stew. Use beans or vegetables instead of meat in common dishes like chili or lasagna. Experiment with different cooking methods. Try roasting or broiling vegetables instead of steaming or sauteing  them. Add frozen vegetables to soups, stews, pasta, or rice. Add nuts or seeds for added healthy fat at each meal. You can add these to yogurt, salads, or vegetable dishes. Marinate fish or vegetables using olive oil, lemon juice, garlic, and fresh herbs. Meal planning  Plan to eat 1 vegetarian meal one day each week. Try to work up to 2 vegetarian meals, if possible. Eat seafood 2 or more times a week. Have healthy snacks readily available, such as: Vegetable sticks with hummus. Greek yogurt. Fruit and nut trail mix. Eat balanced meals throughout the week. This includes: Fruit: 2-3 servings a day Vegetables: 4-5 servings a day Low-fat dairy: 2 servings a day Fish, poultry, or lean meat: 1 serving a day Beans and legumes: 2 or more servings a week Nuts and seeds: 1-2 servings a day Whole grains: 6-8 servings a day Extra-virgin olive oil: 3-4 servings a day Limit red meat and sweets to only a few servings a month What are my food choices? Mediterranean diet Recommended Grains: Whole-grain pasta. Brown rice. Bulgar wheat. Polenta. Couscous. Whole-wheat bread. [place] Made[place]. Vegetables: Artichokes. Beets. Broccoli. Cabbage. Carrots. Eggplant. Green beans. Chard. Kale. Spinach. Onions. Leeks. Peas. Squash. Tomatoes. Peppers. Radishes. Fruits: Apples. Apricots. Avocado. Berries. Bananas. Cherries. Dates. Figs. Grapes. Lemons. Melon. Oranges. Peaches. Plums. Pomegranate. Meats and other protein foods: Beans. Almonds. Sunflower seeds. Pine nuts. Peanuts. Cod. Salmon. Scallops. Shrimp. Tuna. Tilapia. Clams. Oysters. Eggs. Dairy: Low-fat milk. Cheese. Greek yogurt. Beverages: Water. Red wine. Herbal tea. Fats and oils: Extra virgin olive oil. Avocado oil. Grape seed oil. Sweets and desserts: [place] yogurt with honey. Baked apples. Poached pears. Trail mix. Seasoning and other foods: Basil. Cilantro. Coriander. Cumin. Mint. Parsley. Sage. Rosemary. Tarragon. Garlic. Oregano. Thyme. [name].  Balsalmic vinegar. Tahini. Hummus. Tomato sauce. Olives. Mushrooms. Limit these Grains: Prepackaged pasta or rice dishes. Prepackaged cereal with added sugar. Vegetables: Deep fried potatoes (french fries). Fruits: Fruit canned in syrup. Meats and other protein foods: Beef. Pork. Lamb. Poultry with skin. Hot dogs. [place]. Dairy: Ice cream. Sour cream. Whole milk. Beverages: Juice. Sugar-sweetened soft drinks. Beer. Liquor and spirits. Fats and oils: Butter. Canola oil. Vegetable oil. Beef fat (tallow). Lard. Sweets and desserts: Cookies. Cakes. Pies. Candy. Seasoning and other foods: Mayonnaise. Premade sauces and marinades. The items listed may not be a complete list. Talk with your dietitian about what dietary choices are right for you. Summary The Mediterranean diet includes both food and lifestyle choices. Eat a variety of fresh fruits and vegetables, beans, nuts, seeds, and whole grains. Limit the amount of red meat and sweets that you eat. Talk with your health care provider about whether it is safe for you to drink red wine in moderation. This means 1 glass a day for nonpregnant women and 2 glasses a day for men. A glass of wine equals 5 oz (150 mL). This information is not intended to replace advice given to you by your health care provider. Make sure you discuss any questions you have with your health care provider. Document Released: 09/03/2015 Document Revised: 10/06/2015  Document Reviewed: 09/03/2015 Elsevier Interactive Patient Education  2017 [employer].

## 2023-11-07 ENCOUNTER — Ambulatory Visit: Payer: Self-pay | Admitting: Physician Assistant

## 2023-11-08 ENCOUNTER — Telehealth: Payer: Self-pay | Admitting: Neurosurgery

## 2023-11-08 ENCOUNTER — Other Ambulatory Visit: Payer: Self-pay | Admitting: Physician Assistant

## 2023-11-08 DIAGNOSIS — M542 Cervicalgia: Secondary | ICD-10-CM

## 2023-11-08 NOTE — Telephone Encounter (Signed)
 Patient's husband is calling regarding the mention of patient getting injections.    Can you send a referral to a pain clinic in [place], so we can get the patient scheduled?  Thank you!

## 2023-11-09 ENCOUNTER — Other Ambulatory Visit: Payer: Self-pay | Admitting: Physician Assistant

## 2023-11-09 ENCOUNTER — Encounter: Payer: Self-pay | Admitting: Internal Medicine

## 2023-11-09 ENCOUNTER — Ambulatory Visit: Admitting: Internal Medicine

## 2023-11-09 VITALS — BP 110/70 | HR 93 | Temp 97.3°F | Resp 18 | Ht <= 58 in | Wt 99.2 lb

## 2023-11-09 DIAGNOSIS — M542 Cervicalgia: Secondary | ICD-10-CM

## 2023-11-09 DIAGNOSIS — M4802 Spinal stenosis, cervical region: Secondary | ICD-10-CM | POA: Diagnosis not present

## 2023-11-09 DIAGNOSIS — M47812 Spondylosis without myelopathy or radiculopathy, cervical region: Secondary | ICD-10-CM

## 2023-11-09 LAB — TSH: TSH: 4.28 m[IU]/L (ref 0.40–4.50)

## 2023-11-09 LAB — VITAMIN B1: Vitamin B1 (Thiamine): 8 nmol/L (ref 8–30)

## 2023-11-09 NOTE — Telephone Encounter (Signed)
 Referral sent 11/08/2023

## 2023-11-09 NOTE — Progress Notes (Signed)
 Office Visit  Subjective   Patient ID: Amanda Cannon   DOB: 12/20/1945   Age: 78 y.o.   MRN: [id number]   Chief Complaint Chief Complaint  Patient presents with   office visit    Patient here to discuss recent visit     History of Present Illness The patient is a 78 yo female who returns today for followup of her chronic neck pain.  I saw her last month where I felt she needed a MRI of her cervical spine as her last one was done in 2021.  She had a new lump on her mid thoracic spine at that time.  We ordered plain films of her cervical and thoracic spine which were done on 09/21/2023 but have not been read yet.  I started her on gabapentin  100mg  which she only took one dose and she states it made her depressed.  She stopped taking the gabapentin  after one dose.  She also states she is more depressed and she believes it is due to the pain. Again, she was seen by sports medicine with Dr. Leonce in 05/2023 where she had pain of her neck where she had problems when she turns her head to the left, moves it up and down, and when she sleeps. When she raises her left arm it sounds like there is cracking in the bones that is not normal.  She states she has never had a xray done in the last 2-3 years.  Dr. Leonce felt she had a cervical DDD and strain of her left trapezious muscle.  She underwent a C7-T1 epidural CSI which was performed on 08/02/2023 but this did not help and she went for a second ESI about 2 weeks.  This injection did not help as well.  He recommended using tylenol 500 to 1000 mg tablets 2-3 times a day for day-to-day pain relief and use meloxicam  but the patient states it has not helped.   She cannot ride in car, go shopping, play games, move her head down or do things anymore.  She is having pain of her entire neck down to her shoulder.   She has found a new lump over her midline lower back/thoracic area.  There is no pain in her lower back.  She states she is getting depressed as she  cannot do anything physically and she is having crying episodes.    She denies any weakness/numbness of hands or feet or loss of bowel/bladder function.   I referred her to neurosurgery for her cervical spondylosis where they saw her on 10/10/2023 and they did do a cervical MRI on 10/04/2023 and this showed progression of spiral retrolisthesis at C4-5 and C5-6, and grade 1 anterolisthesis at C6-7 and C7-T1.  There was progression of uncovertebral and facet hypertrophy at C2-3 with moderate left and mild right foraminal stenosis.  There was progression of rightward disc osteophyte complex at C3-4 with effacement of the ventral CSF on the right and progressive moderate foraminal narrowing, worse on the right.  There was stable moderate foraminal stenosis bilaterally at C4-5, right greater than left. Stable moderate foraminal stenosis at C5-6, right greater than left.  There was some concern for a possible venous thrombosis where they sent her urgently to the ER on 10/23/2023 but she had a CT venogram which was negative.  She saw neurosurgery again on 11/01/2023 where there was some concern for cognition changes and they wanted her to see neurology.  They discussed the significant amount of  spondylosis in her cervical spine, however she has no clinical signs of myelopathy.  She has not had any facet injections or medial branch blocks to see whether or not there are nonsurgical options for her neck pain.  Given the fact that she does not have any progressive deficit from a compression standpoint and does not have any signs of myelopathy or active radiculopathy, neurosurgery felt that she should undergo conservative management for her cervical spine prior to any intervention.  They have arranged for her to go for a facet injection but the patient tells me they were contacted by the referral office and told they do not do that type of injection.  They have a name from [facility] and they want to be referred to that  clinic for facet injections of the neck.  Also over the interim, she saw neurology on 11/06/2023 where they noted that her memory impairment was unclear.  They have referred her for neurocogitive testing at this time.  The patient is still undergoing therapies at [facility].     Past Medical History Past Medical History:  Diagnosis Date   Alcohol use    Anemia    Anxiety    Asthma    Basal cell carcinoma    forehead   Breast cancer (HCC) 1998   left    Colon polyps    GERD (gastroesophageal reflux disease)    Glaucoma    Hyperlipidemia    Hypertension    Hyponatremia    remote, mild   Mitral valve prolapse    Osteoarthritis    sees rheumatologist   Osteoporosis    sees rheumatologist   Personal history of radiation therapy 1998   PMR (polymyalgia rheumatica)    sees rheumatologist   Pneumonia    Prinzmetal angina    White matter changes    ? ischemic sm vs per pt     Allergies Allergies  Allergen Reactions   Acetaminophen Cough    Other Reaction(s): Unknown   Beta Adrenergic Blockers     Other Reaction(s): Other  Cognitive issues  Other Reaction(s): Mental Status Changes   Tetrahydrozoline-Zn Sulfate     Other Reaction(s): Mental Status Changes  Prescription eyedrops   Erythromycin     Stomach cramps  Other Reaction(s): Other (See Comments)  Stomach cramps, Stomach cramps   Erythromycin Base Other (See Comments)    Stomach issues   Other Other (See Comments)    Beta-blockers-patient states it makes her crazy   Sulfa Antibiotics Other (See Comments)    Doesn't recall   Turmeric     Lowers BP   Betamethasone Other (See Comments)    Other reaction(s): Not available   Oxycodone -Acetaminophen Nausea And Vomiting and Palpitations    Insomnia     Medications  Current Outpatient Medications:    amLODipine  (NORVASC ) 5 MG tablet, TAKE 1 TABLET (5 MG TOTAL) BY MOUTH DAILY., Disp: 90 tablet, Rfl: 3   aspirin 81 MG tablet, Take 81 mg by mouth daily.,  Disp: , Rfl:    Cholecalciferol 25 MCG (1000 UT) tablet, Take by mouth., Disp: , Rfl:    denosumab  (PROLIA ) 60 MG/ML SOLN injection, Inject 60 mg into the skin every 6 (six) months. Administer in upper arm, thigh, or abdomen, Disp: , Rfl:    dorzolamide (TRUSOPT) 2 % ophthalmic solution, Apply to eye., Disp: , Rfl:    DULoxetine  (CYMBALTA ) 30 MG capsule, TAKE 1 CAPSULE BY MOUTH EVERY DAY, Disp: 90 capsule, Rfl: 2  ezetimibe  (ZETIA ) 10 MG tablet, Take 1 tablet (10 mg total) by mouth daily., Disp: 90 tablet, Rfl: 3   famotidine  (PEPCID ) 40 MG tablet, TAKE 1 TABLET BY MOUTH EVERYDAY AT BEDTIME, Disp: 90 tablet, Rfl: 1   furosemide  (LASIX ) 20 MG tablet, Take 20 mg by mouth daily., Disp: , Rfl:    inclisiran (LEQVIO ) 284 MG/1.5ML SOSY injection, Inject 284 mg into the skin once., Disp: , Rfl:    latanoprost (XALATAN) 0.005 % ophthalmic solution, 1 drop at bedtime., Disp: , Rfl:    levothyroxine (SYNTHROID) 25 MCG tablet, Take 25 mcg by mouth daily., Disp: , Rfl:    LORazepam (ATIVAN) 1 MG tablet, Take 1 mg by mouth at bedtime as needed (spasms)., Disp: , Rfl:    magnesium  oxide (MAG-OX) 400 MG tablet, Take by mouth as needed., Disp: , Rfl:    meclizine  (ANTIVERT ) 25 MG tablet, TAKE 1 TABLET (25 MG TOTAL) BY MOUTH 2 (TWO) TIMES DAILY AS NEEDED FOR DIZZINESS., Disp: 60 tablet, Rfl: 2   sodium chloride  (OCEAN) 0.65 % SOLN nasal spray, Place 1 spray into both nostrils as needed for congestion., Disp: 37.5 mL, Rfl: 0   Review of Systems Review of Systems  Constitutional:  Negative for chills, fever, malaise/fatigue and weight loss.  Respiratory:  Negative for cough and shortness of breath.   Cardiovascular:  Negative for chest pain, palpitations and leg swelling.  Gastrointestinal:  Negative for abdominal pain, constipation, diarrhea, heartburn, nausea and vomiting.  Musculoskeletal:  Negative for myalgias.  Skin:  Negative for itching and rash.  Neurological:  Negative for dizziness, weakness and  headaches.       Objective:    Vitals BP 110/70   Pulse 93   Temp (!) 97.3 F (36.3 C)   Resp 18   Ht 4' 8 (1.422 m)   Wt 99 lb 4 oz (45 kg)   SpO2 97%   BMI 22.25 kg/m    Physical Examination Physical Exam Constitutional:      Appearance: Normal appearance. She is not ill-appearing.  Cardiovascular:     Rate and Rhythm: Normal rate and regular rhythm.     Pulses: Normal pulses.     Heart sounds: No murmur heard.    No friction rub. No gallop.  Pulmonary:     Effort: Pulmonary effort is normal. No respiratory distress.     Breath sounds: No wheezing, rhonchi or rales.  Abdominal:     General: Bowel sounds are normal. There is no distension.     Palpations: Abdomen is soft.     Tenderness: There is no abdominal tenderness.  Musculoskeletal:     Right lower leg: No edema.     Left lower leg: No edema.  Skin:    General: Skin is warm and dry.     Findings: No rash.  Neurological:     General: No focal deficit present.     Mental Status: She is alert and oriented to person, place, and time.        Assessment & Plan:   Cervical spondylosis I have reviewed neurosurgery and neurology's notes.  We will refer her to [facility] for neck facet joint injections.  She will follow up with neurology as directed.    No follow-ups on file.   Selinda Fleeta Finger, MD

## 2023-11-10 ENCOUNTER — Telehealth: Payer: Self-pay | Admitting: Physician Assistant

## 2023-11-10 DIAGNOSIS — Z23 Encounter for immunization: Secondary | ICD-10-CM | POA: Diagnosis not present

## 2023-11-10 NOTE — Assessment & Plan Note (Signed)
 I have reviewed neurosurgery and neurology's notes.  We will refer her to [facility] for neck facet joint injections.  She will follow up with neurology as directed.

## 2023-11-10 NOTE — Telephone Encounter (Signed)
 Pt and husband want to know what vitamins were suggested that they purchase

## 2023-11-14 ENCOUNTER — Ambulatory Visit: Admitting: Family Medicine

## 2023-11-14 ENCOUNTER — Encounter: Payer: Self-pay | Admitting: Psychology

## 2023-11-14 DIAGNOSIS — M47812 Spondylosis without myelopathy or radiculopathy, cervical region: Secondary | ICD-10-CM | POA: Diagnosis not present

## 2023-11-14 DIAGNOSIS — M542 Cervicalgia: Secondary | ICD-10-CM | POA: Diagnosis not present

## 2023-11-15 ENCOUNTER — Ambulatory Visit (INDEPENDENT_AMBULATORY_CARE_PROVIDER_SITE_OTHER): Admitting: Psychology

## 2023-11-15 ENCOUNTER — Ambulatory Visit: Payer: Self-pay

## 2023-11-15 ENCOUNTER — Encounter: Payer: Self-pay | Admitting: Psychology

## 2023-11-15 DIAGNOSIS — F0670 Mild neurocognitive disorder due to known physiological condition without behavioral disturbance: Secondary | ICD-10-CM | POA: Diagnosis not present

## 2023-11-15 DIAGNOSIS — R4189 Other symptoms and signs involving cognitive functions and awareness: Secondary | ICD-10-CM

## 2023-11-15 NOTE — Progress Notes (Signed)
   Psychometrician Note   Cognitive testing was administered to Amanda Cannon by Amanda Cannon, B.S. (psychometrist) under the supervision of Amanda Cannon, Ph.D., ABPP, licensed psychologist on 11/15/2023. Amanda Cannon did not appear overtly distressed by the testing session per behavioral observation or responses across self-report questionnaires. Rest breaks were offered.   The battery of tests administered was selected by Amanda Cannon, Ph.D., ABPP with consideration to Amanda Cannon's current level of functioning, the nature of her symptoms, emotional and behavioral responses during interview, level of literacy, observed level of motivation/effort, and the nature of the referral question. This battery was communicated to the psychometrist. Communication between Amanda Cannon, Ph.D., ABPP and the psychometrist was ongoing throughout the evaluation and Amanda Cannon, Ph.D., ABPP was immediately accessible at all times. Amanda Cannon, Ph.D., ABPP provided supervision to the psychometrist on the date of this service to the extent necessary to assure the quality of all services provided.    Amanda Cannon will return within approximately 1-2 weeks for an interactive feedback session with Dr. Maryland at which time her test performances, clinical impressions, and treatment recommendations will be reviewed in detail. Amanda Cannon understands she can contact our office should she require our assistance before this time.  A total of 202 minutes of billable time were spent face-to-face with Amanda Cannon by the psychometrist. This includes both test administration and scoring time. Billing for these services is reflected in the clinical report generated by Amanda Cannon, Ph.D., ABPP  This note reflects time spent with the psychometrician and does not include test scores or any clinical interpretations made by Dr. Maryland. The full report will follow in a separate  note.

## 2023-11-15 NOTE — Progress Notes (Signed)
 NEUROPSYCHOLOGICAL EVALUATION [place]. [facility] Department of Neurology  Date of Evaluation: November 15, 2023  Reason for Referral:   Amanda Cannon is a 78 y.o. right-handed Caucasian female referred by Camie Sevin, PA-C, to characterize her current cognitive functioning and assist with diagnostic clarity and treatment planning in the context of subjective cognitive decline.   Assessment and Plan:   Clinical Impression(s): Amanda Cannon pattern of performance is suggestive of impairment surrounding processing speed, receptive language, expressive language (i.e., both verbal fluency and confrontation naming), and encoding (i.e., learning) aspects of verbal memory. Further weakness/variability was exhibited across executive functioning, visuospatial abilities, and delayed retrieval aspects of memory. Performances were appropriate relative to age-matched peers across attention/concentration and recognition/consolidation aspects of memory. Functionally, Amanda Cannon denied difficulties completing instrumental activities of daily living (ADLs) independently. Her husband was in agreement. As such, given evidence for cognitive dysfunction described above, she meets criteria for a Mild Neurocognitive Disorder (mild cognitive impairment) at the present time.  The cause for ongoing cognitive impairment is unclear as her testing profile is nonspecific in nature. She described an abrupt onset of immense neck and shoulder pain about 3-5 months prior. Symptoms were said to acutely worsen sleep and create regular headache experiences. While symptoms have improved over time, some discomfort remains. Furthermore, past neuroimaging suggested microvascular ischemic disease, recent labs suggested a low B1 (thiamine) level and medical records suggest her recently reporting two bourbon drinks nightly, and she described acute anxiety surrounding testing procedures. It remains  plausible that her current testing profile is a combination of chronic medical illness, chronic pain, sleep disruption, and psychiatric distress. Polypharmacy could also be playing a small role, namely surrounding cognitive side effects associated with lorazepam/Ativan and meclizine /Antivert .   Given the nonspecific nature of her profile, current testing does not suggest an underlying neurodegenerative illness in a particularly compelling fashion presently. With that being said, deficits surrounding language (both receptive and expressive) and verbal memory will be important to actively monitor over time. Current deficits could represent the very early beginnings of a neurodegenerative illness. However, this cannot be stated with confidence at the present time.   Recommendations: A repeat neuropsychological evaluation in 12-18 months is recommended to assess the trajectory of future cognitive decline should it occur.  If desired, Amanda Cannon could discuss medication aimed to address memory loss concerns with Amanda Cannon. It is important to highlight that these medications have been shown to slow functional decline in some individuals. There is no current treatment which can stop or reverse cognitive decline when caused by a neurodegenerative illness.   Performance across neurocognitive testing is not a strong predictor of an individual's safety operating a motor vehicle. Should her family wish to pursue a formalized driving evaluation, they could reach out to the following agencies: [employer] in [place]: [phone] [facility]: [phone] [facility]: [phone] [facility]: [phone] or [phone]  Should there be progression of current deficits over time, Amanda Cannon is unlikely to regain any independent living skills lost. Therefore, it is recommended that she remain as involved as possible in all aspects of household chores,  finances, and medication management, with supervision to ensure adequate performance. She will likely benefit from the establishment and maintenance of a routine in order to maximize her functional abilities over time.  It will be important for Amanda Cannon to have another person with her when in situations where she may need to process information, weigh the pros and  cons of different options, and make decisions, in order to ensure that she fully understands and recalls all information to be considered.  If not already done, Amanda Cannon and her family may want to discuss her wishes regarding durable power of attorney and medical decision making, so that she can have input into these choices. If they require legal assistance with this, long-term care resource access, or other aspects of estate planning, they could reach out to [employer] at [phone] for a free consultation. Additionally, they may wish to discuss future plans for caretaking and seek out community options for in home/residential care should they become necessary.  Amanda Cannon is encouraged to attend to lifestyle factors for brain health (e.g., regular physical exercise, good nutrition habits and consideration of the MIND-DASH diet, regular participation in cognitively-stimulating activities, and general stress management techniques), which are likely to have benefits for both emotional adjustment and cognition. Optimal control of vascular risk factors (including safe cardiovascular exercise and adherence to dietary recommendations) is encouraged. Continued participation in activities which provide mental stimulation and social interaction is also recommended.   Important information should be provided to Amanda Cannon in written format in all instances. This information should be placed in a highly frequented and easily visible location within her home to promote recall. External strategies such as written notes in a  consistently used memory journal, visual and nonverbal auditory cues such as a calendar on the refrigerator or appointments with alarm, such as on a cell phone, can also help maximize recall.  To address problems with processing speed, she may wish to consider:   -Ensuring that she is alerted when essential material or instructions are being presented   -Adjusting the speed at which new information is presented   -Allowing for more time in comprehending, processing, and responding in conversation   -Repeating and paraphrasing instructions or conversations aloud  To address problems with fluctuating attention and/or executive dysfunction, she may wish to consider:   -Avoiding external distractions when needing to concentrate   -Limiting exposure to fast paced environments with multiple sensory demands   -Writing down complicated information and using checklists   -Attempting and completing one task at a time (i.e., no multi-tasking)   -Verbalizing aloud each step of a task to maintain focus   -Taking frequent breaks during the completion of steps/tasks to avoid fatigue   -Reducing the amount of information considered at one time   -Scheduling more difficult activities for a time of day where she is usually most alert  Review of Records:   Past Medical History:  Diagnosis Date   Acquired hypothyroidism 05/12/2023   Age-related osteoporosis without current pathological fracture 01/15/2018   On Prolia  every 6 months along with calcium  and vitamin D    Anemia    Anxiety    Aortic atherosclerosis 03/09/2023   Asthma    Basal cell carcinoma    forehead   Bilateral hearing loss 03/23/2018   Breast pain, left 01/15/2018   Cervical pain 09/28/2023   Cervical radiculopathy 09/28/2023   Cervical spondylosis 09/14/2023   Colon polyps    Coronary artery disease involving native coronary artery of native heart without angina pectoris 03/09/2023   DDD (degenerative disc disease), cervical  06/22/2021   Depression 07/15/2015   Esophageal spasm 07/15/2015   Essential hypertension 07/15/2015   ETD (Eustachian tube dysfunction), bilateral 03/23/2018   Facial numbness 03/23/2018   Gastroesophageal reflux disease 08/06/2019   Glaucoma    History of tamoxifen therapy  Hypercalcemia 05/03/2022   Hyperlipidemia    Hyponatremia    remote, mild   Insomnia 03/09/2023   Malignant neoplasm of lower-outer quadrant of left breast of female, estrogen receptor positive 1998   Mitral valve prolapse    Monocytosis 05/11/2023   Osteoarthritis    sees rheumatologist   Osteoporosis    sees rheumatologist   Personal history of radiation therapy 1998   PMR (polymyalgia rheumatica)    sees rheumatologist   Pneumonia    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   Prinzmetal angina    Seasonal allergic rhinitis 05/02/2023   Stenosis of carotid artery 03/09/2023   Tinnitus 07/15/2015   Trigeminal neuropathy 11/13/2017   Vertigo 07/17/2018   Vestibular disequilibrium 09/06/2021    Past Surgical History:  Procedure Laterality Date   ABDOMINAL HYSTERECTOMY     BREAST LUMPECTOMY Left 1998   cancer   CESAREAN SECTION     x 4   COLONOSCOPY  07/2012   in [facility]   HERNIA REPAIR  2005   abdominal hernia repair   HYSTEROTOMY     LYMPH NODE DISSECTION Left    POLYPECTOMY      Current Outpatient Medications:    amLODipine  (NORVASC ) 5 MG tablet, TAKE 1 TABLET (5 MG TOTAL) BY MOUTH DAILY., Disp: 90 tablet, Rfl: 3   aspirin 81 MG tablet, Take 81 mg by mouth daily., Disp: , Rfl:    Cholecalciferol 25 MCG (1000 UT) tablet, Take by mouth., Disp: , Rfl:    denosumab  (PROLIA ) 60 MG/ML SOLN injection, Inject 60 mg into the skin every 6 (six) months. Administer in upper arm, thigh, or abdomen, Disp: , Rfl:    dorzolamide (TRUSOPT) 2 % ophthalmic solution, Apply to eye., Disp: , Rfl:    DULoxetine  (CYMBALTA ) 30 MG capsule, TAKE 1 CAPSULE BY MOUTH EVERY DAY, Disp: 90 capsule, Rfl: 2   ezetimibe  (ZETIA )  10 MG tablet, Take 1 tablet (10 mg total) by mouth daily., Disp: 90 tablet, Rfl: 3   famotidine  (PEPCID ) 40 MG tablet, TAKE 1 TABLET BY MOUTH EVERYDAY AT BEDTIME, Disp: 90 tablet, Rfl: 1   furosemide  (LASIX ) 20 MG tablet, Take 20 mg by mouth daily., Disp: , Rfl:    inclisiran (LEQVIO ) 284 MG/1.5ML SOSY injection, Inject 284 mg into the skin once., Disp: , Rfl:    latanoprost (XALATAN) 0.005 % ophthalmic solution, 1 drop at bedtime., Disp: , Rfl:    levothyroxine (SYNTHROID) 25 MCG tablet, Take 25 mcg by mouth daily., Disp: , Rfl:    LORazepam (ATIVAN) 1 MG tablet, Take 1 mg by mouth at bedtime as needed (spasms)., Disp: , Rfl:    magnesium  oxide (MAG-OX) 400 MG tablet, Take by mouth as needed., Disp: , Rfl:    meclizine  (ANTIVERT ) 25 MG tablet, TAKE 1 TABLET (25 MG TOTAL) BY MOUTH 2 (TWO) TIMES DAILY AS NEEDED FOR DIZZINESS., Disp: 60 tablet, Rfl: 2   sodium chloride  (OCEAN) 0.65 % SOLN nasal spray, Place 1 spray into both nostrils as needed for congestion., Disp: 37.5 mL, Rfl: 0     11/06/2023   12:00 PM  Montreal Cognitive Assessment   Visuospatial/ Executive (0/5) 1  Naming (0/3) 3  Attention: Read list of digits (0/2) 2  Attention: Read list of letters (0/1) 1  Attention: Serial 7 subtraction starting at 100 (0/3) 0  Language: Repeat phrase (0/2) 0  Language : Fluency (0/1) 0  Abstraction (0/2) 1  Delayed Recall (0/5) 1  Orientation (0/6) 5  Total 14  Adjusted Score (  based on education) 14   Neuroimaging: Brain MRI on 11/23/2017 suggested mild age-appropriate generalized cortical atrophy and mild microvascular ischemic disease. Brain MRI on 01/22/2020 was stable. Brain MRI on 10/22/2023 was also relatively stable. It did suggest an abnormal left jugular vein and sigmoid flow void concerning for venous sinus thrombosis which was new relative to her 2021 scan. A head CT venogram on 10/23/2023 did not suggest dural venous sinus thrombosis.   Clinical Interview:   The following  information was obtained during a clinical interview with Ms. Pfohl and her husband prior to cognitive testing.  Cognitive Symptoms: Decreased short-term memory: Largely denied. During the past 3-5 months, Ms. Leech reported a fairly abrupt onset of immense neck and shoulder pain. During this time, she reported generalized memory concerns largely surrounding trouble with word finding. Since being in ongoing treatment, symptoms have improved, along with her report of subjective cognitive dysfunction. Prior to the onset of neck and shoulder pain, Ms. Linskey largely denied more longstanding cognitive concerns. Her husband was generally in agreement.  Decreased long-term memory: Denied. Decreased attention/concentration: Denied. Reduced processing speed: Endorsed maybe a little bit. Difficulties with executive functions: Denied. She and her husband also denied trouble with impulsivity or any prominent personality changes.  Difficulties with emotion regulation: Denied. Difficulties with receptive language: Denied. Difficulties with word finding: Endorsed. She noted trouble finding words while speaking or using the correct word she intended to use in conversation. This has seemed improved since receiving pain-based treatment.  Decreased visuoperceptual ability: Denied.  Difficulties completing ADLs: Denied. She manages medications with independence and continues to drive without reported issue. Her husband manages finances and bill paying which is longstanding in nature.   Additional Medical History: History of traumatic brain injury/concussion: Denied. History of stroke: Denied. History of seizure activity: Denied. History of known exposure to toxins: Denied. Symptoms of chronic pain: As described above, she has been dealing with what she described as immense and intense pain surrounding her neck and shoulders. This has somewhat abated following injection-based treatment. Prior  to injection treatment, she reported being prescribed both Celebrex  and oxycodone  but had to discontinue both due to severe side effects.  Experience of frequent headaches/migraines: Recently, headache symptoms have increased in frequency related to ongoing neck and shoulder pain. Outside of this situation, she denied a more longstanding history of prominent headache experiences.  Frequent instances of dizziness/vertigo: She reported more longstanding vestibular dysfunction, dating back several years at least. This will occasionally impact her balance. The cause for this remains unknown.   Sensory changes: She utilizes reading glasses with benefit and acknowledged the potential for mild hearing loss. Other sensory changes/difficulties (e.g., taste or smell) were denied.  Balance/coordination difficulties: She described some mild instability at times, largely attributed to vestibular dysfunction and neck/shoulder pain more acutely. One side of the body was not said to be less stable relative to the other. No recent falls were reported.  Other motor difficulties: Denied.  Sleep History: Estimated hours obtained each night: 6-7 hours.  Difficulties falling asleep: Denied. However, lately, she will have nights where she has trouble both falling and staying asleep due to neck/shoulder pain. When pain is not present, no significant sleep difficulties were reported.  Difficulties staying asleep: Denied. Feels rested and refreshed upon awakening: Endorsed.  History of snoring: Denied. History of waking up gasping for air: Denied. Witnessed breath cessation while asleep: Denied.  History of vivid dreaming: Denied. Excessive movement while asleep: Denied. Instances of acting out her dreams:  Denied.  Psychiatric/Behavioral Health History: Depression: When asked about her current mood, she noted that this has been improving as her physical status has been improving. She acknowledged a period of depressed  mood during the heights of neck/shoulder pain. Historically, she denied to her knowledge ever being formally diagnosed with a mental health condition. Current or remote suicidal ideation, intent, or plan was denied.  Anxiety: Her husband reported Ms. Yono having a longstanding history of some mild generalized anxious distress.  Mania: Denied. Trauma History: Denied. Visual/auditory hallucinations: Denied outside of side effects from oxycodone .  Delusional thoughts: Denied.  Tobacco: Denied. Alcohol: She reported one mixed drink per night, emphasizing that she dilutes this drink with water. Medical records suggest previous reporting surrounding two bourbon based drinks nightly. She denied a history of problematic alcohol abuse or dependence.  Recreational drugs: Denied.  Family History: Problem Relation Age of Onset   Coronary artery disease Mother 102   Coronary artery disease Father 64   Cancer - Ovarian Sister    Colon cancer Cousin        x 2   Esophageal cancer Neg Hx    Rectal cancer Neg Hx    Stomach cancer Neg Hx    This information was confirmed by Ms. Ueda.  Academic/Vocational History: Highest level of educational attainment: 13 years. She graduated from high school and completed one additional year of college. She described herself as an average (A/B) student in academic settings. Reading was noted as a relative weakness in early academic settings, requiring enrollment in specialized courses to provide extra assistance.  History of developmental delay: Denied. History of grade repetition: Denied. Enrollment in special education courses: Denied. History of LD/ADHD: Denied.  Employment: Retired. 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   Evaluation Results:   Behavioral Observations: Ms. Dungee was accompanied by her husband, arrived to her appointment on time, and was  appropriately dressed and groomed. She appeared alert. Observed gait and station were within normal limits. Gross motor functioning appeared intact upon informal observation and no abnormal movements (e.g., tremors) were noted. Her affect was generally relaxed and positive. However, she did express anxiety surrounding testing procedures. Spontaneous speech was fluent and word finding difficulties were not observed during the clinical interview. Thought processes were coherent, organized, and normal in content. Insight into her cognitive difficulties appeared limited. While Ms. Benko may have simply been attempting to present herself in an overly favorable light, there is concern that she does not fully appreciate ongoing cognitive dysfunction at the present time.   During testing, sustained attention was appropriate. Task engagement was adequate and she persisted when challenged. Overall, Ms. Kneale was cooperative with the clinical interview and subsequent testing procedures.   Adequacy of Effort: The validity of neuropsychological testing is limited by the extent to which the individual being tested may be assumed to have exerted adequate effort during testing. Ms. Coyne expressed her intention to perform to the best of her abilities and exhibited adequate task engagement and persistence. Scores across stand-alone and embedded performance validity measures were within expectation. As such, the results of the current evaluation are believed to be a valid representation of Ms. Schrader's current cognitive functioning.  Test Results: Ms. Randle was generally oriented at the time of the current evaluation. She was unable to estimate the current time.   Intellectual abilities based upon educational and vocational attainment were estimated to  be in the average range. Premorbid abilities were estimated to be within the lower limits of the average range based upon a single-word reading  test.   Processing speed was exceptionally low to below average. Basic attention was below average to average. More complex attention (e.g., working memory) was average. Executive functioning was variable, ranging from the exceptionally low to average normative ranges.  Assessed receptive language abilities were exceptionally low. Ms. Howser exhibited difficulties across several aspects of this task, including understanding more complex sentence structure, correctly sequencing commands, and correctly following multi-step commands. She generally did not exhibit difficulties answering questions asked of her during interview. Assessed expressive language (e.g., verbal fluency and confrontation naming) was exceptionally low to below average.     Assessed visuospatial/visuoconstructional abilities were variable, ranging from the well below average to above average normative ranges. Points were lost across her drawing of a clock due to incorrect hand placement. Across her copy of a complex figure, points were lost due to a few mild visual distortions and one aspect being omitted entirely.    Learning (i.e., encoding) of novel verbal information was well below average to below average. Spontaneous delayed recall (i.e., retrieval) of previously learned information was variable, ranging from the exceptionally low to average normative ranges. Retention rates were 33% across a list learning task, 25% across a story learning task, and 87% across a figure drawing task. Performance across recognition tasks was below average to average, suggesting evidence for information consolidation.   Results of emotional screening instruments suggested that recent symptoms of generalized anxiety were in the mild range, while symptoms of depression were within normal limits. A screening instrument assessing recent sleep quality suggested the presence of minimal sleep dysfunction.  Table of Scores:   Note: This summary of test  scores accompanies the interpretive report and should not be considered in isolation without reference to the appropriate sections in the text. Descriptors are based on appropriate normative data and may be adjusted based on clinical judgment. Terms such as Within Normal Limits and Outside Normal Limits are used when a more specific description of the test score cannot be determined.       Percentile - Normative Descriptor > 98 - Exceptionally High 91-97 - Well Above Average 75-90 - Above Average 25-74 - Average 9-24 - Below Average 2-8 - Well Below Average < 2 - Exceptionally Low       Validity:   DESCRIPTOR       DCT: --- --- Within Normal Limits  RBANS EI: --- --- Within Normal Limits       Orientation:      Raw Score Percentile   NAB Orientation, Form 1 27/29 --- ---       Cognitive Screening:      Raw Score Percentile   SLUMS: 14/30 --- ---       RBANS, Form A: Standard Score/ Scaled Score Percentile   Total Score 64 1 Exceptionally Low  Immediate Memory 76 5 Well Below Average    List Learning 7 16 Below Average    Story Memory 4 2 Well Below Average  Visuospatial/Constructional 75 5 Well Below Average    Figure Copy 5 5 Well Below Average    Line Orientation 13/20 10-16 Below Average  Language 57 <1 Exceptionally Low    Picture Naming 7/10 <2 Exceptionally Low    Semantic Fluency 3 1 Exceptionally Low  Attention 75 5 Well Below Average    Digit Span 10 50 Average  Coding 2 <1 Exceptionally Low  Delayed Memory 75 5 Well Below Average    List Recall 2/10 10-16 Below Average    List Recognition 17/20 10-16 Below Average    Story Recall 2 <1 Exceptionally Low    Story Recognition 8/12 8-15 Below Average    Figure Recall 10 50 Average    Figure Recognition 7/8 53-69 Average        Intellectual Functioning:      Standard Score Percentile   Test of Premorbid Functioning: 92 30 Average       Attention/Executive Function:     Trail Making Test (TMT): Raw  Score (T Score) Percentile     Part A 48 secs.,  0 errors (41) 18 Below Average    Part B Discontinued --- Impaired         Scaled Score Percentile   WAIS-5 Coding: 6 9 Below Average  WAIS-5 Naming Speed Quantity: 7 16 Below Average        Scaled Score Percentile   WAIS-5 Digits Forwards: 7 16 Below Average  WAIS-5 Digit Sequencing: 8 25 Average        Scaled Score Percentile   WAIS-5 Similarities: 8 25 Average  WAIS-5 Figure Weights: 6 9 Below Average       D-KEFS Verbal Fluency Test: Raw Score (Scaled Score) Percentile     Letter Total Correct 23 (6) 9 Below Average    Category Total Correct 17 (4) 2 Well Below Average    Category Switching Total Correct 6 (3) 1 Exceptionally Low    Category Switching Accuracy 5 (4) 2 Well Below Average      Total Set Loss Errors 4 (8) 25 Average      Total Repetition Errors 1 (12) 75 Above Average       Language:     Verbal Fluency Test: Raw Score (T Score) Percentile     Phonemic Fluency (FAS) 23 (35) 7 Well Below Average    Animal Fluency 9 (28) 2 Well Below Average        NAB Language Module, Form 1: T Score Percentile     Auditory Comprehension 26 1 Exceptionally Low    Naming 26/31 (35) 7 Well Below Average       Visuospatial/Visuoconstruction:      Raw Score Percentile   Clock Drawing: 8/10 --- Within Normal Limits        Scaled Score Percentile   WAIS-5 Block Design: 12 75 Above Average       Mood and Personality:      Raw Score Percentile   Geriatric Depression Scale: 2 --- Within Normal Limits  Geriatric Anxiety Scale: 14 --- Mild    Somatic 6 --- Mild    Cognitive 5 --- Mild    Affective 3 --- Minimal       Additional Questionnaires:      Raw Score Percentile   PROMIS Sleep Disturbance Questionnaire: 14 --- None to Slight   Informed Consent and Coding/Compliance:   The current evaluation represents a clinical evaluation for the purposes previously outlined by the referral source and is in no way reflective of a  forensic evaluation.   Ms. Mcclish was provided with a verbal description of the nature and purpose of the present neuropsychological evaluation. Also reviewed were the foreseeable risks and/or discomforts and benefits of the procedure, limits of confidentiality, and mandatory reporting requirements of this provider. The patient was given the opportunity to ask questions and receive answers about the evaluation.  Oral consent to participate was provided by the patient.   This evaluation was conducted by Arthea KYM Maryland, Ph.D., ABPP-CN, board certified clinical neuropsychologist. Ms. Conrow completed a clinical interview with Dr. Maryland, billed as one unit [phone], and 202 minutes of cognitive testing and scoring, billed as one unit [phone] and six additional units [id number]. Psychometrist Evalene Pizza, B.S. assisted Dr. Maryland with test administration and scoring procedures. As a separate and discrete service, one unit [phone] and two units 96133 (162 minutes) were billed for Dr. Loralee time spent in interpretation and report writing.

## 2023-11-17 DIAGNOSIS — M542 Cervicalgia: Secondary | ICD-10-CM | POA: Diagnosis not present

## 2023-11-17 DIAGNOSIS — M4802 Spinal stenosis, cervical region: Secondary | ICD-10-CM | POA: Diagnosis not present

## 2023-11-22 ENCOUNTER — Ambulatory Visit (INDEPENDENT_AMBULATORY_CARE_PROVIDER_SITE_OTHER): Admitting: Psychology

## 2023-11-22 DIAGNOSIS — F0670 Mild neurocognitive disorder due to known physiological condition without behavioral disturbance: Secondary | ICD-10-CM | POA: Diagnosis not present

## 2023-11-22 NOTE — Progress Notes (Signed)
   Neuropsychology Feedback Session Amanda Cannon. [facility] [place] Department of Neurology  Reason for Referral:   Amanda Cannon is a 78 y.o. right-handed Caucasian female referred by Camie Sevin, PA-C, to characterize her current cognitive functioning and assist with diagnostic clarity and treatment planning in the context of subjective cognitive decline.   Feedback:   Amanda Cannon completed a comprehensive neuropsychological evaluation on 11/15/2023. Please refer to that encounter for the full report and recommendations. Briefly, results suggested impairment surrounding processing speed, receptive language, expressive language (i.e., both verbal fluency and confrontation naming), and encoding (i.e., learning) aspects of verbal memory. Further weakness/variability was exhibited across executive functioning, visuospatial abilities, and delayed retrieval aspects of memory. The cause for ongoing cognitive impairment is unclear as her testing profile is nonspecific in nature. She described an abrupt onset of immense neck and shoulder pain about 3-5 months prior. Symptoms were said to acutely worsen sleep and create regular headache experiences. While symptoms have improved over time, some discomfort remains. Furthermore, past neuroimaging suggested microvascular ischemic disease, recent labs suggested a low B1 (thiamine) level and medical records suggest her recently reporting two bourbon drinks nightly, and she described acute anxiety surrounding testing procedures. It remains plausible that her current testing profile is a combination of chronic medical illness, chronic pain, sleep disruption, and psychiatric distress. Polypharmacy could also be playing a small role, namely surrounding cognitive side effects associated with lorazepam/Ativan and meclizine /Antivert .  Amanda Cannon was accompanied by her husband and daughter during the current feedback session. Content of the current  session focused on the results of her neuropsychological evaluation. Amanda Cannon was given the opportunity to ask questions and her questions were answered. She was encouraged to reach out should additional questions arise. A copy of her report was provided at the conclusion of the visit.      One unit 96132 (42 minutes) was billed for Dr. Loralee time spent preparing for, conducting, and documenting the current feedback session with Amanda Cannon.

## 2023-11-24 DIAGNOSIS — M4802 Spinal stenosis, cervical region: Secondary | ICD-10-CM | POA: Diagnosis not present

## 2023-11-24 DIAGNOSIS — M810 Age-related osteoporosis without current pathological fracture: Secondary | ICD-10-CM | POA: Diagnosis not present

## 2023-11-24 DIAGNOSIS — M542 Cervicalgia: Secondary | ICD-10-CM | POA: Diagnosis not present

## 2023-11-28 DIAGNOSIS — M4802 Spinal stenosis, cervical region: Secondary | ICD-10-CM | POA: Diagnosis not present

## 2023-11-28 DIAGNOSIS — M542 Cervicalgia: Secondary | ICD-10-CM | POA: Diagnosis not present

## 2023-11-30 DIAGNOSIS — M47812 Spondylosis without myelopathy or radiculopathy, cervical region: Secondary | ICD-10-CM | POA: Diagnosis not present

## 2023-12-05 ENCOUNTER — Ambulatory Visit: Admitting: Physician Assistant

## 2023-12-06 DIAGNOSIS — M353 Polymyalgia rheumatica: Secondary | ICD-10-CM | POA: Diagnosis not present

## 2023-12-06 DIAGNOSIS — M654 Radial styloid tenosynovitis [de Quervain]: Secondary | ICD-10-CM | POA: Diagnosis not present

## 2023-12-06 DIAGNOSIS — M5136 Other intervertebral disc degeneration, lumbar region with discogenic back pain only: Secondary | ICD-10-CM | POA: Diagnosis not present

## 2023-12-06 DIAGNOSIS — M810 Age-related osteoporosis without current pathological fracture: Secondary | ICD-10-CM | POA: Diagnosis not present

## 2023-12-06 DIAGNOSIS — M47812 Spondylosis without myelopathy or radiculopathy, cervical region: Secondary | ICD-10-CM | POA: Diagnosis not present

## 2023-12-06 DIAGNOSIS — Z6821 Body mass index (BMI) 21.0-21.9, adult: Secondary | ICD-10-CM | POA: Diagnosis not present

## 2023-12-06 DIAGNOSIS — R42 Dizziness and giddiness: Secondary | ICD-10-CM | POA: Diagnosis not present

## 2023-12-06 DIAGNOSIS — M5030 Other cervical disc degeneration, unspecified cervical region: Secondary | ICD-10-CM | POA: Diagnosis not present

## 2023-12-06 DIAGNOSIS — M1991 Primary osteoarthritis, unspecified site: Secondary | ICD-10-CM | POA: Diagnosis not present

## 2023-12-06 DIAGNOSIS — M25551 Pain in right hip: Secondary | ICD-10-CM | POA: Diagnosis not present

## 2023-12-07 ENCOUNTER — Ambulatory Visit: Admitting: Internal Medicine

## 2023-12-07 ENCOUNTER — Encounter: Payer: Self-pay | Admitting: Internal Medicine

## 2023-12-07 VITALS — BP 122/60 | HR 91 | Temp 97.8°F | Resp 18 | Ht <= 58 in | Wt 99.0 lb

## 2023-12-07 DIAGNOSIS — G894 Chronic pain syndrome: Secondary | ICD-10-CM

## 2023-12-07 DIAGNOSIS — I10 Essential (primary) hypertension: Secondary | ICD-10-CM

## 2023-12-07 NOTE — Assessment & Plan Note (Signed)
 She has had neck pain for months and she states that over the last few days it has greatly improved with the use of steriods.  She will continue to followup with rheumatology.

## 2023-12-07 NOTE — Progress Notes (Signed)
 Office Visit  Subjective   Patient ID: Amanda Cannon   DOB: 07-21-1945   Age: 78 y.o.   MRN: [id number]   Chief Complaint Chief Complaint  Patient presents with   Follow-up    3 month follow up     History of Present Illness The patient is a 78 year old female who presents for a follow-up evaluation of hypertension.  She was diagnosed with HTN in her late 41's.  The patient has not been checking her blood pressure at home. The patient's current medications include: amlodipine  5mg  daily and lasix  20mg  daily. The patient has been tolerating her medications well. The patient denies any headache, visual changes, chest pain, shortness of breath, weakness/numbness, and edema. She reports there have been no other symptoms noted.    The patient is a 78 yo female who returns today for followup of her chronic neck pain.  We ordered plain films of her cervical and thoracic spine which were done on 09/21/2023 but have not been read yet.  I started her on gabapentin  100mg  which she only took one dose and she states it made her depressed.  She stopped taking the gabapentin  after one dose.  She also states she is more depressed and she believes it is due to the pain. Again, she was seen by sports medicine with Dr. Leonce in 05/2023 where she had pain of her neck where she had problems when she turns her head to the left, moves it up and down, and when she sleeps. When she raises her left arm it sounds like there is cracking in the bones that is not normal.  She states she has never had a xray done in the last 2-3 years.  Dr. Leonce felt she had a cervical DDD and strain of her left trapezious muscle.  She underwent a C7-T1 epidural CSI which was performed on 08/02/2023 but this did not help and she went for a second ESI about 2 weeks.  This injection did not help as well.  He recommended using tylenol 500 to 1000 mg tablets 2-3 times a day for day-to-day pain relief and use meloxicam  but the patient states it  has not helped.   She cannot ride in car, go shopping, play games, move her head down or do things anymore.  She is having pain of her entire neck down to her shoulder.   She has found a new lump over her midline lower back/thoracic area.  There is no pain in her lower back.  She states she is getting depressed as she cannot do anything physically and she is having crying episodes.    She denies any weakness/numbness of hands or feet or loss of bowel/bladder function.   I referred her to neurosurgery for her cervical spondylosis where they saw her on 10/10/2023 and they did do a cervical MRI on 10/04/2023 and this showed progression of spiral retrolisthesis at C4-5 and C5-6, and grade 1 anterolisthesis at C6-7 and C7-T1.  There was progression of uncovertebral and facet hypertrophy at C2-3 with moderate left and mild right foraminal stenosis.  There was progression of rightward disc osteophyte complex at C3-4 with effacement of the ventral CSF on the right and progressive moderate foraminal narrowing, worse on the right.  There was stable moderate foraminal stenosis bilaterally at C4-5, right greater than left. Stable moderate foraminal stenosis at C5-6, right greater than left.  There was some concern for a possible venous thrombosis where they sent her urgently  to the ER on 10/23/2023 but she had a CT venogram which was negative.  She saw neurosurgery again on 11/01/2023 where there was some concern for cognition changes and they wanted her to see neurology.  They discussed the significant amount of spondylosis in her cervical spine, however she has no clinical signs of myelopathy.  She has not had any facet injections or medial branch blocks to see whether or not there are nonsurgical options for her neck pain.  Given the fact that she does not have any progressive deficit from a compression standpoint and does not have any signs of myelopathy or active radiculopathy, neurosurgery felt that she should undergo  conservative management for her cervical spine prior to any intervention.  They have arranged for her to go for a facet injection but the patient tells me they were contacted by the referral office and told they do not do that type of injection.  They have a name from [facility] and they want to be referred to that clinic for facet injections of the neck.  Also over the interim, she saw neurology on 11/06/2023 where they noted that her memory impairment was unclear.  They have referred her for neurocogitive testing at this time.  The patient is still undergoing therapies at [facility].  She did see [facility] over the interim where they state she did have facet injections which did not help.  She was given tizanidine.  She also saw rheumatology where her husband states she was placed on some prednisone and her pain is doing better.     Past Medical History Past Medical History:  Diagnosis Date   Acquired hypothyroidism 05/12/2023   Age-related osteoporosis without current pathological fracture 01/15/2018   On Prolia  every 6 months along with calcium  and vitamin D    Anemia    Anxiety    Aortic atherosclerosis 03/09/2023   Asthma    Basal cell carcinoma    forehead   Bilateral hearing loss 03/23/2018   Breast pain, left 01/15/2018   Cervical pain 09/28/2023   Cervical radiculopathy 09/28/2023   Cervical spondylosis 09/14/2023   Colon polyps    Coronary artery disease involving native coronary artery of native heart without angina pectoris 03/09/2023   DDD (degenerative disc disease), cervical 06/22/2021   Depression 07/15/2015   Esophageal spasm 07/15/2015   Essential hypertension 07/15/2015   ETD (Eustachian tube dysfunction), bilateral 03/23/2018   Facial numbness 03/23/2018   Gastroesophageal reflux disease 08/06/2019   Glaucoma    History of tamoxifen therapy    Hypercalcemia 05/03/2022   Hyperlipidemia    Hyponatremia    remote, mild   Insomnia 03/09/2023    Malignant neoplasm of lower-outer quadrant of left breast of female, estrogen receptor positive 1998   Mild neurocognitive disorder due to multiple etiologies 11/15/2023   Mitral valve prolapse    Monocytosis 05/11/2023   Osteoarthritis    sees rheumatologist   Osteoporosis    sees rheumatologist   Personal history of radiation therapy 1998   PMR (polymyalgia rheumatica)    sees rheumatologist   Pneumonia    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   Prinzmetal angina    Seasonal allergic rhinitis 05/02/2023   Stenosis of carotid artery 03/09/2023   Tinnitus 07/15/2015   Trigeminal neuropathy 11/13/2017   Vertigo 07/17/2018   Vestibular disequilibrium 09/06/2021     Allergies Allergies  Allergen Reactions   Acetaminophen Cough    Other Reaction(s): Unknown   Beta Adrenergic Blockers  Other Reaction(s): Other  Cognitive issues  Other Reaction(s): Mental Status Changes   Tetrahydrozoline-Zn Sulfate     Other Reaction(s): Mental Status Changes  Prescription eyedrops   Erythromycin     Stomach cramps  Other Reaction(s): Other (See Comments)  Stomach cramps, Stomach cramps   Erythromycin Base Other (See Comments)    Stomach issues   Other Other (See Comments)    Beta-blockers-patient states it makes her crazy   Sulfa Antibiotics Other (See Comments)    Doesn't recall   Turmeric     Lowers BP   Betamethasone Other (See Comments)    Other reaction(s): Not available   Oxycodone -Acetaminophen Nausea And Vomiting and Palpitations    Insomnia     Medications  Current Outpatient Medications:    amLODipine  (NORVASC ) 5 MG tablet, TAKE 1 TABLET (5 MG TOTAL) BY MOUTH DAILY., Disp: 90 tablet, Rfl: 3   aspirin 81 MG tablet, Take 81 mg by mouth daily., Disp: , Rfl:    Cholecalciferol 25 MCG (1000 UT) tablet, Take by mouth., Disp: , Rfl:    denosumab  (PROLIA ) 60 MG/ML SOLN injection, Inject 60 mg into the skin every 6 (six) months. Administer in upper arm, thigh, or  abdomen, Disp: , Rfl:    dorzolamide (TRUSOPT) 2 % ophthalmic solution, Apply to eye., Disp: , Rfl:    DULoxetine  (CYMBALTA ) 30 MG capsule, TAKE 1 CAPSULE BY MOUTH EVERY DAY, Disp: 90 capsule, Rfl: 2   ezetimibe  (ZETIA ) 10 MG tablet, Take 1 tablet (10 mg total) by mouth daily., Disp: 90 tablet, Rfl: 3   famotidine  (PEPCID ) 40 MG tablet, TAKE 1 TABLET BY MOUTH EVERYDAY AT BEDTIME, Disp: 90 tablet, Rfl: 1   furosemide  (LASIX ) 20 MG tablet, Take 20 mg by mouth daily., Disp: , Rfl:    inclisiran (LEQVIO ) 284 MG/1.5ML SOSY injection, Inject 284 mg into the skin once., Disp: , Rfl:    latanoprost (XALATAN) 0.005 % ophthalmic solution, 1 drop at bedtime., Disp: , Rfl:    levothyroxine (SYNTHROID) 25 MCG tablet, Take 25 mcg by mouth daily., Disp: , Rfl:    LORazepam (ATIVAN) 1 MG tablet, Take 1 mg by mouth at bedtime as needed (spasms)., Disp: , Rfl:    magnesium  oxide (MAG-OX) 400 MG tablet, Take by mouth as needed., Disp: , Rfl:    meclizine  (ANTIVERT ) 25 MG tablet, TAKE 1 TABLET (25 MG TOTAL) BY MOUTH 2 (TWO) TIMES DAILY AS NEEDED FOR DIZZINESS., Disp: 60 tablet, Rfl: 2   sodium chloride  (OCEAN) 0.65 % SOLN nasal spray, Place 1 spray into both nostrils as needed for congestion., Disp: 37.5 mL, Rfl: 0   Review of Systems Review of Systems  Constitutional:  Negative for chills and fever.  Eyes:  Negative for blurred vision and double vision.  Respiratory:  Negative for cough and shortness of breath.   Cardiovascular:  Negative for chest pain, palpitations and leg swelling.  Gastrointestinal:  Negative for abdominal pain, constipation, diarrhea, heartburn, nausea and vomiting.  Musculoskeletal:  Negative for myalgias.  Neurological:  Negative for dizziness, weakness and headaches.       Objective:    Vitals BP 122/60   Pulse 91   Temp 97.8 F (36.6 C)   Resp 18   Ht 4' 8 (1.422 m)   Wt 99 lb (44.9 kg)   SpO2 99%   BMI 22.20 kg/m    Physical Examination Physical Exam Constitutional:       Appearance: Normal appearance. She is not ill-appearing.  Cardiovascular:  Rate and Rhythm: Normal rate and regular rhythm.     Pulses: Normal pulses.     Heart sounds: No murmur heard.    No friction rub. No gallop.  Pulmonary:     Effort: Pulmonary effort is normal. No respiratory distress.     Breath sounds: No wheezing, rhonchi or rales.  Abdominal:     General: Bowel sounds are normal. There is no distension.     Palpations: Abdomen is soft.     Tenderness: There is no abdominal tenderness.  Musculoskeletal:     Right lower leg: No edema.     Left lower leg: No edema.  Skin:    General: Skin is warm and dry.     Findings: No rash.  Neurological:     Mental Status: She is alert.        Assessment & Plan:   Essential hypertension Her BP is doing well.  WE will continue her current BP meds at this time.  Chronic pain syndrome She has had neck pain for months and she states that over the last few days it has greatly improved with the use of steriods.  She will continue to followup with rheumatology.    No follow-ups on file.   Selinda Fleeta Finger, MD

## 2023-12-07 NOTE — Assessment & Plan Note (Signed)
 Her BP is doing well.  WE will continue her current BP meds at this time.

## 2023-12-08 DIAGNOSIS — M542 Cervicalgia: Secondary | ICD-10-CM | POA: Diagnosis not present

## 2023-12-08 DIAGNOSIS — M4802 Spinal stenosis, cervical region: Secondary | ICD-10-CM | POA: Diagnosis not present

## 2023-12-11 NOTE — Progress Notes (Signed)
Order set

## 2023-12-14 DIAGNOSIS — M4802 Spinal stenosis, cervical region: Secondary | ICD-10-CM | POA: Diagnosis not present

## 2023-12-14 DIAGNOSIS — M542 Cervicalgia: Secondary | ICD-10-CM | POA: Diagnosis not present

## 2023-12-19 DIAGNOSIS — M47812 Spondylosis without myelopathy or radiculopathy, cervical region: Secondary | ICD-10-CM | POA: Diagnosis not present

## 2023-12-19 DIAGNOSIS — M7918 Myalgia, other site: Secondary | ICD-10-CM | POA: Diagnosis not present

## 2023-12-20 DIAGNOSIS — H401134 Primary open-angle glaucoma, bilateral, indeterminate stage: Secondary | ICD-10-CM | POA: Diagnosis not present

## 2023-12-25 DIAGNOSIS — Z5181 Encounter for therapeutic drug level monitoring: Secondary | ICD-10-CM | POA: Diagnosis not present

## 2023-12-25 DIAGNOSIS — E785 Hyperlipidemia, unspecified: Secondary | ICD-10-CM | POA: Diagnosis not present

## 2023-12-26 DIAGNOSIS — F320 Major depressive disorder, single episode, mild: Secondary | ICD-10-CM | POA: Diagnosis not present

## 2023-12-26 LAB — COMPREHENSIVE METABOLIC PANEL WITH GFR
ALT: 20 IU/L (ref 0–32)
AST: 24 IU/L (ref 0–40)
Albumin: 4.7 g/dL (ref 3.8–4.8)
Alkaline Phosphatase: 75 IU/L (ref 49–135)
BUN/Creatinine Ratio: 21 (ref 12–28)
BUN: 21 mg/dL (ref 8–27)
Bilirubin Total: 0.4 mg/dL (ref 0.0–1.2)
CO2: 24 mmol/L (ref 20–29)
Calcium: 10.6 mg/dL — ABNORMAL HIGH (ref 8.7–10.3)
Chloride: 95 mmol/L — ABNORMAL LOW (ref 96–106)
Creatinine, Ser: 0.99 mg/dL (ref 0.57–1.00)
Globulin, Total: 2.6 g/dL (ref 1.5–4.5)
Glucose: 83 mg/dL (ref 70–99)
Potassium: 4.5 mmol/L (ref 3.5–5.2)
Sodium: 136 mmol/L (ref 134–144)
Total Protein: 7.3 g/dL (ref 6.0–8.5)
eGFR: 58 mL/min/1.73 — ABNORMAL LOW (ref >59)

## 2023-12-26 LAB — LIPID PANEL
Chol/HDL Ratio: 1.9 ratio (ref 0.0–4.4)
Cholesterol, Total: 216 mg/dL — ABNORMAL HIGH (ref 100–199)
HDL: 114 mg/dL (ref >39)
LDL Chol Calc (NIH): 77 mg/dL (ref 0–99)
Triglycerides: 152 mg/dL — ABNORMAL HIGH (ref 0–149)
VLDL Cholesterol Cal: 25 mg/dL (ref 5–40)

## 2023-12-27 DIAGNOSIS — M810 Age-related osteoporosis without current pathological fracture: Secondary | ICD-10-CM | POA: Diagnosis not present

## 2023-12-27 DIAGNOSIS — M353 Polymyalgia rheumatica: Secondary | ICD-10-CM | POA: Diagnosis not present

## 2023-12-27 DIAGNOSIS — Z6821 Body mass index (BMI) 21.0-21.9, adult: Secondary | ICD-10-CM | POA: Diagnosis not present

## 2023-12-27 DIAGNOSIS — M5030 Other cervical disc degeneration, unspecified cervical region: Secondary | ICD-10-CM | POA: Diagnosis not present

## 2023-12-27 DIAGNOSIS — R42 Dizziness and giddiness: Secondary | ICD-10-CM | POA: Diagnosis not present

## 2023-12-27 DIAGNOSIS — M5136 Other intervertebral disc degeneration, lumbar region with discogenic back pain only: Secondary | ICD-10-CM | POA: Diagnosis not present

## 2023-12-27 DIAGNOSIS — M25551 Pain in right hip: Secondary | ICD-10-CM | POA: Diagnosis not present

## 2023-12-27 DIAGNOSIS — M1991 Primary osteoarthritis, unspecified site: Secondary | ICD-10-CM | POA: Diagnosis not present

## 2023-12-27 DIAGNOSIS — M654 Radial styloid tenosynovitis [de Quervain]: Secondary | ICD-10-CM | POA: Diagnosis not present

## 2023-12-29 ENCOUNTER — Ambulatory Visit (HOSPITAL_BASED_OUTPATIENT_CLINIC_OR_DEPARTMENT_OTHER): Payer: Self-pay | Admitting: Family

## 2024-01-20 ENCOUNTER — Other Ambulatory Visit: Payer: Self-pay | Admitting: Allergy & Immunology

## 2024-01-22 ENCOUNTER — Encounter: Payer: Self-pay | Admitting: Physician Assistant

## 2024-01-22 ENCOUNTER — Encounter

## 2024-01-29 NOTE — Progress Notes (Signed)
 "   Mild Cognitive Impairment of multiple etiologies   Amanda Cannon is a very pleasant 79 y.o. RH female with a history of  hypertension, hyperlipidemia, history of hyponatremia, cervical radiculopathy, HOH, PMR (sees rheumatologist) anxiety, history of breast cancer, insomnia, hypothyroidism, anemia, GERD, hypothyroidism, multiple allergies and a diagnosis of mild cognitive impairment of multiple etiologies per neuropsych evaluation October 2025 It remains plausible that her current testing profile is a combination of chronic medical illness, chronic pain, sleep disruption, and psychiatric distress. Polypharmacy could also be playing a small role, namely surrounding cognitive side effects associated with lorazepam/Ativan and meclizine /Antivert .  She is presenting today in follow-up to discuss the results of her recent neurocognitive testing Patient was last seen on 11/07/2023.   Patient is able to participate on ADLs. Mood is improved since symptoms of PMR are improved after initiation of prednisone She reports that her memory is way improved as well, husband agrees. Previous records as well as any outside records available were reviewed prior to todays visit   Repeat neuropsych evaluation in 12 to 18 months for diagnostic clarity   Follow up with psychiatry for anxiety Replenish B1 Continue to control mood as per PCP Recommend good control of cardiovascular risk factors No follow up or antidementia medication is indicated at this time    Discussed the use of AI scribe software for clinical note transcription with the patient, who gave verbal consent to proceed.  History of Present Illness Amanda Cannon is a 79 year old female who presents for follow-up after her neurocognitive testing.   She has experienced significant improvement in memory and cognitive function since her last visit, attributing this to the resolution of severe pain and the initiation of prednisone for polymyalgia  rheumatica. Her memory has improved to the extent that she can recall a doctor's name and facility from two years ago, which her family could not remember.  She has been taking prednisone as prescribed, which has significantly improved her symptoms of polymyalgia rheumatica. She feels much better since starting the medication.  She continues to take lorazepam once daily, which she has been on since age 73, and reports no issues with this medication. She also takes magnesium  nightly to aid with bowel movements, which are regular.  No recent urinary tract infections and urination is normal. No hallucinations, paranoia, or seizures since discontinuing a previous pain medication that caused adverse effects, including hallucinations and sleep disruption.  She has resumed driving and reports no issues with getting lost or recognizing her surroundings. Her mood is described as 'incredibly good,' and she feels happier overall. She engages in activities such as crossword puzzles and listening to music, which she enjoys.  She reports a vestibular problem that has not been resolved despite consultations at other centers. She experiences occasional  hearing difficulties, which she finds frustrating.  She has been supplementing with a B complex due to a previously low vitamin B1 level. She has reduced her alcohol intake, consuming only small amounts of bourbon and wine occasionally.  Initial visit 11/06/23 How long did patient have memory difficulties?  For the last 4-5 moths. Started with my neck, my arthritis. She reports word finding difficulties, and unable to take very fluently.  Patient reports some difficulty remembering new information, recent conversations, names. LTM is good. Likes crossword puzzles, other brain stimulating exercises, listening to the 50s music. Repeats oneself?  Endorsed  Disoriented when walking into a room? Denies    Leaving objects in unusual places?  Denies.   Wandering  behavior? Denies.   Any personality changes, or depression, anxiety? Has a history of anxiety. I am less happy, concerned about my memory.   Hallucinations or paranoia?  Denies  Seizures? Denies.    Any sleep changes?  Sleeps well. Denies frequent nightmares or dream reenactment, other REM behavior or sleepwalking   Sleep apnea? Denies.   Any hygiene concerns?  Denies.   Independent of bathing and dressing? Endorsed  Does the patient need help with medications?  Husband is in charge of the daytime meds he likes it, she is in charge of the night medicines   Who is in charge of the finances? Husband is in charge     Any changes in appetite? Marikay is not an eater, eats small portions.     Patient have trouble swallowing?  Denies.   Does the patient cook? Yes.She is an excellent cook-husband says.  Denies forgetting common recipes or kitchen accidents   Any headaches?  The patient had a presentation to the ED on 10/23/2023 with acute non-intractable headache with MRI of the brain negative for acute findings. Chronic pain?  Chronic neck pain radiating to both shoulders for the last 3 months, with limited range of motion, pain when turning her head or riding in the car or trying to sleep.  The pain is stabbing and she has intermittent shooting pains down her arms.  She has taken Cymbalta , gabapentin , meloxicam , naproxen, oxycodone , and has received 2 injections 1 in August and 1 in July today left C7-T1. Following OP specialist. Ambulates with difficulty?  She has been shuffling her feet but doing better-husband says Recent falls or head injuries? Denies.    Vision changes?  Denies any new issues.    Any strokelike symptoms? Denies.   Any tremors? Only when nervous  Any anosmia? Denies.   Any incontinence of urine? Denies.   Any bowel dysfunction? Denies.      Patient lives with her husband History of heavy alcohol intake?  1 bourbon a day, maybe 2 sometimes History of heavy tobacco  use? Denies.   Family history of dementia?  Denies    Does patient drive? yes, denies getting lost.   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        Pertinent labs 10/23/2023: CRP less than 0.5, ESR 62, PT and PTT normal, normal CBC, TSH 4.010, TC 204, TG 213   MRI brain 10/22/2023, with and without contrast personally reviewed personally reviewed:  remarkable for abnormal LIJV sinus flow-void concerning for venous sinus thrombosis, MRV in progress.     C-spine MRI with and without contrast 10/14/2023:  Multilevel moderate foraminal stenosis, and progression of retrolisthesis at C4-C5 and C5-C6, grade 1, anterolisthesis at C6-7 and C7-T1.      Neuropsych evaluation 11/15/2023, Dr. Richie Briefly, results suggested impairment surrounding processing speed, receptive language, expressive language (i.e., both verbal fluency and confrontation naming), and encoding (i.e., learning) aspects of verbal memory. Further weakness/variability was exhibited across executive functioning, visuospatial abilities, and delayed retrieval aspects of memory. The cause for ongoing cognitive impairment is unclear as her testing profile is nonspecific in nature. She described an abrupt onset of immense neck and shoulder pain about 3-5 months prior. Symptoms were said to acutely worsen sleep and create regular headache experiences. While symptoms have improved over time, some discomfort remains. Furthermore, past neuroimaging suggested microvascular ischemic disease, recent labs suggested a low B1 (thiamine ) level and medical records suggest her recently reporting two  bourbon drinks nightly, and she described acute anxiety surrounding testing procedures. It remains plausible that her current testing profile is a combination of chronic medical illness, chronic pain, sleep disruption, and psychiatric distress. Polypharmacy could also be playing a small role, namely surrounding cognitive side effects associated with  lorazepam/Ativan and meclizine /Antivert .   Past Medical History:  Diagnosis Date   Acquired hypothyroidism 05/12/2023   Age-related osteoporosis without current pathological fracture 01/15/2018   On Prolia  every 6 months along with calcium  and vitamin D    Anemia    Anxiety    Aortic atherosclerosis 03/09/2023   Asthma    Basal cell carcinoma    forehead   Bilateral hearing loss 03/23/2018   Breast pain, left 01/15/2018   Cervical pain 09/28/2023   Cervical radiculopathy 09/28/2023   Cervical spondylosis 09/14/2023   Colon polyps    Coronary artery disease involving native coronary artery of native heart without angina pectoris 03/09/2023   DDD (degenerative disc disease), cervical 06/22/2021   Depression 07/15/2015   Esophageal spasm 07/15/2015   Essential hypertension 07/15/2015   ETD (Eustachian tube dysfunction), bilateral 03/23/2018   Facial numbness 03/23/2018   Gastroesophageal reflux disease 08/06/2019   Glaucoma    History of tamoxifen therapy    Hypercalcemia 05/03/2022   Hyperlipidemia    Hyponatremia    remote, mild   Insomnia 03/09/2023   Malignant neoplasm of lower-outer quadrant of left breast of female, estrogen receptor positive 1998   Mild neurocognitive disorder due to multiple etiologies 11/15/2023   Mitral valve prolapse    Monocytosis 05/11/2023   Osteoarthritis    sees rheumatologist   Osteoporosis    sees rheumatologist   Personal history of radiation therapy 1998   PMR (polymyalgia rheumatica)    sees rheumatologist   Pneumonia    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   Prinzmetal angina    Seasonal allergic rhinitis 05/02/2023   Stenosis of carotid artery 03/09/2023   Tinnitus 07/15/2015   Trigeminal neuropathy 11/13/2017   Vertigo 07/17/2018   Vestibular disequilibrium 09/06/2021     Past Surgical History:  Procedure Laterality Date   ABDOMINAL HYSTERECTOMY     BREAST LUMPECTOMY Left 1998   cancer   CESAREAN SECTION     x 4    COLONOSCOPY  07/2012   in [facility]   HERNIA REPAIR  2005   abdominal hernia repair   HYSTEROTOMY     LYMPH NODE DISSECTION Left    POLYPECTOMY            11/06/2023   12:00 PM  Montreal Cognitive Assessment   Visuospatial/ Executive (0/5) 1  Naming (0/3) 3  Attention: Read list of digits (0/2) 2  Attention: Read list of letters (0/1) 1  Attention: Serial 7 subtraction starting at 100 (0/3) 0  Language: Repeat phrase (0/2) 0  Language : Fluency (0/1) 0  Abstraction (0/2) 1  Delayed Recall (0/5) 1  Orientation (0/6) 5  Total 14  Adjusted Score (based on education) 14       11/09/2017    8:57 AM 11/08/2016    8:55 AM  MMSE - Mini Mental State Exam  Not completed: -- --      Thank you for allowing us  the opportunity to participate in the care of this nice patient. Please do not hesitate to contact us  for any questions or concerns.   Total time spent on today's visit was 34 minutes dedicated to this patient today, preparing to see patient, examining the patient, ordering tests  and/or medications and counseling the patient, documenting clinical information in the EHR or other health record, independently interpreting results and communicating results to the patient/family, discussing treatment and goals, answering patient's questions and coordinating care.  Cc:  Fleeta Valeria Mayo, MD  Camie Sevin 01/29/2024 5:44 AM      "

## 2024-01-31 ENCOUNTER — Ambulatory Visit: Payer: Self-pay | Admitting: Physician Assistant

## 2024-01-31 VITALS — BP 107/70 | HR 100 | Wt 101.6 lb

## 2024-01-31 DIAGNOSIS — F0670 Mild neurocognitive disorder due to known physiological condition without behavioral disturbance: Secondary | ICD-10-CM | POA: Diagnosis not present

## 2024-02-01 ENCOUNTER — Telehealth: Payer: Self-pay

## 2024-02-01 NOTE — Telephone Encounter (Signed)
 Auth Submission: NO AUTH NEEDED Site of care: Site of care: [facility] Payer: Medicare A/B with AARP supplement Medication & CPT/J Code(s) submitted: Leqvio  (Inclisiran) J1306 Diagnosis Code:  Route of submission (phone, fax, portal):  Phone # Fax # Auth type: Buy/Bill PB Units/visits requested: 284mg  x 2 doses Reference number:  Approval from: 02/01/24 to 02/23/25

## 2024-02-20 ENCOUNTER — Emergency Department (HOSPITAL_BASED_OUTPATIENT_CLINIC_OR_DEPARTMENT_OTHER)
Admission: EM | Admit: 2024-02-20 | Discharge: 2024-02-20 | Disposition: A | Discharge status: Home / Self Care | Attending: Emergency Medicine | Admitting: Emergency Medicine

## 2024-02-20 ENCOUNTER — Other Ambulatory Visit: Payer: Self-pay

## 2024-02-20 ENCOUNTER — Emergency Department (HOSPITAL_BASED_OUTPATIENT_CLINIC_OR_DEPARTMENT_OTHER)

## 2024-02-20 ENCOUNTER — Encounter (HOSPITAL_BASED_OUTPATIENT_CLINIC_OR_DEPARTMENT_OTHER): Payer: Self-pay | Admitting: Emergency Medicine

## 2024-02-20 DIAGNOSIS — S0083XA Contusion of other part of head, initial encounter: Secondary | ICD-10-CM | POA: Diagnosis not present

## 2024-02-20 DIAGNOSIS — F0390 Unspecified dementia without behavioral disturbance: Secondary | ICD-10-CM | POA: Insufficient documentation

## 2024-02-20 DIAGNOSIS — S60042A Contusion of left ring finger without damage to nail, initial encounter: Secondary | ICD-10-CM | POA: Diagnosis not present

## 2024-02-20 DIAGNOSIS — S0990XA Unspecified injury of head, initial encounter: Secondary | ICD-10-CM

## 2024-02-20 DIAGNOSIS — S60052A Contusion of left little finger without damage to nail, initial encounter: Secondary | ICD-10-CM | POA: Diagnosis not present

## 2024-02-20 DIAGNOSIS — M7989 Other specified soft tissue disorders: Secondary | ICD-10-CM | POA: Diagnosis not present

## 2024-02-20 DIAGNOSIS — Z7982 Long term (current) use of aspirin: Secondary | ICD-10-CM | POA: Insufficient documentation

## 2024-02-20 DIAGNOSIS — W19XXXA Unspecified fall, initial encounter: Secondary | ICD-10-CM | POA: Insufficient documentation

## 2024-02-20 NOTE — Discharge Instructions (Signed)
 You have been seen and discharged from the emergency department.  The CT imaging of your head, face and cervical spine showed no injury.  Your left hand was wrapped with an Ace wrap for support.  Follow-up with your primary provider for further evaluation and further care. Take home medications as prescribed. If you have any worsening symptoms or further concerns for your health please return to an emergency department for further evaluation.

## 2024-02-20 NOTE — ED Triage Notes (Addendum)
 Pt endorses mechanical fall on ice this morning. Was eval. At UC, c/o LT hand pain, facial pain and neck pain. Denies loc. Was referred for head and neck CT. Hand XR at UC was neg per pt. AOx4. Denies thinners

## 2024-02-20 NOTE — ED Notes (Signed)

## 2024-02-20 NOTE — ED Provider Notes (Signed)
 " [facility] AT [facility] Provider Note   CSN: [id number] Arrival date & time: 02/20/24  1020     Patient presents with: [name]   Amanda Cannon is a 79 y.o. female.   HPI   79 year old female presents emergency department with mechanical fall on ice this morning.  Was originally evaluated at urgent care where x-rays of the left hand showed no fracture but they referred her here for concern of head injury and need for CT.  Patient and family deny LOC.  She is currently complaining of left hand pain and maybe a mild headache which is otherwise very nonspecific.  No vision changes or neurodeficit.  No syncope.  Prior to Admission medications  Medication Sig Start Date End Date Taking? Authorizing Provider  amLODipine  (NORVASC ) 5 MG tablet TAKE 1 TABLET (5 MG TOTAL) BY MOUTH DAILY. 05/30/23   Amin, Saad, MD  aspirin 81 MG tablet Take 81 mg by mouth daily.    [provider]  Cholecalciferol 25 MCG (1000 UT) tablet Take by mouth.    [provider]  denosumab  (PROLIA ) 60 MG/ML SOLN injection Inject 60 mg into the skin every 6 (six) months. Administer in upper arm, thigh, or abdomen    [provider]  dorzolamide (TRUSOPT) 2 % ophthalmic solution Apply to eye. 11/14/22   [provider]  DULoxetine  (CYMBALTA ) 30 MG capsule TAKE 1 CAPSULE BY MOUTH EVERY DAY 10/23/23   Fleeta Finger, Selinda, MD  ezetimibe  (ZETIA ) 10 MG tablet Take 1 tablet (10 mg total) by mouth daily. 06/08/23 06/07/24  Fleeta Finger Selinda, MD  famotidine  (PEPCID ) 40 MG tablet TAKE 1 TABLET BY MOUTH EVERYDAY AT BEDTIME 01/22/24   Iva Marty Saltness, MD  furosemide  (LASIX ) 20 MG tablet Take 20 mg by mouth daily.    [provider]  inclisiran (LEQVIO ) 284 MG/1.5ML SOSY injection Inject 284 mg into the skin once.    [provider]  latanoprost (XALATAN) 0.005 % ophthalmic solution 1 drop at bedtime. 12/09/21   [provider]  levothyroxine  (SYNTHROID) 25 MCG tablet Take 25 mcg by mouth daily.    [provider]  LORazepam (ATIVAN) 1 MG tablet Take 1 mg by mouth at bedtime as needed (spasms).    [provider]  magnesium  oxide (MAG-OX) 400 MG tablet Take by mouth as needed.    [provider]  meclizine  (ANTIVERT ) 25 MG tablet TAKE 1 TABLET (25 MG TOTAL) BY MOUTH 2 (TWO) TIMES DAILY AS NEEDED FOR DIZZINESS. 10/27/23   Amin, Saad, MD  predniSONE (DELTASONE) 5 MG tablet Take 10 mg by mouth daily.    [provider]  sodium chloride  (OCEAN) 0.65 % SOLN nasal spray Place 1 spray into both nostrils as needed for congestion. 05/02/23   Amin, Saad, MD    Allergies: Acetaminophen, Beta adrenergic blockers, Tetrahydrozoline-zn sulfate, Erythromycin, Erythromycin base, Other, Sulfa antibiotics, Turmeric, Betamethasone, and Oxycodone -acetaminophen    Review of Systems  Constitutional:  Negative for fever.  Respiratory:  Negative for shortness of breath.   Cardiovascular:  Negative for chest pain.  Gastrointestinal:  Negative for abdominal pain, diarrhea and vomiting.  Musculoskeletal:        + Left hand pain  Skin:  Negative for rash.  Neurological:  Positive for headaches. Negative for syncope and weakness.    Updated Vital Signs BP (!) 149/110   Pulse 93   Temp 98.1 F (36.7 C) (Oral)   Resp 16   Wt 44.9  kg   SpO2 99%   BMI 22.20 kg/m   Physical Exam Vitals and nursing note reviewed.  Constitutional:      General: She is not in acute distress.    Appearance: Normal appearance.  HENT:     Head: Normocephalic.     Comments: Mild bruising to the left side of the face    Mouth/Throat:     Mouth: Mucous membranes are moist.  Eyes:     Extraocular Movements: Extraocular movements intact.     Pupils: Pupils are equal, round, and reactive to light.  Cardiovascular:     Rate and Rhythm: Normal rate.  Pulmonary:     Effort: Pulmonary effort is normal. No respiratory distress.  Abdominal:      Palpations: Abdomen is soft.     Tenderness: There is no abdominal tenderness.  Musculoskeletal:     Cervical back: No rigidity.     Comments: Mild bruising along the left 4th and 5th metacarpal  Skin:    General: Skin is warm.  Neurological:     Mental Status: She is alert and oriented to person, place, and time. Mental status is at baseline.  Psychiatric:        Mood and Affect: Mood normal.     (all labs ordered are listed, but only abnormal results are displayed) Labs Reviewed - No data to display  EKG: None  Radiology: CT Cervical Spine Wo Contrast Result Date: 02/20/2024 EXAM: CT CERVICAL SPINE WITHOUT CONTRAST 02/20/2024 12:46:47 PM TECHNIQUE: CT of the cervical spine was performed without the administration of intravenous contrast. Multiplanar reformatted images are provided for review. Automated exposure control, iterative reconstruction, and/or weight based adjustment of the mA/kV was utilized to reduce the radiation dose to as low as reasonably achievable. COMPARISON: Cervical spine radiograph 09/21/2023. CLINICAL HISTORY: Polytrauma, blunt. FINDINGS: BONES AND ALIGNMENT: Cervical lordosis is maintained. Similar degenerative anterolisthesis of C2 on C3. There is trace degenerative retrolisthesis of C3 on C4 and C4 on C5. Additional trace stepwise degenerative anterolisthesis of C6 on C7 and C7 on T1. No evidence of traumatic malalignment. Bilateral cervical ribs noted at C7. Asymmetric degenerative changes at the right atlantoaxial articulation. DEGENERATIVE CHANGES: Moderate posterior disc space narrowing at C3-C4 and C4-C5. Moderate disc space narrowing at C5-C6, C6-C7, and C7-T1. Disc osteophyte complexes at multiple levels. Mild spinal canal stenosis at C3-C4 and C4-C5. No high grade osseous spinal canal stenosis. Facet arthrosis and uncovertebral hypertrophy throughout the cervical spine. There is significant foraminal stenosis at multiple levels, particularly at C3-C4 and  C4-C5. SOFT TISSUES: No prevertebral soft tissue swelling. Atherosclerosis at the left carotid bifurcation. Biapical pleural scarring. IMPRESSION: 1. No evidence of acute traumatic injury. 2. Multilevel degenerative changes with mild spinal canal stenosis at C3-C4 and C4-C5 and significant foraminal stenosis, greatest at C3-C4 and C4-C5. Electronically signed by: Donnice Mania MD 02/20/2024 01:16 PM EST RP Workstation: [id number]   CT Maxillofacial WO CM Result Date: 02/20/2024 EXAM: CT OF THE FACE WITHOUT CONTRAST 02/20/2024 12:46:47 PM TECHNIQUE: CT of the face was performed without the administration of intravenous contrast. Multiplanar reformatted images are provided for review. Automated exposure control, iterative reconstruction, and/or weight based adjustment of the mA/kV was utilized to reduce the radiation dose to as low as reasonably achievable. COMPARISON: None available. CLINICAL HISTORY: Polytrauma, blunt. FINDINGS: ARTIFACTS: There is streak artifact from dental amalgam/dental hardware. FACIAL BONES: Mild chronic bilateral nasal bone deformities. There is no evidence of acute maxillofacial fracture. Degenerative changes of the temporomandibular joints, more  pronounced on the right with significant joint space narrowing on the right. Mild leftward deviation of the nasal septum. No mandibular dislocation. No suspicious bone lesion. ORBITS: Bilateral lens replacement. Globes are intact. No acute traumatic injury. No inflammatory change. SINUSES AND MASTOIDS: Mild mucosal thickening in the alveolar recess of the right maxillary sinus. No acute abnormality in the remaining visualized sinuses and mastoids. SOFT TISSUES: No acute abnormality. IMPRESSION: 1. No acute maxillofacial fracture. Electronically signed by: Donnice Mania MD 02/20/2024 01:12 PM EST RP Workstation: [id number]   CT Head Wo Contrast Result Date: 02/20/2024 EXAM: CT Head Without Contrast 02/20/2024 12:46:47 PM TECHNIQUE: CT of the head  was performed without the administration of intravenous contrast. Automated exposure control, iterative reconstruction, and/or weight-based adjustment of the mA/kV was utilized to reduce the radiation dose to as low as reasonably achievable. COMPARISON: CT Head dated 10/22/2023. CLINICAL HISTORY: Polytrauma, blunt. FINDINGS: BRAIN AND VENTRICLES: Similar appearance of mild age-related volume loss, moderate chronic microvascular ischemic changes. No acute hemorrhage. No evidence of acute infarct. No hydrocephalus. No extra-axial collection. No mass effect or midline shift. ORBITS: Status post bilateral lens replacement. SINUSES: No acute abnormality. SOFT TISSUES AND SKULL: Moderate calcific atheromatous disease within carotid siphons. No acute soft tissue abnormality. No skull fracture. IMPRESSION: 1. No acute intracranial abnormality. Electronically signed by: Donnice Mania MD 02/20/2024 01:08 PM EST RP Workstation: [id number]     Procedures   Medications Ordered in the ED - No data to display                                  Medical Decision Making Amount and/or Complexity of Data Reviewed Radiology: ordered.   79 year old female presents emergency department with mechanical fall, slipped on ice.  Sustained a left hand injury with self-reported negative x-rays at urgent care, Ace wrap placed by us .  I am unable to see this report, repeat x-ray offered and declined.SABRA Spear here with mild left-sided facial bruising, concern for need for CT rule out.  CT imaging shows no acute finding.  Patient has baseline dementia and intermittent confusion but otherwise seems stable for discharge with family member at bedside.  Patient at this time appears safe and stable for discharge and close outpatient follow up. Discharge plan and strict return to ED precautions discussed, patient verbalizes understanding and agreement.     Final diagnoses:  Fall, initial encounter  Injury of head, initial encounter     ED Discharge Orders     None          Bari Roxie HERO, DO 02/20/24 1448  "

## 2024-02-26 ENCOUNTER — Ambulatory Visit

## 2024-02-27 ENCOUNTER — Ambulatory Visit: Payer: Medicare Other | Admitting: Allergy & Immunology

## 2024-02-28 ENCOUNTER — Ambulatory Visit

## 2024-02-28 VITALS — BP 129/79 | HR 97 | Temp 97.5°F | Resp 18 | Ht <= 58 in | Wt 102.4 lb

## 2024-02-28 DIAGNOSIS — E782 Mixed hyperlipidemia: Secondary | ICD-10-CM | POA: Diagnosis not present

## 2024-02-28 MED ORDER — INCLISIRAN SODIUM 284 MG/1.5ML ~~LOC~~ SOSY
284.0000 mg | PREFILLED_SYRINGE | Freq: Once | SUBCUTANEOUS | Status: AC
  Administered 2024-02-28: 284 mg via SUBCUTANEOUS
  Filled 2024-02-28: qty 1.5

## 2024-02-28 NOTE — Progress Notes (Signed)
 Diagnosis: Hyperlipidemia  Provider:  Chilton Greathouse MD  Procedure: Injection  Leqvio (inclisiran), Dose: 284 mg, Site: subcutaneous, Number of injections: 1  Injection Site(s): Right arm  Post Care: Patient declined observation  Discharge: Condition: Good, Destination: Home . AVS Provided  Performed by:  Adriana Mccallum, RN

## 2024-05-07 ENCOUNTER — Ambulatory Visit: Admitting: Allergy & Immunology

## 2024-08-28 ENCOUNTER — Ambulatory Visit
